# Patient Record
Sex: Female | Born: 1955 | Race: Black or African American | Hispanic: No | Smoking: Former smoker
Health system: Southern US, Community
[De-identification: ages and names within clinical notes are randomized; demographics above are authoritative.]

## PROBLEM LIST (undated history)

## (undated) DIAGNOSIS — I471 Supraventricular tachycardia, unspecified: Secondary | ICD-10-CM

## (undated) DIAGNOSIS — K219 Gastro-esophageal reflux disease without esophagitis: Secondary | ICD-10-CM

## (undated) DIAGNOSIS — D509 Iron deficiency anemia, unspecified: Secondary | ICD-10-CM

## (undated) DIAGNOSIS — F32A Depression, unspecified: Secondary | ICD-10-CM

## (undated) DIAGNOSIS — I872 Venous insufficiency (chronic) (peripheral): Secondary | ICD-10-CM

## (undated) DIAGNOSIS — F329 Major depressive disorder, single episode, unspecified: Secondary | ICD-10-CM

## (undated) DIAGNOSIS — I10 Essential (primary) hypertension: Secondary | ICD-10-CM

## (undated) DIAGNOSIS — Z8679 Personal history of other diseases of the circulatory system: Secondary | ICD-10-CM

## (undated) DIAGNOSIS — I639 Cerebral infarction, unspecified: Secondary | ICD-10-CM

## (undated) DIAGNOSIS — M8580 Other specified disorders of bone density and structure, unspecified site: Secondary | ICD-10-CM

## (undated) DIAGNOSIS — R569 Unspecified convulsions: Secondary | ICD-10-CM

## (undated) DIAGNOSIS — M329 Systemic lupus erythematosus, unspecified: Secondary | ICD-10-CM

## (undated) HISTORY — DX: Major depressive disorder, single episode, unspecified: F32.9

## (undated) HISTORY — DX: Unspecified convulsions: R56.9

## (undated) HISTORY — DX: Essential (primary) hypertension: I10

## (undated) HISTORY — DX: Depression, unspecified: F32.A

## (undated) HISTORY — DX: Cerebral infarction, unspecified: I63.9

## (undated) HISTORY — DX: Systemic lupus erythematosus, unspecified: M32.9

## (undated) HISTORY — PX: CEREBRAL ANEURYSM REPAIR: SHX164

## (undated) HISTORY — DX: Personal history of other diseases of the circulatory system: Z86.79

## (undated) HISTORY — DX: Gastro-esophageal reflux disease without esophagitis: K21.9

## (undated) HISTORY — DX: Iron deficiency anemia, unspecified: D50.9

## (undated) HISTORY — DX: Venous insufficiency (chronic) (peripheral): I87.2

## (undated) HISTORY — DX: Other specified disorders of bone density and structure, unspecified site: M85.80

---

## 1989-05-18 DIAGNOSIS — I639 Cerebral infarction, unspecified: Secondary | ICD-10-CM

## 1989-05-18 HISTORY — DX: Cerebral infarction, unspecified: I63.9

## 1997-12-21 ENCOUNTER — Encounter: Admission: RE | Admit: 1997-12-21 | Discharge: 1997-12-21 | Payer: Self-pay | Admitting: Internal Medicine

## 1997-12-21 ENCOUNTER — Other Ambulatory Visit: Admission: RE | Admit: 1997-12-21 | Discharge: 1997-12-21 | Payer: Self-pay | Admitting: Internal Medicine

## 1999-03-03 ENCOUNTER — Encounter: Admission: RE | Admit: 1999-03-03 | Discharge: 1999-03-03 | Payer: Self-pay | Admitting: Internal Medicine

## 1999-05-19 HISTORY — PX: ESOPHAGOGASTRODUODENOSCOPY: SHX1529

## 2000-03-23 ENCOUNTER — Emergency Department (HOSPITAL_COMMUNITY): Admission: EM | Admit: 2000-03-23 | Discharge: 2000-03-23 | Payer: Self-pay | Admitting: Emergency Medicine

## 2000-03-23 ENCOUNTER — Encounter: Payer: Self-pay | Admitting: Emergency Medicine

## 2000-04-08 ENCOUNTER — Encounter: Payer: Self-pay | Admitting: Emergency Medicine

## 2000-04-08 ENCOUNTER — Encounter (INDEPENDENT_AMBULATORY_CARE_PROVIDER_SITE_OTHER): Payer: Self-pay | Admitting: *Deleted

## 2000-04-09 ENCOUNTER — Inpatient Hospital Stay (HOSPITAL_COMMUNITY): Admission: EM | Admit: 2000-04-09 | Discharge: 2000-04-15 | Payer: Self-pay | Admitting: Emergency Medicine

## 2000-04-10 ENCOUNTER — Encounter: Payer: Self-pay | Admitting: Internal Medicine

## 2000-04-11 ENCOUNTER — Encounter: Payer: Self-pay | Admitting: Internal Medicine

## 2000-04-12 ENCOUNTER — Encounter: Payer: Self-pay | Admitting: Internal Medicine

## 2000-04-13 ENCOUNTER — Encounter: Payer: Self-pay | Admitting: Internal Medicine

## 2000-04-14 ENCOUNTER — Encounter: Payer: Self-pay | Admitting: Internal Medicine

## 2000-04-29 ENCOUNTER — Encounter (INDEPENDENT_AMBULATORY_CARE_PROVIDER_SITE_OTHER): Payer: Self-pay | Admitting: *Deleted

## 2000-04-29 ENCOUNTER — Encounter: Admission: RE | Admit: 2000-04-29 | Discharge: 2000-04-29 | Payer: Self-pay | Admitting: Hematology and Oncology

## 2000-04-29 ENCOUNTER — Inpatient Hospital Stay (HOSPITAL_COMMUNITY): Admission: RE | Admit: 2000-04-29 | Discharge: 2000-05-25 | Payer: Self-pay | Admitting: Hematology and Oncology

## 2000-05-02 ENCOUNTER — Encounter: Payer: Self-pay | Admitting: Pulmonary Disease

## 2000-05-02 ENCOUNTER — Encounter: Payer: Self-pay | Admitting: *Deleted

## 2000-05-04 ENCOUNTER — Encounter: Payer: Self-pay | Admitting: Thoracic Surgery

## 2000-05-09 ENCOUNTER — Encounter: Payer: Self-pay | Admitting: Cardiology

## 2000-05-10 ENCOUNTER — Encounter: Payer: Self-pay | Admitting: Thoracic Surgery

## 2000-05-18 ENCOUNTER — Encounter: Payer: Self-pay | Admitting: Critical Care Medicine

## 2000-05-20 ENCOUNTER — Encounter: Payer: Self-pay | Admitting: Gastroenterology

## 2000-06-02 ENCOUNTER — Encounter: Admission: RE | Admit: 2000-06-02 | Discharge: 2000-06-02 | Payer: Self-pay | Admitting: Internal Medicine

## 2000-06-16 ENCOUNTER — Encounter: Payer: Self-pay | Admitting: Thoracic Surgery

## 2000-06-16 ENCOUNTER — Encounter: Admission: RE | Admit: 2000-06-16 | Discharge: 2000-06-16 | Payer: Self-pay | Admitting: Thoracic Surgery

## 2000-06-18 ENCOUNTER — Encounter: Payer: Self-pay | Admitting: Infectious Diseases

## 2000-06-18 ENCOUNTER — Inpatient Hospital Stay (HOSPITAL_COMMUNITY): Admission: EM | Admit: 2000-06-18 | Discharge: 2000-06-19 | Payer: Self-pay | Admitting: *Deleted

## 2000-07-01 ENCOUNTER — Encounter: Admission: RE | Admit: 2000-07-01 | Discharge: 2000-07-01 | Payer: Self-pay

## 2000-07-28 ENCOUNTER — Encounter: Payer: Self-pay | Admitting: Thoracic Surgery

## 2000-07-28 ENCOUNTER — Encounter: Admission: RE | Admit: 2000-07-28 | Discharge: 2000-07-28 | Payer: Self-pay | Admitting: Thoracic Surgery

## 2001-04-13 ENCOUNTER — Encounter: Admission: RE | Admit: 2001-04-13 | Discharge: 2001-04-13 | Payer: Self-pay | Admitting: Internal Medicine

## 2001-04-27 ENCOUNTER — Encounter: Admission: RE | Admit: 2001-04-27 | Discharge: 2001-04-27 | Payer: Self-pay | Admitting: Internal Medicine

## 2001-04-29 ENCOUNTER — Encounter: Payer: Self-pay | Admitting: *Deleted

## 2001-04-29 ENCOUNTER — Ambulatory Visit (HOSPITAL_COMMUNITY): Admission: RE | Admit: 2001-04-29 | Discharge: 2001-04-29 | Payer: Self-pay | Admitting: *Deleted

## 2001-05-02 ENCOUNTER — Encounter: Admission: RE | Admit: 2001-05-02 | Discharge: 2001-05-02 | Payer: Self-pay | Admitting: Internal Medicine

## 2001-05-26 ENCOUNTER — Encounter: Admission: RE | Admit: 2001-05-26 | Discharge: 2001-05-26 | Payer: Self-pay | Admitting: Internal Medicine

## 2001-06-07 ENCOUNTER — Encounter: Admission: RE | Admit: 2001-06-07 | Discharge: 2001-06-07 | Payer: Self-pay | Admitting: Internal Medicine

## 2001-06-07 ENCOUNTER — Encounter: Payer: Self-pay | Admitting: Internal Medicine

## 2001-06-07 ENCOUNTER — Ambulatory Visit (HOSPITAL_COMMUNITY): Admission: RE | Admit: 2001-06-07 | Discharge: 2001-06-07 | Payer: Self-pay

## 2001-06-08 ENCOUNTER — Emergency Department (HOSPITAL_COMMUNITY): Admission: EM | Admit: 2001-06-08 | Discharge: 2001-06-08 | Payer: Self-pay | Admitting: Emergency Medicine

## 2001-06-13 ENCOUNTER — Encounter: Admission: RE | Admit: 2001-06-13 | Discharge: 2001-06-13 | Payer: Self-pay | Admitting: Internal Medicine

## 2001-07-13 ENCOUNTER — Ambulatory Visit (HOSPITAL_COMMUNITY): Admission: RE | Admit: 2001-07-13 | Discharge: 2001-07-13 | Payer: Self-pay | Admitting: Gastroenterology

## 2001-08-02 ENCOUNTER — Encounter: Admission: RE | Admit: 2001-08-02 | Discharge: 2001-08-02 | Payer: Self-pay | Admitting: Hematology & Oncology

## 2001-08-09 ENCOUNTER — Encounter: Admission: RE | Admit: 2001-08-09 | Discharge: 2001-08-09 | Payer: Self-pay | Admitting: Internal Medicine

## 2001-10-20 ENCOUNTER — Emergency Department (HOSPITAL_COMMUNITY): Admission: EM | Admit: 2001-10-20 | Discharge: 2001-10-20 | Payer: Self-pay | Admitting: Emergency Medicine

## 2001-10-24 ENCOUNTER — Encounter: Admission: RE | Admit: 2001-10-24 | Discharge: 2001-11-10 | Payer: Self-pay | Admitting: Internal Medicine

## 2001-11-08 ENCOUNTER — Encounter: Admission: RE | Admit: 2001-11-08 | Discharge: 2001-11-08 | Payer: Self-pay | Admitting: Internal Medicine

## 2002-08-07 ENCOUNTER — Ambulatory Visit (HOSPITAL_COMMUNITY): Admission: RE | Admit: 2002-08-07 | Discharge: 2002-08-07 | Payer: Self-pay | Admitting: Internal Medicine

## 2002-08-07 ENCOUNTER — Encounter: Admission: RE | Admit: 2002-08-07 | Discharge: 2002-08-07 | Payer: Self-pay | Admitting: Internal Medicine

## 2002-10-03 ENCOUNTER — Ambulatory Visit (HOSPITAL_COMMUNITY): Admission: RE | Admit: 2002-10-03 | Discharge: 2002-10-03 | Payer: Self-pay | Admitting: Internal Medicine

## 2002-10-03 ENCOUNTER — Encounter: Admission: RE | Admit: 2002-10-03 | Discharge: 2002-10-03 | Payer: Self-pay | Admitting: Internal Medicine

## 2003-03-29 ENCOUNTER — Encounter: Admission: RE | Admit: 2003-03-29 | Discharge: 2003-03-29 | Payer: Self-pay | Admitting: Internal Medicine

## 2003-07-18 ENCOUNTER — Emergency Department (HOSPITAL_COMMUNITY): Admission: EM | Admit: 2003-07-18 | Discharge: 2003-07-18 | Payer: Self-pay | Admitting: Emergency Medicine

## 2003-08-28 ENCOUNTER — Encounter: Admission: RE | Admit: 2003-08-28 | Discharge: 2003-08-28 | Payer: Self-pay | Admitting: Internal Medicine

## 2003-12-14 ENCOUNTER — Encounter: Admission: RE | Admit: 2003-12-14 | Discharge: 2003-12-14 | Payer: Self-pay | Admitting: Family Medicine

## 2003-12-14 ENCOUNTER — Encounter (INDEPENDENT_AMBULATORY_CARE_PROVIDER_SITE_OTHER): Payer: Self-pay | Admitting: Specialist

## 2003-12-26 ENCOUNTER — Ambulatory Visit (HOSPITAL_COMMUNITY): Admission: RE | Admit: 2003-12-26 | Discharge: 2003-12-26 | Payer: Self-pay | Admitting: Family Medicine

## 2004-04-01 ENCOUNTER — Ambulatory Visit: Payer: Self-pay | Admitting: Internal Medicine

## 2004-05-27 ENCOUNTER — Ambulatory Visit: Payer: Self-pay | Admitting: Internal Medicine

## 2004-06-12 ENCOUNTER — Encounter: Admission: RE | Admit: 2004-06-12 | Discharge: 2004-07-08 | Payer: Self-pay | Admitting: Internal Medicine

## 2004-08-13 ENCOUNTER — Ambulatory Visit: Payer: Self-pay | Admitting: Internal Medicine

## 2004-08-27 ENCOUNTER — Encounter: Admission: RE | Admit: 2004-08-27 | Discharge: 2004-09-11 | Payer: Self-pay | Admitting: Internal Medicine

## 2004-10-21 ENCOUNTER — Ambulatory Visit: Payer: Self-pay | Admitting: Internal Medicine

## 2004-10-22 ENCOUNTER — Emergency Department (HOSPITAL_COMMUNITY): Admission: EM | Admit: 2004-10-22 | Discharge: 2004-10-22 | Payer: Self-pay | Admitting: Emergency Medicine

## 2004-10-22 ENCOUNTER — Ambulatory Visit: Payer: Self-pay | Admitting: Internal Medicine

## 2004-10-24 ENCOUNTER — Ambulatory Visit: Payer: Self-pay | Admitting: Internal Medicine

## 2004-12-23 ENCOUNTER — Ambulatory Visit: Payer: Self-pay | Admitting: Internal Medicine

## 2005-03-10 ENCOUNTER — Ambulatory Visit: Payer: Self-pay | Admitting: Internal Medicine

## 2005-06-06 ENCOUNTER — Inpatient Hospital Stay (HOSPITAL_COMMUNITY): Admission: EM | Admit: 2005-06-06 | Discharge: 2005-06-09 | Payer: Self-pay | Admitting: *Deleted

## 2005-06-09 ENCOUNTER — Ambulatory Visit: Payer: Self-pay | Admitting: Internal Medicine

## 2005-06-16 ENCOUNTER — Ambulatory Visit: Payer: Self-pay | Admitting: Internal Medicine

## 2005-06-16 ENCOUNTER — Inpatient Hospital Stay (HOSPITAL_COMMUNITY): Admission: AD | Admit: 2005-06-16 | Discharge: 2005-06-22 | Payer: Self-pay | Admitting: Internal Medicine

## 2005-06-26 ENCOUNTER — Ambulatory Visit: Payer: Self-pay | Admitting: Internal Medicine

## 2005-07-22 ENCOUNTER — Ambulatory Visit: Payer: Self-pay | Admitting: Internal Medicine

## 2005-08-04 ENCOUNTER — Ambulatory Visit: Payer: Self-pay | Admitting: Internal Medicine

## 2005-08-26 ENCOUNTER — Ambulatory Visit: Payer: Self-pay | Admitting: Internal Medicine

## 2005-10-20 ENCOUNTER — Ambulatory Visit: Payer: Self-pay | Admitting: Internal Medicine

## 2005-11-25 ENCOUNTER — Ambulatory Visit: Payer: Self-pay | Admitting: Internal Medicine

## 2005-11-25 ENCOUNTER — Ambulatory Visit (HOSPITAL_COMMUNITY): Admission: RE | Admit: 2005-11-25 | Discharge: 2005-11-25 | Payer: Self-pay | Admitting: Internal Medicine

## 2005-12-29 ENCOUNTER — Encounter (INDEPENDENT_AMBULATORY_CARE_PROVIDER_SITE_OTHER): Payer: Self-pay | Admitting: Specialist

## 2005-12-29 ENCOUNTER — Ambulatory Visit: Payer: Self-pay | Admitting: Internal Medicine

## 2005-12-29 LAB — CONVERTED CEMR LAB

## 2006-02-10 ENCOUNTER — Encounter (INDEPENDENT_AMBULATORY_CARE_PROVIDER_SITE_OTHER): Payer: Self-pay | Admitting: Internal Medicine

## 2006-03-02 DIAGNOSIS — I309 Acute pericarditis, unspecified: Secondary | ICD-10-CM | POA: Insufficient documentation

## 2006-03-02 DIAGNOSIS — N920 Excessive and frequent menstruation with regular cycle: Secondary | ICD-10-CM | POA: Insufficient documentation

## 2006-03-02 DIAGNOSIS — M329 Systemic lupus erythematosus, unspecified: Secondary | ICD-10-CM

## 2006-03-02 DIAGNOSIS — I639 Cerebral infarction, unspecified: Secondary | ICD-10-CM | POA: Insufficient documentation

## 2006-03-02 DIAGNOSIS — K219 Gastro-esophageal reflux disease without esophagitis: Secondary | ICD-10-CM

## 2006-03-02 DIAGNOSIS — D509 Iron deficiency anemia, unspecified: Secondary | ICD-10-CM | POA: Insufficient documentation

## 2006-03-02 DIAGNOSIS — I10 Essential (primary) hypertension: Secondary | ICD-10-CM

## 2006-03-02 DIAGNOSIS — Z8679 Personal history of other diseases of the circulatory system: Secondary | ICD-10-CM

## 2006-03-02 DIAGNOSIS — R569 Unspecified convulsions: Secondary | ICD-10-CM

## 2006-03-02 DIAGNOSIS — F329 Major depressive disorder, single episode, unspecified: Secondary | ICD-10-CM

## 2006-03-02 HISTORY — DX: Iron deficiency anemia, unspecified: D50.9

## 2006-03-02 HISTORY — DX: Personal history of other diseases of the circulatory system: Z86.79

## 2006-03-04 ENCOUNTER — Ambulatory Visit: Payer: Self-pay | Admitting: Internal Medicine

## 2006-03-12 ENCOUNTER — Ambulatory Visit: Payer: Self-pay | Admitting: Internal Medicine

## 2006-03-12 ENCOUNTER — Ambulatory Visit (HOSPITAL_COMMUNITY): Admission: RE | Admit: 2006-03-12 | Discharge: 2006-03-12 | Payer: Self-pay | Admitting: Internal Medicine

## 2006-04-14 ENCOUNTER — Emergency Department (HOSPITAL_COMMUNITY): Admission: EM | Admit: 2006-04-14 | Discharge: 2006-04-14 | Payer: Self-pay | Admitting: Emergency Medicine

## 2006-04-22 ENCOUNTER — Ambulatory Visit: Payer: Self-pay | Admitting: Internal Medicine

## 2006-06-10 ENCOUNTER — Emergency Department (HOSPITAL_COMMUNITY): Admission: EM | Admit: 2006-06-10 | Discharge: 2006-06-10 | Payer: Self-pay | Admitting: Emergency Medicine

## 2006-06-22 ENCOUNTER — Telehealth: Payer: Self-pay | Admitting: *Deleted

## 2006-06-29 ENCOUNTER — Telehealth: Payer: Self-pay | Admitting: *Deleted

## 2006-07-13 ENCOUNTER — Ambulatory Visit: Payer: Self-pay | Admitting: Internal Medicine

## 2006-07-13 DIAGNOSIS — M79609 Pain in unspecified limb: Secondary | ICD-10-CM

## 2006-07-13 DIAGNOSIS — J45909 Unspecified asthma, uncomplicated: Secondary | ICD-10-CM | POA: Insufficient documentation

## 2006-07-13 LAB — CONVERTED CEMR LAB
ALT: 16 units/L (ref 0–35)
Albumin: 3.4 g/dL — ABNORMAL LOW (ref 3.5–5.2)
CO2: 28 meq/L (ref 19–32)
Chloride: 106 meq/L (ref 96–112)
Glucose, Bld: 93 mg/dL (ref 70–99)
Platelets: 466 10*3/uL — ABNORMAL HIGH (ref 150–400)
Potassium: 4.1 meq/L (ref 3.5–5.3)
RBC: 5.19 M/uL — ABNORMAL HIGH (ref 3.87–5.11)
Sodium: 140 meq/L (ref 135–145)
Total Bilirubin: 0.5 mg/dL (ref 0.3–1.2)
Total Protein: 7.4 g/dL (ref 6.0–8.3)
WBC: 5.4 10*3/uL (ref 4.0–10.5)

## 2006-07-15 ENCOUNTER — Encounter (INDEPENDENT_AMBULATORY_CARE_PROVIDER_SITE_OTHER): Payer: Self-pay | Admitting: Internal Medicine

## 2006-07-20 ENCOUNTER — Telehealth (INDEPENDENT_AMBULATORY_CARE_PROVIDER_SITE_OTHER): Payer: Self-pay | Admitting: Internal Medicine

## 2006-09-01 ENCOUNTER — Ambulatory Visit: Payer: Self-pay | Admitting: Internal Medicine

## 2006-09-03 LAB — CONVERTED CEMR LAB
CO2: 25 meq/L (ref 19–32)
Calcium: 9.4 mg/dL (ref 8.4–10.5)
Ferritin: 9 ng/mL — ABNORMAL LOW (ref 10–291)
Glucose, Bld: 94 mg/dL (ref 70–99)
MCHC: 28.7 g/dL — ABNORMAL LOW (ref 30.0–36.0)
MCV: 73.6 fL — ABNORMAL LOW (ref 78.0–100.0)
Platelets: 367 10*3/uL (ref 150–400)
Potassium: 4.6 meq/L (ref 3.5–5.3)
RBC: 5.07 M/uL (ref 3.87–5.11)
Sodium: 140 meq/L (ref 135–145)
WBC: 4.8 10*3/uL (ref 4.0–10.5)

## 2006-09-08 ENCOUNTER — Ambulatory Visit: Payer: Self-pay | Admitting: Internal Medicine

## 2006-09-09 ENCOUNTER — Ambulatory Visit (HOSPITAL_COMMUNITY): Admission: RE | Admit: 2006-09-09 | Discharge: 2006-09-09 | Payer: Self-pay | Admitting: Internal Medicine

## 2006-09-09 ENCOUNTER — Encounter (INDEPENDENT_AMBULATORY_CARE_PROVIDER_SITE_OTHER): Payer: Self-pay | Admitting: *Deleted

## 2006-09-22 ENCOUNTER — Encounter (INDEPENDENT_AMBULATORY_CARE_PROVIDER_SITE_OTHER): Payer: Self-pay | Admitting: Internal Medicine

## 2006-09-23 LAB — CONVERTED CEMR LAB
CO2: 26 meq/L (ref 19–32)
Chloride: 103 meq/L (ref 96–112)
Potassium: 4.3 meq/L (ref 3.5–5.3)

## 2006-09-28 ENCOUNTER — Telehealth (INDEPENDENT_AMBULATORY_CARE_PROVIDER_SITE_OTHER): Payer: Self-pay | Admitting: *Deleted

## 2006-10-05 ENCOUNTER — Ambulatory Visit: Payer: Self-pay | Admitting: Gastroenterology

## 2006-10-12 ENCOUNTER — Encounter (INDEPENDENT_AMBULATORY_CARE_PROVIDER_SITE_OTHER): Payer: Self-pay | Admitting: Internal Medicine

## 2006-10-25 ENCOUNTER — Ambulatory Visit (HOSPITAL_COMMUNITY): Admission: RE | Admit: 2006-10-25 | Discharge: 2006-10-25 | Payer: Self-pay | Admitting: Gastroenterology

## 2006-10-25 ENCOUNTER — Encounter (INDEPENDENT_AMBULATORY_CARE_PROVIDER_SITE_OTHER): Payer: Self-pay | Admitting: Internal Medicine

## 2006-10-25 ENCOUNTER — Telehealth: Payer: Self-pay | Admitting: *Deleted

## 2006-11-05 ENCOUNTER — Ambulatory Visit: Payer: Self-pay | Admitting: Gastroenterology

## 2006-11-22 ENCOUNTER — Telehealth (INDEPENDENT_AMBULATORY_CARE_PROVIDER_SITE_OTHER): Payer: Self-pay | Admitting: *Deleted

## 2006-11-30 ENCOUNTER — Encounter (INDEPENDENT_AMBULATORY_CARE_PROVIDER_SITE_OTHER): Payer: Self-pay | Admitting: Internal Medicine

## 2006-12-13 ENCOUNTER — Telehealth (INDEPENDENT_AMBULATORY_CARE_PROVIDER_SITE_OTHER): Payer: Self-pay | Admitting: *Deleted

## 2006-12-13 ENCOUNTER — Encounter (INDEPENDENT_AMBULATORY_CARE_PROVIDER_SITE_OTHER): Payer: Self-pay | Admitting: *Deleted

## 2006-12-13 ENCOUNTER — Ambulatory Visit: Payer: Self-pay | Admitting: *Deleted

## 2006-12-13 DIAGNOSIS — R112 Nausea with vomiting, unspecified: Secondary | ICD-10-CM

## 2006-12-13 LAB — CONVERTED CEMR LAB
AST: 13 units/L (ref 0–37)
Alkaline Phosphatase: 69 units/L (ref 39–117)
BUN: 12 mg/dL (ref 6–23)
Basophils Absolute: 0 10*3/uL (ref 0.0–0.1)
Basophils Relative: 0 % (ref 0–1)
Calcium: 8.8 mg/dL (ref 8.4–10.5)
Creatinine, Ser: 0.67 mg/dL (ref 0.40–1.20)
Eosinophils Absolute: 0.1 10*3/uL (ref 0.0–0.7)
Eosinophils Relative: 3 % (ref 0–5)
Hemoglobin: 10.5 g/dL — ABNORMAL LOW (ref 12.0–15.0)
MCHC: 31.1 g/dL (ref 30.0–36.0)
MCV: 71.3 fL — ABNORMAL LOW (ref 78.0–100.0)
Monocytes Absolute: 0.2 10*3/uL (ref 0.2–0.7)
Monocytes Relative: 4 % (ref 3–11)
RBC: 4.75 M/uL (ref 3.87–5.11)
RDW: 17.1 % — ABNORMAL HIGH (ref 11.5–14.0)
Total Bilirubin: 0.4 mg/dL (ref 0.3–1.2)

## 2006-12-14 ENCOUNTER — Encounter (INDEPENDENT_AMBULATORY_CARE_PROVIDER_SITE_OTHER): Payer: Self-pay | Admitting: *Deleted

## 2006-12-14 ENCOUNTER — Ambulatory Visit: Payer: Self-pay | Admitting: Internal Medicine

## 2006-12-14 ENCOUNTER — Inpatient Hospital Stay (HOSPITAL_COMMUNITY): Admission: AD | Admit: 2006-12-14 | Discharge: 2006-12-16 | Payer: Self-pay | Admitting: Internal Medicine

## 2006-12-14 LAB — CONVERTED CEMR LAB
Specific Gravity, Urine: 1.028 (ref 1.005–1.03)
Urine Glucose: NEGATIVE mg/dL
Urobilinogen, UA: 1 (ref 0.0–1.0)
pH: 6.5 (ref 5.0–8.0)

## 2006-12-29 ENCOUNTER — Encounter (INDEPENDENT_AMBULATORY_CARE_PROVIDER_SITE_OTHER): Payer: Self-pay | Admitting: Internal Medicine

## 2006-12-29 ENCOUNTER — Ambulatory Visit: Payer: Self-pay | Admitting: Infectious Disease

## 2006-12-29 ENCOUNTER — Telehealth: Payer: Self-pay | Admitting: Infectious Disease

## 2006-12-30 ENCOUNTER — Telehealth: Payer: Self-pay | Admitting: *Deleted

## 2006-12-30 ENCOUNTER — Telehealth (INDEPENDENT_AMBULATORY_CARE_PROVIDER_SITE_OTHER): Payer: Self-pay | Admitting: Internal Medicine

## 2007-01-04 ENCOUNTER — Ambulatory Visit: Payer: Self-pay | Admitting: Internal Medicine

## 2007-01-06 ENCOUNTER — Encounter (INDEPENDENT_AMBULATORY_CARE_PROVIDER_SITE_OTHER): Payer: Self-pay | Admitting: Internal Medicine

## 2007-02-08 ENCOUNTER — Encounter (INDEPENDENT_AMBULATORY_CARE_PROVIDER_SITE_OTHER): Payer: Self-pay | Admitting: Internal Medicine

## 2007-02-23 ENCOUNTER — Telehealth: Payer: Self-pay | Admitting: *Deleted

## 2007-02-28 ENCOUNTER — Telehealth: Payer: Self-pay | Admitting: *Deleted

## 2007-03-08 ENCOUNTER — Encounter (INDEPENDENT_AMBULATORY_CARE_PROVIDER_SITE_OTHER): Payer: Self-pay | Admitting: Internal Medicine

## 2007-03-09 ENCOUNTER — Telehealth: Payer: Self-pay | Admitting: Licensed Clinical Social Worker

## 2007-03-10 ENCOUNTER — Encounter (INDEPENDENT_AMBULATORY_CARE_PROVIDER_SITE_OTHER): Payer: Self-pay | Admitting: Internal Medicine

## 2007-03-14 ENCOUNTER — Encounter (INDEPENDENT_AMBULATORY_CARE_PROVIDER_SITE_OTHER): Payer: Self-pay | Admitting: Internal Medicine

## 2007-03-22 ENCOUNTER — Telehealth: Payer: Self-pay | Admitting: *Deleted

## 2007-03-23 ENCOUNTER — Ambulatory Visit: Payer: Self-pay | Admitting: *Deleted

## 2007-03-28 ENCOUNTER — Telehealth: Payer: Self-pay | Admitting: *Deleted

## 2007-04-05 ENCOUNTER — Encounter (INDEPENDENT_AMBULATORY_CARE_PROVIDER_SITE_OTHER): Payer: Self-pay | Admitting: Internal Medicine

## 2007-04-20 ENCOUNTER — Ambulatory Visit: Payer: Self-pay | Admitting: Internal Medicine

## 2007-04-20 ENCOUNTER — Encounter (INDEPENDENT_AMBULATORY_CARE_PROVIDER_SITE_OTHER): Payer: Self-pay | Admitting: Internal Medicine

## 2007-04-21 LAB — CONVERTED CEMR LAB
ALT: 12 units/L (ref 0–35)
AST: 14 units/L (ref 0–37)
Alkaline Phosphatase: 81 units/L (ref 39–117)
BUN: 13 mg/dL (ref 6–23)
Basophils Absolute: 0.1 10*3/uL (ref 0.0–0.1)
Basophils Relative: 1 % (ref 0–1)
Basophils Relative: 1 % (ref 0–1)
Calcium: 9.2 mg/dL (ref 8.4–10.5)
Creatinine, Ser: 0.72 mg/dL (ref 0.40–1.20)
Eosinophils Absolute: 0.1 10*3/uL — ABNORMAL LOW (ref 0.2–0.7)
Eosinophils Relative: 3 % (ref 0–5)
Eosinophils Relative: 5 % (ref 0–5)
HCT: 34 % — ABNORMAL LOW (ref 36.0–46.0)
HCT: 40 % (ref 36.0–46.0)
Hemoglobin: 11.7 g/dL — ABNORMAL LOW (ref 12.0–15.0)
Lymphocytes Relative: 43 % (ref 12–46)
MCHC: 29.3 g/dL — ABNORMAL LOW (ref 30.0–36.0)
MCV: 72.2 fL — ABNORMAL LOW (ref 78.0–100.0)
Monocytes Absolute: 0.3 10*3/uL (ref 0.1–1.0)
Monocytes Relative: 7 % (ref 3–12)
Neutro Abs: 2.3 10*3/uL (ref 1.7–7.7)
Neutrophils Relative %: 44 % (ref 43–77)
Platelets: 418 10*3/uL — ABNORMAL HIGH (ref 150–400)
RBC: 5.54 M/uL — ABNORMAL HIGH (ref 3.87–5.11)
RDW: 16.6 % — ABNORMAL HIGH (ref 11.5–14.0)
RDW: 16.9 % — ABNORMAL HIGH (ref 11.5–15.5)
Total Bilirubin: 0.3 mg/dL (ref 0.3–1.2)

## 2007-04-29 ENCOUNTER — Telehealth: Payer: Self-pay | Admitting: *Deleted

## 2007-05-02 ENCOUNTER — Telehealth: Payer: Self-pay | Admitting: *Deleted

## 2007-05-10 ENCOUNTER — Encounter (INDEPENDENT_AMBULATORY_CARE_PROVIDER_SITE_OTHER): Payer: Self-pay | Admitting: Infectious Diseases

## 2007-05-10 ENCOUNTER — Ambulatory Visit: Payer: Self-pay | Admitting: Internal Medicine

## 2007-05-16 LAB — CONVERTED CEMR LAB
BUN: 19 mg/dL (ref 6–23)
Calcium: 9.2 mg/dL (ref 8.4–10.5)
Chloride: 99 meq/L (ref 96–112)
Creatinine, Ser: 0.8 mg/dL (ref 0.40–1.20)
HCT: 37.5 % (ref 36.0–46.0)
Hemoglobin: 10.9 g/dL — ABNORMAL LOW (ref 12.0–15.0)
MCV: 73.7 fL — ABNORMAL LOW (ref 78.0–100.0)
Platelets: 485 10*3/uL — ABNORMAL HIGH (ref 150–400)
RDW: 17.8 % — ABNORMAL HIGH (ref 11.5–15.5)

## 2007-05-20 ENCOUNTER — Ambulatory Visit (HOSPITAL_COMMUNITY): Admission: RE | Admit: 2007-05-20 | Discharge: 2007-05-20 | Payer: Self-pay | Admitting: Hospitalist

## 2007-05-31 ENCOUNTER — Telehealth: Payer: Self-pay | Admitting: *Deleted

## 2007-06-01 ENCOUNTER — Encounter (INDEPENDENT_AMBULATORY_CARE_PROVIDER_SITE_OTHER): Payer: Self-pay | Admitting: Internal Medicine

## 2007-06-01 ENCOUNTER — Encounter: Admission: RE | Admit: 2007-06-01 | Discharge: 2007-08-30 | Payer: Self-pay | Admitting: Infectious Diseases

## 2007-06-23 ENCOUNTER — Ambulatory Visit: Payer: Self-pay | Admitting: Hospitalist

## 2007-06-23 ENCOUNTER — Encounter (INDEPENDENT_AMBULATORY_CARE_PROVIDER_SITE_OTHER): Payer: Self-pay | Admitting: Internal Medicine

## 2007-06-23 LAB — CONVERTED CEMR LAB
AST: 14 units/L (ref 0–37)
Albumin: 4.4 g/dL (ref 3.5–5.2)
Alkaline Phosphatase: 91 units/L (ref 39–117)
Calcium: 9.6 mg/dL (ref 8.4–10.5)
Chloride: 100 meq/L (ref 96–112)
Glucose, Bld: 98 mg/dL (ref 70–99)
Hemoglobin: 11.1 g/dL — ABNORMAL LOW (ref 12.0–15.0)
MCHC: 28.7 g/dL — ABNORMAL LOW (ref 30.0–36.0)
Potassium: 3.8 meq/L (ref 3.5–5.3)
RBC: 5.11 M/uL (ref 3.87–5.11)
RDW: 18 % — ABNORMAL HIGH (ref 11.5–15.5)
Sodium: 139 meq/L (ref 135–145)
Total Protein: 8.3 g/dL (ref 6.0–8.3)

## 2007-06-28 ENCOUNTER — Telehealth (INDEPENDENT_AMBULATORY_CARE_PROVIDER_SITE_OTHER): Payer: Self-pay | Admitting: *Deleted

## 2007-07-04 ENCOUNTER — Telehealth: Payer: Self-pay | Admitting: *Deleted

## 2007-07-04 ENCOUNTER — Encounter (INDEPENDENT_AMBULATORY_CARE_PROVIDER_SITE_OTHER): Payer: Self-pay | Admitting: Internal Medicine

## 2007-07-06 ENCOUNTER — Encounter (INDEPENDENT_AMBULATORY_CARE_PROVIDER_SITE_OTHER): Payer: Self-pay | Admitting: Internal Medicine

## 2007-07-13 ENCOUNTER — Encounter (INDEPENDENT_AMBULATORY_CARE_PROVIDER_SITE_OTHER): Payer: Self-pay | Admitting: Internal Medicine

## 2007-07-22 ENCOUNTER — Ambulatory Visit (HOSPITAL_COMMUNITY): Admission: RE | Admit: 2007-07-22 | Discharge: 2007-07-22 | Payer: Self-pay | Admitting: Internal Medicine

## 2007-07-22 ENCOUNTER — Encounter (INDEPENDENT_AMBULATORY_CARE_PROVIDER_SITE_OTHER): Payer: Self-pay | Admitting: Internal Medicine

## 2007-07-22 ENCOUNTER — Ambulatory Visit: Payer: Self-pay | Admitting: Vascular Surgery

## 2007-07-26 ENCOUNTER — Ambulatory Visit: Payer: Self-pay | Admitting: Internal Medicine

## 2007-07-26 LAB — CONVERTED CEMR LAB
Ferritin: 10 ng/mL (ref 10–291)
MCHC: 31.4 g/dL (ref 30.0–36.0)
MCV: 73.8 fL — ABNORMAL LOW (ref 78.0–100.0)

## 2007-07-27 ENCOUNTER — Encounter (INDEPENDENT_AMBULATORY_CARE_PROVIDER_SITE_OTHER): Payer: Self-pay | Admitting: Internal Medicine

## 2007-08-04 ENCOUNTER — Encounter (INDEPENDENT_AMBULATORY_CARE_PROVIDER_SITE_OTHER): Payer: Self-pay | Admitting: Internal Medicine

## 2007-08-23 ENCOUNTER — Ambulatory Visit (HOSPITAL_COMMUNITY): Admission: RE | Admit: 2007-08-23 | Discharge: 2007-08-23 | Payer: Self-pay | Admitting: Obstetrics

## 2007-08-26 ENCOUNTER — Encounter (INDEPENDENT_AMBULATORY_CARE_PROVIDER_SITE_OTHER): Payer: Self-pay | Admitting: Internal Medicine

## 2007-08-29 ENCOUNTER — Telehealth: Payer: Self-pay | Admitting: *Deleted

## 2007-09-05 ENCOUNTER — Encounter: Payer: Self-pay | Admitting: Internal Medicine

## 2007-09-05 ENCOUNTER — Encounter: Admission: RE | Admit: 2007-09-05 | Discharge: 2007-09-05 | Payer: Self-pay | Admitting: Obstetrics

## 2007-09-14 ENCOUNTER — Encounter (INDEPENDENT_AMBULATORY_CARE_PROVIDER_SITE_OTHER): Payer: Self-pay | Admitting: Internal Medicine

## 2007-09-16 ENCOUNTER — Encounter (INDEPENDENT_AMBULATORY_CARE_PROVIDER_SITE_OTHER): Payer: Self-pay | Admitting: Internal Medicine

## 2007-09-16 HISTORY — PX: TOTAL ABDOMINAL HYSTERECTOMY: SHX209

## 2007-09-19 ENCOUNTER — Encounter (INDEPENDENT_AMBULATORY_CARE_PROVIDER_SITE_OTHER): Payer: Self-pay | Admitting: Internal Medicine

## 2007-09-24 ENCOUNTER — Encounter (INDEPENDENT_AMBULATORY_CARE_PROVIDER_SITE_OTHER): Payer: Self-pay | Admitting: Internal Medicine

## 2007-10-07 ENCOUNTER — Encounter: Payer: Self-pay | Admitting: Obstetrics

## 2007-10-07 ENCOUNTER — Inpatient Hospital Stay (HOSPITAL_COMMUNITY): Admission: RE | Admit: 2007-10-07 | Discharge: 2007-10-12 | Payer: Self-pay | Admitting: Obstetrics

## 2007-10-24 ENCOUNTER — Telehealth: Payer: Self-pay | Admitting: *Deleted

## 2007-10-28 ENCOUNTER — Encounter (INDEPENDENT_AMBULATORY_CARE_PROVIDER_SITE_OTHER): Payer: Self-pay | Admitting: Internal Medicine

## 2007-11-01 ENCOUNTER — Ambulatory Visit: Payer: Self-pay | Admitting: Internal Medicine

## 2007-11-03 ENCOUNTER — Telehealth: Payer: Self-pay | Admitting: *Deleted

## 2007-11-08 ENCOUNTER — Ambulatory Visit (HOSPITAL_COMMUNITY): Admission: RE | Admit: 2007-11-08 | Discharge: 2007-11-08 | Payer: Self-pay | Admitting: Internal Medicine

## 2007-11-08 ENCOUNTER — Ambulatory Visit: Payer: Self-pay | Admitting: Internal Medicine

## 2007-11-08 DIAGNOSIS — S0000XA Unspecified superficial injury of scalp, initial encounter: Secondary | ICD-10-CM

## 2007-11-08 DIAGNOSIS — S1090XA Unspecified superficial injury of unspecified part of neck, initial encounter: Secondary | ICD-10-CM

## 2007-11-08 DIAGNOSIS — S0080XA Unspecified superficial injury of other part of head, initial encounter: Secondary | ICD-10-CM

## 2007-11-10 ENCOUNTER — Telehealth: Payer: Self-pay | Admitting: *Deleted

## 2007-11-25 ENCOUNTER — Encounter (INDEPENDENT_AMBULATORY_CARE_PROVIDER_SITE_OTHER): Payer: Self-pay | Admitting: Internal Medicine

## 2007-11-29 ENCOUNTER — Telehealth (INDEPENDENT_AMBULATORY_CARE_PROVIDER_SITE_OTHER): Payer: Self-pay | Admitting: Internal Medicine

## 2007-12-09 ENCOUNTER — Encounter (INDEPENDENT_AMBULATORY_CARE_PROVIDER_SITE_OTHER): Payer: Self-pay | Admitting: Internal Medicine

## 2007-12-23 ENCOUNTER — Telehealth (INDEPENDENT_AMBULATORY_CARE_PROVIDER_SITE_OTHER): Payer: Self-pay | Admitting: Internal Medicine

## 2008-01-13 ENCOUNTER — Encounter (INDEPENDENT_AMBULATORY_CARE_PROVIDER_SITE_OTHER): Payer: Self-pay | Admitting: Internal Medicine

## 2008-01-17 ENCOUNTER — Telehealth (INDEPENDENT_AMBULATORY_CARE_PROVIDER_SITE_OTHER): Payer: Self-pay | Admitting: Internal Medicine

## 2008-01-25 ENCOUNTER — Ambulatory Visit: Payer: Self-pay | Admitting: Internal Medicine

## 2008-01-25 LAB — CONVERTED CEMR LAB
BUN: 5 mg/dL — ABNORMAL LOW (ref 6–23)
Ferritin: 21 ng/mL (ref 10–291)
MCHC: 32.7 g/dL (ref 30.0–36.0)
Platelets: 541 10*3/uL — ABNORMAL HIGH (ref 150–400)
Potassium: 3.6 meq/L (ref 3.5–5.3)
RDW: 16.9 % — ABNORMAL HIGH (ref 11.5–15.5)
Sodium: 134 meq/L — ABNORMAL LOW (ref 135–145)

## 2008-01-31 ENCOUNTER — Encounter (INDEPENDENT_AMBULATORY_CARE_PROVIDER_SITE_OTHER): Payer: Self-pay | Admitting: Internal Medicine

## 2008-02-15 ENCOUNTER — Encounter (INDEPENDENT_AMBULATORY_CARE_PROVIDER_SITE_OTHER): Payer: Self-pay | Admitting: Internal Medicine

## 2008-02-22 ENCOUNTER — Telehealth: Payer: Self-pay | Admitting: *Deleted

## 2008-02-27 ENCOUNTER — Telehealth (INDEPENDENT_AMBULATORY_CARE_PROVIDER_SITE_OTHER): Payer: Self-pay | Admitting: Internal Medicine

## 2008-03-13 ENCOUNTER — Encounter (INDEPENDENT_AMBULATORY_CARE_PROVIDER_SITE_OTHER): Payer: Self-pay | Admitting: Internal Medicine

## 2008-03-15 ENCOUNTER — Ambulatory Visit: Payer: Self-pay | Admitting: Internal Medicine

## 2008-03-26 ENCOUNTER — Telehealth: Payer: Self-pay | Admitting: *Deleted

## 2008-04-17 ENCOUNTER — Encounter (INDEPENDENT_AMBULATORY_CARE_PROVIDER_SITE_OTHER): Payer: Self-pay | Admitting: Internal Medicine

## 2008-04-23 ENCOUNTER — Telehealth: Payer: Self-pay | Admitting: *Deleted

## 2008-05-21 ENCOUNTER — Encounter (INDEPENDENT_AMBULATORY_CARE_PROVIDER_SITE_OTHER): Payer: Self-pay | Admitting: Internal Medicine

## 2008-05-22 ENCOUNTER — Telehealth: Payer: Self-pay | Admitting: *Deleted

## 2008-06-07 ENCOUNTER — Encounter (INDEPENDENT_AMBULATORY_CARE_PROVIDER_SITE_OTHER): Payer: Self-pay | Admitting: Internal Medicine

## 2008-06-21 ENCOUNTER — Ambulatory Visit: Payer: Self-pay | Admitting: Internal Medicine

## 2008-06-22 LAB — CONVERTED CEMR LAB
HCT: 38.4 % (ref 36.0–46.0)
Hemoglobin: 12.4 g/dL (ref 12.0–15.0)
Platelets: 468 10*3/uL — ABNORMAL HIGH (ref 150–400)
RBC: 4.93 M/uL (ref 3.87–5.11)
WBC: 4.8 10*3/uL (ref 4.0–10.5)

## 2008-06-27 ENCOUNTER — Telehealth: Payer: Self-pay | Admitting: *Deleted

## 2008-07-05 ENCOUNTER — Encounter (INDEPENDENT_AMBULATORY_CARE_PROVIDER_SITE_OTHER): Payer: Self-pay | Admitting: Internal Medicine

## 2008-07-09 ENCOUNTER — Telehealth: Payer: Self-pay | Admitting: *Deleted

## 2008-08-01 ENCOUNTER — Encounter (INDEPENDENT_AMBULATORY_CARE_PROVIDER_SITE_OTHER): Payer: Self-pay | Admitting: Internal Medicine

## 2008-08-01 ENCOUNTER — Ambulatory Visit: Payer: Self-pay | Admitting: Internal Medicine

## 2008-08-01 DIAGNOSIS — M948X9 Other specified disorders of cartilage, unspecified sites: Secondary | ICD-10-CM

## 2008-08-21 ENCOUNTER — Telehealth: Payer: Self-pay | Admitting: *Deleted

## 2008-09-05 ENCOUNTER — Encounter (INDEPENDENT_AMBULATORY_CARE_PROVIDER_SITE_OTHER): Payer: Self-pay | Admitting: Internal Medicine

## 2008-09-13 ENCOUNTER — Encounter (INDEPENDENT_AMBULATORY_CARE_PROVIDER_SITE_OTHER): Payer: Self-pay | Admitting: Internal Medicine

## 2008-09-14 ENCOUNTER — Telehealth (INDEPENDENT_AMBULATORY_CARE_PROVIDER_SITE_OTHER): Payer: Self-pay | Admitting: Internal Medicine

## 2008-09-18 ENCOUNTER — Encounter (INDEPENDENT_AMBULATORY_CARE_PROVIDER_SITE_OTHER): Payer: Self-pay | Admitting: Internal Medicine

## 2008-09-18 ENCOUNTER — Ambulatory Visit (HOSPITAL_COMMUNITY): Admission: RE | Admit: 2008-09-18 | Discharge: 2008-09-18 | Payer: Self-pay | Admitting: Internal Medicine

## 2008-09-18 ENCOUNTER — Ambulatory Visit: Payer: Self-pay | Admitting: Internal Medicine

## 2008-09-18 LAB — CONVERTED CEMR LAB
BUN: 12 mg/dL (ref 6–23)
CO2: 30 meq/L (ref 19–32)
Chloride: 99 meq/L (ref 96–112)
GFR calc non Af Amer: >60 mL/min (ref >60)
HCT: 38.4 % (ref 36.0–46.0)
Hemoglobin: 12.7 g/dL (ref 12.0–15.0)
Potassium: 4 meq/L (ref 3.5–5.3)
RDW: 16.4 % — ABNORMAL HIGH (ref 11.5–15.5)

## 2008-10-17 ENCOUNTER — Telehealth: Payer: Self-pay | Admitting: *Deleted

## 2008-10-23 ENCOUNTER — Telehealth (INDEPENDENT_AMBULATORY_CARE_PROVIDER_SITE_OTHER): Payer: Self-pay | Admitting: Internal Medicine

## 2008-10-25 ENCOUNTER — Telehealth (INDEPENDENT_AMBULATORY_CARE_PROVIDER_SITE_OTHER): Payer: Self-pay | Admitting: Internal Medicine

## 2008-10-29 ENCOUNTER — Ambulatory Visit: Payer: Self-pay | Admitting: Internal Medicine

## 2008-11-05 ENCOUNTER — Encounter (INDEPENDENT_AMBULATORY_CARE_PROVIDER_SITE_OTHER): Payer: Self-pay | Admitting: Internal Medicine

## 2008-11-20 ENCOUNTER — Telehealth: Payer: Self-pay | Admitting: *Deleted

## 2008-11-26 ENCOUNTER — Telehealth: Payer: Self-pay | Admitting: *Deleted

## 2008-12-25 ENCOUNTER — Encounter (INDEPENDENT_AMBULATORY_CARE_PROVIDER_SITE_OTHER): Payer: Self-pay | Admitting: Internal Medicine

## 2008-12-25 ENCOUNTER — Ambulatory Visit: Payer: Self-pay | Admitting: Surgery

## 2008-12-25 ENCOUNTER — Ambulatory Visit (HOSPITAL_COMMUNITY): Admission: RE | Admit: 2008-12-25 | Discharge: 2008-12-25 | Payer: Self-pay | Admitting: Internal Medicine

## 2008-12-25 ENCOUNTER — Ambulatory Visit: Payer: Self-pay | Admitting: Internal Medicine

## 2008-12-25 DIAGNOSIS — R600 Localized edema: Secondary | ICD-10-CM | POA: Insufficient documentation

## 2008-12-25 LAB — CONVERTED CEMR LAB
Bilirubin Urine: NEGATIVE
Ketones, ur: NEGATIVE mg/dL
Specific Gravity, Urine: 1.016 (ref 1.005–1.0)
Urobilinogen, UA: 0.2 (ref 0.0–1.0)
pH: 6 (ref 5.0–8.0)

## 2008-12-28 ENCOUNTER — Telehealth: Payer: Self-pay | Admitting: *Deleted

## 2008-12-29 ENCOUNTER — Telehealth (INDEPENDENT_AMBULATORY_CARE_PROVIDER_SITE_OTHER): Payer: Self-pay | Admitting: Internal Medicine

## 2009-01-04 ENCOUNTER — Ambulatory Visit: Payer: Self-pay | Admitting: Internal Medicine

## 2009-01-04 DIAGNOSIS — M542 Cervicalgia: Secondary | ICD-10-CM | POA: Insufficient documentation

## 2009-01-07 DIAGNOSIS — D72819 Decreased white blood cell count, unspecified: Secondary | ICD-10-CM | POA: Insufficient documentation

## 2009-01-07 LAB — CONVERTED CEMR LAB
Basophils Relative: 0 % (ref 0–1)
CO2: 32 meq/L (ref 19–32)
Creatinine, Ser: 0.76 mg/dL (ref 0.40–1.20)
Eosinophils Absolute: 0.1 10*3/uL (ref 0.0–0.7)
Eosinophils Relative: 2 % (ref 0–5)
Glucose, Bld: 94 mg/dL (ref 70–99)
Hemoglobin: 13.1 g/dL (ref 12.0–15.0)
MCHC: 33.1 g/dL (ref 30.0–36.0)
MCV: 82.5 fL (ref >=78.0)
Monocytes Absolute: 0.2 10*3/uL (ref 0.1–1.0)
Monocytes Relative: 8 % (ref 3–12)
RBC: 4.8 M/uL (ref 3.87–5.11)
Total Bilirubin: 0.5 mg/dL (ref 0.3–1.2)
Total Protein: 8.3 g/dL (ref 6.0–8.3)

## 2009-01-08 ENCOUNTER — Encounter (INDEPENDENT_AMBULATORY_CARE_PROVIDER_SITE_OTHER): Payer: Self-pay | Admitting: Internal Medicine

## 2009-01-08 ENCOUNTER — Telehealth: Payer: Self-pay | Admitting: *Deleted

## 2009-01-09 ENCOUNTER — Encounter (INDEPENDENT_AMBULATORY_CARE_PROVIDER_SITE_OTHER): Payer: Self-pay | Admitting: Internal Medicine

## 2009-01-11 ENCOUNTER — Ambulatory Visit: Payer: Self-pay | Admitting: Internal Medicine

## 2009-01-11 ENCOUNTER — Ambulatory Visit (HOSPITAL_COMMUNITY): Admission: RE | Admit: 2009-01-11 | Discharge: 2009-01-11 | Payer: Self-pay | Admitting: Internal Medicine

## 2009-01-13 LAB — CONVERTED CEMR LAB
Eosinophils Absolute: 0.2 10*3/uL (ref 0.0–0.7)
HCT: 39 % (ref 36.0–46.0)
Lymphs Abs: 1.9 10*3/uL (ref 0.7–4.0)
MCV: 81.8 fL (ref >=78.0)
Monocytes Relative: 7 % (ref 3–12)
Neutrophils Relative %: 56 % (ref 43–77)
RBC: 4.77 M/uL (ref 3.87–5.11)
WBC: 5.6 10*3/uL (ref 4.0–10.5)

## 2009-01-15 ENCOUNTER — Encounter (INDEPENDENT_AMBULATORY_CARE_PROVIDER_SITE_OTHER): Payer: Self-pay | Admitting: Internal Medicine

## 2009-01-18 ENCOUNTER — Ambulatory Visit (HOSPITAL_COMMUNITY): Admission: RE | Admit: 2009-01-18 | Discharge: 2009-01-18 | Payer: Self-pay | Admitting: Internal Medicine

## 2009-01-29 ENCOUNTER — Telehealth (INDEPENDENT_AMBULATORY_CARE_PROVIDER_SITE_OTHER): Payer: Self-pay | Admitting: *Deleted

## 2009-01-31 ENCOUNTER — Encounter: Payer: Self-pay | Admitting: Internal Medicine

## 2009-02-19 ENCOUNTER — Telehealth: Payer: Self-pay | Admitting: *Deleted

## 2009-04-03 ENCOUNTER — Encounter: Payer: Self-pay | Admitting: Internal Medicine

## 2009-05-20 ENCOUNTER — Telehealth (INDEPENDENT_AMBULATORY_CARE_PROVIDER_SITE_OTHER): Payer: Self-pay | Admitting: *Deleted

## 2009-05-28 ENCOUNTER — Telehealth: Payer: Self-pay | Admitting: Internal Medicine

## 2009-06-12 ENCOUNTER — Ambulatory Visit: Payer: Self-pay | Admitting: Internal Medicine

## 2009-06-12 DIAGNOSIS — R519 Headache, unspecified: Secondary | ICD-10-CM | POA: Insufficient documentation

## 2009-06-12 DIAGNOSIS — R51 Headache: Secondary | ICD-10-CM

## 2009-06-12 LAB — CONVERTED CEMR LAB
Albumin: 4.2 g/dL (ref 3.5–5.2)
BUN: 15 mg/dL (ref 6–23)
Basophils Absolute: 0 10*3/uL (ref 0.0–0.1)
CO2: 25 meq/L (ref 19–32)
Calcium: 9.7 mg/dL (ref 8.4–10.5)
Chloride: 97 meq/L (ref 96–112)
Cholesterol: 223 mg/dL — ABNORMAL HIGH (ref 0–200)
Eosinophils Relative: 2 % (ref 0–5)
Glucose, Bld: 89 mg/dL (ref 70–99)
HCT: 40.9 % (ref 36.0–46.0)
HDL: 57 mg/dL (ref >39)
Hemoglobin, Urine: NEGATIVE
Hemoglobin: 12.9 g/dL (ref 12.0–15.0)
Ketones, ur: NEGATIVE mg/dL
Lymphocytes Relative: 34 % (ref 12–46)
Lymphs Abs: 1.7 10*3/uL (ref 0.7–4.0)
Monocytes Absolute: 0.4 10*3/uL (ref 0.1–1.0)
Monocytes Relative: 8 % (ref 3–12)
Potassium: 4.4 meq/L (ref 3.5–5.3)
RBC: 4.89 M/uL (ref 3.87–5.11)
RDW: 14 % (ref 11.5–15.5)
Specific Gravity, Urine: 1.015 (ref 1.005–1.0)
Total Protein: 8 g/dL (ref 6.0–8.3)
Triglycerides: 102 mg/dL (ref <150)
Urine Glucose: NEGATIVE mg/dL
pH: 5.5 (ref 5.0–8.0)

## 2009-06-17 ENCOUNTER — Encounter: Payer: Self-pay | Admitting: Internal Medicine

## 2009-07-03 ENCOUNTER — Telehealth: Payer: Self-pay | Admitting: Internal Medicine

## 2009-07-04 ENCOUNTER — Ambulatory Visit: Payer: Self-pay | Admitting: Internal Medicine

## 2009-07-04 DIAGNOSIS — J069 Acute upper respiratory infection, unspecified: Secondary | ICD-10-CM | POA: Insufficient documentation

## 2009-09-09 ENCOUNTER — Telehealth: Payer: Self-pay | Admitting: Internal Medicine

## 2009-09-10 ENCOUNTER — Telehealth: Payer: Self-pay | Admitting: *Deleted

## 2009-09-16 ENCOUNTER — Telehealth (INDEPENDENT_AMBULATORY_CARE_PROVIDER_SITE_OTHER): Payer: Self-pay | Admitting: *Deleted

## 2009-09-19 ENCOUNTER — Ambulatory Visit: Payer: Self-pay | Admitting: Internal Medicine

## 2009-09-19 DIAGNOSIS — G4700 Insomnia, unspecified: Secondary | ICD-10-CM | POA: Insufficient documentation

## 2009-09-20 ENCOUNTER — Encounter: Payer: Self-pay | Admitting: Internal Medicine

## 2009-09-20 LAB — CONVERTED CEMR LAB
Albumin: 4 g/dL (ref 3.5–5.2)
Alkaline Phosphatase: 88 units/L (ref 39–117)
BUN: 13 mg/dL (ref 6–23)
Basophils Absolute: 0 10*3/uL (ref 0.0–0.1)
CO2: 29 meq/L (ref 19–32)
Calcium: 9.8 mg/dL (ref 8.4–10.5)
Eosinophils Relative: 3 % (ref 0–5)
Glucose, Bld: 91 mg/dL (ref 70–99)
HCT: 41.6 % (ref 36.0–46.0)
Hemoglobin: 13.8 g/dL (ref 12.0–15.0)
Lymphocytes Relative: 26 % (ref 12–46)
MCHC: 33.1 g/dL (ref 30.0–36.0)
Monocytes Absolute: 0.3 10*3/uL (ref 0.1–1.0)
Monocytes Relative: 8 % (ref 3–12)
Neutro Abs: 2.7 10*3/uL (ref 1.7–7.7)
Potassium: 4.4 meq/L (ref 3.5–5.3)
RBC: 5.04 M/uL (ref 3.87–5.11)
RDW: 14.2 % (ref 11.5–15.5)
Sodium: 136 meq/L (ref 135–145)
Total Protein: 8.4 g/dL — ABNORMAL HIGH (ref 6.0–8.3)

## 2009-09-20 LAB — HM DIABETES FOOT EXAM: HM Diabetic Foot Exam: NORMAL

## 2009-10-07 ENCOUNTER — Telehealth: Payer: Self-pay | Admitting: *Deleted

## 2009-10-10 ENCOUNTER — Ambulatory Visit: Payer: Self-pay | Admitting: Internal Medicine

## 2009-10-21 ENCOUNTER — Ambulatory Visit (HOSPITAL_COMMUNITY): Admission: RE | Admit: 2009-10-21 | Discharge: 2009-10-21 | Payer: Self-pay | Admitting: Internal Medicine

## 2009-10-22 ENCOUNTER — Telehealth: Payer: Self-pay | Admitting: Internal Medicine

## 2009-10-28 ENCOUNTER — Telehealth: Payer: Self-pay | Admitting: Internal Medicine

## 2009-10-28 ENCOUNTER — Telehealth: Payer: Self-pay | Admitting: *Deleted

## 2009-11-19 ENCOUNTER — Telehealth: Payer: Self-pay | Admitting: Internal Medicine

## 2009-11-21 ENCOUNTER — Encounter: Payer: Self-pay | Admitting: Internal Medicine

## 2009-11-25 ENCOUNTER — Telehealth: Payer: Self-pay | Admitting: Internal Medicine

## 2009-12-04 ENCOUNTER — Telehealth: Payer: Self-pay | Admitting: Internal Medicine

## 2009-12-09 ENCOUNTER — Telehealth: Payer: Self-pay | Admitting: Internal Medicine

## 2009-12-16 ENCOUNTER — Telehealth: Payer: Self-pay | Admitting: Internal Medicine

## 2009-12-31 ENCOUNTER — Encounter: Payer: Self-pay | Admitting: Internal Medicine

## 2010-01-13 ENCOUNTER — Telehealth: Payer: Self-pay | Admitting: Internal Medicine

## 2010-01-27 ENCOUNTER — Telehealth: Payer: Self-pay | Admitting: *Deleted

## 2010-02-25 ENCOUNTER — Telehealth: Payer: Self-pay | Admitting: Internal Medicine

## 2010-02-26 ENCOUNTER — Telehealth: Payer: Self-pay | Admitting: Internal Medicine

## 2010-02-28 ENCOUNTER — Telehealth: Payer: Self-pay | Admitting: Internal Medicine

## 2010-03-04 ENCOUNTER — Encounter: Payer: Self-pay | Admitting: Internal Medicine

## 2010-03-24 ENCOUNTER — Ambulatory Visit: Payer: Self-pay | Admitting: Internal Medicine

## 2010-03-24 ENCOUNTER — Telehealth: Payer: Self-pay | Admitting: Internal Medicine

## 2010-03-26 ENCOUNTER — Telehealth: Payer: Self-pay | Admitting: *Deleted

## 2010-05-15 ENCOUNTER — Emergency Department (HOSPITAL_COMMUNITY)
Admission: EM | Admit: 2010-05-15 | Discharge: 2010-05-15 | Payer: Self-pay | Source: Home / Self Care | Admitting: Emergency Medicine

## 2010-05-15 ENCOUNTER — Telehealth (INDEPENDENT_AMBULATORY_CARE_PROVIDER_SITE_OTHER): Payer: Self-pay | Admitting: *Deleted

## 2010-05-19 ENCOUNTER — Telehealth: Payer: Self-pay | Admitting: Internal Medicine

## 2010-05-26 ENCOUNTER — Telehealth: Payer: Self-pay | Admitting: Internal Medicine

## 2010-05-30 ENCOUNTER — Emergency Department (HOSPITAL_COMMUNITY)
Admission: EM | Admit: 2010-05-30 | Discharge: 2010-05-30 | Payer: Self-pay | Source: Home / Self Care | Admitting: Emergency Medicine

## 2010-06-02 LAB — URINALYSIS, ROUTINE W REFLEX MICROSCOPIC
Bilirubin Urine: NEGATIVE
Hgb urine dipstick: NEGATIVE
Ketones, ur: NEGATIVE mg/dL
Nitrite: NEGATIVE
Protein, ur: NEGATIVE mg/dL
Specific Gravity, Urine: 1.007 (ref 1.005–1.030)
Urine Glucose, Fasting: NEGATIVE mg/dL
Urobilinogen, UA: 0.2 mg/dL (ref 0.0–1.0)
pH: 6.5 (ref 5.0–8.0)

## 2010-06-02 LAB — DIFFERENTIAL
Basophils Absolute: 0 10*3/uL (ref 0.0–0.1)
Basophils Relative: 0 % (ref 0–1)
Eosinophils Absolute: 0.2 10*3/uL (ref 0.0–0.7)
Eosinophils Relative: 3 % (ref 0–5)
Lymphocytes Relative: 29 % (ref 12–46)
Lymphs Abs: 1.6 10*3/uL (ref 0.7–4.0)
Monocytes Absolute: 0.4 10*3/uL (ref 0.1–1.0)
Monocytes Relative: 8 % (ref 3–12)
Neutro Abs: 3.4 10*3/uL (ref 1.7–7.7)
Neutrophils Relative %: 60 % (ref 43–77)

## 2010-06-02 LAB — CBC
HCT: 41.6 % (ref 36.0–46.0)
Hemoglobin: 13.7 g/dL (ref 12.0–15.0)
MCH: 26.7 pg (ref 26.0–34.0)
MCHC: 32.9 g/dL (ref 30.0–36.0)
MCV: 81.1 fL (ref 78.0–100.0)
Platelets: 410 10*3/uL — ABNORMAL HIGH (ref 150–400)
RBC: 5.13 MIL/uL — ABNORMAL HIGH (ref 3.87–5.11)
RDW: 14.6 % (ref 11.5–15.5)
WBC: 5.6 10*3/uL (ref 4.0–10.5)

## 2010-06-02 LAB — COMPREHENSIVE METABOLIC PANEL
ALT: 13 U/L (ref 0–35)
AST: 20 U/L (ref 0–37)
Albumin: 3.7 g/dL (ref 3.5–5.2)
Alkaline Phosphatase: 87 U/L (ref 39–117)
BUN: 6 mg/dL (ref 6–23)
CO2: 26 mEq/L (ref 19–32)
Calcium: 9.6 mg/dL (ref 8.4–10.5)
Chloride: 96 mEq/L (ref 96–112)
Creatinine, Ser: 0.84 mg/dL (ref 0.4–1.2)
GFR calc Af Amer: >60 mL/min (ref >60)
GFR calc non Af Amer: >60 mL/min (ref >60)
Glucose, Bld: 96 mg/dL (ref 70–99)
Potassium: 4.5 mEq/L (ref 3.5–5.1)
Sodium: 131 mEq/L — ABNORMAL LOW (ref 135–145)
Total Bilirubin: 0.5 mg/dL (ref 0.3–1.2)
Total Protein: 7.9 g/dL (ref 6.0–8.3)

## 2010-06-08 ENCOUNTER — Encounter: Payer: Self-pay | Admitting: Internal Medicine

## 2010-06-08 ENCOUNTER — Encounter: Payer: Self-pay | Admitting: Obstetrics

## 2010-06-09 ENCOUNTER — Encounter: Payer: Self-pay | Admitting: Internal Medicine

## 2010-06-09 ENCOUNTER — Telehealth: Payer: Self-pay | Admitting: Internal Medicine

## 2010-06-12 ENCOUNTER — Other Ambulatory Visit: Payer: Self-pay | Admitting: Obstetrics

## 2010-06-12 DIAGNOSIS — Z1239 Encounter for other screening for malignant neoplasm of breast: Secondary | ICD-10-CM

## 2010-06-17 NOTE — Miscellaneous (Signed)
Summary: FL2 FORM  FL2 FORM   Imported By: Shon Hough 03/06/2010 15:04:00  _____________________________________________________________________  External Attachment:    Type:   Image     Comment:   External Document

## 2010-06-17 NOTE — Progress Notes (Signed)
Summary: FOOT APPOINTMENT  Phone Note Outgoing Call   Call placed by: Filomena Jungling NT II,  December 04, 2009 4:45 PM Call placed to: Patient Action Taken: Appt scheduled Reason for Call: Confirm/change Appt Summary of Call: PATIENT HAS APPOINTMENT WITH [facility] Gwenlyn Perking [id number] AT 8;30 Initial call taken by: Filomena Jungling NT II,  December 04, 2009 4:55 PM

## 2010-06-17 NOTE — Progress Notes (Signed)
Summary: Refill/gh  Phone Note Refill Request Message from:  Fax from Pharmacy on May 20, 2009 11:49 AM  Refills Requested: Medication #1:  PRILOSEC OTC 20 MG  TBEC Take 1 tablet by mouth once a day   Last Refilled: 04/15/2009  Method Requested: Electronic Initial call taken by: Angelina Ok RN,  May 20, 2009 11:54 AM    Prescriptions: PRILOSEC OTC 20 MG  TBEC (OMEPRAZOLE MAGNESIUM) Take 1 tablet by mouth once a day  #30 x 5   Entered and Authorized by:   Blanch Media MD   Signed by:   Blanch Media MD on 05/20/2009   Method used:   Faxed to ...       [facility] (retail)       [address]       [place], [place]  [address]       Ph: [phone]       Fax: [phone]   RxID:   [id number]

## 2010-06-17 NOTE — Progress Notes (Signed)
Summary: phone/gg  Phone Note Call from Patient   Summary of Call: Call from CNA for pt reporting pt BP elevated past several days:  on  4/30 - 142/109  pulse 77    on May 1 -  144/112 pulse  73    on may 2 145/99 pulse 77  Last visit in clinic was 2/17 BP was   102/69,  Dr Annice Pih reduced HCTZ to 1/2 tab daily. Pt not feeling well &  c/o headaches.  please advise Initial call taken by: Merrie Roof RN,  Sep 16, 2009 1:44 PM  Follow-up for Phone Call        she can resume her regular dose of HCTZ. She needs to get labs and be seen in clinic next available appointment. Follow-up by: Julaine Fusi  DO,  Sep 16, 2009 2:19 PM  Additional Follow-up for Phone Call Additional follow up Details #1::        appointment given 09/18/09.  son voices understanding. [facility] pharmacy called and med changed given to them. Additional Follow-up by: Merrie Roof RN,  Sep 16, 2009 3:31 PM

## 2010-06-17 NOTE — Progress Notes (Signed)
Summary: appt/ hla  Phone Note Other Incoming   Summary of Call: hhaide calls to say pt family wanted her to call with c/o cough, lethargy and chest pain with cough, does not know temp status. able to eat and drink. appt given 2/17 due to transportation Initial call taken by: Marin Roberts RN,  July 03, 2009 3:10 PM

## 2010-06-17 NOTE — Miscellaneous (Signed)
Summary: medication changes  Clinical Lists Changes  Medications: Added new medication of WHEELCHAIR  MISC (MISC. DEVICES) - Signed

## 2010-06-17 NOTE — Miscellaneous (Signed)
  Clinical Lists Changes Anemia: Hb normal. I will stop iron for now. Patient will need CBC next visit.  Medications: Removed medication of FERROUS SULFATE 325 (65 FE) MG TABS (FERROUS SULFATE) Take 1 tablet by mouth three times a day

## 2010-06-17 NOTE — Progress Notes (Signed)
Summary: refill/gg  Phone Note Refill Request  on May 28, 2009 2:56 PM  Refills Requested: Medication #1:  REGLAN 5 MG  TABS Take 1 tablet by mouth four times a day   Last Refilled: 04/29/2009  Medication #2:  EFFEXOR XR 75 MG CP24 Take 1 capsule by mouth once a day   Last Refilled: 04/29/2009  Method Requested: Fax to Local Pharmacy Initial call taken by: Merrie Roof RN,  May 28, 2009 2:56 PM  Follow-up for Phone Call        Refill approved-nurse to complete    Prescriptions: REGLAN 5 MG  TABS (METOCLOPRAMIDE HCL) Take 1 tablet by mouth four times a day  #120 x 5   Entered and Authorized by:   Julaine Fusi  DO   Signed by:   Julaine Fusi  DO on 05/29/2009   Method used:   Faxed to ...       [facility] (retail)       [address]       [place], [place]  [address]       Ph: [phone]       Fax: [phone]   RxID:   [id number] EFFEXOR XR 75 MG CP24 (VENLAFAXINE HCL) Take 1 capsule by mouth once a day  #30 x 5   Entered and Authorized by:   Julaine Fusi  DO   Signed by:   Julaine Fusi  DO on 05/29/2009   Method used:   Faxed to ...       [facility] (retail)       [address]       [place], [place]  [address]       Ph: [phone]       Fax: [phone]   RxID:   [id number]

## 2010-06-17 NOTE — Progress Notes (Signed)
Summary: Refill/gh  Phone Note Refill Request Message from:  Fax from Pharmacy on September 09, 2009 10:05 AM  Refills Requested: Medication #1:  LISINOPRIL 10 MG TABS Take 1 tablet by mouth once a day   Last Refilled: 08/06/2009  Method Requested: Electronic Initial call taken by: Angelina Ok RN,  September 09, 2009 10:06 AM  Follow-up for Phone Call        Refill approved Follow-up by: Bethel Born MD,  September 09, 2009 1:41 PM    Prescriptions: LISINOPRIL 10 MG TABS (LISINOPRIL) Take 1 tablet by mouth once a day  #30 x 3   Entered and Authorized by:   Bethel Born MD   Signed by:   Bethel Born MD on 09/09/2009   Method used:   Faxed to ...       [facility] (retail)       [address]       [place], [place]  [address]       Ph: [phone]       Fax: [phone]   RxID:   [id number]

## 2010-06-17 NOTE — Progress Notes (Signed)
Summary: Medication Change  Phone Note Refill Request Message from:  Fax from Pharmacy on February 28, 2010 10:20 AM  Refills Requested: Medication #1:  PROVENTIL HFA 108 (90 BASE) MCG/ACT AERS 2 puff Q4 hrs as needed Can this be changed to Ventolin.  Pt's plan does not cover.   Method Requested: Fax to Local Pharmacy Initial call taken by: Angelina Ok RN,  February 28, 2010 10:22 AM  Follow-up for Phone Call        changes made.  thanks Follow-up by: Bethel Born MD,  March 01, 2010 9:34 AM    New/Updated Medications: VENTOLIN HFA 108 (90 BASE) MCG/ACT AERS (ALBUTEROL SULFATE) 1-2 puffs as needed for wheezing,shortness of breath or severe cough Prescriptions: VENTOLIN HFA 108 (90 BASE) MCG/ACT AERS (ALBUTEROL SULFATE) 1-2 puffs as needed for wheezing,shortness of breath or severe cough  #1 x 0   Entered and Authorized by:   Bethel Born MD   Signed by:   Bethel Born MD on 03/01/2010   Method used:   Faxed to ...       [facility] (retail)       [address]       [place], [place]  [address]       Ph: [phone]       Fax: [phone]   RxID:   [id number]

## 2010-06-17 NOTE — Progress Notes (Signed)
Summary: med refill/gp  Phone Note Refill Request Message from:  Fax from Pharmacy on October 22, 2009 2:19 PM  Refills Requested: Medication #1:  NORVASC 10 MG TABS Take 1 tablet by mouth once a day   Dosage confirmed as above?Dosage Confirmed   Last Refilled: 09/17/2009  Medication #2:  HYDROCHLOROTHIAZIDE 25 MG  TABS Take 1/2 tablet by mouth once a day   Last Refilled: 09/21/2009 Last CMP 09/19/09. Last OV 10/10/09.   Method Requested: Fax to Local Pharmacy Initial call taken by: Chinita Pester RN,  October 22, 2009 2:19 PM  Follow-up for Phone Call        Refill approved Follow-up by: Bethel Born MD,  October 23, 2009 7:48 AM  Additional Follow-up for Phone Call Additional follow up Details #1::        Dr. Scot Dock can u sign the HCTZ refill?  Also do want to refill Norvasc?  Thanks Additional Follow-up by: Chinita Pester RN,  October 23, 2009 11:06 AM    Additional Follow-up for Phone Call Additional follow up Details #2::    Yes sorry, I wanted to check her previous records.  Follow-up by: Bethel Born MD,  October 23, 2009 12:09 PM  Prescriptions: NORVASC 10 MG TABS (AMLODIPINE BESYLATE) Take 1 tablet by mouth once a day  #30 x 3   Entered and Authorized by:   Bethel Born MD   Signed by:   Bethel Born MD on 10/23/2009   Method used:   Faxed to ...       [facility] ([facility])       [address]       [place], [place]  [address]       Ph: [phone]       Fax: [phone]   RxID:   [id number] HYDROCHLOROTHIAZIDE 25 MG  TABS (HYDROCHLOROTHIAZIDE) Take 1/2 tablet by mouth once a day  #30 x 3   Entered and Authorized by:   Bethel Born MD   Signed by:   Bethel Born MD on 10/23/2009   Method used:   Faxed to ...       [facility] (retail)       [address]       [place], [place]  [address]       Ph: [phone]       Fax: [phone]   RxID:   [id number]

## 2010-06-17 NOTE — Progress Notes (Signed)
Summary: med refill/gp  Phone Note Refill Request Message from:  Fax from Pharmacy on February 26, 2010 9:52 AM  Refills Requested: Medication #1:  HYDROCHLOROTHIAZIDE 25 MG  TABS Take 1/2 tablet by mouth once a day   Dosage confirmed as above?Dosage Confirmed   Last Refilled: 01/21/2010  Medication #2:  NORVASC 10 MG TABS Take 1 tablet by mouth once a day   Dosage confirmed as above?Dosage Confirmed   Last Refilled: 01/21/2010 Last appt.  10/10/09.   Method Requested: Electronic Initial call taken by: Chinita Pester RN,  February 26, 2010 9:52 AM  Follow-up for Phone Call       Follow-up by: Bethel Born MD,  February 26, 2010 11:44 AM    Prescriptions: NORVASC 10 MG TABS (AMLODIPINE BESYLATE) Take 1 tablet by mouth once a day  #30 x 6   Entered and Authorized by:   Bethel Born MD   Signed by:   Bethel Born MD on 02/26/2010   Method used:   Faxed to ...       [facility] ([facility])       [address]       [place], [place]  [address]       Ph: [phone]       Fax: [phone]   RxID:   [id number] HYDROCHLOROTHIAZIDE 25 MG  TABS (HYDROCHLOROTHIAZIDE) Take 1/2 tablet by mouth once a day  #30 x 6   Entered and Authorized by:   Bethel Born MD   Signed by:   Bethel Born MD on 02/26/2010   Method used:   Faxed to ...       [facility] ([facility])       [address]       [place], [place]  [address]       Ph: [phone]       Fax: [phone]   RxID:   [id number]

## 2010-06-17 NOTE — Letter (Signed)
Summary: MAMMOGRAM  MAMMOGRAM   Imported By: Margie Billet 08/15/2009 14:59:11  _____________________________________________________________________  External Attachment:    Type:   Image     Comment:   External Document

## 2010-06-17 NOTE — Progress Notes (Signed)
Summary: REfill/gh  Phone Note Refill Request Message from:  Fax from Pharmacy on January 13, 2010 11:30 AM  Refills Requested: Medication #1:  LISINOPRIL 10 MG TABS Take 1 tablet by mouth once a day   Dosage confirmed as above?Dosage Confirmed   Last Refilled: 12/14/2009  Method Requested: Fax to Local Pharmacy Initial call taken by: Angelina Ok RN,  January 13, 2010 11:32 AM  Follow-up for Phone Call       Follow-up by: Bethel Born MD,  January 13, 2010 1:14 PM    Prescriptions: LISINOPRIL 10 MG TABS (LISINOPRIL) Take 1 tablet by mouth once a day  #30 x 6   Entered and Authorized by:   Bethel Born MD   Signed by:   Bethel Born MD on 01/13/2010   Method used:   Faxed to ...       [facility] (ret[facility])       [address]       [place], [place]  [address]       Ph: [phone]       Fax: [phone]   RxID:   [id number]

## 2010-06-17 NOTE — Miscellaneous (Signed)
Summary: [facility]  [facility]   Imported By: Shon Hough 01/21/2010 10:57:55  _____________________________________________________________________  External Attachment:    Type:   Image     Comment:   External Document

## 2010-06-17 NOTE — Progress Notes (Signed)
Summary: phone/gg  Phone Note From Other Clinic   Summary of Call: Call from [facility] with [facility] asking about paperwork - FL2 that needs to be completed. Please call her at [phone] and let her know if you have the paperwork. Initial call taken by: Merrie Roof RN,  March 26, 2010 11:18 AM  Follow-up for Phone Call        Paperwork was here in Dr. Rowe Robert box.  Forms have know been given to [employer]. Follow-up by: Shon Hough,  April 03, 2010 12:08 PM

## 2010-06-17 NOTE — Progress Notes (Signed)
Summary: appt/ hla  Phone Note Call from Patient   Caller: caretaker Summary of Call: pt's caretaker calls to say pt has been coughing for 2-3 days and slight wheezes w/ activity. inhalers don't seem to be helping as much as usual also family would like her to see neuro again. appt is given for fri 4/29 as requested. Initial call taken by: Marin Roberts RN,  September 10, 2009 3:34 PM

## 2010-06-17 NOTE — Progress Notes (Signed)
Summary: refill/gg  Phone Note Refill Request  on November 19, 2009 4:57 PM  Refills Requested: Medication #1:  PRILOSEC OTC 20 MG  TBEC Take 1 tablet by mouth once a day   Last Refilled: 10/22/2009  Method Requested: Fax to Local Pharmacy Initial call taken by: Merrie Roof RN,  November 19, 2009 4:58 PM    Prescriptions: PRILOSEC OTC 20 MG  TBEC (OMEPRAZOLE MAGNESIUM) Take 1 tablet by mouth once a day  #30 x 5   Entered and Authorized by:   Bethel Born MD   Signed by:   Bethel Born MD on 11/20/2009   Method used:   Telephoned to ...       [facility] ([facility])       [address]       [place], [place]  [address]       Ph: [phone]       Fax: [phone]   RxID:   [id number]   Appended Document: refill/gg rx faxed in

## 2010-06-17 NOTE — Progress Notes (Signed)
Summary: refill/gg  Phone Note Refill Request  on November 25, 2009 5:09 PM  Refills Requested: Medication #1:  EFFEXOR XR 75 MG CP24 Take 1 capsule by mouth once a day   Dosage confirmed as above?Dosage Confirmed   Last Refilled: 10/22/2009  Medication #2:  REGLAN 5 MG  TABS Take 1 tablet by mouth four times a day   Dosage confirmed as above?Dosage Confirmed   Last Refilled: 10/22/2009  Method Requested: Fax to Local Pharmacy Initial call taken by: Merrie Roof RN,  November 25, 2009 5:09 PM  Follow-up for Phone Call       Follow-up by: Bethel Born MD,  November 26, 2009 7:26 AM    Prescriptions: REGLAN 5 MG  TABS (METOCLOPRAMIDE HCL) Take 1 tablet by mouth four times a day  #120 x 5   Entered and Authorized by:   Bethel Born MD   Signed by:   Bethel Born MD on 11/26/2009   Method used:   Faxed to ...       [facility] ([facility])       [address]       [place], [place]  [address]       Ph: [phone]       Fax: [phone]   RxID:   [id number] EFFEXOR XR 75 MG CP24 (VENLAFAXINE HCL) Take 1 capsule by mouth once a day  #30 x 5   Entered and Authorized by:   Bethel Born MD   Signed by:   Bethel Born MD on 11/26/2009   Method used:   Faxed to ...       [facility] ([facility])       [address]       [place], [place]  [address]       Ph: [phone]       Fax: [phone]   RxID:   [id number]

## 2010-06-17 NOTE — Progress Notes (Signed)
Summary: REfill/gh  Phone Note Refill Request Message from:  Fax from Pharmacy on March 24, 2010 10:57 AM  Refills Requested: Medication #1:  KEPPRA 500 MG TABS Take 1 tablet by mouth two times a day   Last Refilled: 03/18/2009  Medication #2:  METOPROLOL TARTRATE 50 MG TABS Take 1 tab by mouth two times a day   Last Refilled: 02/18/2010  Method Requested: Electronic Initial call taken by: Angelina Ok RN,  March 24, 2010 10:58 AM  Follow-up for Phone Call        Rx completed in Dr. Tiajuana Amass Follow-up by: Jackson Latino MD,  March 24, 2010 11:33 AM

## 2010-06-17 NOTE — Letter (Signed)
Summary: MAMMOGRAM  MAMMOGRAM   Imported By: Margie Billet 08/16/2009 10:31:45  _____________________________________________________________________  External Attachment:    Type:   Image     Comment:   External Document

## 2010-06-17 NOTE — Assessment & Plan Note (Signed)
Summary: h/a for several weeks/pcp-Wanda [name]/hla   Vital Signs:  Patient profile:   55 year old female Height:      70 inches (177.80 cm) Weight:      205.1 pounds (93.23 kg) BMI:     29.54 Temp:     97.4 degrees F Pulse rate:   78 / minute BP sitting:   103 / 72  (right arm) Cuff size:   large  Vitals Entered By: Dorie Rank RN (June 12, 2009 2:43 PM) CC: front h/a on and off for months - does not note pattern or trend - no worse now than in past few months - notes urine color and smell different sometimes but denies dysuria - chronic "leaking" - color changes have been occurring for months - states does not "drink many fluids - not 8 oz 8 times a day Is Patient Diabetic? No Pain Assessment Patient in pain? no      Nutritional Status BMI of 25 - 29 = overweight  Have you ever been in a relationship where you felt threatened, hurt or afraid?Unable to ask   Does patient need assistance? Functional Status Cook/clean, Shopping, Social activities Ambulation Impaired:Risk for fall, Wheelchair Comments needs asst ADL - all kinds - son wants to know if home health can help pt exercise at home since she no longer has PT   Primary Care Provider:  Bethel Born MD  CC:  front h/a on and off for months - does not note pattern or trend - no worse now than in past few months - notes urine color and smell different sometimes but denies dysuria - chronic "leaking" - color changes have been occurring for months - states does not "drink many fluids - not 8 oz 8 times a day.  History of Present Illness: 55y/o woman with PMH as outline in the EMRcomesfor a routine followup visit. The son provides the main history as the patient has aphasia and it is impossible to understand her. She has a nurse at [facility] takes care of her from morning to 3pm after which her sons look after her. But the son who is with her today does not know a lot about her medication and complaints.   IT appears that the  patient is having headaches since last 2 days. They occur infrequaently without any pattern during the day. They are 5/10 in severity and on the frontal part of head.  The son denies his mother having fevers, new neurological impairment or any other new symptoms. She has not tried tylenol or other pain medication at home.    The paitent also complaints dysura, increased frequency, and dark yellow color urine and some pain in the suparpubic area. Again, no fevers, chills, blood in urine/stool, diarrhea/constipation.  Depression History:      The patient denies a depressed mood most of the day and a diminished interest in her usual daily activities.        Comments:  "good days and bad - somedays harder to get up and move".   Current Medications (verified): 1)  Metoprolol Tartrate 50 Mg Tabs (Metoprolol Tartrate) .... Take 1 Tab By Mouth Two Times A Day 2)  Keppra 500 Mg Tabs (Levetiracetam) .... Take 1 Tablet By Mouth Two Times A Day 3)  Plaquenil 200 Mg Tabs (Hydroxychloroquine Sulfate) .... Take 1 Tablet By Mouth Two Times A Day 4)  Effexor Xr 75 Mg Cp24 (Venlafaxine Hcl) .... Take 1 Capsule By Mouth Once A Day 5)  Prilosec  Otc 20 Mg  Tbec (Omeprazole Magnesium) .... Take 1 Tablet By Mouth Once A Day 6)  Reglan 5 Mg  Tabs (Metoclopramide Hcl) .... Take 1 Tablet By Mouth Four Times A Day 7)  Aspirin 81 Mg  Tbec (Aspirin) .... Take 1 Tablet By Mouth Once A Day 8)  Norvasc 10 Mg Tabs (Amlodipine Besylate) .... Take 1 Tablet By Mouth Once A Day 9)  Ferrous Sulfate 325 (65 Fe) Mg Tabs (Ferrous Sulfate) .... Take 1 Tablet By Mouth Three Times A Day 10)  Lisinopril 10 Mg Tabs (Lisinopril) .... Take 1 Tablet By Mouth Once A Day 11)  Hydrochlorothiazide 25 Mg  Tabs (Hydrochlorothiazide) .... Take 1 Tablet By Mouth Once A Day 12)  Capsaicin Apr 0.075 % Crea (Capsaicin) .... Apply On Painful Spot On Forehead Three Times A Day As Needed For Pain 13)  Robitussin Dm 100-10 Mg/17ml Syrp  (Dextromethorphan-Guaifenesin) .Marland Kitchen.. 10 Mg Q 4hrs 14)  Proventil Hfa 108 (90 Base) Mcg/act Aers (Albuterol Sulfate) .... 2 Puff Q4 Hrs As Needed 15)  Protective Underwear Medium  Misc (Incontinence Supply Disposable) 16)  Underpads Regular  Misc (Incontinence Supply Disposable)  Allergies (verified): 1)  ! Pcn  Review of Systems       The patient complains of headaches, abdominal pain, incontinence, and difficulty walking.  The patient denies anorexia, fever, weight loss, weight gain, vision loss, decreased hearing, hoarseness, chest pain, syncope, dyspnea on exertion, peripheral edema, prolonged cough, hemoptysis, melena, hematochezia, severe indigestion/heartburn, hematuria, genital sores, muscle weakness, suspicious skin lesions, transient blindness, depression, unusual weight change, abnormal bleeding, enlarged lymph nodes, angioedema, breast masses, and testicular masses.    Physical Exam  General:  awake. NAD Mouth:  no oropharyngeal redness Neck:  supple, no JVD, no neck stiffness Lungs:  CTA B/L, no wheezes Heart:  S1 S2 normal, no murmurs Abdomen:  tender to deep palpation in suprapubic region. No rigidity, no rebound tenderness, no cVA tenderness,  Extremities:  dorsalis pedis pulses normal bilaterally    Impression & Recommendations:  Problem # 1:  HEADACHE (ICD-784.0) - it seems like these are regulalr kind of headaches without any complication. Again, the history is not very accurate but the physical exam is benign. Will ask the patient to take tylenol and call us if the pain does not get releived with that.   Also, have asked the patients nurse to call me when she is at home to discuss more about the patients problem to get a better grasp of the condition.  No further imaging or test at this time.  Her updated medication list for this problem includes:    Metoprolol Tartrate 50 Mg Tabs (Metoprolol tartrate) .Marland Kitchen... Take 1 tab by mouth two times a day    Aspirin 81 Mg Tbec  (Aspirin) .Marland Kitchen... Take 1 tablet by mouth once a day  Problem # 2:  DYSURIA (ICD-788.1) Will get UA/UC and treat if there is any evidence of UTI as the symotms are suggestive of mild infection.  Her UA might not be very accurate given we are not obtaining the sample by catheter.   Orders: T-Urinalysis ([id number]) T-Culture, Urine ([id number])  Problem # 3:  ASTHMA (ICD-493.90) stable. no recent episode. Very infrequent use of albuterol.  Her updated medication list for this problem includes:    Proventil Hfa 108 (90 Base) Mcg/act Aers (Albuterol sulfate) .Marland Kitchen... 2 puff q4 hrs as needed  Problem # 4:  SYSTEMIC LUPUS ERYTHEMATOSUS (ICD-710.0) Will check CBC and BMET and try to get  records from recent eye exam (03/2009) at Dr. Jacelyn Pi office for side effect of chloroquine.  Continue current regimen.  Her updated medication list for this problem includes:    Plaquenil 200 Mg Tabs (Hydroxychloroquine sulfate) .Marland Kitchen... Take 1 tablet by mouth two times a day    Aspirin 81 Mg Tbec (Aspirin) .Marland Kitchen... Take 1 tablet by mouth once a day  Problem # 5:  SEIZURE DISORDER (ICD-780.39)  Stbale on keppra. No change.   Her updated medication list for this problem includes:    Keppra 500 Mg Tabs (Levetiracetam) .Marland Kitchen... Take 1 tablet by mouth two times a day  Labs Reviewed: Hgb: 12.5 (01/11/2009)   Hct: 39.0 (01/11/2009)   WBC: 5.6 (01/11/2009)   RBC: 4.77 (01/11/2009)   Plts: 369 (01/11/2009) SGOT: 17 (01/04/2009)   SGPT: 18 (01/04/2009)     Problem # 6:  HYPERTENSION (ICD-401.9) Controlled on current regimen.   Her updated medication list for this problem includes:    Metoprolol Tartrate 50 Mg Tabs (Metoprolol tartrate) .Marland Kitchen... Take 1 tab by mouth two times a day    Norvasc 10 Mg Tabs (Amlodipine besylate) .Marland Kitchen... Take 1 tablet by mouth once a day    Lisinopril 10 Mg Tabs (Lisinopril) .Marland Kitchen... Take 1 tablet by mouth once a day    Hydrochlorothiazide 25 Mg Tabs (Hydrochlorothiazide) .Marland Kitchen... Take 1 tablet by  mouth once a day  Orders: T-Comprehensive Metabolic Panel [id number]) T-Lipid Profile ([id number])  BP today: 103/72 Prior BP: 109/65 (01/04/2009)  Labs Reviewed: K+: 4.0 (01/04/2009) Creat: : 0.76 (01/04/2009)     Problem # 7:  ANEMIA-IRON DEFICIENCY (ICD-280.9) Taking iron pills at home. Will check CBC  Her updated medication list for this problem includes:    Ferrous Sulfate 325 (65 Fe) Mg Tabs (Ferrous sulfate) .Marland Kitchen... Take 1 tablet by mouth three times a day  Orders: T-CBC w/Diff ([id number]) T-Hemoccult Cards-Multiple ([id number])  Hgb: 12.5 (01/11/2009)   Hct: 39.0 (01/11/2009)   Platelets: 369 (01/11/2009) RBC: 4.77 (01/11/2009)   RDW: 14.8 (01/11/2009)   WBC: 5.6 (01/11/2009) MCV: 81.8 (01/11/2009)   MCHC: 32.1 (01/11/2009) Ferritin: 17 (06/21/2008) TSH: 2.565 (06/23/2007)  Problem # 8:  Preventive Health Care (ICD-V70.0) Son denies flu and tetanus shot at this visit for his mother. Had a colonoscopt withing 10 yrs that was normal. Had a mammogrma butpatient does not remember when and what were the results. Will try to get records. Order hemoocult cards.   Will check lipid profile as never been done. Patient had a breakfast ( 6 hrs ago)   Complete Medication List: 1)  Metoprolol Tartrate 50 Mg Tabs (Metoprolol tartrate) .... Take 1 tab by mouth two times a day 2)  Keppra 500 Mg Tabs (Levetiracetam) .... Take 1 tablet by mouth two times a day 3)  Plaquenil 200 Mg Tabs (Hydroxychloroquine sulfate) .... Take 1 tablet by mouth two times a day 4)  Effexor Xr 75 Mg Cp24 (Venlafaxine hcl) .... Take 1 capsule by mouth once a day 5)  Prilosec Otc 20 Mg Tbec (Omeprazole magnesium) .... Take 1 tablet by mouth once a day 6)  Reglan 5 Mg Tabs (Metoclopramide hcl) .... Take 1 tablet by mouth four times a day 7)  Aspirin 81 Mg Tbec (Aspirin) .... Take 1 tablet by mouth once a day 8)  Norvasc 10 Mg Tabs (Amlodipine besylate) .... Take 1 tablet by mouth once a day 9)  Ferrous Sulfate  325 (65 Fe) Mg Tabs (Ferrous sulfate) .... Take 1 tablet by mouth three times a day  10)  Lisinopril 10 Mg Tabs (Lisinopril) .... Take 1 tablet by mouth once a day 11)  Hydrochlorothiazide 25 Mg Tabs (Hydrochlorothiazide) .... Take 1 tablet by mouth once a day 12)  Capsaicin Apr 0.075 % Crea (Capsaicin) .... Apply on painful spot on forehead three times a day as needed for pain 13)  Robitussin Dm 100-10 Mg/2ml Syrp (Dextromethorphan-guaifenesin) .Marland Kitchen.. 10 mg q 4hrs 14)  Proventil Hfa 108 (90 Base) Mcg/act Aers (Albuterol sulfate) .... 2 puff q4 hrs as needed 15)  Protective Underwear Medium Misc (Incontinence supply disposable) 16)  Underpads Regular Misc (Incontinence supply disposable)  Patient Instructions: 1)  - Please ask Dr. Mitzi Davenport to send the records from eye exams to [place] 2)  - Try tylenol over the counter for headaches 3)  - Please schedule a follow-up appointment in 2 months. Process Orders Check Orders Results:     [facility]: Order checked:      -- T-Hemoccult Cards-Multiple -- No CPT codes found (CPT: )      -- T-Hemoccult Cards-Multiple --  NO TEST CODE (CPT: ) Tests Sent for requisitioning (June 12, 2009 4:08 PM):     06/12/2009: [facility] -- T-Comprehensive Metabolic Panel [[id number]] (signed)     06/12/2009: [facility] -- T-CBC w/Diff [[id number]] (signed)     06/12/2009: [facility] -- T-Urinalysis [[id number]] (signed)     06/12/2009: [facility] -- T-Culture, Urine [[id number]] (signed)     06/12/2009: [facility] -- T-Lipid Profile [id number] (signed)     06/12/2009: [facility] -- T-Hemoccult Cards-Multiple [[id number]] (signed)    Prevention & Chronic Care Immunizations   Influenza vaccine: Fluvax Non-MCR  (03/15/2008)   Influenza vaccine deferral: Refused  (06/12/2009)    Tetanus booster: Not documented   Td booster deferral: Refused   (06/12/2009)    Pneumococcal vaccine: Pneumovax  (03/10/2005)    Immunization comments: The son refused immunizations today as he is not comfortable that his mother get the shots while the nurse( who knows her medical condition better) is not around. Asked him to try to come to the next appointment with the nurse.   Colorectal Screening   Hemoccult: Not documented   Hemoccult action/deferral: Ordered  (06/12/2009)    Colonoscopy: Results: Normal.   (10/25/2006)   Colonoscopy due: 10/2011  Other Screening   Pap smear: Specimen Adequacy: Satisfactory for evaluation.   Interpretation/Result:Negative for intraepithelial Lesion or Malignancy.     (12/29/2005)   Pap smear action/deferral: Not indicated S/P hysterectomy  (06/12/2009)    Mammogram: Not documented   Mammogram action/deferral: Deferred  (06/12/2009)  Reports requested:   Last mammogram report requested.  Smoking status: quit  (01/04/2009)  Lipids   Total Cholesterol: Not documented   LDL: Not documented   LDL Direct: Not documented   HDL: Not documented   Triglycerides: Not documented  Hypertension   Last Blood Pressure: 103 / 72  (06/12/2009)   Serum creatinine: 0.76  (01/04/2009)   Serum potassium 4.0  (01/04/2009) CMP ordered     Hypertension flowsheet reviewed?: Yes   Progress toward BP goal: At goal  Self-Management Support :    Patient will work on the following items until the next clinic visit to reach self-care goals:     Medications and monitoring: take my medicines every day, bring all of my medications to every visit  (06/12/2009)     Eating: eat baked foods instead of fried foods  (06/12/2009)     Other: does  not intend to change salt intake or soda intake - no exercise at present  (06/12/2009)    Hypertension self-management support: Not documented   Nursing Instructions: Provide Hemoccult cards with instructions (see order) Request report of last mammogram

## 2010-06-17 NOTE — Progress Notes (Signed)
Summary: phone/gg  Phone Note Call from Patient Call back at [phone]   Caller: Wanda [name] Call For: Bethel Born MD Reason for Call: Talk to Doctor Details for Reason: RIGHT FOOT SWELLING Summary of Call: Phone mesage from [facility] for Wanda [name] with complaint of Right Foot swelling which started this weekend.The family has had her foot proped up,Wanda states the foot is warm and and the patient is complaining of pain.I will sendthis to the triage nurse. Initial call taken by: Filomena Jungling NT II,  January 27, 2010 10:58 AM  Follow-up for Phone Call        I returned call to Pt nurse who states pt is  c/o swelling to right foot. swelling from ankle to foot.  on-set first of August.  Also having pain in that foot.  No known injury, foot warm to touch No swelling to left foot.  appointment made for evaluation on Wed. Follow-up by: Merrie Roof RN,  January 27, 2010 11:30 AM  Additional Follow-up for Phone Call Additional follow up Details #1::        Can we get her in today for evaluation? Additional Follow-up by: Margarito Liner MD,  January 27, 2010 12:31 PM    Additional Follow-up for Phone Call Additional follow up Details #2::    Pt depends of transportation to get to clinic.  Unable to get here before Wednesday. Nurse informed that if any changes before Wed. that pt should be evaluated in ED. Patient/caller verbalizes understanding of these instructions.  Follow-up by: Merrie Roof RN,  January 27, 2010 2:04 PM

## 2010-06-17 NOTE — Assessment & Plan Note (Signed)
Summary: BP /(DEVANI) SB.   Vital Signs:  Patient profile:   55 year old female Height:      70 inches Weight:      200.7 pounds BMI:     28.90 Temp:     98.0 degrees F oral Pulse rate:   78 / minute BP sitting:   103 / 70  (right arm)  Vitals Entered By: Filomena Jungling NT II (Sep 19, 2009 10:16 AM) CC: BLOOD PRESSURE, WHEEZING, HEADACHES, Depression, Headache Is Patient Diabetic? No Pain Assessment Patient in pain? yes     Location: HEADACHES Intensity: 7 Type: Haching Onset of pain  Constant Nutritional Status BMI of 25 - 29 = overweight  Does patient need assistance? Ambulation Wheelchair Comments has assistants   Primary Care Provider:  Bethel Born MD  CC:  BLOOD PRESSURE, WHEEZING, HEADACHES, Depression, and Headache.  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 Tegretol Hypokalemia - 2nd diuretic Menorrhagia - Tamela Oddi   She present for BP follow up. The home nurse relates that her BP is better now that she is on HCTZ 25 mg.  She has some difficulty falling sleep.  She is complaining of headaches, started a long time. She is not taking anything for tylenol.   Depression History:      The patient denies a depressed mood most of the day and a diminished interest in her usual daily activities.         Headache Treatment History:      She has tried acetaminophen-plain which was effective.     Preventive Screening-Counseling & Management  Alcohol-Tobacco     Alcohol drinks/day: 0     Smoking Status: quit     Year Quit: 2004  Caffeine-Diet-Exercise     Does Patient Exercise: no  Current Medications  (verified): 1)  Metoprolol Tartrate 50 Mg Tabs (Metoprolol Tartrate) .... Take 1 Tab By Mouth Two Times A Day 2)  Keppra 500 Mg Tabs (Levetiracetam) .... Take 1 Tablet By Mouth Two Times A Day 3)  Plaquenil 200 Mg Tabs (Hydroxychloroquine Sulfate) .... Take 1 Tablet By Mouth Two Times A Day 4)  Effexor Xr 75 Mg Cp24 (Venlafaxine Hcl) .... Take 1 Capsule By Mouth Once A Day 5)  Prilosec Otc 20 Mg  Tbec (Omeprazole Magnesium) .... Take 1 Tablet By Mouth Once A Day 6)  Reglan 5 Mg  Tabs (Metoclopramide Hcl) .... Take 1 Tablet By Mouth Four Times A Day 7)  Aspirin 81 Mg  Tbec (Aspirin) .... Take 1 Tablet By Mouth Once A Day 8)  Norvasc 10 Mg Tabs (Amlodipine Besylate) .... Take 1 Tablet By Mouth Once A Day 9)  Ferrous Sulfate 325 (65 Fe) Mg Tabs (Ferrous Sulfate) .... Take 1 Tablet By Mouth Three Times A Day 10)  Lisinopril 10 Mg Tabs (Lisinopril) .... Take 1 Tablet By Mouth Once A Day 11)  Hydrochlorothiazide 25 Mg  Tabs (Hydrochlorothiazide) .... Take 1/2 Tablet By Mouth Once A Day 12)  Capsaicin Apr 0.075 % Crea (Capsaicin) .... Apply On Painful Spot On Forehead Three Times A Day As Needed For Pain 13)  Proventil Hfa 108 (90 Base) Mcg/act Aers (Albuterol Sulfate) .... 2 Puff Q4 Hrs As Needed 14)  Protective Underwear  Medium  Misc (Incontinence Supply Disposable) 15)  Underpads Regular  Misc (Incontinence Supply Disposable) 16)  Loratadine 10 Mg Tabs (Loratadine) .... Take 1 Tablet By Mouth Once A Day 17)  Fluticasone Propionate 50 Mcg/act Susp (Fluticasone Propionate) .Marland Kitchen.. 1 Spray Inside Each Nostril Daily 18)  Ipratropium Bromide 0.02 % Soln (Ipratropium Bromide) .... Nebulizer Treatment Using 0.5 Mg Every 6 Hours.  Allergies: 1)  ! Pcn  Review of Systems  The patient denies chest pain, dyspnea on exertion, peripheral edema, prolonged cough, headaches, hemoptysis, abdominal pain, and melena.    Physical Exam  General:  alert and well-hydrated.   Head:  normocephalic.   Lungs:  normal  respiratory effort, no intercostal retractions, no accessory muscle use, and normal breath sounds.   Heart:  normal rate and regular rhythm.   Abdomen:  soft, non-tender, normal bowel sounds, no distention, no masses, and no guarding.   Neurologic:  Right side weakness, chronic, hyperreflexia of right arm.    Impression & Recommendations:  Problem # 1:  HEADACHE (ICD-784.0) No worseing neuro symptoms. Her headache is frontal area on prior surgery scar. I will prescribe tylenol for pain. Her updated medication list for this problem includes:    Metoprolol Tartrate 50 Mg Tabs (Metoprolol tartrate) .Marland Kitchen... Take 1 tab by mouth two times a day    Aspirin 81 Mg Tbec (Aspirin) .Marland Kitchen... Take 1 tablet by mouth once a day  Problem # 2:  HYPERTENSION (ICD-401.9) Her blood pressure is well controlled. Denies lightheadness. Caretaker relates that blood pressure at home is inthe 120 range. I will check Bmet today.  Her updated medication list for this problem includes:    Metoprolol Tartrate 50 Mg Tabs (Metoprolol tartrate) .Marland Kitchen... Take 1 tab by mouth two times a day    Norvasc 10 Mg Tabs (Amlodipine besylate) .Marland Kitchen... Take 1 tablet by mouth once a day    Lisinopril 10 Mg Tabs (Lisinopril) .Marland Kitchen... Take 1 tablet by mouth once a day    Hydrochlorothiazide 25 Mg Tabs (Hydrochlorothiazide) .Marland Kitchen... Take 1/2 tablet by mouth once a day  Orders: T-Comprehensive Metabolic Panel ([id number])  BP today: 103/70 Prior BP: 102/69 (07/04/2009)  Labs Reviewed: K+: 4.4 (06/12/2009) Creat: : 0.80 (06/12/2009)   Chol: 223 (06/12/2009)   HDL: 57 (06/12/2009)   LDL: 146 (06/12/2009)   TG: 102 (06/12/2009)  Problem # 3:  ANEMIA-IRON DEFICIENCY (ICD-280.9) I will check HB, patient on iron for long time.If HB is normal I will stop iron.  Her updated medication list for this problem includes:    Ferrous Sulfate 325 (65 Fe) Mg Tabs (Ferrous sulfate) .Marland Kitchen... Take 1 tablet by mouth three times a day  Orders: T-CBC w/Diff  ([id number])  Problem # 4:  INSOMNIA (ICD-780.52) I will precribe ambien for insomnia.  Her updated medication list for this problem includes:    Ambien 5 Mg Tabs (Zolpidem tartrate) .Marland Kitchen... Take 1 tablet by mouth daily.  Problem # 5:  HYPONATREMIA (ICD-276.1) I will check bmet. If Na is low will decrease HCTZ.   Complete Medication List: 1)  Metoprolol Tartrate 50 Mg Tabs (Metoprolol tartrate) .... Take 1 tab by mouth two times a day 2)  Keppra 500 Mg Tabs (Levetiracetam) .... Take 1 tablet by mouth two times a day 3)  Plaquenil 200 Mg Tabs (Hydroxychloroquine sulfate) .... Take 1 tablet by mouth two times a day 4)  Effexor Xr 75 Mg Cp24 (Venlafaxine hcl) .... Take 1 capsule by mouth once a day 5)  Prilosec Otc 20 Mg  Tbec (Omeprazole magnesium) .... Take 1 tablet by mouth once a day 6)  Reglan 5 Mg Tabs (Metoclopramide hcl) .... Take 1 tablet by mouth four times a day 7)  Aspirin 81 Mg Tbec (Aspirin) .... Take 1 tablet by mouth once a day 8)  Norvasc 10 Mg Tabs (Amlodipine besylate) .... Take 1 tablet by mouth once a day 9)  Ferrous Sulfate 325 (65 Fe) Mg Tabs (Ferrous sulfate) .... Take 1 tablet by mouth three times a day 10)  Lisinopril 10 Mg Tabs (Lisinopril) .... Take 1 tablet by mouth once a day 11)  Hydrochlorothiazide 25 Mg Tabs (Hydrochlorothiazide) .... Take 1/2 tablet by mouth once a day 12)  Capsaicin Apr 0.075 % Crea (Capsaicin) .... Apply on painful spot on forehead three times a day as needed for pain 13)  Proventil Hfa 108 (90 Base) Mcg/act Aers (Albuterol sulfate) .... 2 puff q4 hrs as needed 14)  Protective Underwear Medium Misc (Incontinence supply disposable) 15)  Underpads Regular Misc (Incontinence supply disposable) 16)  Loratadine 10 Mg Tabs (Loratadine) .... Take 1 tablet by mouth once a day 17)  Fluticasone Propionate 50 Mcg/act Susp (Fluticasone propionate) .Marland Kitchen.. 1 spray inside each nostril daily 18)  Ipratropium Bromide 0.02 % Soln (Ipratropium bromide) ....  Nebulizer treatment using 0.5 mg every 6 hours. 19)  Ambien 5 Mg Tabs (Zolpidem tartrate) .... Take 1 tablet by mouth daily.  Patient Instructions: 1)  Please schedule a follow-up appointment in 2 months with PCP. 2)  Take 650-1000mg  of Tylenol every 4-6 hours as needed for relief of pain or comfort of fever AVOID taking more than 4000mg   in a 24 hour period (can cause liver damage in higher doses). Prescriptions: AMBIEN 5 MG TABS (ZOLPIDEM TARTRATE) Take 1 tablet by mouth daily.  #15 x 0   Entered and Authorized by:   Hartley Barefoot MD   Signed by:   Hartley Barefoot MD on 09/19/2009   Method used:   Print then Give to Patient   RxID:   [id number]   Process Orders Check Orders Results:     [facility]: Check successful Tests Sent for requisitioning (Sep 19, 2009 7:19 PM):     09/19/2009: [facility] -- T-CBC w/Diff [[id number]] (signed)     09/19/2009: [facility] -- T-Comprehensive Metabolic Panel [phone] (signed)

## 2010-06-17 NOTE — Progress Notes (Signed)
Summary: refill/ hla  ---- Converted from flag ---- ---- 10/25/2009 4:54 PM, Mamie Hague NT II wrote: patient needs refill on ambien, the caretaker is calling.thanks mamie ------------------------------

## 2010-06-17 NOTE — Assessment & Plan Note (Signed)
Summary: PATIENT WILL ARRIVE AT 9:00AM /PER MAMIE/SB.   Vital Signs:  Patient profile:   55 year old female Height:      70 inches (177.80 cm) Weight:      200.9 pounds (91.32 kg) BMI:     28.93 O2 Sat:      96 % Temp:     97.0 degrees F (36.11 degrees C) oral Pulse rate:   84 / minute BP sitting:   114 / 75  (left arm) Cuff size:   large  Vitals Entered By: Cynda Familia Duncan Dull) (March 24, 2010 10:25 AM) CC: FOR ABOUT A YEAR OR MORE / AIDE STATES THAT HER FEET WAS SWOLLEN AND HOT, Depression Is Patient Diabetic? No Pain Assessment Patient in pain? yes     Location: BILATERAL HEEL Intensity:         7 Onset of pain  FOR ABOUT A YEAR OR MORE Nutritional Status BMI of 25 - 29 = overweight  Have you ever been in a relationship where you felt threatened, hurt or afraid?No   Does patient need assistance? Functional Status Cook/clean, Shopping Ambulation Impaired:Risk for fall, Wheelchair Comments arrived via wheelchair  / ? TO GET PNEUMO SHOT  / FLU SHOT TODAY   Primary Care Provider:  Bethel Born MD  CC:  FOR ABOUT A YEAR OR MORE / AIDE STATES THAT HER FEET WAS SWOLLEN AND HOT and Depression.  History of Present Illness: Pt is a 55 yo AAf with PMH of HTN, depression, sle, Hx of CVA, and asthma who came here for regular f/u. She has exertional SOB, chronic, no fever, CP, and her right foot sometime swollen, has run out of Startex and has sleep problem after that. She also has difficulty in using her albuterol inhaler. No diarrhea or dysuria. Denies smoking, or drug abuse.   Depression History:      The patient is having a depressed mood most of the day but denies diminished interest in her usual daily activities.        Comments:  GETS IN "HER MOODS" STATES HER CARE TAKER.   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) 11)  Other Disorders of Bone and Cartilage Other  (ICD-733.99) 12)  Vaginitis, Candidal  (ICD-112.1) 13)  Encounter For Long-term Use of Other Medications  (ICD-V58.69) 14)  Lesion, Scalp  (ICD-910.8) 15)  Follow-up Examination Following Other Surgery  (ICD-V67.09) 16)  Alopecia  (ICD-704.00) 17)  Symptom, Nausea With Vomiting  (ICD-787.01) 18)  Foot Pain, Bilateral  (ICD-729.5) 19)  Asthma  (ICD-493.90) 20)  Hypokalemia  (ICD-276.8) 21)  Hyponatremia  (ICD-276.1) 22)  Aphasia  (ICD-784.3) 23)  Hemiparesis, Right  (ICD-342.90) 24)  Systemic Lupus Erythematosus  (ICD-710.0) 25)  Pericarditis, Acute  (ICD-420.90) 26)  Menorrhagia  (ICD-626.2) 27)  Cerebrovascular Accident, Hx of  (ICD-V12.50) 28)  Seizure Disorder  (ICD-780.39) 29)  Hypertension  (  ICD-401.9) 30)  Gerd  (ICD-530.81) 31)  Depression  (ICD-311) 32)  Anemia-iron Deficiency  (ICD-280.9)  Medications Prior to Update: 1)  Metoprolol Tartrate 50 Mg Tabs (Metoprolol Tartrate) .... Take 1 Tab By Mouth Two Times A Day 2)  Keppra 500 Mg Tabs (Levetiracetam) .... Take 1 Tablet By Mouth Two Times A Day 3)  Plaquenil 200 Mg Tabs (Hydroxychloroquine Sulfate) .... Take 1 Tablet By Mouth Two Times A Day 4)  Effexor Xr 75 Mg Cp24 (Venlafaxine Hcl) .... Take 1 Capsule By Mouth Once A Day 5)  Prilosec Otc 20 Mg  Tbec (Omeprazole Magnesium) .... Take 1 Tablet By Mouth Once A Day 6)  Reglan 5 Mg  Tabs (Metoclopramide Hcl) .... Take 1 Tablet By Mouth Four Times A Day 7)  Aspirin 81 Mg  Tbec (Aspirin) .... Take 1  Tablet By Mouth Once A Day 8)  Norvasc 10 Mg Tabs (Amlodipine Besylate) .... Take 1 Tablet By Mouth Once A Day 9)  Lisinopril 10 Mg Tabs (Lisinopril) .... Take 1 Tablet By Mouth Once A Day 10)  Hydrochlorothiazide 25 Mg  Tabs (Hydrochlorothiazide) .... Take 1/2 Tablet By Mouth Once A Day 11)  Capsaicin Apr 0.075 % Crea (Capsaicin) .... Apply On Painful Spot On Forehead Three Times A Day As Needed For Pain 12)  Ventolin Hfa 108 (90 Base) Mcg/act Aers (Albuterol Sulfate) .Marland Kitchen.. 1-2 Puffs As Needed For Wheezing,shortness of Breath or Severe Cough 13)  Protective Underwear Medium  Misc (Incontinence Supply Disposable) 14)  Underpads Regular  Misc (Incontinence Supply Disposable) 15)  Loratadine 10 Mg Tabs (Loratadine) .... Take 1 Tablet By Mouth Once A Day 16)  Fluticasone Propionate 50 Mcg/act Susp (Fluticasone Propionate) .Marland Kitchen.. 1 Spray Inside Each Nostril Daily 17)  Ipratropium Bromide 0.02 % Soln (Ipratropium Bromide) .... Nebulizer Treatment Using 0.5 Mg Every 6 Hours. 18)  Ambien 5 Mg Tabs (Zolpidem Tartrate) .... Take 1 Tablet By Mouth Daily. 19)  Wheelchair  Misc (Misc. Devices)  Current Medications (verified): 1)  Metoprolol Tartrate 50 Mg Tabs (Metoprolol Tartrate) .... Take 1 Tab By Mouth Two Times A Day 2)  Keppra 500 Mg Tabs (Levetiracetam) .... Take 1 Tablet By Mouth Two Times A Day 3)  Plaquenil 200 Mg Tabs (Hydroxychloroquine Sulfate) .... Take 1 Tablet By Mouth Two Times A Day 4)  Effexor Xr 75 Mg Cp24 (Venlafaxine Hcl) .... Take 1 Capsule By Mouth Once A Day 5)  Prilosec Otc 20 Mg  Tbec (Omeprazole Magnesium) .... Take 1 Tablet By Mouth Once A Day 6)  Reglan 5 Mg  Tabs (Metoclopramide Hcl) .... Take 1 Tablet By Mouth Four Times A Day 7)  Aspirin 81 Mg  Tbec (Aspirin) .... Take 1 Tablet By Mouth Once A Day 8)  Norvasc 10 Mg Tabs (Amlodipine Besylate) .... Take 1 Tablet By Mouth Once A Day 9)  Lisinopril 10 Mg Tabs (Lisinopril) .... Take 1 Tablet By Mouth Once A Day 10)   Hydrochlorothiazide 25 Mg  Tabs (Hydrochlorothiazide) .... Take 1/2 Tablet By Mouth Once A Day 11)  Capsaicin Apr 0.075 % Crea (Capsaicin) .... Apply On Painful Spot On Forehead Three Times A Day As Needed For Pain 12)  Ventolin Hfa 108 (90 Base) Mcg/act Aers (Albuterol Sulfate) .Marland Kitchen.. 1-2 Puffs As Needed For Wheezing,shortness of Breath or Severe Cough 13)  Protective Underwear Medium  Misc (Incontinence Supply Disposable) 14)  Underpads Regular  Misc (Incontinence Supply Disposable) 15)  Loratadine 10 Mg Tabs (Loratadine) .... Take 1 Tablet By Mouth Once A Day  16)  Fluticasone Propionate 50 Mcg/act Susp (Fluticasone Propionate) .Marland Kitchen.. 1 Spray Inside Each Nostril Daily 17)  Ipratropium Bromide 0.02 % Soln (Ipratropium Bromide) .... Nebulizer Treatment Using 0.5 Mg Every 6 Hours. 18)  Ambien 5 Mg Tabs (Zolpidem Tartrate) .... Take 1 Tablet By Mouth Daily. 19)  Wheelchair  Misc (Misc. Devices)  Allergies: 1)  ! 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 unknown  Social History: Reviewed history from 01/25/2008 and no changes required. 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  The patient denies fever, decreased hearing, chest pain, syncope, prolonged cough, headaches, hemoptysis, abdominal pain, melena, and hematochezia.    Physical Exam  General:  alert, well-developed, well-nourished, and well-hydrated.  Wheelchair bounded Nose:  no nasal discharge.   Mouth:  pharynx pink and moist.   Neck:  supple.   Lungs:  normal respiratory effort, no accessory muscle use, normal breath sounds, and no crackles.  very mild wheezing bilaterally. Heart:  normal rate, regular rhythm, no murmur, no gallop, and no JVD.   Abdomen:  soft, non-tender, normal bowel sounds, no distention, and no masses.   Msk:  normal ROM, no joint tenderness, no joint swelling, and no joint warmth.  Right foot has chronic venous static change, no erythema or significant edema.  Pulses:  2+ Extremities:  trace right pedal edema.  trace right pedal edema.   Neurologic:  alert & oriented X3, cranial nerves II-XII intact, strength normal in all extremities, sensation intact to light touch, and gait normal.  alert & oriented X3, cranial nerves II-XII intact, strength normal in all extremities, sensation intact to light touch, and gait normal.     Impression & Recommendations:  Problem # 1:  HYPERTENSION (ICD-401.9) Assessment Improved At goal and will continue current meds and recheck at next visit.  Her updated medication list for this problem includes:    Metoprolol Tartrate 50 Mg Tabs (Metoprolol tartrate) .Marland Kitchen... Take 1 tab by mouth two times a day    Norvasc 10 Mg Tabs (Amlodipine besylate) .Marland Kitchen... Take 1 tablet by mouth once a day    Lisinopril 10 Mg Tabs (Lisinopril) .Marland Kitchen... Take 1 tablet by mouth once a day    Hydrochlorothiazide 25 Mg Tabs (Hydrochlorothiazide) .Marland Kitchen... Take 1/2 tablet by mouth once a day  BP today: 114/75 Prior BP: 102/72 (10/10/2009)  Labs Reviewed: K+: 4.4 (09/19/2009) Creat: : 0.73 (09/19/2009)   Chol: 223  (06/12/2009)   HDL: 57 (06/12/2009)   LDL: 146 (06/12/2009)   TG: 102 (06/12/2009)  Problem # 2:  SWELLING OF LIMB (ICD-729.81) Assessment: Unchanged Her right foot has trace edema and seems chronic venous static change. Advised use of HCTZ and elevation of right foot regularly.    Problem #  3:  ASTHMA (ICD-493.90) Assessment: Unchanged  She could not use inhaler, but okey with nebs. Will change to nebs. She has very mild wheezing. Will give flu shot today. The following medications were removed from the medication list:    Ventolin Hfa 108 (90 Base) Mcg/act Aers (Albuterol sulfate) .Marland Kitchen... 1-2 puffs as needed for wheezing,shortness of breath or severe cough Her updated medication list for this problem includes:    Ipratropium Bromide 0.02 % Soln (Ipratropium bromide) ..... Nebulizer treatment using 0.5 mg every 6 hours.    Albuterol Sulfate (5 Mg/ml) 0.5% Nebu (Albuterol sulfate) .Marland Kitchen... 1-2 puffsevery 4-6 hours as needed for shortness of breath.  Pulmonary Functions Reviewed: O2 sat: 96 (03/24/2010)  Problem # 4:  INSOMNIA (ICD-780.52) Assessment: Unchanged She has sleep problem after running out of ambien. Will refill this for her.  Her updated medication list for this problem includes:    Ambien 5 Mg Tabs (Zolpidem tartrate) .Marland Kitchen... Take 1 tablet by mouth daily.  Discussed sleep hygiene.   Complete Medication List: 1)  Metoprolol Tartrate 50 Mg Tabs (Metoprolol tartrate) .... Take 1 tab by mouth two times a day 2)  Keppra 500 Mg Tabs (Levetiracetam) .... Take 1 tablet by mouth two times a day 3)  Plaquenil 200 Mg Tabs (Hydroxychloroquine sulfate) .... Take 1 tablet by mouth two times a day 4)  Effexor Xr 75 Mg Cp24 (Venlafaxine hcl) .... Take 1 capsule by mouth once a day 5)  Prilosec Otc 20 Mg Tbec (Omeprazole magnesium) .... Take 1 tablet by mouth once a day 6)  Reglan 5 Mg Tabs (Metoclopramide hcl) .... Take 1 tablet by mouth four times a day 7)  Aspirin 81 Mg Tbec (Aspirin) ....  Take 1 tablet by mouth once a day 8)  Norvasc 10 Mg Tabs (Amlodipine besylate) .... Take 1 tablet by mouth once a day 9)  Lisinopril 10 Mg Tabs (Lisinopril) .... Take 1 tablet by mouth once a day 10)  Hydrochlorothiazide 25 Mg Tabs (Hydrochlorothiazide) .... Take 1/2 tablet by mouth once a day 11)  Capsaicin Apr 0.075 % Crea (Capsaicin) .... Apply on painful spot on forehead three times a day as needed for pain 12)  Protective Underwear Medium Misc (Incontinence supply disposable) 13)  Underpads Regular Misc (Incontinence supply disposable) 14)  Loratadine 10 Mg Tabs (Loratadine) .... Take 1 tablet by mouth once a day 15)  Fluticasone Propionate 50 Mcg/act Susp (Fluticasone propionate) .Marland Kitchen.. 1 spray inside each nostril daily 16)  Ipratropium Bromide 0.02 % Soln (Ipratropium bromide) .... Nebulizer treatment using 0.5 mg every 6 hours. 17)  Ambien 5 Mg Tabs (Zolpidem tartrate) .... Take 1 tablet by mouth daily. 18)  Wheelchair Misc (Misc. devices) 19)  Albuterol Sulfate (5 Mg/ml) 0.5% Nebu (Albuterol sulfate) .Marland Kitchen.. 1-2 puffsevery 4-6 hours as needed for shortness of breath.  Other Orders: Flu Vaccine 20yrs + MEDICARE PATIENTS (Z6109) Administration Flu vaccine - MCR (U0454)  Patient Instructions: 1)  Please schedule a follow-up appointment in 3-4 months. 2)  Check your feet each night for sore areas, calluses or signs of infection. Prescriptions: KEPPRA 500 MG TABS (LEVETIRACETAM) Take 1 tablet by mouth two times a day  #60 x 5   Entered and Authorized by:   Jackson Latino MD   Signed by:   Jackson Latino MD on 03/24/2010   Method used:   Faxed to ...       [facility] (retail)       [address]  [place], [place]  [address]       Ph: [phone]       Fax: [phone]   RxID:   [id number] METOPROLOL TARTRATE 50 MG TABS (METOPROLOL TARTRATE) Take 1 tab by mouth two times a day  #60 x 11   Entered and Authorized by:   Jackson Latino MD    Signed by:   Jackson Latino MD on 03/24/2010   Method used:   Faxed to ...       [facility] (retail)       [address]       [place], [place]  [address]       Ph: [phone]       Fax: [phone]   RxID:   [id number] IPRATROPIUM BROMIDE 0.02 % SOLN (IPRATROPIUM BROMIDE) nebulizer treatment using 0.5 mg every 6 hours.  #1box x 3   Entered and Authorized by:   Jackson Latino MD   Signed by:   Jackson Latino MD on 03/24/2010   Method used:   Print then Give to Patient   RxID:   [id number] ALBUTEROL SULFATE (5 MG/ML) 0.5% NEBU (ALBUTEROL SULFATE) 1-2 puffsevery 4-6 hours as needed for shortness of breath.  #1 x 3   Entered and Authorized by:   Jackson Latino MD   Signed by:   Jackson Latino MD on 03/24/2010   Method used:   Print then Give to Patient   RxID:   [id number] AMBIEN 5 MG TABS (ZOLPIDEM TARTRATE) Take 1 tablet by mouth daily.  #30 x 2   Entered and Authorized by:   Jackson Latino MD   Signed by:   Jackson Latino MD on 03/24/2010   Method used:   Print then Give to Patient   RxID:   [id number]    Orders Added: 1)  Flu Vaccine 32yrs + MEDICARE PATIENTS [Q2039] 2)  Administration Flu vaccine - MCR [G0008] 3)  Est. Patient Level IV [[address]]      Prevention & Chronic Care Immunizations   Influenza vaccine: Fluvax 3+  (03/24/2010)   Influenza vaccine deferral: Refused  (06/12/2009)    Tetanus booster: Not documented   Td booster deferral: Refused  (06/12/2009)    Pneumococcal vaccine: Pneumovax  (03/10/2005)  Colorectal Screening   Hemoccult: Not documented   Hemoccult action/deferral: Ordered  (10/10/2009)   Hemoccult due: 10/11/2010    Colonoscopy: Results: Normal.   (10/25/2006)   Colonoscopy due: 10/24/2016  Other Screening   Pap smear: Specimen Adequacy: Satisfactory for evaluation.   Interpretation/Result:Negative for intraepithelial Lesion or Malignancy.     (12/29/2005)   Pap smear  action/deferral: Not indicated S/P hysterectomy  (06/12/2009)    Mammogram: Not documented   Mammogram action/deferral: Ordered  (10/10/2009)   Smoking status: quit  (03/24/2010)  Lipids   Total Cholesterol: 223  (06/12/2009)   LDL: 146  (06/12/2009)   LDL Direct: Not documented   HDL: 57  (06/12/2009)   Triglycerides: 102  (06/12/2009)  Hypertension   Last Blood Pressure: 114 / 75  (03/24/2010)   Serum creatinine: 0.73  (09/19/2009)   Serum potassium 4.4  (09/19/2009)    Hypertension flowsheet reviewed?: Yes   Progress toward BP goal: At goal  Self-Management Support :    Hypertension self-management support: Not documented   Nursing Instructions: Give Flu vaccine today  Flu Vaccine Consent Questions     Do you have a history of severe  allergic reactions to this vaccine? no    Any prior history of allergic reactions to egg and/or gelatin? no    Do you have a sensitivity to the preservative Thimersol? no    Do you have a past history of Guillan-Barre Syndrome? no    Do you currently have an acute febrile illness? no    Have you ever had a severe reaction to latex? no    Vaccine information given and explained to patient? yes    Are you currently pregnant? no    Lot Number:[id number]   Exp Date:11/15/2010   Manufacturer: [employer]    Site Given  Left Deltoid IM.Cynda Familia [facility])  March 24, 2010 11:26 AM Blood Pressure: 114 / 75  (03/24/2010)   Serum creatinine: 0.73  (09/19/2009)   Serum potassium 4.4  (09/19/2009)    Hypertension flowsheet reviewed?: Yes   Progress toward BP goal: At goal  Self-Management Support :    Hypertension self-management support: Not documented   Nursing Instructions: Give Flu vaccine today   .medflu

## 2010-06-17 NOTE — Progress Notes (Signed)
Summary: med refill/gp  Phone Note Refill Request Message from:  Fax from Pharmacy on December 16, 2009 9:34 AM  Refills Requested: Medication #1:  PLAQUENIL 200 MG TABS Take 1 tablet by mouth two times a day   Dosage confirmed as above?Dosage Confirmed   Last Refilled: 11/16/2009 Last appt. May 26; next appt. Aug. .   Method Requested: Telephone to Pharmacy Initial call taken by: Chinita Pester RN,  December 16, 2009 9:34 AM  Follow-up for Phone Call        Refill approved-nurse to complete Follow-up by: Bethel Born MD,  December 16, 2009 10:38 AM    Prescriptions: PLAQUENIL 200 MG TABS (HYDROXYCHLOROQUINE SULFATE) Take 1 tablet by mouth two times a day  #60 x 11   Entered and Authorized by:   Bethel Born MD   Signed by:   Bethel Born MD on 12/16/2009   Method used:   Telephoned to ...       [facility] (retail)       [address]       [place], [place]  [address]       Ph: [phone]       Fax: [phone]   RxID:   [id number]   Appended Document: med refill/gp Plaquenil  refill called to [facility].

## 2010-06-17 NOTE — Progress Notes (Signed)
Summary: bilateral ankle swelling over the weekend    Phone Note Call from Patient   Caller: caretaker Call For: Bethel Born MD Reason for Call: Talk to Doctor Details for Reason: swelling in both ankles Summary of Call: JANIE DARK- caretakerfor patient is calling to let us know that patient has bilateral ankle swelling over the weekend.The caretaker would like to know what can she do?  Initial call taken by: Filomena Jungling NT II,  December 09, 2009 10:05 AM  Follow-up for Phone Call        i have tried to call pt, a gentleman that answered stated pt was at the dr's office. will try later. Follow-up by: Marin Roberts RN,  December 10, 2009 8:56 AM  Additional Follow-up for Phone Call Additional follow up Details #1::        i have now scheduled an appt, pt's caregiver, Wille Celeste is instructed that if swelling becomes worse or any other complaints cannot wait until tues 8/2 then py may use urg care or Ed this weeekend. she is agreeable Additional Follow-up by: Marin Roberts RN,  December 13, 2009 4:23 PM

## 2010-06-17 NOTE — Progress Notes (Signed)
Summary: refill/ hla  Phone Note Refill Request Message from:  Patient on October 28, 2009 10:25 AM  Refills Requested: Medication #1:  AMBIEN 5 MG TABS Take 1 tablet by mouth daily..   Last Refilled: 5/5 Initial call taken by: Marin Roberts RN,  October 28, 2009 10:25 AM  Follow-up for Phone Call        Refill approved-nurse to complete Follow-up by: Bethel Born MD,  October 28, 2009 11:10 AM    Prescriptions: AMBIEN 5 MG TABS (ZOLPIDEM TARTRATE) Take 1 tablet by mouth daily.  #30 x 0   Entered and Authorized by:   Bethel Born MD   Signed by:   Bethel Born MD on 10/28/2009   Method used:   Telephoned to ...       [facility] (retail)       [address]       [place], [place]  [address]       Ph: [phone]       Fax: [phone]   RxID:   [id number]

## 2010-06-17 NOTE — Miscellaneous (Signed)
Summary: Abx treament for UTI  Clinical Lists Changes  Medications: Added new medication of CIPRO 500 MG TABS (CIPROFLOXACIN HCL) 1 tab two times a day for 7 days - Signed Rx of CIPRO 500 MG TABS (CIPROFLOXACIN HCL) 1 tab two times a day for 7 days;  #14 x 0;  Signed;  Entered by: Bethel Born MD;  Authorized by: Bethel Born MD;  Method used: Telephoned to [facility], [address] [place], [place], [place]  [address], Ph: [phone], Fax: [phone]    Prescriptions: CIPRO 500 MG TABS (CIPROFLOXACIN HCL) 1 tab two times a day for 7 days  #14 x 0   Entered and Authorized by:   Bethel Born MD   Signed by:   Bethel Born MD on 06/17/2009   Method used:   Telephoned to ...       [facility] (retail)       [address]       [place], [place]  [address]       Ph: [phone]       Fax: [phone]   RxID:   [id number]

## 2010-06-17 NOTE — Progress Notes (Signed)
Summary: phone/gg  Phone Note Call from Patient   Summary of Call: Pt called with c/o rt shoulder area swollen and wants to be seen. I called back to get more info and talked with son.  I gave pt appointment for this thursday and he was going to talk with pt's nurse and call back is she needs to be seen sooner.  I was not able to talk with pt Initial call taken by: Merrie Roof RN,  Oct 07, 2009 4:40 PM

## 2010-06-17 NOTE — Assessment & Plan Note (Signed)
Summary: cough, cold x 3 days, chest hurts when cough, lethargic, Rfoo...   Vital Signs:  Patient profile:   55 year old female Height:      70 inches (177.80 cm) Weight:      194.5 pounds (88.41 kg) BMI:     28.01 O2 Sat:      96 % on Room air Temp:     98.2 degrees F (36.78 degrees C) oral Pulse rate:   83 / minute BP sitting:   102 / 69  (left arm)  Vitals Entered By: Stanton Kidney Ditzler RN (July 04, 2009 10:02 AM)  O2 Flow:  Room air Is Patient Diabetic? No Pain Assessment Patient in pain? yes     Location: left foot Intensity: ? Type: ? Onset of pain  swollen recently Nutritional Status BMI of 25 - 29 = overweight Nutritional Status Detail appetite down  Have you ever been in a relationship where you felt threatened, hurt or afraid?denies   Does patient need assistance? Functional Status Self care, Cook/clean, Shopping, Social activities Ambulation Wheelchair Comments Care giver with pt. Past 4 days clear, white to yellow productive cough. Head congestion.   Primary Care Provider:  Bethel Born MD   History of Present Illness: 55 y/o female with pmh with pmh of athma, CVA, GERD, depression, HTN and SLE; who comes to the clinic complaining of nasal congestion, productive cough, fever, HA's, PND and worsening SOB and wheezing for the last 4 days. Patient reports that her son was having symptoms of URI just 1 week ago and now she feels that she caught the cold.  She denies chest pain, palpitations, blurred vision and new muscle deficit or weakness.  BP today was 102/69.  Patient reports complete resolution of her dysuria and UTI.  Depression History:      The patient denies a depressed mood most of the day and a diminished interest in her usual daily activities.        The patient denies that she feels like life is not worth living, denies that she wishes that she were dead, and denies that she has thought about ending her life.         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) 9)  Other Disorders of Bone and Cartilage Other  (ICD-733.99) 10)  Vaginitis, Candidal  (ICD-112.1) 11)  Encounter For Long-term Use of Other Medications  (ICD-V58.69) 12)  Lesion, Scalp  (ICD-910.8) 13)  Follow-up Examination Following Other Surgery  (ICD-V67.09) 14)  Alopecia  (ICD-704.00) 15)  Symptom, Nausea With Vomiting  (ICD-787.01) 16)  Foot Pain, Bilateral  (ICD-729.5) 17)  Asthma  (ICD-493.90) 18)  Hypokalemia  (ICD-276.8) 19)  Hyponatremia  (ICD-276.1) 20)  Aphasia  (ICD-784.3) 21)  Hemiparesis, Right  (ICD-342.90) 22)  Systemic Lupus Erythematosus  (ICD-710.0) 23)  Pericarditis, Acute  (ICD-420.90) 24)  Menorrhagia  (ICD-626.2) 25)  Cerebrovascular Accident, Hx of  (ICD-V12.50) 26)  Seizure Disorder  (ICD-780.39) 27)  Hypertension  (ICD-401.9) 28)  Gerd  (  ICD-530.81) 29)  Depression  (ICD-311) 30)  Anemia-iron Deficiency  (ICD-280.9)  Current Problems (verified): 1)  Headache  (ICD-784.0) 2)  Screening, Colon Cancer  (ICD-V76.51) 3)  Leukocytopenia Unspecified  (ICD-288.50) 4)  Neck Pain  (ICD-723.1) 5)  Dysuria  (ICD-788.1) 6)  Swelling of Limb  (ICD-729.81) 7)  Cough  (ICD-786.2) 8)  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) 9)  Other Disorders of Bone and Cartilage Other  (ICD-733.99) 10)  Vaginitis, Candidal  (ICD-112.1) 11)  Encounter For Long-term Use of Other Medications  (ICD-V58.69) 12)  Lesion, Scalp  (ICD-910.8) 13)  Follow-up Examination  Following Other Surgery  (ICD-V67.09) 14)  Alopecia  (ICD-704.00) 15)  Symptom, Nausea With Vomiting  (ICD-787.01) 16)  Foot Pain, Bilateral  (ICD-729.5) 17)  Asthma  (ICD-493.90) 18)  Hypokalemia  (ICD-276.8) 19)  Hyponatremia  (ICD-276.1) 20)  Aphasia  (ICD-784.3) 21)  Hemiparesis, Right  (ICD-342.90) 22)  Systemic Lupus Erythematosus  (ICD-710.0) 23)  Pericarditis, Acute  (ICD-420.90) 24)  Menorrhagia  (ICD-626.2) 25)  Cerebrovascular Accident, Hx of  (ICD-V12.50) 26)  Seizure Disorder  (ICD-780.39) 27)  Hypertension  (ICD-401.9) 28)  Gerd  (ICD-530.81) 29)  Depression  (ICD-311) 30)  Anemia-iron Deficiency  (ICD-280.9)  Medications Prior to Update: 1)  Metoprolol Tartrate 50 Mg Tabs (Metoprolol Tartrate) .... Take 1 Tab By Mouth Two Times A Day 2)  Keppra 500 Mg Tabs (Levetiracetam) .... Take 1 Tablet By Mouth Two Times A Day 3)  Plaquenil 200 Mg Tabs (Hydroxychloroquine Sulfate) .... Take 1 Tablet By Mouth Two Times A Day 4)  Effexor Xr 75 Mg Cp24 (Venlafaxine Hcl) .... Take 1 Capsule By Mouth Once A Day 5)  Prilosec Otc 20 Mg  Tbec (Omeprazole Magnesium) .... Take 1 Tablet By Mouth Once A Day 6)  Reglan 5 Mg  Tabs (Metoclopramide Hcl) .... Take 1 Tablet By Mouth Four Times A Day 7)  Aspirin 81 Mg  Tbec (Aspirin) .... Take 1 Tablet By Mouth Once A Day 8)  Norvasc 10 Mg Tabs (Amlodipine Besylate) .... Take 1 Tablet By Mouth Once A Day 9)  Ferrous Sulfate 325 (65 Fe) Mg Tabs (Ferrous Sulfate) .... Take 1 Tablet By Mouth Three Times A Day 10)  Lisinopril 10 Mg Tabs (Lisinopril) .... Take 1 Tablet By Mouth Once A Day 11)  Hydrochlorothiazide 25 Mg  Tabs (Hydrochlorothiazide) .... Take 1 Tablet By Mouth Once A Day 12)  Capsaicin Apr 0.075 % Crea (Capsaicin) .... Apply On Painful Spot On Forehead Three Times A Day As Needed For Pain 13)  Robitussin Dm 100-10 Mg/50ml Syrp (Dextromethorphan-Guaifenesin) .Marland Kitchen.. 10 Mg Q 4hrs 14)  Proventil Hfa 108 (90 Base) Mcg/act Aers (Albuterol Sulfate)  .... 2 Puff Q4 Hrs As Needed 15)  Protective Underwear Medium  Misc (Incontinence Supply Disposable) 16)  Underpads Regular  Misc (Incontinence Supply Disposable) 17)  Cipro 500 Mg Tabs (Ciprofloxacin Hcl) .Marland Kitchen.. 1 Tab Two Times A Day For 7 Days  Current Medications (verified): 1)  Metoprolol Tartrate 50 Mg Tabs (Metoprolol Tartrate) .... Take 1 Tab By Mouth Two Times A Day 2)  Keppra 500 Mg Tabs (Levetiracetam) .... Take 1 Tablet By Mouth Two Times A Day 3)  Plaquenil 200 Mg Tabs (Hydroxychloroquine Sulfate) .... Take 1 Tablet By Mouth Two Times A Day 4)  Effexor Xr 75 Mg Cp24 (Venlafaxine Hcl) .... Take 1 Capsule By Mouth Once A Day 5)  Prilosec Otc 20 Mg  Tbec (Omeprazole Magnesium) .... Take 1 Tablet By Mouth Once A  Day 6)  Reglan 5 Mg  Tabs (Metoclopramide Hcl) .... Take 1 Tablet By Mouth Four Times A Day 7)  Aspirin 81 Mg  Tbec (Aspirin) .... Take 1 Tablet By Mouth Once A Day 8)  Norvasc 10 Mg Tabs (Amlodipine Besylate) .... Take 1 Tablet By Mouth Once A Day 9)  Ferrous Sulfate 325 (65 Fe) Mg Tabs (Ferrous Sulfate) .... Take 1 Tablet By Mouth Three Times A Day 10)  Lisinopril 10 Mg Tabs (Lisinopril) .... Take 1 Tablet By Mouth Once A Day 11)  Hydrochlorothiazide 25 Mg  Tabs (Hydrochlorothiazide) .... Take 1 Tablet By Mouth Once A Day 12)  Capsaicin Apr 0.075 % Crea (Capsaicin) .... Apply On Painful Spot On Forehead Three Times A Day As Needed For Pain 13)  Robitussin Dm 100-10 Mg/14ml Syrp (Dextromethorphan-Guaifenesin) .Marland Kitchen.. 10 Mg Q 4hrs 14)  Proventil Hfa 108 (90 Base) Mcg/act Aers (Albuterol Sulfate) .... 2 Puff Q4 Hrs As Needed 15)  Protective Underwear Medium  Misc (Incontinence Supply Disposable) 16)  Underpads Regular  Misc (Incontinence Supply Disposable)  Allergies: 1)  ! 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  Physical Exam  General:  alert, well-developed, and well-hydrated.  in milddistressand sitting on her wheelchair. Nose:  nasal congestion, clear nasal discharge, maxillar and frontal sinuses tenderness. Lungs:  Diffuse exp wheezing, positive ronchi, fair air movement. Heart:  normal rate, regular rhythm, no murmur, no rub, and no JVD.   Abdomen:  tender to deep palpation in suprapubic region. No rigidity, no rebound tenderness, no cva tenderness,  Extremities:  dorsalis pedis pulses normal bilaterally, no edema. Neurologic:  Residual right hemiparesis, no new focal deficit. alert & oriented X3.     Impression & Recommendations:  Problem # 1:  UPPER RESPIRATORY INFECTION, ACUTE, WITH BRONCHITIS (ICD-465.9) Patient with symptoms of URI and fever. Will treat her symptomswith avelox for 10 dyas and also will give robitussin for cough and loratadine and fluticasone nasal spray for her increased congestion and nasal secretions.  Her updated medication list for this problem includes:    Aspirin 81 Mg Tbec (Aspirin) .Marland Kitchen... Take 1 tablet by mouth once a day    Robitussin Dm 100-10 Mg/53ml Syrp (Dextromethorphan-guaifenesin) .Marland KitchenMarland KitchenMarland KitchenMarland Kitchen 10 mg q 4hrs    Loratadine 10 Mg Tabs (Loratadine) .Marland Kitchen... Take 1 tablet by mouth once a day  Problem # 2:  ASTHMA (ICD-493.90) Patient instructed to continue using her albuterol as prescribed but will also start maintenance  treatment with atrovent nebs four times a day. Patient will also need after this acute mild exacerbation (most likely due to URI) to continue using either her atrovent or to use advair.  The following medications were removed from the medication list:    Advair Diskus 250-50 Mcg/dose Aepb (Fluticasone-salmeterol) .Marland Kitchen... 1 puff inhale twice a day Her updated medication list for this problem includes:    Proventil Hfa 108 (  90 Base) Mcg/act Aers (Albuterol sulfate) .Marland Kitchen... 2 puff q4 hrs as needed  Problem # 3:  SEIZURE DISORDER (ICD-780.39) Wellcontrolled. Noseizure activity appreciated lately; willcontinue prevention therapy with keppra.  Her updated medication list for this problem includes:    Keppra 500 Mg Tabs (Levetiracetam) .Marland Kitchen... Take 1 tablet by mouth two times a day  Complete Medication List: 1)  Metoprolol Tartrate 50 Mg Tabs (Metoprolol tartrate) .... Take 1 tab by mouth two times a day 2)  Keppra 500 Mg Tabs (Levetiracetam) .... Take 1 tablet by mouth two times a day 3)  Plaquenil 200 Mg Tabs (Hydroxychloroquine sulfate) .... Take 1 tablet by mouth two times a day 4)  Effexor Xr 75 Mg Cp24 (Venlafaxine hcl) .... Take 1 capsule by mouth once a day 5)  Prilosec Otc 20 Mg Tbec (Omeprazole magnesium) .... Take 1 tablet by mouth once a day 6)  Reglan 5 Mg Tabs (Metoclopramide hcl) .... Take 1 tablet by mouth four times a day 7)  Aspirin 81 Mg Tbec (Aspirin) .... Take 1 tablet by mouth once a day 8)  Norvasc 10 Mg Tabs (Amlodipine besylate) .... Take 1 tablet by mouth once a day 9)  Ferrous Sulfate 325 (65 Fe) Mg Tabs (Ferrous sulfate) .... Take 1 tablet by mouth three times a day 10)  Lisinopril 10 Mg Tabs (Lisinopril) .... Take 1 tablet by mouth once a day 11)  Hydrochlorothiazide 25 Mg Tabs (Hydrochlorothiazide) .... Take 1/2 tablet by mouth once a day 12)  Capsaicin Apr 0.075 % Crea (Capsaicin) .... Apply on painful spot on forehead three times a day as needed for pain 13)  Robitussin  Dm 100-10 Mg/48ml Syrp (Dextromethorphan-guaifenesin) .Marland Kitchen.. 10 mg q 4hrs 14)  Proventil Hfa 108 (90 Base) Mcg/act Aers (Albuterol sulfate) .... 2 puff q4 hrs as needed 15)  Protective Underwear Medium Misc (Incontinence supply disposable) 16)  Underpads Regular Misc (Incontinence supply disposable) 17)  Avelox 400 Mg Tabs (Moxifloxacin hcl) .... Take 1 tablet by mouth once a day 18)  Loratadine 10 Mg Tabs (Loratadine) .... Take 1 tablet by mouth once a day 19)  Fluticasone Propionate 50 Mcg/act Susp (Fluticasone propionate) .Marland Kitchen.. 1 spray inside each nostril daily 20)  Atrovent 0.06 % Soln (Ipratropium bromide) .... Nebulizer treatment using 0.5mg  every every 6 hours.  Patient Instructions: 1)  Get plenty of rest, drink lots of clear liquids, and use Tylenol or Ibuprofen for fever and comfort. Return in 7-10 days if you're not better:sooner if you're feeling worse. 2)  Take your medications as prescribed. 3)  Follow a low sodium diet (less than 2 grams daily). 4)  Remember that your HCTZ has been reduced to 1/2 tablet daily. 5)  Please schedule a follow-up appointment in 3 months. Prescriptions: ATROVENT 0.06 % SOLN (IPRATROPIUM BROMIDE) Nebulizer treatment using 0.5mg  every every 6 hours.  #3 boxes x 1   Entered and Authorized by:   Vassie Loll MD   Signed by:   Vassie Loll MD on 07/04/2009   Method used:   Print then Give to Patient   RxID:   [id number] ADVAIR DISKUS 250-50 MCG/DOSE AEPB (FLUTICASONE-SALMETEROL) 1 puff inhale twice a day  #1 x 3   Entered and Authorized by:   Vassie Loll MD   Signed by:   Vassie Loll MD on 07/04/2009   Method used:   Print then Give to Patient   RxID:   [id number] FLUTICASONE PROPIONATE 50 MCG/ACT SUSP (FLUTICASONE PROPIONATE) 1 spray inside each nostril daily  #1  x 0   Entered and Authorized by:   Vassie Loll MD   Signed by:   Vassie Loll MD on 07/04/2009   Method used:   Print then Give to Patient   RxID:    [id number] LORATADINE 10 MG TABS (LORATADINE) Take 1 tablet by mouth once a day  #31 x 0   Entered and Authorized by:   Vassie Loll MD   Signed by:   Vassie Loll MD on 07/04/2009   Method used:   Print then Give to Patient   RxID:   [id number] AVELOX 400 MG TABS (MOXIFLOXACIN HCL) Take 1 tablet by mouth once a day  #10 x 0   Entered and Authorized by:   Vassie Loll MD   Signed by:   Vassie Loll MD on 07/04/2009   Method used:   Print then Give to Patient   RxID:   [id number]    Prevention & Chronic Care Immunizations   Influenza vaccine: Fluvax Non-MCR  (03/15/2008)   Influenza vaccine deferral: Refused  (06/12/2009)    Tetanus booster: Not documented   Td booster deferral: Refused  (06/12/2009)    Pneumococcal vaccine: Pneumovax  (03/10/2005)  Colorectal Screening   Hemoccult: Not documented   Hemoccult action/deferral: Ordered  (06/12/2009)    Colonoscopy: Results: Normal.   (10/25/2006)   Colonoscopy due: 10/2011  Other Screening   Pap smear: Specimen Adequacy: Satisfactory for evaluation.   Interpretation/Result:Negative for intraepithelial Lesion or Malignancy.     (12/29/2005)   Pap smear action/deferral: Not indicated S/P hysterectomy  (06/12/2009)    Mammogram: Not documented   Mammogram action/deferral: Deferred  (06/12/2009)   Smoking status: quit  (07/04/2009)  Lipids   Total Cholesterol: 223  (06/12/2009)   LDL: 146  (06/12/2009)   LDL Direct: Not documented   HDL: 57  (06/12/2009)   Triglycerides: 102  (06/12/2009)  Hypertension   Last Blood Pressure: 102 / 69  (07/04/2009)   Serum creatinine: 0.80  (06/12/2009)   Serum potassium 4.4  (06/12/2009)  Self-Management Support :    Patient will work on the following items until the next clinic visit to reach self-care goals:     Medications and monitoring: take my medicines every day  (07/04/2009)     Eating: drink diet soda or water instead of juice or soda, eat more  vegetables, use fresh or frozen vegetables, eat foods that are low in salt, eat fruit for snacks and desserts, limit or avoid alcohol  (07/04/2009)     Other: does not intend to change salt intake or soda intake - no exercise at present  (06/12/2009)    Hypertension self-management support: Not documented  Appended Document: cough, cold x 3 days, chest hurts when cough, lethargic, Rfoo...     Impression & Recommendations:  Problem # 1:  ASTHMA (ICD-493.90) Medication was change and adjusted to be used with nebulizer. Previous dose was coming just as a nasal spray.  Her updated medication list for this problem includes:    Proventil Hfa 108 (90 Base) Mcg/act Aers (Albuterol sulfate) .Marland Kitchen... 2 puff q4 hrs as needed    Ipratropium Bromide 0.02 % Soln (Ipratropium bromide) ..... Nebulizer treatment using 0.5 mg every 6 hours.   Complete Medication List: 1)  Metoprolol Tartrate 50 Mg Tabs (Metoprolol tartrate) .... Take 1 tab by mouth two times a day 2)  Keppra 500 Mg Tabs (Levetiracetam) .... Take 1 tablet by mouth two times a day 3)  Plaquenil 200 Mg Tabs (Hydroxychloroquine sulfate) .Marland KitchenMarland KitchenMarland Kitchen  Take 1 tablet by mouth two times a day 4)  Effexor Xr 75 Mg Cp24 (Venlafaxine hcl) .... Take 1 capsule by mouth once a day 5)  Prilosec Otc 20 Mg Tbec (Omeprazole magnesium) .... Take 1 tablet by mouth once a day 6)  Reglan 5 Mg Tabs (Metoclopramide hcl) .... Take 1 tablet by mouth four times a day 7)  Aspirin 81 Mg Tbec (Aspirin) .... Take 1 tablet by mouth once a day 8)  Norvasc 10 Mg Tabs (Amlodipine besylate) .... Take 1 tablet by mouth once a day 9)  Ferrous Sulfate 325 (65 Fe) Mg Tabs (Ferrous sulfate) .... Take 1 tablet by mouth three times a day 10)  Lisinopril 10 Mg Tabs (Lisinopril) .... Take 1 tablet by mouth once a day 11)  Hydrochlorothiazide 25 Mg Tabs (Hydrochlorothiazide) .... Take 1/2 tablet by mouth once a day 12)  Capsaicin Apr 0.075 % Crea (Capsaicin) .... Apply on painful spot on  forehead three times a day as needed for pain 13)  Robitussin Dm 100-10 Mg/60ml Syrp (Dextromethorphan-guaifenesin) .Marland Kitchen.. 10 mg q 4hrs 14)  Proventil Hfa 108 (90 Base) Mcg/act Aers (Albuterol sulfate) .... 2 puff q4 hrs as needed 15)  Protective Underwear Medium Misc (Incontinence supply disposable) 16)  Underpads Regular Misc (Incontinence supply disposable) 17)  Avelox 400 Mg Tabs (Moxifloxacin hcl) .... Take 1 tablet by mouth once a day 18)  Loratadine 10 Mg Tabs (Loratadine) .... Take 1 tablet by mouth once a day 19)  Fluticasone Propionate 50 Mcg/act Susp (Fluticasone propionate) .Marland Kitchen.. 1 spray inside each nostril daily 20)  Ipratropium Bromide 0.02 % Soln (Ipratropium bromide) .... Nebulizer treatment using 0.5 mg every 6 hours.  Clinical Lists Changes  Problems: Assessed ASTHMA as comment only - Medication was change and adjusted to be used with nebulizer. Previous dose was coming just as a nasal spray.  Her updated medication list for this problem includes:    Proventil Hfa 108 (90 Base) Mcg/act Aers (Albuterol sulfate) .Marland Kitchen... 2 puff q4 hrs as needed    Ipratropium Bromide 0.02 % Soln (Ipratropium bromide) ..... Nebulizer treatment using 0.5 mg every 6 hours.  Medications: Changed medication from ATROVENT 0.06 % SOLN (IPRATROPIUM BROMIDE) Nebulizer treatment using 0.5mg  every every 6 hours. to IPRATROPIUM BROMIDE 0.02 % SOLN (IPRATROPIUM BROMIDE) nebulizer treatment using 0.5 mg every 6 hours. - Signed Rx of IPRATROPIUM BROMIDE 0.02 % SOLN (IPRATROPIUM BROMIDE) nebulizer treatment using 0.5 mg every 6 hours.;  #1box x 0;  Signed;  Entered by: Vassie Loll MD;  Authorized by: Vassie Loll MD;  Method used: Electronically to [facility] [address] [place], [place]  [id number], Ph: [phone], Fax: [phone]    Prescriptions: IPRATROPIUM BROMIDE 0.02 % SOLN (IPRATROPIUM BROMIDE) nebulizer treatment using 0.5 mg every 6 hours.  #1box x 0   Entered and  Authorized by:   Vassie Loll MD   Signed by:   Vassie Loll MD on 07/04/2009   Method used:   Electronically to        [facility] (retail)       [address]       [place], [place]  [id number]       Ph: [phone]       Fax: [phone]   RxID:   [id number]

## 2010-06-17 NOTE — Progress Notes (Signed)
Summary: refill/gg  Phone Note Refill Request  on February 25, 2010 4:30 PM  Refills Requested: Medication #1:  PROVENTIL HFA 108 (90 BASE) MCG/ACT AERS 2 puff Q4 hrs as needed   Dosage confirmed as above?Dosage Confirmed   Last Refilled: 09/21/2009  Method Requested: Fax to Local Pharmacy Initial call taken by: Merrie Roof RN,  February 25, 2010 4:31 PM  Follow-up for Phone Call       Follow-up by: Bethel Born MD,  February 25, 2010 5:07 PM    Prescriptions: PROVENTIL HFA 108 (90 BASE) MCG/ACT AERS (ALBUTEROL SULFATE) 2 puff Q4 hrs as needed  #1 x 3   Entered and Authorized by:   Bethel Born MD   Signed by:   Bethel Born MD on 02/25/2010   Method used:   Telephoned to ...       [facility] (retail)       [address]       [place], [place]  [address]       Ph: [phone]       Fax: [phone]   RxID:   [id number]   Appended Document: refill/gg Rx faxed in

## 2010-06-17 NOTE — Assessment & Plan Note (Signed)
Summary: shoulder pain/gg   Vital Signs:  Patient profile:   55 year old female Height:      70 inches Weight:      201.0 pounds BMI:     28.94 Temp:     97.4 degrees F oral Pulse rate:   73 / minute BP sitting:   102 / 72  (right arm)  Vitals Entered By: Filomena Jungling NT II (Oct 10, 2009 3:16 PM) CC: SHOULDER , Depression Is Patient Diabetic? No Pain Assessment Patient in pain? yes     Location: bilateral shoulder pain Intensity: 9 Type: aching Onset of pain  1 month Nutritional Status BMI of 25 - 29 = overweight  Have you ever been in a relationship where you felt threatened, hurt or afraid?No   Does patient need assistance? Ambulation Wheelchair Comments has assistants   Primary Care Provider:  Bethel Born MD  CC:  SHOULDER  and Depression.  History of Present Illness: 1. Left shoulder lump for 1 month. reports increase in size and pain. Denies any cough, CP and weakness in Right UE. 2. Hx of hyponatremia --follow up on the labs. 3. HTN --had to go back to 25 mg by mouth dailu of HCTZ due to worsened BP control. Denies any dissiness.  Depression History:      The patient denies a depressed mood most of the day and a diminished interest in her usual daily activities.         Preventive Screening-Counseling & Management  Alcohol-Tobacco     Alcohol drinks/day: 0     Smoking Status: quit     Year Quit: 2004  Caffeine-Diet-Exercise     Does Patient Exercise: no  Current Problems (verified): 1)  Neoplasms Unspec Nature Bone Soft Tissue&skin  (ICD-239.2) 2)  Insomnia  (ICD-780.52) 3)  Upper Respiratory Infection, Acute, With Bronchitis  (ICD-465.9) 4)  Headache  (ICD-784.0) 5)  Screening, Colon Cancer  (ICD-V76.51) 6)  Leukocytopenia Unspecified  (ICD-288.50) 7)  Neck Pain  (ICD-723.1) 8)  Dysuria  (ICD-788.1) 9)  Swelling of Limb  (ICD-729.81) 10)  Cough  (ICD-786.2) 11)  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) 12)  Other Disorders of  Bone and Cartilage Other  (ICD-733.99) 13)  Vaginitis, Candidal  (ICD-112.1) 14)  Encounter For Long-term Use of Other Medications  (ICD-V58.69) 15)  Lesion, Scalp  (ICD-910.8) 16)  Follow-up Examination Following Other Surgery  (ICD-V67.09) 17)  Alopecia  (ICD-704.00) 18)  Symptom, Nausea With Vomiting  (ICD-787.01) 19)  Foot Pain, Bilateral  (ICD-729.5) 20)  Asthma  (ICD-493.90) 21)  Hypokalemia  (ICD-276.8) 22)  Hyponatremia  (ICD-276.1) 23)  Aphasia  (ICD-784.3) 24)  Hemiparesis, Right  (ICD-342.90) 25)  Systemic Lupus Erythematosus  (ICD-710.0) 26)  Pericarditis, Acute  (ICD-420.90) 27)  Menorrhagia  (ICD-626.2) 28)  Cerebrovascular Accident, Hx of  (ICD-V12.50) 29)  Seizure Disorder  (ICD-780.39) 30)  Hypertension  (ICD-401.9) 31)  Gerd  (ICD-530.81) 32)  Depression  (ICD-311) 33)  Anemia-iron Deficiency  (ICD-280.9)  Current Medications (verified): 1)  Metoprolol Tartrate 50 Mg Tabs (Metoprolol Tartrate) .... Take 1 Tab By Mouth Two Times A Day 2)  Keppra 500 Mg Tabs (Levetiracetam) .... Take 1 Tablet By Mouth Two Times A Day 3)  Plaquenil 200 Mg Tabs (Hydroxychloroquine Sulfate) .... Take 1 Tablet By Mouth Two Times A Day 4)  Effexor Xr 75 Mg Cp24 (Venlafaxine Hcl) .... Take 1 Capsule By Mouth Once A Day 5)  Prilosec Otc 20 Mg  Tbec (Omeprazole Magnesium) .Marland KitchenMarland KitchenMarland Kitchen  Take 1 Tablet By Mouth Once A Day 6)  Reglan 5 Mg  Tabs (Metoclopramide Hcl) .... Take 1 Tablet By Mouth Four Times A Day 7)  Aspirin 81 Mg  Tbec (Aspirin) .... Take 1 Tablet By Mouth Once A Day 8)  Norvasc 10 Mg Tabs (Amlodipine Besylate) .... Take 1 Tablet By Mouth Once A Day 9)  Lisinopril 10 Mg Tabs (Lisinopril) .... Take 1 Tablet By Mouth Once A Day 10)  Hydrochlorothiazide 25 Mg  Tabs (Hydrochlorothiazide) .... Take 1/2 Tablet By Mouth Once A Day 11)  Capsaicin Apr 0.075 % Crea (Capsaicin) .... Apply On Painful Spot On Forehead Three Times A Day As Needed For Pain 12)  Proventil Hfa 108 (90 Base) Mcg/act Aers  (Albuterol Sulfate) .... 2 Puff Q4 Hrs As Needed 13)  Protective Underwear Medium  Misc (Incontinence Supply Disposable) 14)  Underpads Regular  Misc (Incontinence Supply Disposable) 15)  Loratadine 10 Mg Tabs (Loratadine) .... Take 1 Tablet By Mouth Once A Day 16)  Fluticasone Propionate 50 Mcg/act Susp (Fluticasone Propionate) .Marland Kitchen.. 1 Spray Inside Each Nostril Daily 17)  Ipratropium Bromide 0.02 % Soln (Ipratropium Bromide) .... Nebulizer Treatment Using 0.5 Mg Every 6 Hours. 18)  Ambien 5 Mg Tabs (Zolpidem Tartrate) .... Take 1 Tablet By Mouth Daily.  Allergies (verified): 1)  ! Pcn  Past History:  Risk Factors: Smoking Status: quit (10/10/2009)  Past medical, surgical, family and social histories (including risk factors) reviewed for relevance to current acute and chronic problems.  Past Medical History: Reviewed history from 03/02/2006 and no changes required. 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 Tegretol Hypokalemia - 2nd diuretic Menorrhagia - Jackson-Moore  Family History: Reviewed history and no changes required. unknown  Social History: Reviewed history from 01/25/2008 and no changes required. 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   Head:  normocephalic.   Eyes:  vision grossly intact, pupils equal, pupils round, and pupils reactive to light.   Ears:  no external deformities.  R ear normal and L ear normal.   Nose:  nasal congestion, clear nasal discharge, maxillar and frontal sinuses tenderness. Mouth:  no oropharyngeal rednesspharynx pink  and moist, poor dentition, and teeth missing.   Neck:  supple, no JVD, no neck stiffness Chest Wall:  Right supraclavivular 2 cm lump with poor margination, immobile and with mild TTP noted. Lungs:  normal respiratory effort, no intercostal retractions, no accessory muscle use, and normal breath sounds.   Heart:  normal rate and regular rhythm.   Abdomen:  soft, non-tender, normal bowel sounds, no distention, no masses, and no guarding.   Msk:  No deformity or scoliosis noted of thoracic or lumbar spine.  Right foot with decreased range of motion on dorsoflexion. Pulses:  R posterior tibial normal, R dorsalis pedis normal, L posterior tibial normal, and L dorsalis pedis normal.   Extremities:  dorsalis pedis pulses normal bilaterally, no edema. Neurologic:  Right side weakness, chronic, hyperreflexia of right arm.  Cervical Nodes:  no anterior cervical adenopathy and no posterior cervical adenopathy.   Axillary Nodes:  no R axillary adenopathy and no L axillary  adenopathy.   Inguinal Nodes:  no R inguinal adenopathy and no L inguinal adenopathy.   Psych:  Oriented X3, memory intact for recent and remote, normally interactive, good eye contact, and not anxious appearing.     Impression & Recommendations:  Problem # 1:  NEOPLASMS UNSPEC NATURE BONE SOFT TISSUE&SKIN (ICD-239.2) Unclear etiology ->lipoma vs cyst. Due to pain, will proceed with an [place] to assess further (solid vs fluid-filled; and f/u accordingly. Orders: Ultrasound (Ultrasound)  Problem # 2:  FOOT PAIN, BILATERAL (ICD-729.5) Chronic feet pain. Likely due to immobility. Refuses PT, although reports that Pt in the past helped to alleviate the Sx. Instructed to call if decided to proceed with PT.  Problem # 3:  HYPONATREMIA (ICD-276.1) Last B-met with Sodium WNL. No change in HCTz dose.  Problem # 4:  HYPERTENSION (ICD-401.9)  Ona hypotensive side. Denies any dizziness. Apparently, tried to decrease HCTZ dose in the past with a  subsequent worsening of HTN. Continue with curent regimen. Her updated medication list for this problem includes:    Metoprolol Tartrate 50 Mg Tabs (Metoprolol tartrate) .Marland Kitchen... Take 1 tab by mouth two times a day    Norvasc 10 Mg Tabs (Amlodipine besylate) .Marland Kitchen... Take 1 tablet by mouth once a day    Lisinopril 10 Mg Tabs (Lisinopril) .Marland Kitchen... Take 1 tablet by mouth once a day    Hydrochlorothiazide 25 Mg Tabs (Hydrochlorothiazide) .Marland Kitchen... Take 1/2 tablet by mouth once a day  BP today: 102/72 Prior BP: 103/70 (09/19/2009)  Labs Reviewed: K+: 4.4 (09/19/2009) Creat: : 0.73 (09/19/2009)   Chol: 223 (06/12/2009)   HDL: 57 (06/12/2009)   LDL: 146 (06/12/2009)   TG: 102 (06/12/2009)  Complete Medication List: 1)  Metoprolol Tartrate 50 Mg Tabs (Metoprolol tartrate) .... Take 1 tab by mouth two times a day 2)  Keppra 500 Mg Tabs (Levetiracetam) .... Take 1 tablet by mouth two times a day 3)  Plaquenil 200 Mg Tabs (Hydroxychloroquine sulfate) .... Take 1 tablet by mouth two times a day 4)  Effexor Xr 75 Mg Cp24 (Venlafaxine hcl) .... Take 1 capsule by mouth once a day 5)  Prilosec Otc 20 Mg Tbec (Omeprazole magnesium) .... Take 1 tablet by mouth once a day 6)  Reglan 5 Mg Tabs (Metoclopramide hcl) .... Take 1 tablet by mouth four times a day 7)  Aspirin 81 Mg Tbec (Aspirin) .... Take 1 tablet by mouth once a day 8)  Norvasc 10 Mg Tabs (Amlodipine besylate) .... Take 1 tablet by mouth once a day 9)  Lisinopril 10 Mg Tabs (Lisinopril) .... Take 1 tablet by mouth once a day 10)  Hydrochlorothiazide 25 Mg Tabs (Hydrochlorothiazide) .... Take 1/2 tablet by mouth once a day 11)  Capsaicin Apr 0.075 % Crea (Capsaicin) .... Apply on painful spot on forehead three times a day as needed for pain 12)  Proventil Hfa 108 (90 Base) Mcg/act Aers (Albuterol sulfate) .... 2 puff q4 hrs as needed 13)  Protective Underwear Medium Misc (Incontinence supply disposable) 14)  Underpads Regular Misc (Incontinence supply  disposable) 15)  Loratadine 10 Mg Tabs (Loratadine) .... Take 1 tablet by mouth once a day 16)  Fluticasone Propionate 50 Mcg/act Susp (Fluticasone propionate) .Marland Kitchen.. 1 spray inside each nostril daily 17)  Ipratropium Bromide 0.02 % Soln (Ipratropium bromide) .... Nebulizer treatment using 0.5 mg every 6 hours. 18)  Ambien 5 Mg Tabs (Zolpidem tartrate) .... Take 1 tablet by mouth daily.  Other Orders: T-Hemoccult Card-Multiple (take home) ([address]) CXR- 2view (CXR)  Mammogram (Screening) (Mammo)  Patient Instructions: 1)  Please schedule a follow-up appointment in 1 month.    Prevention & Chronic Care Immunizations   Influenza vaccine: Fluvax Non-MCR  (03/15/2008)   Influenza vaccine deferral: Refused  (06/12/2009)    Tetanus booster: Not documented   Td booster deferral: Refused  (06/12/2009)    Pneumococcal vaccine: Pneumovax  (03/10/2005)  Colorectal Screening   Hemoccult: Not documented   Hemoccult action/deferral: Ordered  (10/10/2009)   Hemoccult due: 10/11/2010    Colonoscopy: Results: Normal.   (10/25/2006)   Colonoscopy due: 10/24/2016  Other Screening   Pap smear: Specimen Adequacy: Satisfactory for evaluation.   Interpretation/Result:Negative for intraepithelial Lesion or Malignancy.     (12/29/2005)   Pap smear action/deferral: Not indicated S/P hysterectomy  (06/12/2009)    Mammogram: Not documented   Mammogram action/deferral: Ordered  (10/10/2009)   Smoking status: quit  (10/10/2009)  Lipids   Total Cholesterol: 223  (06/12/2009)   LDL: 146  (06/12/2009)   LDL Direct: Not documented   HDL: 57  (06/12/2009)   Triglycerides: 102  (06/12/2009)  Hypertension   Last Blood Pressure: 102 / 72  (10/10/2009)   Serum creatinine: 0.73  (09/19/2009)   Serum potassium 4.4  (09/19/2009)    Hypertension flowsheet reviewed?: Yes   Progress toward BP goal: At goal    Stage of readiness to change (hypertension management): Maintenance  Self-Management Support  :    Hypertension self-management support: Not documented   Nursing Instructions: Provide Hemoccult cards with instructions (see order) Schedule screening mammogram (see order)

## 2010-06-19 NOTE — Progress Notes (Signed)
Summary: med refill/gp  Phone Note Refill Request Message from:  Fax from Pharmacy on May 26, 2010 2:01 PM  Refills Requested: Medication #1:  EFFEXOR XR 75 MG CP24 Take 1 capsule by mouth once a day   Dosage confirmed as above?Dosage Confirmed   Last Refilled: 04/19/2010 Last appt. Mar 24, 2010.   Method Requested: Fax to Local Pharmacy Initial call taken by: Chinita Pester RN,  May 26, 2010 2:02 PM  Follow-up for Phone Call       Follow-up by: Bethel Born MD,  May 26, 2010 2:10 PM    Prescriptions: EFFEXOR XR 75 MG CP24 (VENLAFAXINE HCL) Take 1 capsule by mouth once a day  #30 x 5   Entered and Authorized by:   Bethel Born MD   Signed by:   Bethel Born MD on 05/26/2010   Method used:   Faxed to ...       [facility] (retail)       [address]       [place], [place]  [address]       Ph: [phone]       Fax: [phone]   RxID:   [id number]

## 2010-06-19 NOTE — Progress Notes (Signed)
Summary: swelling in left foot  Phone Note Call from Patient Call back at [phone]/  [phone]   Caller: CARETAKER Call For: Wanda Born MD Details for Reason: SWELLING LEFT FOOT Summary of Call: Phone call from patient caretaker Wanda [name] with complaint of swelling in left foot, patient has elvated foot over the weekend.The patient now has 2 sores on the foot with fluid in them. The caretaker would like a call back as for further instructions. Initial call taken by: Filomena Jungling NT II,  June 09, 2010 10:15 AM  Follow-up for Phone Call        Another call from caretaker/Dark. She states the same concerns but also states pus is coming out of wounds.  Denies fever. We have no appointments today or tomorrow so I advised visit to [facility] for evaluation of feet. Patient/caller verbalizes understanding of these instructions.  Follow-up by: Merrie Roof RN,  June 09, 2010 12:51 PM

## 2010-06-19 NOTE — Progress Notes (Signed)
Summary: Refill/gh  Phone Note Refill Request Message from:  Fax from Pharmacy on May 19, 2010 9:08 AM  Refills Requested: Medication #1:  REGLAN 5 MG  TABS Take 1 tablet by mouth four times a day   Last Refilled: 04/19/2010  Medication #2:  PRILOSEC OTC 20 MG  TBEC Take 1 tablet by mouth once a day   Last Refilled: 04/19/2010 Last office visit was 03/24/2010.  Last labs were 09/19/2009   Method Requested: Electronic Initial call taken by: Angelina Ok RN,  May 19, 2010 9:08 AM  Follow-up for Phone Call        Refill approved-nurse to complete Follow-up by: Ulyess Mort MD,  May 19, 2010 11:29 AM    Prescriptions: REGLAN 5 MG  TABS (METOCLOPRAMIDE HCL) Take 1 tablet by mouth four times a day  #120 x 5   Entered and Authorized by:   Ulyess Mort MD   Signed by:   Ulyess Mort MD on 05/19/2010   Method used:   Faxed to ...       [facility] (retail)       [address]       [place], [place]  [address]       Ph: [phone]       Fax: [phone]   RxID:   [id number] PRILOSEC OTC 20 MG  TBEC (OMEPRAZOLE MAGNESIUM) Take 1 tablet by mouth once a day  #30 x 5   Entered and Authorized by:   Ulyess Mort MD   Signed by:   Ulyess Mort MD on 05/19/2010   Method used:   Faxed to ...       [facility] (retail)       [address]       [place], [place]  [address]       Ph: [phone]       Fax: [phone]   RxID:   [id number]

## 2010-06-19 NOTE — Progress Notes (Signed)
Summary: phone/gg  Phone Note Call from Patient   Summary of Call: Call from pt's nurse stating for 1 1/2 week pt has cold.  Productive cough and wheezing.  SOB  Temp 99.9 decrease appetite.  Has taked OTC meds with no relief Initial call taken by: Merrie Roof RN,  May 15, 2010 9:20 AM  Follow-up for Phone Call        I have reviewed the patients complex medical history and  record-if she is wheezing, febrile and requiring inhalers with more frequency she should go to the ED for evaluation. Follow-up by: Julaine Fusi  DO,  May 15, 2010 9:30 AM  Additional Follow-up for Phone Call Additional follow up Details #1::        Pt's nurse informed of above and will bring to ED for evaluation. Patient/caller verbalizes understanding of these instructions.  Additional Follow-up by: Merrie Roof RN,  May 15, 2010 9:31 AM

## 2010-06-26 ENCOUNTER — Encounter: Payer: Self-pay | Admitting: Internal Medicine

## 2010-07-01 ENCOUNTER — Inpatient Hospital Stay (HOSPITAL_COMMUNITY): Admission: RE | Admit: 2010-07-01 | Payer: Self-pay | Source: Ambulatory Visit

## 2010-07-03 ENCOUNTER — Ambulatory Visit (HOSPITAL_COMMUNITY)
Admission: RE | Admit: 2010-07-03 | Discharge: 2010-07-03 | Disposition: A | Discharge status: Home / Self Care | Payer: Medicaid Other | Source: Ambulatory Visit | Attending: Obstetrics | Admitting: Obstetrics

## 2010-07-03 DIAGNOSIS — Z1231 Encounter for screening mammogram for malignant neoplasm of breast: Secondary | ICD-10-CM | POA: Insufficient documentation

## 2010-07-03 DIAGNOSIS — Z1239 Encounter for other screening for malignant neoplasm of breast: Secondary | ICD-10-CM

## 2010-07-28 LAB — POCT I-STAT, CHEM 8
Calcium, Ion: 1.14 mmol/L (ref 1.12–1.32)
Calcium, Ion: 1.16 mmol/L (ref 1.12–1.32)
Chloride: 99 mEq/L (ref 96–112)
Creatinine, Ser: 0.8 mg/dL (ref 0.4–1.2)
Glucose, Bld: 91 mg/dL (ref 70–99)
Glucose, Bld: 92 mg/dL (ref 70–99)
HCT: 45 % (ref 36.0–46.0)
HCT: 45 % (ref 36.0–46.0)
Hemoglobin: 15.3 g/dL — ABNORMAL HIGH (ref 12.0–15.0)
Hemoglobin: 15.3 g/dL — ABNORMAL HIGH (ref 12.0–15.0)
Potassium: 3.3 mEq/L — ABNORMAL LOW (ref 3.5–5.1)
TCO2: 33 mmol/L (ref 0–100)
TCO2: 34 mmol/L (ref 0–100)

## 2010-07-28 LAB — CBC
Hemoglobin: 13.6 g/dL (ref 12.0–15.0)
MCH: 26.8 pg (ref 26.0–34.0)
MCHC: 32.3 g/dL (ref 30.0–36.0)
MCV: 83 fL (ref 78.0–100.0)
RBC: 5.07 MIL/uL (ref 3.87–5.11)

## 2010-07-28 LAB — DIFFERENTIAL
Basophils Relative: 1 % (ref 0–1)
Eosinophils Absolute: 0.2 10*3/uL (ref 0.0–0.7)
Eosinophils Relative: 4 % (ref 0–5)
Lymphs Abs: 1.1 10*3/uL (ref 0.7–4.0)
Monocytes Absolute: 0.3 10*3/uL (ref 0.1–1.0)
Monocytes Relative: 7 % (ref 3–12)
Neutrophils Relative %: 64 % (ref 43–77)

## 2010-07-29 ENCOUNTER — Telehealth: Payer: Self-pay | Admitting: *Deleted

## 2010-07-29 NOTE — Telephone Encounter (Signed)
Call from pt's aide asking for a Rx hospital bed. She has one from [facility] but it's broken and insurance will pay for a new one. Please write for "Semi- electric hospital bed".  Fax # [phone] for [facility]

## 2010-07-30 ENCOUNTER — Other Ambulatory Visit: Payer: Self-pay | Admitting: Internal Medicine

## 2010-07-30 DIAGNOSIS — M329 Systemic lupus erythematosus, unspecified: Secondary | ICD-10-CM

## 2010-07-30 DIAGNOSIS — Z8679 Personal history of other diseases of the circulatory system: Secondary | ICD-10-CM

## 2010-07-30 NOTE — Telephone Encounter (Signed)
Order puller and completed

## 2010-08-18 ENCOUNTER — Other Ambulatory Visit: Payer: Self-pay | Admitting: *Deleted

## 2010-08-18 MED ORDER — LISINOPRIL 10 MG PO TABS: 10.0000 mg | ORAL_TABLET | Freq: Every day | ORAL | Status: DC

## 2010-09-22 ENCOUNTER — Other Ambulatory Visit: Payer: Self-pay | Admitting: *Deleted

## 2010-09-22 MED ORDER — LEVETIRACETAM 500 MG PO TABS: 500.0000 mg | ORAL_TABLET | Freq: Two times a day (BID) | ORAL | Status: DC

## 2010-09-22 MED ORDER — AMLODIPINE BESYLATE 10 MG PO TABS: 10.0000 mg | ORAL_TABLET | Freq: Every day | ORAL | Status: DC

## 2010-09-30 NOTE — Assessment & Plan Note (Signed)
[facility]                         GASTROENTEROLOGY OFFICE NOTE   NAME:Wanda [name], Wanda [name]                     MRN:          [id number]  DATE:10/05/2006                            DOB:          06-Jun-1955    REASON FOR CONSULTATION:  Heme-positive stool.   Wanda [name] is a 55 year old African American female referred through  the courtesy of Dr. Harvie Junior for evaluation.  Heme positive stool was  noted on routine exam.  Wanda [name] has noticed small amounts of blood  on the toilet tissue. She denies change in bowel habits.  She does  complain of some upper abdominal pain, associated with occasional nausea  or vomiting.  History is limited due to her inability to talk.   Medical history is pertinent for ruptured Berry aneurysm resulting with  a right hemiparesis and expressive aphasia and seizure disorder.  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  She is status  post VATS procedure with decortication and pericardiectomy. History of  coagulopathy and bone infarct.  She has hypertension and diabetes.   FAMILY HISTORY:  Is noncontributory.   MEDICATIONS:  Include:  1. Omeprazole.  2. Hydroxychloroquine.  3. Amlodipine.  4. Effexor.  5. Metoprolol.  6. Metoclopramide.  7. Aspirin.  8. Iron.  9. Lisinopril.   She has no allergies.   She neither smokes nor drinks.  She is widowed and on disability.   REVIEW OF SYSTEMS:  Is positive for frequent cough, feet swelling and  sleeping problems.   PHYSICAL EXAMINATION:  Pulse 78, blood pressure 120/80.  Weight 186.  The patient was examined sitting in a wheelchair.  HEENT: EOMI. PERRLA. Sclerae are anicteric.  Conjunctivae are pink.  NECK:  Supple without thyromegaly, adenopathy or carotid bruits.  CHEST:  Clear to auscultation and  percussion without adventitious  sounds.  CARDIAC:  Regular rhythm; normal S1 S2.  There are no murmurs, gallops  or rubs.  ABDOMEN:  Bowel sounds are normoactive.  Abdomen is soft, non-tender and  non-distended.  There are no abdominal masses, tenderness, splenic  enlargement or hepatomegaly.  EXTREMITIES:  Full range of motion.  No cyanosis, clubbing or edema.  RECTAL:  Deferred.   IMPRESSION:  1. A history of limited rectal bleeding and heme positive stool.      Etiologies including hemorrhoids, polyps, AVM and neoplasm need to      be ruled out.  2. Non-specific upper abdominal pain - rule out ulcer or non-ulcer      dyspepsia.   RECOMMENDATIONS:  Colonoscopy and upper endoscopy (to be done at the  same time).     Barbette Hair. Arlyce Dice, MD,FACG  Electronically Signed    RDK/MedQ  DD: 10/05/2006  DT: 10/05/2006  Job #: [id number]   cc:   Alvester Morin, M.D.

## 2010-09-30 NOTE — Discharge Summary (Signed)
NAME:  Wanda [name], Wanda [name]              ACCOUNT NO.:  [id number]   MEDICAL RECORD NO.:  [id number]          PATIENT TYPE:  INP   LOCATION:  5739                         FACILITY:  [facility]   PHYSICIAN:  Chauncey Reading, D.O.  DATE OF BIRTH:  1955/05/29   DATE OF ADMISSION:  12/14/2006  DATE OF DISCHARGE:  12/16/2006                               DISCHARGE SUMMARY   CHIEF COMPLAINT:  Nausea and vomiting.   PRIMARY CARE PHYSICIAN:  Dr. Harvie Junior.   DISCHARGE DIAGNOSES:  1. Gastroenteritis versus medication-induced nausea.  2. Iron-deficiency anemia.  3. Depression.  4. Gastroesophageal reflux disease.  5. Hypertension.  6. Seizure disorder.  7. 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  8. Systemic lupus erythematosus with history of pericarditis.  9. Menorrhagia.  10.History of hypokalemia and hyponatremia.   DISCHARGE MEDICATIONS:  1. Metoprolol 50 mg p.o. b.i.d.  2. Keppra 500 mg p.o. b.i.d.  3. Plaquenil 200 mg p.o. b.i.d.  4. Effexor XR 75 mg p.o. daily.  5. Prilosec OTC 20 mg p.o. daily.  6. Reglan 5 mg p.o. t.i.d. daily with meals and once before bed.  7. Aspirin 325 p.o. daily.  8. Norvasc 10 mg p.o. daily.  9. Lisinopril 10 p.o. daily.  10.Iron 325 mg p.o. b.i.d. with meals.  11.Albuterol inhaler 90 mcg 1-2 puffs q.4 hours p.r.n. shortness of      breath, wheezing.  12.Ciprofloxacin 400 mg p.o. b.i.d. x1 day.   DISPOSITION:  The patient is to follow up with Dr. Harvie Junior at the  outpatient clinic, phone number [phone].  The date of the appointment  is August 19 at 11:15 a.m.  Patient is to follow up on persistent nausea  and vomiting after discharge, particularly after restarting her  medications.  The patient should also follow up regarding her urinary  tract infection to insure that she has no more dysuria.  Blood pressure  should be checked.  The patient's blood pressure on the date of  discharge was elevated because her  blood pressure medications had been  held.  They were restarted on the date of discharge.  Her blood pressure  was 157/88 at discharge.  Regarding the patient's anemia, she did have a  repeat CBC.  Her hemoglobin was 9.3 at discharge.  Patient should also  have a BMET.  She has a history of hypokalemia and hyponatremia.  The  patient had relatively low potassium level of 3.5 during admission.   PROCEDURE:  None.   CONSULTATIONS:  None.   HISTORY AND PHYSICAL:  Patient is a 55 year old female with past medical  history significant for ruptured berry aneurysm with right hemiparesis  and expressive aphasia.  She also has a history of SLE and pericarditis,  menorrhagia, iron-deficiency anemia.  The patient was seen in clinic one  day prior to admission with nausea and vomiting for the last 2 weeks.  She states that she eats and a few minutes later vomits.  This is  confirmed with home nurse that stays with her.  In the clinic, the  patient denied fevers,  chills, dysphagia, abdominal pain or bloody  stools.  She states that she had one episode of diarrhea and has had  recent EGD and colonoscopy the month prior by Dr. Arlyce Dice.  The patient  was sent home with Phenergan and Cipro for a UTI.  She did not improve  and came to [facility] with persistent nausea and vomiting, as  well as fever and chills.  The patient denied abdominal pain.  She has  chronic constipation, but she has had loose stools for the last 2 days.  She states that she does have an appetite and would like to try to eat.  She endorses cough productive of whitish phlegm.  She denies dysuria.   Vital signs on the date of admission, temperature 97.6, blood pressure  139/82, pulse 79, respirations 18, oxygen saturation 96% on room air.   LABS ON ADMISSION:  Sodium 139, potassium 4.2, chloride 106, bicarb 29,  BUN 12, creatinine 0.67, glucose 84.  Anion gap 4, bilirubin 0.4, alk-  phos 69, SGOT 13, SGPT 13, protein 7.5,  albumin 3.2, calcium 8.8.  White  blood cell count 4.6, hemoglobin 10.5, platelets 5, A1c 2.8, MCV 71, RDW  17.  Last ferritin was in April 2008 and was 9.  Urinalysis was  significant for specific gravity of 1.028.  She was negative for  ketones, but she had a large amount of blood and protein.  Chest x-ray  was significant for no acute changes, although she has a chronic  elevation of the left hemidiaphragm.  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  She had a x-ray of her right  foot that showed no acute findings, although she did have some  osteopenia.  Her cardiac enzymes were negative x1.   HOSPITAL COURSE:  1. Nausea and vomiting.  The differential diagnosis included motility      disorder versus gastroenteritis versus small bowel obstruction      versus gastroparesis.  Also considered were hepatitis,      pancreatitis, urinary tract infection, pyelonephritis, peptic ulcer      disease or possible colitis.  The patient was admitted to [facility] inpatient.  She was made n.p.o. and given IV Zofran      as necessary for her nausea.  A KUB was obtained that showed      constipation without obstruction or ileus.  The patient was given      lactulose.  Her Reglan was continued.  She was given as originally      she was given and as original, the patient had a bowel movement      which seemed to alleviate some of her nausea and vomiting.      Additionally, the patient had an abnormal UA that was significant      for many bacteria; the urinary culture, which is still pending.      She was continued on her ciprofloxacin 400 mg IV that she was on an      outpatient during this hospitalization, to treat a possible urinary      tract infection, which may have been contributing to her nausea.      Additionally, the patient's lisinopril,  Norvasc and iron were      held.  These may have been contributing to her nausea as well,      particularly  the iron which was started recently.  On day 2 of  admission, the patient stated that she was hungry and felt less      nausea and was allowed to eat.  The patient tolerated her regular      diet of dinner and also at breakfast the next morning.  She stated      she felt much better.  At discharge, the patient's medications were      restarted.  She was continued on her ciprofloxacin for one more day      for her possible urinary tract infection.  2. Possible abdominal pain.  The patient had cardiac enzymes cycled,      which were negative, and serial EKGs, which were negative for acute      changes.  It is likely that the patient's chest pain was secondary      to her nausea and vomiting.  3. Hypertension.  Patient's blood pressure medications of Norvasc and      lisinopril were held, given that the patient may have been      dehydrated at admission due to her nausea and vomiting.      Additionally, these medications can contribute to nausea.  The      patient's blood pressure was 139/82 at admission.  On the day of      discharge, the patient's blood pressure had been ranging from 140-      150/80-90.  Her blood pressure medications were restarted at      discharge.  4. Anemia, iron deficient anemia.  The patient had iron studies done      several months prior that revealed that her anemia was secondary to      iron deficiency.  A B12 level was obtained, which was within normal      limits during this hospitalization.  It was felt that maybe this      had also contributed to the patient's nausea nd vomiting, but was      negative.  On the date of discharge, the patient's vital signs were      temperature 98.4, systolic blood pressure 157, diastolic blood      pressure 88, pulse 102, respirations 20, oxygen saturation 97% on      room air.   LABS ON DISCHARGE:  White blood cell count 4.5, hemoglobin 9.3,  hematocrit 29, platelets 404.  Sodium 141, potassium 3.5, chloride 107,   bicarb 27, BUN 4, creatinine 0.57, glucose 87, calcium 8.4.  Urinalysis  repeat was significant for specific gravity 1.005, pH 7, moderate amount  of blood and many bacteria.  Urine culture is still pending.  The GI  studies obtained from Dr. Arlyce Dice of both the EGD and colonoscopy from  June 2008 were negative for any abnormal findings.   Dictated by Renae Fickle for Dr. Landis Martins.      Chauncey Reading, D.O.  Electronically Signed     EA/MEDQ  D:  12/16/2006  T:  12/16/2006  Job:  [id number]

## 2010-09-30 NOTE — Discharge Summary (Signed)
NAME:  Wanda [name], Wanda [name]              ACCOUNT NO.:  [id number]   MEDICAL RECORD NO.:  [id number]          PATIENT TYPE:  INP   LOCATION:  9315                          FACILITY:  [facility]   PHYSICIAN:  Charles A. Clearance Coots, M.D.DATE OF BIRTH:  09/28/1955   DATE OF ADMISSION:  10/07/2007  DATE OF DISCHARGE:  10/12/2007                               DISCHARGE SUMMARY   ADMITTING DIAGNOSIS:  Menorrhagia.   DISCHARGE DIAGNOSES:  Menorrhagia, status post total abdominal  hysterectomy.   Discharged home in good condition.   REASON FOR ADMISSION:  A 55 year old G5, P3, black female referred by  Dr. Harriett Sine Phifer at [facility] Internal Medicine Department for heavy  and prolonged periods that have been progressively getting worse.  Periods have been difficult to manage in this patient who has had a  stroke and is wheelchair bound with contractures of her upper and lower  extremities.  After discussion of management with Dr. Harriett Sine Phifer, a  decision was made that the best management of this patient is a  hysterectomy to prevent the prolonged heavy bleeding and difficult  management of monthly periods.   PAST MEDICAL HISTORY/SURGERY:  Cranial surgery after a stroke in 1992.   ILLNESSES:  Lupus, asthma, and hypertension.   MEDICATIONS:  1. Metoprolol.  2. Keppra.  3. Plaquenil.  4. Effexor.  5. Prilosec.  6. Reglan.  7. Baby aspirin.  8. Norvasc.  9. Albuterol.  10.Lisinopril.  11.Hydrochlorothiazide.   ALLERGIES:  PENICILLIN.   SOCIAL HISTORY:  Widowed, homemaker.  Previously smoked one pack a day.  Negative alcohol or recreational drug use.   FAMILY HISTORY:  Positive for adult onset diabetes.   GYN HISTORY:  Normal Pap smears.  Last Pap smear is in 2007.   PHYSICAL EXAMINATION:  Well-nourished, well-developed black female with  no acute distress, wheelchair bound with some difficulty in speech.  Temperature 97.9, blood pressure was 107/74, and weight 201 pounds.  LUNGS:  Clear to  auscultation bilaterally.  HEART:  Regular rate and rhythm.  ABDOMEN:  Nontender.  PELVIC:  Uterus was normal size, shape, and contour, and nontender.  No  adnexal masses or tenderness appreciated.   IMPRESSION:  Menorrhagia, planned total abdominal hysterectomy.   ADMITTING LABS:  Hemoglobin 11, hematocrit 37, white blood cell count  4600, and platelets 501,000.  Comprehensive metabolic panel was within  normal limits.   HOSPITAL COURSE:  The patient underwent a total abdominal hysterectomy  on Oct 07, 2007.  There were no intraoperative complications.  The  postoperative course was uncomplicated.  The patient did have a slow  return to active bowel function and some difficulty with incentive  spirometry because of her previous cerebrovascular attack, but overall  did well postoperatively.  She was discharged home on postop day #5 in  good condition.   DISCHARGE LABS:  Hemoglobin 8.5, hematocrit 25, white blood cell count  10,500, and platelets 345,000.   DISCHARGE DISPOSITION/MEDICATIONS:  1. Tylox and ibuprofen was prescribed for pain.  2. Tereso Newcomer was prescribed for anemia.  3. The patient is to continue meds that were taken before coming  to      the hospital.   Routine written instructions were given for discharge after  hysterectomy.  The patient is to call office for a followup appointment  in 2 weeks.      Charles A. Clearance Coots, M.D.  Electronically Signed     CAH/MEDQ  D:  10/12/2007  T:  10/12/2007  Job:  [id number]   cc:   Alvester Morin, M.D.  Fax: [phone]

## 2010-09-30 NOTE — Op Note (Signed)
NAME:  Wanda [name], Wanda [name]              ACCOUNT NO.:  [id number]   MEDICAL RECORD NO.:  [id number]          PATIENT TYPE:  INP   LOCATION:  9374                          FACILITY:  [facility]   PHYSICIAN:  Charles A. Clearance Coots, M.D.DATE OF BIRTH:  [birth year]   DATE OF PROCEDURE:  10/07/2007  DATE OF DISCHARGE:                               OPERATIVE REPORT   PREOPERATIVE DIAGNOSIS:  Menorrhagia.   POSTOPERATIVE DIAGNOSIS:  Menorrhagia.   PROCEDURE:  Total abdominal hysterectomy.   SURGEON:  Charles A. Clearance Coots, MD   ASSISTANT:  Roseanna Rainbow, MD   ANESTHESIA:  General.   ESTIMATED BLOOD LOSS:  150 mL.   COMPLICATIONS:  None.   DRAINS:  Foley to gravity.   FINDINGS:  Normal uterus, ovaries, and fallopian tubes.   SPECIMEN:  Uterus with cervix.   DISPOSITION OF SPECIMEN:  Pathology.   OPERATION:  The patient was brought to the operating room and after  satisfactory general endotracheal anesthesia, the abdomen was prepped  and draped in the usual sterile fashion and an indwelling Foley was  inserted in the urinary bladder.  A Pfannenstiel skin incision was made  with a scalpel that was deepened down to the fascia with a scalpel.  Fascia was nicked in the midline and the fascial incision was extended  to left and to right with curved Mayo scissors.  The superior and  inferior fascial edges were taken off the rectus muscles with both blunt  and sharp dissection.  The rectus muscle was sharply divided in the  midline.  Peritoneum was entered digitally and was sharply incised  superiorly and inferiorly being careful to avoid the urinary bladder  inferiorly.  A O'Connor-O'Sullivan self-retaining retractor was then  placed in the incision.  The bowel was packed off and as the bowel was  being packed off, a small gauge tool was noted to be passing within the  abdomen and this was packed off anteriorly with the bowel.  The anterior  and posterior blades were then placed and the cornu of  the uterus were  grasped with Kelly forceps bilaterally.  The round ligaments were then  grasped with Kelly forceps and transected and suture ligated with  transfixion sutures of 0 Vicryl.  The vesicouterine fold of peritoneum  above the reflection of the urinary bladder was then grasped with  forceps and was incised with Metzenbaum scissors and the urinary bladder  was sharply dissected away from the lower uterine segment of the uterus  and then pushed down away from the lower uterine segment of the uterus  and cervix.  The broad ligament was then tented medially digitally and  parametrial clamp was placed across the utero-ovarian ligament,  fallopian tube and broad ligament and the pedicle was then transected  with curved Mayo scissors and a free tie of 0 Vicryl was placed beneath  the clamp and this was followed by a transfixion suture of 0 Vicryl.  The same procedure was performed on the opposite side without  complications.  The uterine vessels were then skeletonized and were  doubly clamped, transected, and suture ligated with transfixion  sutures  of 0 Vicryl bilaterally.  The cardinal ligaments were then clamped and  transected and suture ligated with transfixion sutures of 0 Vicryl down  to the uterosacral ligaments.  The uterosacral ligaments were clamped  bilaterally, transected, and suture ligated with transfixion sutures of  0 Vicryl and these were held with hemostats for identification and  closure of the vaginal cuff.  The vagina was then crossclamped meeting  in the center with parametrial clamps and the uterus along with the  cervix were then transected and submitted to pathology for evaluation.  The corners of the vaginal cuff were closed with transfixion sutures of  0 Vicryl and the center of the vaginal cuff was closed with interrupted  sutures of 0 Vicryl.  Hemostasis was excellent.  Pelvic cavity was then  thoroughly irrigated with warm saline solution and all pedicles  were  examined and were found to be hemostatic.  The self-retaining retractor  was then removed and all packing was removed.  Surgical technician  indicated that all sponge counts were correct.  The abdomen was then  closed as follows.  The parietal peritoneum was closed with a continuous  suture of 2-0 Vicryl.  The fascia was closed with a continuous suture of  0 Vicryl from each corner to the center.  The subcutaneous tissue was  thoroughly irrigated with warm saline solution and all areas of  subcutaneous bleeding were coagulated with the Bovie.  The skin was then  approximated with surgical stainless steel staples.  Sterile bandage was  applied to the incision closure.  The surgical technician indicated that  the sponge, needle, and instrument counts were correct x2.  The patient  tolerated the procedure well and was transported to the recovery room in  satisfactory condition.      Charles A. Clearance Coots, M.D.  Electronically Signed     CAH/MEDQ  D:  10/07/2007  T:  10/08/2007  Job:  [id number]

## 2010-10-03 NOTE — Discharge Summary (Signed)
NAME:  Wanda [name], Wanda [name]              ACCOUNT NO.:  [id number]   MEDICAL RECORD NO.:  [id number]          PATIENT TYPE:  INP   LOCATION:  6707                         FACILITY:  [facility]   PHYSICIAN:  Alvester Morin, M.D.  DATE OF BIRTH:  1955/09/03   DATE OF ADMISSION:  06/16/2005  DATE OF DISCHARGE:  06/22/2005                                 DISCHARGE SUMMARY   DISCHARGE DIAGNOSIS:  1.  Hyponatremia.  2.  Systemic lupus erythematosus.  3.  Hypertension.  4.  Anemia of chronic disease.  5.  History of ruptured berry aneurysm, cerebrovascular accident in 1991.  6.  Right hemiparesis with contracted right upper extremity and weakened      right lower extremity secondary to number 5.  7.  Expressive aphasia secondary to number 5.  8.  Gastroesophageal reflux disease.  9.  Seizure disorder secondary to number 5.  10. History of seizures secondary to hypernatremia in January 2007.  11. History of ventilator dependent respiratory failure in 2001 with      pulmonary embolus, pleural pericarditis, and low grade tamponade.  12. 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  13. History of tobacco use.  14. Right lower extremity pain.   DISCHARGE MEDICATIONS:  1.  Plaquenil 200 mg p.o. b.i.d.  2.  Keppra 500 mg p.o. b.i.d.  3.  Reglan 5 mg p.o. q.a.c. and h.s.  4.  Lopressor 50 mg p.o. b.i.d.  5.  Effexor 75 mg p.o. daily.  6.  Protonix 40 mg p.o. daily.  7.  Norvasc 5 mg p.o. daily.   CONDITION ON DISCHARGE:  Improved, stable, with sodium level within normal  limits, off IV fluids and fluid restriction for several days.  Follow up  appointment with Dr. Harvie Junior March 15 at 9 a.m.  Follow up BMP February 9 at  9 a.m. in the outpatient clinic.   PROCEDURES:  Right lower extremity films ankle, foot, tibia, and fibula,  showing osteopenia, no acute fracture, calcifications in the distal femur  and proximal tibia consistent with bony infarction.  Lower  extremity no  evidence of DVT or superficial thrombosis.   CONSULTATIONS:  Antonietta Breach, M.D., psychiatry, consulted for  recommendations for anti-depressant medications that may be less likely to  exacerbate hyponatremia.   BRIEF HISTORY AND PHYSICAL:  This is a 55 year old African American woman  with a history of  hyponatremia with admission on June 04, 2005, for  seizure secondary to hyponatremia, was found to have a sodium level of 120  at her hospital follow up visit.  The patient had emesis x 2 at clinic and  [facility] reported the patient had nausea and vomiting at home prior  to clinic visit.  She had been taking her medications as directed.  She  denied any changes in diet or excessive hydration.  She reported a headache  but denied any seizure activity.   MEDICATIONS AT HOME:  Depakote 500 mg p.o. b.i.d., Maxzide p.o. daily,  Plaquenil 200 p.o. b.i.d., Reglan 5 mg p.o. q.a.c. and h.s.,  Lopressor 50 mg  p.o. b.i.d., Protonix 40 mg p.o. daily.   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  He does have a  history of tobacco use.   FAMILY HISTORY:  Father died at 72 with CHF.   REVIEW OF SYSTEMS:  Positive for headache and right lower extremity pain.   PHYSICAL EXAMINATION:  On admission, temperature 98, blood pressure 125/80,  pulse 87, respirations 20, oxygen saturation 97% on room air.  In general,  she was in no acute distress.  Eyes with extraocular movements intact,  pupils equal, round, reactive to light.  ENT with no nasal discharge, no  oropharyngeal erythema.  No lymphadenopathy.  No thyromegaly.  Mucous  membranes slightly dry.  Lungs clear to auscultation bilaterally.  Heart:  Regular rate and rhythm, no murmurs.  Abdomen:  Soft, obese, nontender,  nondistended, positive bowel sounds.  Extremities:  Bilateral lower  extremity trace edema, right greater than left to  the mid calf.  Musculoskeletal:  Right upper extremity contracted, left upper extremity  full range of motion, strength 4/5, right lower extremity strength 3+/5,  left lower extremity full range of motion and 4/5 strength.  Neurological:  Cranial nerves 2-12 intact.  Sensory intact.  She does have an expressive  aphasia.   ADMISSION LABORATORY DATA:  Sodium 120, potassium 4.1, chloride 83, bicarb  27, BUN 3, creatinine 0.9, glucose 98, calcium 9.4.  White count 7.6,  hemoglobin 11.5.  Serum osmolality 254.   HOSPITAL COURSE:  Problem 1:  Hyponatremia.  The patient was started on normal saline and her  Maxzide was held.  Sodium levels were checked q.3h.  Glucose levels were  normal.  It was felt that the hyponatremia might be secondary to medication  side-effects.  The Depakote was changed to Keppra and Effexor was decreased  to 37.5.  Both of these medications can potentially cause SIADH.  The  patient was also placed on 1 liter fluid restriction per day, a salt  liberalized diet was encouraged.  Fluid restriction was eventually lifted  and IV fluids were discontinued as her sodium level normalized.  Her head,  nausea and vomiting, resolved with normalization of sodium levels.  Sodium  remained stable 2-3 days prior to admission off IV fluids and off fluid  restrictions.   Problem 2:  Systemic lupus erythematosus.  The patient was not considered to  be in active lupus flare.  She was kept on her Plaquenil 200 mg b.i.d.  throughout her stay.  Renal function was stable.   Problem 3:  Hypertension.  Blood pressures were elevated early on admission,  Lisinopril was increased to 60 mg p.o. b.i.d. and the Norvasc 5 mg daily was  added as the Maxzide was held.  The pressure was normalized by the time of  discharge.   Problem 4:  History of seizure disorder.  As noted earlier, Depakote was changed to Keppra to decrease the risk of hyponatremia.  No seizure activity  was noted during  admission.   Problem 5:  Urinary tract infection.  The patient was treated with a three  day course of Cipro 500 mg for urinary tract infection.   DISCHARGE LABORATORY DATA AND VITAL SIGNS:  Sodium 135, potassium 3.9,  chloride 102, bicarb 29, BUN 11, creatinine 0.7, glucose 80, calcium 9.1.  White cell count 5, hemoglobin 9.8, platelets 211.  Repeat urinalysis shows  clearing of the urinary tract infection.  Lipid panel with cholesterol 128,  triglycerides 44, HDL 46, LDL 73.  Temperature 97.8, blood pressure 131/82,  pulse 74, respirations 18, oxygen saturation 98% on room air.  Pending labs  are urine ADH.   The patient will follow up on Friday, February 8 at 9 a.m. for [facility] to check  her sodium level.  She has an appointment with Dr. Harvie Junior July 28, 2005.      Clent Demark, M.D.      Alvester Morin, M.D.  Electronically Signed    LG/MEDQ  D:  06/23/2005  T:  06/23/2005  Job:  [id number]

## 2010-10-03 NOTE — Discharge Summary (Signed)
[place]. [facility]  Patient:    Wanda [name], Wanda [name]                     MRN: [id number] Adm. Date:  [id number] Disc. Date: [id number] Attending:  Anise Salvo Dictator:   Jobe Marker                           Discharge Summary  DISCHARGE DIAGNOSES:  1. 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     b. Fevers.     c. Respiratory failure, December 15 to May 09, 2000.     d. Leukopenia.     e. Coagulopathy.     f. Right leg bone infarcts.     g. Prednisone therapy initiated May 04, 2000.  2. Multifactorial anemia likely secondary to connective tissue disease but     also complicated by upper GI bleed during last hospitalization on November     23 which was felt secondary to Mallory-Weiss tear and also bright red     blood per rectum on December 16 possibly secondary to internal hemorrhoids     seen on colonoscopy, May 21, 2000.  3. 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  4. Hypertension.  5. Tobacco abuse.  DISCHARGE MEDICATIONS:  1. Tegretol 200 mg p.o. t.i.d.  2. Hydrochlorothiazide 25 mg p.o. q d.  3. Protonix 40 mg p.o. q d.  4. Lopressor 75 mg p.o. b.i.d.  5. Fentanyl patch 50 mcg per hour q.72h.  6. Colace 100 mg p.o. b.i.d.  7. Albuterol MDI 2 puffs q.i.d.  8. Atrovent MDI 2 puffs q.i.d.  9. K-Dur 40 mEq p.o. q d. 10. Tylenol 650 mg p.o. q.4h. p.r.n. pain. 11. Prednisone taper outlined as follows:  60 mg p.o. q.d. x 2 more days, then     50 mg p.o. q.d. x 2 weeks, then 40 mg p.o. q.d. x 2 weeks, then 30 mg p.o.     q.d. x 2 weeks, then 20 mg p.o. q.d. x 2 weeks, then decrease dose by     2.5 mg every 2 weeks to  hopefully stable dose of 10 mg p.o. q.d. 12. Plaquenil 200 mg p.o. b.i.d.  CONSULTATIONS: 1. Cardiology:  [facility], Rollene Rotunda, M.D. and Dr. Gerri Spore were    consulted for pericardial effusion and low grade tamponade. 2. Orthopedic:  Dr. Luiz Blare was consulted for right leg bone infarcts. 3. Critical Care Medicine:  Dr. Shelle Iron and Dr. Delford Field were consulted. 4. CVTS:  Dr. Aliene Altes was consulted for VATS procedure. 5. Gastroenterology:  Dr. Loreta Ave was consulted for colonoscopy. 6. Rheumatology:  Dr. Phylliss Bob.  PROCEDURES: 1. Elective intubation December 15 secondary to large pericardial effusion and    increased work of breathing.  Extubated December 23. 2. Right heart cath December 17 which revealed a low pressure tamponade. 3. Left VATs procedure with pleural and pericardial biopsies and windows    performed.  Biopsies revealed severe fibrinous pleural pericarditis as    well as well as pulmonary thromboembolism. 4. Total body bone scan performed December 27 which was negative. 5. Bronchoscopy and mucus  plug removal performed May 17, 2000. 6. Colonoscopy performed by Dr. Loreta Ave, May 21, 2000, which revealed a small    nonbleeding internal hemorrhoids.  ADMISSION HISTORY AND PHYSICAL:   Wanda [name] is a 55 year old African-American female with a history of berry aneurysm and right hemiparesis in 1991 previously admitted in November for workup of abdominal pain, presented to the ACCU with poor p.o. intake, abdominal pain, and diarrhea.  Wanda [name] is aphasic but accompanied by home health aide who visits seven days per week about five hours per day and also provides bedtime assistance.  the patient has not eaten solid foods since her discharge in November, says is taking only milk and juice and complains of abdominal pain with no nausea or vomiting. She does have a few loose bowel movements per day but has not noticed any red or dark blood in this.  Patient also has been breathing  very shallow for the last several weeks.  There is no cough or chest pain, possibly with fevers and chills.  Her final complaint is of bilateral foot swelling for the last two weeks and foot pain.  Her previous workup during her November admission revealed an old nonbleeding Mallory-Weiss tear, negative chest CT, negative venous Dopplers, and small bilateral pleural effusions of unclear etiology. Her leg pain was thought secondary to gout and she was started on colchicine back in November.  PAST MEDICAL HISTORY:  Significant for her ruptured berry aneurysm with resultant right hemiparesis and aphagia, seizure disorder secondary to that. Hypertension, iron deficiency anemia, and chronic UTIs.  ADMISSION MEDICATIONS:  1. MiraLax 1 teaspoon b.i.d.  2. Colchicine 0.6 q.d.  3. FeSO4 325 mg p.o. q.d.  4. Procardia XL 90 mg p.o. q.d.  5. Vasotec 5 mg p.o. q.d.  6. Tegretol 200 mg q.a.m., 400 mg at noon, and 400 mg p.o. q.h.s.  7. Hydrochlorothiazide 25 mg p.o. q.d.  8. Hydroxyzine 25 mg p.o. q.6h. p.r.n. itch.  9. Tylenol No. 3 p.r.n. 10. Macrobid 50 mg p.o. q.d. 11. Elavil 25 mg p.o. q.h.s.  FAMILY HISTORY:  Positive for congestive heart failure in a father who died at age 24 and also had diabetes.  Mother is positive for hypertension.  SOCIAL HISTORY:  Patient lives with her husband and son.  Has a nursing aid seven days a week.  She is a tobacco user but always less than one pack per day. Denies alcohol or drug use.  REVIEW OF SYSTEMS:  Positive for slight subjective fevers and chills. Negative for headache, sore throat, congestion or runny nose.  Negative for chest pain, palpitations.  Positive for fast shallow breathing for the last two weeks as well as diarrhea two times per day per HPI.  Otherwise, noncontributory.  PHYSICAL EXAMINATION:  VITAL SIGNS:  Temperature 99.6, heart rate 150, blood pressure 104/74, respirations 40, 94% saturation on room air.  GENERAL:  She  appears much older than her stated age.  Shallow breathing and in moderate distress.   HEENT:  Mucous membranes are very dry, cracked.  Oropharynx with thick secretions but no lymphadenopathy or thyromegaly.  CARDIOVASCULAR:  Tachycardic but regular without murmurs or rub.  There is no JVD.  She does have thready pulses x 4.  Respirations are clear to auscultation bilaterally but she is not moving much air with very shallow breaths.  GI:  Positive bowel sounds, firm abdomen but voluntarily guarding.  She is diffusely tender throughout without rebound.  No HSM, no mass, no surgical scars.  EXTREMITIES:  Reveal right calf and lower leg tenderness and symmetric 2+ pedal edema bilaterally.  NEUROLOGIC:  She is awake and grossly oriented but expresses aphagia and she has a stable right upper extremity contracture.  RECTAL:  Reveals green stool in the vault that is heme negative.  ACCESSORY DATA:  Admission EKG reveals sinus tachycardia, 150 beats per minute, with diffuse ST elevation.  LABORATORY DATA:  All labs are pending at the time of admission.  HOSPITAL COURSE: #1 - CONNECTIVE TISSUE DISEASE, LIKELY SYSTEMIC LUPUS ERYTHEMATOUS:  Patient was admitted on December 13 with multiple nonspecific complaints and incidentally noted to have large pericardial effusion by chest x-ray and was confirmed by her diffuse ST elevation on EKG.  A 2-D echo was performed and also showed a large pericardial effusion which prompted a right heart cath performed on December 17.  This cath revealed low grade tamponade physiology and a VATS procedure was performed on December 18. During this VAT procedure, they performed pleural and pericardial biopsies as well as windows.  Biopsies revealed fibrinous pleural pericarditis which was severe and also biopsy of the left lower lobe revealed thromboembolism.  Patient developed increase work of breathing prior to this procedure and was electively intubated.   She remained on the ventilator for eight days before she was safely extubated. Cultures of all these pericardial and pleural tissues are negative for AFD, gram stains were negative and all that was revealed was severe fibrinous pleural pericarditis.  During her stay, Ms. Quinby developed severe coagulopathy and bright red blood per rectum.  This was while she was on the ventilator.  Dr. Loreta Ave attempted a flexible sigmoidoscopy at the bedside but patient was too unstable.  This was put off until much later in the hospitalization and her coagulopathy actually resolved with supportive care and the bright red blood per rectum also resolved along with the coagulopathy. On December 30, Ms. Stoneman developed bronchial mucus plugging that required intensive chest PT, mucinous nebs and eventually bronchoscopy on December 31. This greatly resolved her respiratory symptoms and she was able to be tapered down to albuterol and Atrovent MDIs prior to discharge.  Dr. Phylliss Bob was consulted regarding this patients connective tissue disease and feels this is most likely systemic lupus erythematous; however, only positive lab is an ANA which was positive at 1 to 640 in a homogeneous pattern.  However, many of her complications certainly go along with the diagnosis of lupus and prednisone taper was started as described above.  #2 - HYPERCOAGULABILITY:  As mentioned above, her lung biopsy revealed thromboembolism and she also was found to have right bony infarcts on plain film.  These were felt to likely be thrombotic in nature and Dr. Phylliss Bob was consulted regarding this as well.  We checked her antiphospholipid antibody which was negative as well as her lupus anticoagulant which was negative.  We debated anticoagulation and we are still considering this; however, we feel with the patients history of nonadherence to followup as well as absence for a clear indication for anticoagulation, we are going to hold off on  this and present this patient at a Friday case conference.  #3 SEIZURE DISORDER:  Ms. Chavis did have some seizures which were characterized by facial twitching and confusion while in house.  Her Tegretol was stopped briefly wondering if this may be contributing to a drug induced leukocyte syndrome; however, her leukocyte symptoms never did improve and she did have those seizures so she was placed back on Tegretol and brought to  a therapeutic level and she had no further seizures in house.  #4  - HYPERTENSION:  Her home meds were adjusted and blood pressure control was greatly improved while in house and she was normotensive upon discharge.  #5 - BONE PAIN:  This pain is felt likely secondary to her infarcts.  She had a bone scan performed on December 27 which was negative.  Orthopedics considered getting an MRI to rule out a radiculopathy, however, she has a metal plate in her head and this was not an option.  CT myelogram would have been her only other option but this seemed like a drastic procedure when we had a good reason for her bone pain. Her pain was much improved on pain medicine as well as her prednisone taper and we will just continue those for that.  #6  - ABDOMINAL PAIN AND BRIGHT RED BLOOD PER RECTUM:  Once her acute problems as described above had resolved, Dr. Loreta Ave performed a colonoscopy on January 4.  This revealed only some small nonbleeding internal hemorrhoids. Otherwise, it was negative.  Interesting to note though, the patients symptoms of abdominal pain greatly resolved after her bowel clean out, so we consequently put Ms. Howatt on a bowel regimen which includes Colace thinking that her constipation may be sole cause of her abdominal pain.  DISPOSITION:  Ms. Blasco was discharged back home with her home health and physical therapy through the [facility] program.  FOLLOW-UP:  She will follow up with me at [facility] clinic in one week.  She will follow up with CVTS on  January 22.  We plan to discuss her possible future anticoagulation at our Friday case conference with rheumatology professor at [facility].  DISCHARGE LABORATORIES:  Normal chemistry 7 with a creatinine of 0.4.  Her last CBC was a white count of 6.2, hemoglobin 11.2, and platelets 381,000.  As  far as her lupus labs go, she had a positive ANA at 1 to 640, she had a positive extractable nucleic acid screen, positive C reactive protein at 4.7, but all other labs were negative including a negative Smith antibody, a negative anti-Ro, negative anti-La, negative ribonucleic protein, negative scleroderma antibody, and negative sed rate at 8.  Her total complement: CH50 was 99, her C4 was 23, her C3 was 99, which were both normal.  She had a negative direct antiglobulin IgG complement.  She had a negative ANCA.  She had a negative lupus anticoagulant.  She had a negative antiphosopholipid antibody.  She had a negative anti-double-stranded DNA, and a negative ASO titer.  Her histone antibody was inconclusive. DD:  05/26/00 TD:  05/27/00 Job: [id number]

## 2010-10-03 NOTE — Discharge Summary (Signed)
[place]. [facility]  Patient:    Wanda [name], Wanda [name]                     MRN: [id number] Adm. Date:  [id number] Disc. Date: 04/15/00 Attending:  Farley Ly Dictator:   Tawni Millers CC:         Alvester Morin, M.D., outpatient clinic  Florencia Reasons, M.D., [facility]   Discharge Summary  DATE OF BIRTH:  [birth year].  DISCHARGE DIAGNOSES:  1. Abdominal pain secondary to obstipation, resolved upon discharge.     Presumably this is secondary to her medications.  2. Right lower extremity pain, work-up negative.  Presumed diagnosis of gout.  3. Leukopenia with white blood cell count between 1 and 3.  4. Thrombocytosis.  Platelets consistently over 500.  5. Pleural effusions, unclear etiology.  6. Hypoxemia this admission, ruled out pulmonary embolus and deep venous     thrombosis.  7. Guaiac positive stools with presumed upper gastrointestinal bleed,     negative esophagogastroduodenoscopy this admission.  8. Mild iron-deficiency anemia.  9. Hypertension. 10. 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 11. Seizure disorder. 12. Tobacco use.  DISCHARGE MEDICATIONS: 1. MiraLax one teaspoon in eight ounces of water p.o. b.i.d. 2. Colchicine 0.6 mg p.o. b.i.d. 3. Iron sulfate 325 mg p.o. t.i.d. (note increase from p.o. q.d. which is    what she came in on).  Resume previous medications which include: 1. Procardia XL 90 mg p.o. q.d. 2. Vasotec 5 mg p.o. q.d. 3. Carbamazepine 200 mg two p.o. b.i.d. and one tablet p.o. q.a.m. 4. Hydrochlorothiazide 25 mg p.o. q.d. 5. Hydroxyzine 25 mg p.o. q.6h. p.r.n. 6. Nitrofurantoin 50 mg p.o. q.d. 7. Elavil 25 mg p.o. q.h.s. 8. Vicodin p.r.n.  CONSULTATIONS:  Florencia Reasons, M.D.  PROCEDURES THIS ADMISSION:  This includes an EGD per Dr. Matthias Hughs which yielded no active bleeding or blood in the stomach, the possible resolving Mallory-Weiss tear which could have accounted for  guaiac positive stool. Otherwise very mild esophagitis, and biopsies revealed normal mucosa.  She did have a 3 cm hiatal hernia and a widely patent esophageal ring.  No obvious source of the guaiac positivity.  Venous Dopplers for leg pain. These were negative for DVT.  BRIEF HISTORY AND PHYSICAL:  The patient is a 55 year old African-American female who presented complaining of right upper quadrant and right lower quadrant abdominal pain and back pain, along with some shortness of breath and chest pain.  She did admit to constipation as well as dark tarry stools but no bright red blood per rectum.  PHYSICAL EXAMINATION:  GENERAL APPEARANCE:  Her physical examination was significant for right-sided hemiparesis which was old.  She had an inability to speak clearly except to answer questions with monosyllabic words.  HEENT:  Oropharynx clear.  NECK:  Supple with no lymphadenopathy.  No JVD.  LUNGS:  Clear to auscultation with mildly decreased breath sounds at the bases secondary to her immobility.  CARDIOVASCULAR:  She had tachycardia initially, resolved with hydration.  ABDOMEN:  Slightly distended, mildly firm and mild tenderness in the right upper quadrant and right lower quadrant.  There was no rebound or guarding.  EXTREMITIES:  There was very mild edema.  On the right she was exquisitely tender on the dorsum of the foot, and along the ankle.  RECTAL:  There were guaiac positive stools.  NEUROLOGIC:  There was the old hemiparesis as mentioned.  Otherwise the patient seemed  alert and oriented.  INITIAL LABS:  White count of 3.4, hemoglobin 10.3, platelets 598 with MCV of 74, lymphocyte count of 0.3.  Her BUN and creatinine is 12 and 0.5 and sugar of 113.  Liver function tests were normal except for albumin of 2.8.  PT and INR were likewise normal.  Urinalysis was negative.  EKG showed sinus tachycardia but no ischemic changes.  Her acute abdominal series showed  possible left lower lobe infiltrate versus atelectasis with bilateral pleural effusions, mild cardiomegaly and significant amount of stool throughout the colon.  HOSPITAL COURSE:  #1 - PRESUMED UPPER GI BLEED DUE TO GUAIAC POSITIVE STOOLS:  The patients CBCs were monitored and they remained relatively stable. Dr. Matthias Hughs was consulted, and EGD showed the findings as above.  The patient had been started presumably on Protonix 40 p.o. q.d. which was continued throughout the hospital stay.  The patient showed no further signs of bleeding this admission.  The plan is to send her out, and have her follow up in the clinic at which time she should receive guaiac cards. She is to follow up with Dr. Matthias Hughs if these remain guaiac positive for possible barium enema or colonoscopy.  #2 - CHEST PAIN WITH HYPOXEMIA:  She had CKs x 3 and she ruled out.  Her ABG showed pH of 7.44 and pCO2 of 40 and pO2 of 50 which gave her a worsened gradient. The patient was therefore worked up for PE with venous Dopplers and chest CT which were negative.  By the second day of admission it became quite clear that the patients pain was GI in nature, and follow-up work-up was not pursued.  #3 - PLEURAL EFFUSIONS:  Effusions of unknown etiology were worked up with a cardiac echo which showed a hyperdynamic left ventricle and 70% ejection fraction, ruling out congestive heart failure as cause of her effusions.  Her x-rays were repeated the day prior to discharge, which showed only very small effusions which were free-flowing, as well as continuing bilateral atelectasis which is likely result of the patients inactivity secondary to her old stroke.  As the patient showed no signs of infection or shortness of breath at this point, no further work-up was indicated.  If pleural effusions return or continue, the patient should receive diagnostic thoracentesis in the future.   #4 - MILD LEUKOPENIA:  The patient had a white  count of 3.4 on admission, this fell during admission to 1.9 at one point.  Most of the time it was in the high 2s to low 3s.  This may be due to the patients carbamazepine, or viral infections, or it may just be normal for her.  We did get an HIV test which was negative.  No further work-up indicated unless this drops precipitously.  #4 - THROMBOCYTOSIS:  This is likely secondary to her mild iron-deficiency. No further work-up pursued.  #5 - MILD IRON-DEFICIENCY ANEMIA:  Will continue the patient on iron, and have the patient follow up in the clinic to check her hemoglobin.  Her iron studies revealed iron 69, TIBC 4 or 5 and percent saturation is 17.  She should be tried off of iron as an outpatient, and receive fecal occult blood tests to avoid false positives.  #6 - CONSTIPATION:  Please see #1.  Treated aggressively in house with multiple enemas, and now on a program of MiraLax.  Follow up with Dr. Matthias Hughs if this continues to be a problem.  The patient should receive evaluation from  home health nursing to insure that she is not becoming obstipated or impacted.  #7 - RIGHT LOWER EXTREMITY PAIN:  As there is no redness or swelling, or abnormality on the x-ray, it was presumed to be a mild gout exacerbation. The patient was started on colchicine two days prior to discharge and she seemed to have some relief. This should be followed up in the outpatient clinic, and if it continues to be a problem, a short course of steroids is warranted. Also should insure that the patient has indeed been diagnosed with gout with joint aspiration.  #8 - MILD HYPOKALEMIA:  On April 12, 2000, the patient was noted to have a potassium of 2.4. This was aggressively replaced, and is not an issue anymore. This may have contributed to her constipation.  OTHER STUDIES:  Other studies this admission included an ultrasound of the abdomen to rule out gallstone disease.  This showed only some  gallbladder sludging.  No stones.  She also had a transvaginal ultrasound to rule out ovarian causes of her pain. This showed only very small amount of free pelvic fluid which was physiologic, as well as no marked abnormalities in the endometrium.  The right ovary did contain a few tiny, less than 1 cm, follicles which was not thought to be contributory.  Other studies included right foot x-ray which showed no abnormalities, except marked osteopenia.  CT of the chest and abdomen and pelvis revealed only the effusions as described previously. There was no PE or parenchymal lesions.  CT of the abdomen just showed marked feces.  There were no other abnormalities.  DISCHARGE PLANNING:  The patient may benefit from outpatient nursing care. Will attempt to set this up as an outpatient if deemed necessary.  As of now, the patient will be sent home to her husband.  FOLLOW-UP:  The patient will follow up in the outpatient clinic on Wednesday, April 21, 2000, at 2 p.m. DD:  04/15/00 TD:  04/15/00 Job: [id number]

## 2010-10-03 NOTE — Procedures (Signed)
[place]. [facility]  Patient:    Wanda [name], Wanda [name]                     MRN: [id number] Adm. Date:  [id number] Disc. Date: [id number] Attending:  Arsenio Loader CC:         Dewayne Shorter, M.D.   Procedure Report  DATE OF BIRTH:  01-05-1956.  PROCEDURE:  Colonoscopy.  ENDOSCOPIST:  Anselmo Rod, M.D.  INSTRUMENT USED:  Olympus video colonoscope.  INDICATION FOR PROCEDURE:  A history of anemia with guaiac-positive stools in the recent past and ongoing abdominal pain, especially in the left lower quadrant, in a 55 year old African-American female with a history of stroke and pleuropericarditis, doing well on prednisone, off the ventilator now. Rule out colonic polyps, masses, hemorrhoids, etc.  PREPROCEDURE PREPARATION:  Informed consent was procured from the patient. The patient was fasted for eight hours prior to the procedure and prepped with a bottle of magnesium citrate and a gallon of NuLytely the night prior to the procedure.  PREPROCEDURE PHYSICAL:  VITAL SIGNS:  The patient had stable vital signs.  NECK:  Supple.  CHEST:  Decreased breath sounds at the bases.  S1, S2 regular.  ABDOMEN:  Soft with normal abdominal bowel sounds.  Left lower quadrant tenderness on palpation.  DESCRIPTION OF PROCEDURE:  The patient was placed in the left lateral decubitus position and sedated with 50 mg of Demerol and 6 mg of Versed intravenously.  Once the patient was adequately sedate and maintained on low-flow oxygen and continuous cardiac monitoring, the Olympus video colonoscope was advanced from the rectum to the cecum.  No masses, polyps, erosions, ulceration, or diverticula were seen.  The patient had small, nonbleeding internal hemorrhoids seen on retroflexion.  There was some residual stool in the transverse colon, but visualization was adequate.  IMPRESSION: 1. Normal colonoscopy up to the cecum except for some residual stool in the  colon. 2. Small, nonbleeding internal hemorrhoids.  RECOMMENDATIONS:  The patients abdominal pain seems to have resolved today after the NuLytely prep.  I suspect that constipation may be causing some of her abdominal discomfort, and therefore a high-fiber diet with liberal fluid intake has been advocated along with stool softeners as needed.  If her symptoms are not improved, a further workup will be undertaken. DD:  05/21/00 TD:  05/21/00 Job: [id number]

## 2010-10-03 NOTE — Op Note (Signed)
[place]. [facility]  Patient:    Wanda [name], Wanda [name]                     MRN: [id number] Adm. Date:  [id number] Attending:  Arsenio Loader CC:         Barbaraann Share, M.D. [facility]   Operative Report  PREOPERATIVE DIAGNOSES:  Pleural pericarditis with respiratory failure.  POSTOPERATIVE DIAGNOSES:  Severe pleural pericarditis, questionable early empyema.  OPERATION PERFORMED:  Left video-assisted thoracoscopy, left thoracotomy, pleural biopsy, decortication left lung, pericardiectomy.  SURGEON:  D. Karle Plumber, M.D.  FIRST ASSISTANT:  Myrlene Broker, P.A., [address] [place].  ANESTHESIA:  General.  After this patient had went on a respirator with respiratory failure, had a loculated pleural effusions bilaterally and increasing pericardial effusion. Was brought to the operating room and a single lumen tube was changed to a dual lumen tube.  Patient had had a previous right stroke and had contracture of her right arm.  She was turned to the left lateral thoracotomy position. Was prepped and draped in the usual sterile manner.  Two Trocar sites were made in the seventh intercostal space at the anterior and axillary line.  Two Trocars were inserted.  There was a tremendous ______ reaction and the lung was stuck to the chest wall with a lot of exudate and pleuritis and the lung was gradually freed up using Kaiser ring forceps to free the lung off the wall.  Cultures were taken of the pleural fluid.  Pleural biopsies were done. Cultures were taken of the pleural exudate.  ______ the cardiac sac was markedly inflamed and they did not appear to be distended with a lot of pericardial fluid as had been shown on CT scan.  It was decided to do a pericardial window open so the medial Trocar site was extended and dissection was carried down to the sixth intercostal space which was open and a ______ was placed in the intercostal space.  The pericardium was markedly  inflamed as thought and on the anterior rim of the pericardium the pericardium was open. It was thickened at about 3-4 mm thickened.  There was inflammatory exudate between the pericardium and the heart.  This was taken for culture as well as pericardial fluid for culture.  After the pericardium was open a large pericardiectomy was done from the left phrenic nerve medially over to the right atrium.  This was done by using the SCB 45 Ethicon stapler with multiple applications to free up the left ventricle and most of the right ventricle up to the outflow tract.  After this had been done it was sent for frozen section.  Revealed to be an inflammatory process.  No malignancy was seen. One chest tube was placed through the Trocar site and tied in place with 0 silk as a straight 28.  Another straight 28 and a right angled 28 were placed in the pericardium.  The chest was closed.  There was three pericostals, #1 Vicryl in the muscle layer, Ethicon skin clips.  The patient was returned to the intensive care unit in critical condition. DD:  05/04/00 TD:  05/04/00 Job: [id number]

## 2010-10-03 NOTE — Consult Note (Signed)
[place]. [facility]  Patient:    Wanda [name], Wanda [name]                     MRN: [id number] Proc. Date: 04/09/00 Adm. Date:  [id number] Attending:  Farley Ly CC:         Alvester Morin, M.D.   Consultation Report  Medicine teaching service asked me to see this 55 year old female because of apparent GI bleeding and abdominal pain.  HISTORY:  Wanda [name] was admitted to the hospital earlier today with epigastric pain and dark tarry stools, apparently of about five days duration.  The history is nonspecific and somewhat difficult to obtain since the patient has a history of aphagia as a result of an aneurysm when she was a young girl.  The pain was apparently in the right upper quadrant with radiation into the back.  There has been use of Motrin on a daily basis for joint pain but there is no prior history of GI bleeding nor any dyspeptic symptomatology.  The patients hemoglobin is 8.6 with an MCV of 74 and RDW of 15.  ALLERGIES:  PENICILLIN.  PAST SURGICAL HISTORY:  None.  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  MEDICATIONS:  Procardia, ferrous sulfate, Vasotec, nitrofurantoin, Elavil, hydrochlorothiazide, Tegretol, p.r.n. Darvocet, Hydroxyzine.  HABITS:  Smokes minimally.  Nondrinker.  FAMILY HISTORY:  Negative for gallbladder disease, colon cancer, or ulcers.  SOCIAL HISTORY:  Patient is disabled and lives at home with her husband and sons.  REVIEW OF SYSTEMS:  Negative for dyspeptic symptoms, nausea, vomiting, fever, chills, constipation, diarrhea, or anorexia.  PHYSICAL EXAMINATION:  VITAL SIGNS:  Normal.  Anicteric.  No gross pallor.  CHEST:  Clear.  HEART:  Without gallops, rubs, murmurs, clicks, or arrhythmias.  ABDOMEN:  Without guarding, mass, or tenderness.  RECTAL:  Heme negative stool ______ at this  time.  LABORATORIES:  White count low at 1900, hemoglobin 8.6, MCV 74, RDW 15.  BUN 10, creatinine 0.4.  Liver chemistries:  Normal.  Note that there has been a drop in hemoglobin from 10.6 yesterday, presumably when checked in the emergency room.  Platelets 491,000, differential count 83 polies, 9 lymphs.  IMPRESSION: 1. Recent dark stools (on iron), currently Hemoccult negative.  Questionable    gastrointestinal bleeding (baseline hemoglobin not known). 2. Drop in hemoglobin since arriving at hospital, presumably secondary to    hydration. 3. Microcytic anemia suggesting possible iron deficiency. 4. Nonspecific right upper quadrant pain. 5. Aphagia and hemiparesis secondary to previous cerebrovascular accident    related to brain aneurysm.  PLAN: 1. Proceed with endoscopic evaluation because of the history of abdominal    pain, dark stools, and questionable recent gastrointestinal bleeding. 2. Consider abdominal ultrasound to rule out gallstones, possibly with a CT    scan of the abdomen for further delineation of the source of her abdominal    pain.  I have discussed the nature, purpose, and risks of endoscopy with the patient and she is agreeable to proceed. DD:  04/09/00 TD:  04/09/00 Job: [id number]

## 2010-10-03 NOTE — Discharge Summary (Signed)
NAME:  Wanda [name], Wanda [name]              ACCOUNT NO.:  [id number]   MEDICAL RECORD NO.:  [id number]          PATIENT TYPE:  INP   LOCATION:  6707                         FACILITY:  [facility]   PHYSICIAN:  Clearance Coots, M.D.    DATE OF BIRTH:  [birth year]   DATE OF ADMISSION:  06/06/2005  DATE OF DISCHARGE:  06/09/2005                                 DISCHARGE SUMMARY   DISCHARGE DIAGNOSES:  1.  Seizure secondary to severe hyponatremia (January 18, resolved).  2.  Hyponatremia, hypochloremia, hypokalemia secondary to nausea and      vomiting, resolved.  3.  Anemia of chronic disease.  4.  Systemic lupus erythematosus.  5.  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  6.  Borderline tachycardia.  7.  Seizure disorder.  8.  Gastroesophageal reflux disease.  9.  Hypertension.  10. History of tobacco abuse.  11. History of ventilator dependent respiratory failure in 2001 with      disassociated pleural pericarditis with low grade tamponade and      pulmonary embolism.  12. History of right lower extremity bone infarct.  13. 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   DISCHARGE MEDICATIONS:  1.  Depakote 500 mg b.i.d.  2.  Plaquenil 200 mg b.i.d.  3.  Reglan 5 mg b.i.d.  4.  Lopressor 20 mg daily (normal home dose is 100 mg daily, however the      patient was not on Lopressor for most of the hospitalization and then it      was started back gradually and was discharged on 20 mg. It should be      titrated up per Dr. Harvie Junior).  5.  Maxzide 37.5/25 mg daily.  6.  Effexor 75 mg daily.  7.  Protonix 40 mg daily.   PROCEDURE:  CT of the head without contrast medium on June 06, 2005 shows  stable exam. There is postsurgical change on the left with encephalomalacia  and ventriculostomy shunt catheter in place. Generally features are stable  and there is no acute abnormality on the current study although streak  artifact omission due to grade image quality. Chest x-ray the same day  showed low volume exam with an elevated left hemidiaphragm and left lower  lobe atelectasis. A post PICC chest x-ray showed left-sided PICC line  terminates at the cavoatrial junction and elevated left hemidiaphragm  without definite change. On June 06, 2005 a PICC line was placed on the  left secondary to poor IV access.   CONSULTATIONS:  None.   HISTORY AND PHYSICAL:  For a full H and P please consult the chart, but in  brief Wanda [name] is a 55 year old African American woman with a past medical  history significant for seizure disorder secondary to ruptured berry  aneurysm in 1991 that also left her hemiparetic on the right with expressive  aphagia. She presented with a seizure on the morning on admission. Her older  son found her around 8 a.m. breathing heavily but not demonstrating any gaze  preference nor jerking or  incontinence. Her last seizure was 4 months ago  and was similar to the one previously described. She takes her medicines  daily as prescribed. She has had a cold for a week and a half and had been  vomiting 2 to 3 times a day for 3 days and had been on a clear liquid diet  per Dr. Harvie Junior which was telephone directions. She also complained of leg  pain on and off. She has had no fevers and has an in home nurse.   She is allergic to penicillin.   Her vital signs are pulse 86, blood pressure 150/83, later checked at  131/77. Temperature 98.1, respirations 22, she was sating 98% in room air.  In general she was alert, aphasic, followed commands somewhat although not  always reliably. Pupils are equal, round, and reactive to light. Extraocular  muscles are intact. No icterus. Oropharynx is clear. Poor dentition. Mucous  membranes were moist. Neck was supple. No lymphadenopathy. Lungs are clear  to auscultation bilaterally except at the right base where there was  decreased air movement.  Cardiovascularly she had a regular rate and rhythm.  No murmurs, rubs, or gallops. Abdomen positive bowel sounds, nontender,  nondistended. Extremities showed scant trace edema. No calf tenderness. Skin  was dry and warm. She had no cervical lymphadenopathy. Musculoskeletally she  had right spastic hemiparesis with right upper extremity contracture and  inability to move her right leg. Her sodium was 118, that was on an i-STAT  measurement. It was later measured by normal labs and it was 120. Potassium  on the normal labs is 2.7. Chloride 85. Bicarb 27. Glucose 162. BUN 3.  Creatinine 0.7. Total bilirubin 0.7. Alk phos 42. AST 21. ALT 10. Total  protein 6.6. Albumin 3. Calcium 9.0. Cardiac enzymes showed a CK of 145, CK-  MB 3.2, relative index 2.2, troponin of 0.02 therefore is negative. PT at  the time was 14.8, INR 1.1, PTT was 23. Alcohol level was less than 5.  Urinalysis was negative except for ketones at 15. Urine drug screen was  negative. Spot urine sodium was 71. Spot urine creatinine was 30.5 for a  __________ of slightly greater than 1.5%. Urine osmolality was noted on the  next day and that was 221, which is slightly low. At the time of admission  blood cultures x2 were ordered; however, only 1 was performed and this was  drawn through the PICC line and it came up positive for Staph species that  were coagulase negative. Her Depakote level was therapeutic at 87.2. The ED  also measured her Tegretol and Dilantin levels, neither of which she takes  and neither of which were therapeutic.   HOSPITAL COURSE:  1.  For her seizure on the morning of admission in a patient with seizure      disorder and therapeutic Depakote and hyponatremia it was felt that the      seizure was secondary to the severe hyponatremia although infectious      causes were investigated. She did appear nontoxic. Did not have an     elevated white blood cell count. Did not have a fever. Her head CT was       negative. Her chest x-ray was also negative. Therefore the chances of an      infectious etiology were lower. Her Depakote was continued. Her      hyponatremia was corrected with p.o. clear liquids and normal saline at      200 mL  an hour for 1 liter and then switched to 125 mL an hour. The      normal saline was adjusted slightly between 125 mL to 200 mL an hour      throughout her hospitalization and was successful in slowly increasing      her sodium. On the day of discharge her sodium at midday was 132 and she      was encouraged to liberalize salt in her diet and had a very salty lunch      and normal saline was continued at 200 mL an hour. She was able to      maintain her sodium level even without normal saline and she did so      overnight on the night prior to discharge but her sodium level had not      come up to absolute normal so therapy was continued. She was discharged      once her sodium was 134 or better. She will be following up with Dr.      Harvie Junior on the 30th of January and will have a stat BMET at that time to      evaluate for electrolyte disturbances.  2.  For her hyponatremia/hypochloremia/hypokalemia these were all thought to      be secondary to her nausea and vomiting. She did not have any vomiting      or nausea during her hospitalization so it was felt that this was      probably secondary to some acute viral illness. We corrected the      hyponatremia as previously described. The hypochloremia corrected on its      own with treatment of hyponatremia since she was given sodium chloride      intravenously. Her potassium was corrected with oral potassium as      needed. On discharge her potassium had come back to normal.  3.  For her lupus we continued her Plaquenil. It was not felt that this was      a lupus flare; however, on prompting from 1 of the attendings we checked      ANA level which actually was negative. I have read that there are some      types of  lupus that appear to be ANA negative because of the type of      test using mouse tissue rather than human. This may be the case but      because this was not felt to be a lupus issue I did not further      investigate this. Further studies can be pursued as an outpatient if      warranted.  4.  Hypertension. On admission she was only slightly hypertensive or really      normal and we held her antihypertensives while we volume repleted her.      Her blood pressure remained at that level throughout hospitalization.      Actually on discharge was a little lower than on admission. She was      restarted on Lopressor at a low dose on the day prior to discharge and      that was at 12.5 in order to bring her borderline tachycardic heart rate      under control. She tolerated that well and that was increased to 25 on      the day of discharge. She was discharged with Lopressor 25 mg because of     the fact that her blood pressure  had been relatively low during      hospitalization. It will be up to Dr. Harvie Junior to titrate the medication      back up to her home dose of 100 mg p.o. daily. Similarly, her Maxzide      was held for the course of her admission. It will be restarted as an      outpatient.  5.  For her upper respiratory infection associated with nausea and vomiting      but no fever, no white blood cell count, and a negative chest x-ray it      was felt to be likely viral and she was placed on the clear liquids diet      and tolerated that well and was advanced without problems. She was given      Phenergan p.r.n. order but did not require it.  6.  For her hemiparesis secondary to ruptured aneurysm and aphasia we      continued Reglan and I pursued the option of getting longer home health      time with her because she reported only having a home health CNA for 3      hours a day. However, when we spoke to her son it became evident that      she actually has a CNA for 5 hours a day  during the week and 3 hours a      day on Saturdays and that the rest of the time 1 or both of the sons who      live with her care for her.  7.  For her anemia this was worked up to rule out any other kind of cause.      She was fecal occult blood negative, had a normal ferritin and a normal      reticulocyte count and her hemoglobin remained stable throughout      hospitalization so it was felt that this reflected anemia of chronic      disease. One option as an outpatient is to check her erythropoietin      level but the efficacy of erythropoietin injections in these patients is      arguable. Therefore with her stability of her hemoglobin of around 10 to      11 I feel that it is not necessary to pursue any further treatment of      this.  8.  For her gastroesophageal reflux disease we continued her Protonix.   On the day of discharge Wanda [name] was doing well, tolerating a full diet  and her hyponatremia was resolving. Temperature 97.6, blood pressure 114 to  128/73 to 76, pulse 92 to 100, respirations 18 to 24, oxygen saturation 96  to 99% in room air. In general she is alert and oriented, in no acute  distress. Heart rate regular with a regular rhythm. No murmurs, rubs, or  gallops. Lungs clear to auscultation bilaterally. Abdomen nontender,  nondistended. Positive bowel sounds. Her right upper extremity was  contracted and neurologically there were no gross changes. Hemoglobin 9.9,  hematocrit 29, white count 6.4, platelets 178. Her sodium was 132, potassium  3.7, chloride 101, bicarb 26, BUN 2, creatinine 0.6, glucose 96. Her calcium  was 8.9. Reticulocyte count was 1.7%. Ferritin 24, range is 10 to 291. ANA  was negative.   As I mentioned she will be following up with Dr. Harvie Junior, her primary care  physician, on the 30th in the morning and will have a stat BMET  to assess  for her electrolytes and also a stat CBC to make sure that her hemoglobin is indeed stable, which I expect  it will be. I have spoke with her family and  instructed them to call Dr. Ernestene Kiel office if she  vomits for more than 1 day and to encourage p.o. intake. If she is unable to  tolerate p.o.'s she needs to come to see Dr. Harvie Junior or someone in the  outpatient clinic. If she becomes as ill as last time, especially if she  seizes, she needs to go to the emergency room.      Clearance Coots, M.D.     IN/MEDQ  D:  06/09/2005  T:  06/10/2005  Job:  [id number]   cc:   Alvester Morin, M.D.  Fax: [phone]

## 2010-10-03 NOTE — Consult Note (Signed)
[facility]  Patient:    Wanda [name], Wanda [name]                     MRN: [id number] Adm. Date:  [id number] Attending:  Arsenio Loader Dictator:   Jacobo Forest, M.D. CC:         Anselmo Rod, M.D.  Arsenio Loader, M.D.  Rollene Rotunda, M.D. [facility]   Consultation Report  REASON FOR CONSULTATION:      Bright red blood per rectum.  ASSESSMENT:                    1. Gastrointestinal bleed-bright red blood per                                   rectum.                                2. Anemia.                                3. Hyperbilirubinemia.                                4. Coagulopathy.                                5. Hypoalbuminemia.                                6. Vent dependent secondary to respiratory                                   distress.                                7. Pericardial effusion with pericarditis.                                8. Leukopenia.                                9. 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                               10. Urinary tract infection, Proteus.                               11. Right hemiparesis secondary to  cerebrovascular accident (ruptured ______                                   aneurysm 1987).                               12. History of seizure disorder.                               13. Bilateral pulmonary infiltrate/atelectasis.  RECOMMENDATIONS:              1. Flexible sigmoidoscopy urgently today before                                  cardiac catheterization.                               2. Continue Protonix.  DISCUSSION:                   Wanda [name] is a 55 year old black female who is critically ill with unknown etiology for her acute illness.  Pericardial effusions, anemia, bony infarcts, and coagulopathy may suggest vasculitis, ?autoimmune.  Patient now found to have new onset  lower GI bleed with bright red blood per rectum and a declining hemoglobin.  With concern for ischemic colitis, will perform flexible sigmoidoscopy urgently.  Patient was seen and evaluated by Dr. Charna Elizabeth.  REVIEW OF SYSTEMS:            Unable to obtain.  Patient is intubated.  SOCIAL HISTORY:               Patient lives with her husband and three sons. Has a home health nurse to take care of her and her husband.  No alcohol.  No drugs.  Positive smoking few cigarettes per day.  As per old medical records.  FAMILY HISTORY:               Unobtainable.  Patient is intubated.  CURRENT MEDICATIONS:          1. Xopenox 3 cc q.6h.                               2. Protonix 40 mg IV.                               3. Vancomycin 1 g IV q.12h.                               4. Primaxin 500 mg IV q.8h.                               5. Morphine 2 mg IV p.r.n.                               6. Ativan 1 mg IV p.r.n.  7. Tylenol p.r.n.  ALLERGIES:                    PENICILLIN.  PHYSICAL EXAMINATION:  VITAL SIGNS:                  Tmax 102.7, T current 98.6, pulse 105, blood pressure 120/75.  GENERAL:                      Intubated.  Patient able to answer some "yes/no" questions, point using left hand.  Does make eye contact, but drowsy.  HEENT:                        Normocephalic, atraumatic.  NECK:                         No thyromegaly.  CARDIOVASCULAR:               Regular rate, tachycardic.  No murmurs.  RESPIRATORY:                  Diffuse rhonchi on ventilator.  ABDOMEN:                      Soft.  Bowel sounds present.  Mild diffuse tenderness.  RECTAL:                       Heme positive stool.  No masses.  LABORATORIES:                 Hemoglobin 9.3 after transfusion of 2 units packed red blood cells; hemoglobin 8.3 prior to transfusion.  Patient also had previously hemoglobin of 7.9 on April 30, 2000 and had 2 units packed red blood  cells transfused at that time as well.  INR 1.5 after vitamin K and FFP. Bleeding time 3.5, within normal limits.  Complete metabolic panel within normal limits except total bilirubin 2.6, albumin 2.0.  Total bilirubin on December 13 was normal 0.9. DD:  05/03/00 TD:  05/03/00 Job: [id number]

## 2010-10-03 NOTE — Procedures (Signed)
[place]  Patient:    Wanda [name], Wanda [name]                     MRN: [id number] Proc. Date: 05/04/00 Adm. Date:  [id number] Attending:  Arsenio Loader CC:         Barbaraann Share, M.D. [facility]   Procedure Report  DATE OF BIRTH:  [birth year].  REFERRING PHYSICIAN:  Barbaraann Share, M.D. [facility]  PROCEDURE PERFORMED:  Flexible sigmoidoscopy.  ENDOSCOPIST:  Anselmo Rod, M.D.  INSTRUMENT USED:  Olympus video colonoscope.  INDICATIONS FOR PROCEDURE:  Guaiac positive stool with drop in hemoglobin in a 55 year old female with a history of a stroke and evidence of vasculitis like changes in the ____________ and pericardium, rule out ischemic bowel.  PREPROCEDURE PREPARATION:  Informed consent was procured from the patient. The patient was fasted for eight hours prior to the procedure and prepped with a bottle of magnesium citrate and a gallon of NuLytely the night prior to the procedure.  PREPROCEDURE PHYSICAL:  The patient had stable vital signs.  Neck supple. Chest clear to auscultation.  Abdomen soft with normal abdominal bowel sounds.  DESCRIPTION OF PROCEDURE:  The patient was placed in the left lateral decubitus position and sedated with morphine and Ativan.  Once the patient was adequately sedated, the Olympus video colonoscope was advanced from the rectum to about 5 cm.  There was a large amount of solid stool in the colon and the procedure had to be aborted at this point.  The patient was to be taken to the cardiac catheterization lab right after flexible sigmoidoscopy and therefore repeat prep could not be given and the procedure could not be completed.  Therefore plans are to repeat the flexible sigmoidoscopy at a later date once the patient has had an adequate prep for the procedure. IMPRESSION:  RECOMMENDATIONS:DD:  05/03/00 TD:  05/04/00 Job: [id number]

## 2010-10-03 NOTE — Procedures (Signed)
[place]. [facility]  Patient:    Wanda [name], Wanda [name]                     MRN: [id number] Proc. Date: 04/09/00 Adm. Date:  [id number] Attending:  Farley Ly CC:         Alvester Morin, M.D.   Procedure Report  PROCEDURE:  Upper endoscopy, with biopsies.  ENDOSCOPIST:  Florencia Reasons, M.D.  INDICATIONS:  This 55 year old female with recent dark stools and anemia, thought to have recent GI bleeding.  FINDINGS:  Possible resolving Mallory-Weiss tear.  No active bleeding.  DESCRIPTION OF PROCEDURE:  The nature, purpose, and risks of the procedure had been discussed with the patient, who provided a written consent.  She w    as brought from her hospital room in a fasted state to the endoscopy unit, and sedated with fentanyl 50 mcg and Versed 5 mg IV, without arrhythmias or desaturation.  The Olympus video endoscope was passed under direct vision. The vocal cords were not well seen.  The esophagus was easily entered.  The proximal esophagus was normal.  At the end of the procedure it was noted that there was a little bit of mucoid blood in the distal esophagus, implying an element of friability, although no obvious esophagitis, erosions, or ulcerations were observed.  There appeared to be some slight mucosal irregularity in the distal esophagus, raising the question of a resolving Mallory-Weiss tear.  There was a well-formed widely patent esophageal ring at the squamocolumnar function, and below that was a 3.0 cm hiatal hernia.  The stomach contained no blood or coffee-ground material, just a medium-sized fairly clear residual, which was suctioned up.  The stomach was free of any Motrin-induced gastropathy, despite the history of antecedent Motrin use, and indeed no gastritis, erosions, ulcers, polyps, or masses were observed anywhere in the stomach, including a retroflexed view of the proximal stomach. The pylorus, duodenal bulb, and second duodenum  similarly looked normal. After obtaining distal esophageal biopsies, the scope was removed and the patient tolerated the procedure well, without apparent complications.  IMPRESSION: 1. No active bleeding or blood in the stomach at the time of this examination. 2. Questionable resolving Mallory-Weiss tear in the region of the    gastroesophageal junction, possibly accounting for recent gastrointestinal    bleeding, and associated with some degree of distal esophageal    friability without overt esophagitis. 3. Medium-sized hiatal hernia with widely patent esophageal ring. 4. No evident source of abdominal pain.  DISCUSSION:  There is some question in my mind as to whether or not this patient truly has had GI bleeding, since her stool was Hemoccult negative at the time of admission, and since her anemia is microcytic.  As of this time, i do not have a baseline hemoglobin available for comparison, which would be helpful in our analysis of the situation.  In any event, however, she certainly does not have anything worrisome in the upper GI tract at this time.  RECOMMENDATIONS: 1. Await pathology on esophageal biopsies to help rule out smoldering    esophagitis (doubt). 2. The patient may be converted to an oral PPI at this time.  I would    treat her with a PPI for one week, to help facilitate healing of    her possible Mallory-Weiss tear.  Therefore, she would only need to    stay on it if she had symptoms of heartburn or reflux. 3. Would consider  an abdominal ultrasound to look for gallstones, iron    studies to check for evidence of chronic iron deficiency, consider    a CT scan of the abdomen and pelvis for evaluation of abdominal pain,    and do multiple home Hemoccults to confirm the absence of ongoing GI    tract blood loss, in view of her anemia. DD:  04/09/00 TD:  04/09/00 Job: [id number]

## 2010-10-03 NOTE — Procedures (Signed)
[place]  Patient:    Wanda [name], Wanda [name]                     MRN: [id number] Proc. Date: 05/17/00 Adm. Date:  [id number] Disc. Date: [id number] Attending:  Arsenio Loader CC:         Arsenio Loader, M.D.  D. Karle Plumber, M.D.   Procedure Report  PROCEDURE PERFORMED:  Bronchoscopy.  OPERATOR:  Charlcie Cradle. Delford Field, M.D. [facility]  INDICATIONS FOR PROCEDURE:  Left lower lobe atelectasis, evaluate for mucous plug.  ANESTHESIA:  1% Xylocaine local.  PREOP MEDICATIONS:  Versed 3 mg, Demerol 30 mg IV push.  DESCRIPTION OF PROCEDURE:  The Olympus video bronchoscope was introduced into the left nares.  The upper airways were visualized and were unremarkable.  The entire tracheobronchial tree was visualized and revealed minimal amounts of mucous plugging in the left lower lobe.  No endobronchial lesions were seen. The mucous was suctioned free utilizing Mucomyst topically.  The patient tolerated the procedure well.  COMPLICATIONS:  None.  IMPRESSION:  Left lower lobe mucous plugging.  RECOMMENDATIONS:  Continue aggressive pulmonary toilet. DD:  05/17/00 TD:  05/17/00 Job: [id number]

## 2010-10-03 NOTE — Cardiovascular Report (Signed)
[place]. [facility]  Patient:    Wanda [name], Wanda [name]                     MRN: [id number] Proc. Date: 05/03/00 Adm. Date:  [id number] Attending:  Arsenio Loader CC:         Rollene Rotunda, M.D. [facility]   Cardiac Catheterization  PROCEDURE:  Catheterization.  CARDIOLOGIST:  Noralyn Pick. Eden Emms, M.D. 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  This study is done to assess hemodynamics.  The patient arrived in the lab intubated and sedated.  RESULTS:  Right heart pressures were as follows.  Mean right atrial pressure was 15 mmHg.  Right ventricular pressure was 38/15.  PA pressure was 35/16.  Mean pulmonary capillary wedge pressure was 16 mmHg.  Cardiac output was equal to 4.8 liters per minute.  Simultaneous RV and LV pressures were obtained.  There was concordance of pressures; however, the right ventricular and left ventricular end diastolic pressures had a dip in plateau quality and were superimposable.  We tried to take the patient off the ventilator to measure pressures without positive pressure ventilation; however, the patient did not tolerate this.  A pigtail catheter was used to measure LV pressure which was equal to 115/15.  IMPRESSION:  Wanda [name] does not have findings consistent with constriction. There was concordance with positive pressure ventilation.  However, her numbers are consistent with low pressure tamponade physiology. Her diastolic pressures are equal across the right and left side of the heart with superimposition of RV end diastolic and LV end diastolic pressures.  At this time, her cardiac output is not compromised, and she is not hypotensive.  The main indication for pericardial window, therefore, would still be diagnostics.  The patient tolerated the procedure well.  She had a short burst of PSVT at 160, but this was broken with the pigtail catheter by stimulation of  the ventricle. DD:  05/03/00 TD:  05/04/00 Job: [id number]

## 2010-10-03 NOTE — Group Therapy Note (Signed)
NAME:  Wanda Wanda [name], Wanda Wanda [name]                        ACCOUNT NO.:  [id number]   MEDICAL RECORD NO.:  [id number]                   PATIENT TYPE:  OUT   LOCATION:  [facility]                           FACILITY:  [facility]   PHYSICIAN:  Tinnie Gens, MD                     DATE OF BIRTH:  11-15-1955   DATE OF SERVICE:  12/14/2003                                    CLINIC NOTE   CHIEF COMPLAINT:  Abnormal uterine bleeding.   HISTORY OF PRESENT ILLNESS:  The patient is Wanda [name] 55 year old gravida 4 para 3-0-  1-3 who is referred from outpatient clinic for heavy vaginal bleeding and  anemia.  The patient is Wanda [name] 55 year old lady who has had Wanda [name] previous stroke and  is disabled.  The patient has had Wanda [name] negative EGD and colonoscopy in recent  years.  Her caregiver says that she has heavy vaginal bleeding for the first  2-3 days of her cycle.   PAST MEDICAL HISTORY:  Significant for the stroke, hypertension, asthma, and  arthritis.   SURGICAL HISTORY:  None that she has listed.   MEDICATIONS:  Effexor and Wanda [name] cholesterol pill.   ALLERGIES:  None known.   OBSTETRICAL HISTORY:  She is Wanda [name] G4 P3 with three previous vaginal deliveries  and one termination.   GYNECOLOGICAL HISTORY:  She has heavy periods.   FAMILY HISTORY:  Diabetes, coronary artery disease, hypertension.   SOCIAL HISTORY:  She is no longer Wanda [name] smoker.  She does not work.   REVIEW OF SYSTEMS:  Wanda [name] 14-point review of systems is reviewed and is  diffusely positive, most of which will be worked up by her primary care  physician.  Please see the GYN history in the patient's chart.  However, she  does have some hot flashes, vaginal odor and vaginal bleeding, and loss of  urine.   PHYSICAL EXAMINATION TODAY:  VITAL SIGNS:  She is afebrile and blood  pressure is 124/83, pulse is 72.  GENERAL:  She is well-developed, well-nourished black female who looks older  than her stated age.  She has Wanda [name] right hemiparesis with some contractures  noted in the hand.  ABDOMEN:  Soft, nontender, nondistended.  GENITOURINARY:  She has normal external female genitalia.  The vagina is  rugated and pink.  The cervix is visualized without lesions.  Pap smear is  obtained.  The uterus cannot be appreciated.  She has very hard rectus  muscles in her lower abdomen, nor could the adnexa be fully appreciated  either.   PROCEDURE:  The cervix was cleaned with Betadine x3.  Wanda [name] single tooth  tenaculum was placed on the anterior lip of the cervix and Wanda [name] Pipelle used to  sound the uterus to 8 cm and attempt made at endometrial biopsy.  The  patient tolerated the procedure well.   IMPRESSION:  1. Abnormal uterine bleeding.  2. Multiple other medical problems.   PLAN:  1. Pap smear and endometrial biopsy today to rule out carcinoma.  2. Could check thyroid and other laboratory values to see if she is close to     being menopausal or not.  3. Pelvic ultrasound to locate any pathology.  4. The patient is not Wanda [name] great surgical candidate but certainly Wanda [name]     hysterectomy would be an alternative for her.  However, there are other     strategies that we could look at including progesterone-only IUD for     control of bleeding if her pathology is negative.                                               Tinnie Gens, MD    TP/MEDQ  D:  12/14/2003  T:  12/14/2003  Job:  [id number]   cc:   [facility]

## 2010-11-04 ENCOUNTER — Ambulatory Visit (INDEPENDENT_AMBULATORY_CARE_PROVIDER_SITE_OTHER): Payer: Medicare Other | Admitting: Internal Medicine

## 2010-11-04 ENCOUNTER — Encounter: Payer: Self-pay | Admitting: Internal Medicine

## 2010-11-04 DIAGNOSIS — M79609 Pain in unspecified limb: Secondary | ICD-10-CM

## 2010-11-04 DIAGNOSIS — R32 Unspecified urinary incontinence: Secondary | ICD-10-CM

## 2010-11-04 MED ORDER — NAPROXEN 500 MG PO TABS: 500.0000 mg | ORAL_TABLET | Freq: Two times a day (BID) | ORAL | Status: DC

## 2010-11-04 NOTE — Assessment & Plan Note (Signed)
Patient has from what the home health nurse describes, stress incontinence made worse by dementia and stroke. This puts her at high risk for bed sores and UTI. She uses disposable diapers to keep herself clean but she is still having bedwetting frequently. I am not very enthusiastic on trying any medications for incontinence given her age. I would just give  instruction to the home health nurse about keeping her clean and dry Again she might be a good candidate for PACE program. Will refer her if problem continues.

## 2010-11-04 NOTE — Assessment & Plan Note (Signed)
She has chronic bilateral leg pain. The details are described in the history of present illness. She had x-rays of the right foot and leg in the past which shows chronic degenerative changes and osteopenia. She also has peripheral venous insufficiency which causes swelling and pain. She also has contractures from previous stroke and disuse atrophy. Combination of all these factors might be causing the pain. At this time I do not think she needs any extensive workup. I do think that she needs good physical therapy and to try to use that leg more often. She has a home health aide that works with her every day as far as PT is concerned. She is sometimes unable to work with PT because of pain. She has tried ibuprofen and Tylenol which hasn't worked. Try Naprosyn for short time along with emphasis on physical therapy. Her renal function is okay. Will ask her to come back in one to 2 months. If pain is not bad at that time, further imaging studies and special nonweightbearing physical therapy can be tried. She will also be a good candidate for pace program or SNF placement if she agrees.

## 2010-11-04 NOTE — Patient Instructions (Addendum)
Follow up in 2-3 months

## 2010-11-04 NOTE — Progress Notes (Signed)
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 Comes to the clinic complaining of pain in the right leg. The pain in the right leg is there since last many years. It is persistent throughout the day but much worse with bearing weight. She also has chronic venous insufficiency of lower extremity and her leg occasionally gets swollen which makes the pain worse. She also has some contractures and loss of use of her right upper and lower extremities which makes the pain worse as well. She has tried Tylenol and ibuprofen which is not helping. She has a home health aide who helps her with physical therapy which has helped with her pain so far. She has been having difficulty performing with physical therapy and weakness recently. She denies any loss of sensation. She is wheelchair bound. No other complaints. Compliant with medications.  Constitutional: Denies fever, chills, diaphoresis, appetite change and fatigue.  HEENT: Denies photophobia, eye pain, redness, hearing loss, ear pain, congestion, sore throat, rhinorrhea, sneezing, mouth sores, trouble swallowing, neck pain, neck stiffness and tinnitus.   Respiratory: Denies SOB, DOE, cough, chest tightness,  and wheezing.   Cardiovascular: Denies chest pain, palpitations and leg swelling.  Gastrointestinal: Denies nausea, vomiting, abdominal pain, diarrhea, constipation, blood in stool and abdominal distention.  Genitourinary: Denies dysuria, urgency, frequency, hematuria, flank pain and difficulty urinating.  has urinary incontinence and bed wetting Musculoskeletal: Denies myalgias, has back pain and right leg pain Skin: Denies pallor, rash and wound.  Neurological: Denies dizziness, seizures, syncope, weakness, light-headedness, numbness and headaches.  Hematological: Denies adenopathy. Easy bruising, personal or family bleeding history  Psychiatric/Behavioral:  Denies suicidal ideation, mood changes, confusion, nervousness, sleep disturbance and agitation  BP 113/73  Pulse 82  Temp(Src) 97 F (36.1 C) (Oral)  Ht 5\' 10"  (1.778 m)  General Appearance:    Alert, cooperative, no distress,  Head:    Normocephalic, without obvious abnormality, atraumatic  Eyes:    PERRL, conjunctiva/corneas clear, EOM's intact, fundi    benign, both eyes  Ears:    Normal TM's and external ear canals, both ears  Nose:   Nares normal, septum midline, mucosa normal, no drainage    or sinus tenderness  Throat:   Lips, mucosa, and tongue normal; teeth and gums normal  Neck:   Supple, symmetrical, trachea midline, no adenopathy;    thyroid:  no enlargement/tenderness/nodules; no carotid   bruit or JVD  Lungs:     Clear to auscultation bilaterally, respirations unlabored   Heart:    Regular rate and rhythm, S1 and S2 normal, no murmur, rub   or gallop     No tenderness, masses, or nipple abnormality  Abdomen:     Soft, non-tender, bowel sounds active all four quadrants,    no masses, no organomegaly  Extremities:   tenderness to deep palpation on the dorsum of the right foot and shin. 1+ pitting edema bilaterally. Normal pulsations. Fungal infection of the nails.   Pulses:   2+ and symmetric all extremities  Skin:   Skin color, texture, turgor normal, no rashes or lesions  Lymph nodes:   Cervical, supraclavicular, and axillary nodes normal  Neurologic:   Speech difficulties and rt sided hemiplagia from old stroke

## 2010-11-13 ENCOUNTER — Other Ambulatory Visit: Payer: Self-pay | Admitting: *Deleted

## 2010-11-14 MED ORDER — METOCLOPRAMIDE HCL 5 MG PO TABS: 5.0000 mg | ORAL_TABLET | Freq: Four times a day (QID) | ORAL | Status: DC

## 2010-11-14 MED ORDER — OMEPRAZOLE 20 MG PO CPDR: 20.0000 mg | DELAYED_RELEASE_CAPSULE | Freq: Every day | ORAL | Status: DC

## 2010-11-14 MED ORDER — VENLAFAXINE HCL ER 75 MG PO CP24: 75.0000 mg | ORAL_CAPSULE | Freq: Every day | ORAL | Status: DC

## 2010-11-20 ENCOUNTER — Telehealth: Payer: Self-pay | Admitting: *Deleted

## 2010-11-20 NOTE — Telephone Encounter (Signed)
Call from [facility]) for pt  She is asking for something for "bedwetting" She states pt has been wetting on herself for past 3 weeks.  At night she will get up but still wets the bed a lot. Bedwetting is also during the day. Caregiver states Dr Scot Dock told her if this keeps happening he would give them a pill for bedwetting! Pt drinks a lot of water, no signs of infection ( dysuria), no fever.  Please advise.-

## 2010-11-21 NOTE — Telephone Encounter (Signed)
Spoke w/ Wille Celeste and gave her the message then transferred her to front office for appt

## 2010-11-21 NOTE — Telephone Encounter (Signed)
It would be best for her to be seen in [facility] and evaluated rather than prescribing something by phone.

## 2010-11-27 ENCOUNTER — Ambulatory Visit (INDEPENDENT_AMBULATORY_CARE_PROVIDER_SITE_OTHER): Payer: Medicaid Other | Admitting: Internal Medicine

## 2010-11-27 ENCOUNTER — Encounter: Payer: Self-pay | Admitting: Internal Medicine

## 2010-11-27 VITALS — BP 119/75 | HR 73 | Temp 97.1°F | Ht 70.0 in | Wt 207.2 lb

## 2010-11-27 DIAGNOSIS — I10 Essential (primary) hypertension: Secondary | ICD-10-CM

## 2010-11-27 DIAGNOSIS — Z23 Encounter for immunization: Secondary | ICD-10-CM

## 2010-11-27 DIAGNOSIS — D509 Iron deficiency anemia, unspecified: Secondary | ICD-10-CM

## 2010-11-27 DIAGNOSIS — F329 Major depressive disorder, single episode, unspecified: Secondary | ICD-10-CM

## 2010-11-27 DIAGNOSIS — M79609 Pain in unspecified limb: Secondary | ICD-10-CM

## 2010-11-27 DIAGNOSIS — Z0000 Encounter for general adult medical examination without abnormal findings: Secondary | ICD-10-CM | POA: Insufficient documentation

## 2010-11-27 DIAGNOSIS — R32 Unspecified urinary incontinence: Secondary | ICD-10-CM

## 2010-11-27 LAB — URINALYSIS, ROUTINE W REFLEX MICROSCOPIC
Bilirubin Urine: NEGATIVE
Ketones, ur: NEGATIVE mg/dL
Specific Gravity, Urine: 1.013 (ref 1.005–1.030)
pH: 6 (ref 5.0–8.0)

## 2010-11-27 MED ORDER — GABAPENTIN 300 MG PO CAPS: ORAL_CAPSULE | ORAL | Status: DC

## 2010-11-27 NOTE — Assessment & Plan Note (Signed)
Denies depressed mood, no appetite no weight change, and denies suicidal ideation. -Continue the current regimen.

## 2010-11-27 NOTE — Assessment & Plan Note (Addendum)
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 Patient's caregiver reported that patient was not compliant with the previous physical therapy, and patient showed little effort to engage in active or passive range of motion. Patient and caregiver also stated that naproxen did not work for her foot pain. - Will prescribe Neurontin 300 mg capsule po daily and taper the dose up to 300 mg by mouth 3 times a day within 3 weeks -Will stop naproxen -Will arrange for physical therapy.

## 2010-11-27 NOTE — Assessment & Plan Note (Signed)
Patient takes her medications as prescribed per caregiver without any problems. -Will continue the current regimen.

## 2010-11-27 NOTE — Assessment & Plan Note (Signed)
Pt health maintenance all uptodate. Note: Patient had a history of TAH in 2009, she no longer needs Pap smear for cervical cancer screening. Will give patient pneumococcal vaccine today.

## 2010-11-27 NOTE — Progress Notes (Signed)
Subjective:    Patient ID: Wanda [name], female    DOB: 08-22-1955, 55 y.o.   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   Patient is A+Ox3, aphagia, and the history was provided by her caregiver. Pt able to nod or shake her head to yes/no questions. Stated that her right foot pain as same as last visit. mild to moderate aching. No worsening of symptoms, Newly prescribed Naprosyn did not work for pt. No recent injury or falls. I do not think that she need any imaging study at the point.  Pt has been non-compliant with her PT due to this chronic foot pain. Pt is wheelchair bound.  Caregiver also expressed concerns over pt's chronic urinary incontinence. No worsening of situation. Stated that urine has been clear with some smells to it recently. No dysuria noted. Pt uses disposable diapers. No decubitus ulcers noted.    Past Medical History  Diagnosis Date  . Anemia     iron deficiency, negative colonoscopy 2002 and EGD, Dr. Loreta Ave  . Depression   . GERD (gastroesophageal reflux disease)   . Hypertension   . 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 Stroke     ruptured berry aneurysm, right hemiparesis and expressive aphasia  . SLE (systemic lupus erythematosus)   . Venous insufficiency (chronic) (peripheral)    PSH TAH in 2009  History   Social History  . Marital Status: Widowed    Spouse Name: N/A    Number of Children: N/A  . Years of Education: N/A   Occupational History  . Not on file.   Social History Main Topics  . Smoking status: Former Smoker -- 1.5 packs/day    Types: Cigarettes    Quit date: 05/30/2003  . Smokeless tobacco: Never Used  . Alcohol Use: No  . Drug Use: No  . Sexually Active: Not on file   Other Topics Concern  . Not on file   Social History Narrative   Has a home nurse,  Lives with her 2 sons, Former smokerWidow   Current Outpatient Prescriptions on File Prior to Visit  Medication Sig Dispense Refill  . albuterol (PROVENTIL) (5 MG/ML) 0.5% nebulizer solution Take 2.5 mg by nebulization every 4 (four) hours as needed.        Marland Kitchen aspirin 81 MG tablet Take 81 mg by mouth daily.        . fluticasone (FLONASE) 50 MCG/ACT nasal spray 1 spray by Nasal route daily.        . hydrochlorothiazide 25 MG tablet Take 12.5 mg by mouth daily.        . hydroxychloroquine (PLAQUENIL) 200 MG tablet Take by mouth 2 (two) times daily.        . Incontinence Supply Disposable (PROTECTIVE UNDERWEAR MEDIUM) MISC by Does not apply route.        Marland Kitchen ipratropium (ATROVENT) 0.02 % nebulizer solution Take 500 mcg by nebulization every 6 (six) hours as needed.        . loratadine (CLARITIN) 10 MG tablet Take 10 mg by mouth daily.        . metoCLOPramide (REGLAN) 5 MG tablet Take 1 tablet (5 mg total) by mouth 4 (four) times daily.  120 tablet  6  . metoprolol (LOPRESSOR) 50 MG tablet Take 50 mg  by mouth 2 (two) times daily.        Marland Kitchen omeprazole (PRILOSEC) 20 MG capsule Take 1 capsule (20 mg total) by mouth daily.  31 capsule  6  . venlafaxine (EFFEXOR-XR) 75 MG 24 hr capsule Take 1 capsule (75 mg total) by mouth daily.  31 capsule  6  . zolpidem (AMBIEN) 5 MG tablet Take 5 mg by mouth at bedtime as needed.         Allergies  Allergen Reactions  . Penicillins Swelling     Review of Systems  No headache, fever, or sore throat. No shortness of breath or dyspnea on exertion. No chest pain, chest pressure or palpitation No nausea, vomiting, or abdominal pain. No melena, diarrhea or incontinence. No muscle weakness.                   Denies depression. No appetite or weight changes.      Objective:   Physical Exam  General: NAD, alert  and cooperative to examination.  Mouth: pharynx pink and moist. Lungs: normal respiratory effort, no accessory muscle use, normal breath sounds, no crackles,  and no wheezes. Heart: normal rate, regular rhythm, no murmur, no gallop, and no rub.  Abdomen: soft, non-tender, normal bowel sounds, no distention, no guarding, no rebound tenderness, no hepatomegaly, and no splenomegaly.  Msk: no joint swelling, no joint warmth, and no redness over joints.  Pulses: 1+ DP/PT pulses bilaterally Extremities: No cyanosis, clubbing, edema Neurologic: alert & oriented X3, aphagia, right side hemiparesis with right upper extremity contracture noted. Pt is wheelchair bound.  Denies dizziness, seizures, syncope, light-headedness, numbness and headaches.  sensation intact to light touch. All reflexes symmetrically and WNL. Skin: turgor normal and no rashes.  Psych: Oriented X3, normally interactive, good eye contact, not anxious appearing, and not depressed appearing.          Assessment & Plan:

## 2010-11-27 NOTE — Assessment & Plan Note (Addendum)
patient has chronic urinary incontinence problem. Patient has been using the disposable diapers without problems.  -Currently continue the nursing care  as instructed. Patient does not have any decubitus ulcer during this visit.     Change patient's diaper promptly when wet    Please use barrier cream with each diaper change.    Please arrange patient for toilet schedule every 1-2 hours    Please change patient's position every 2 hours.   Please assess patient's skin for any skin breakdown or decubitus ulcers, please report to the clinic should you suspect any signs of decubitus ulcers. -Will check UA and urine culture today.

## 2010-11-27 NOTE — Patient Instructions (Addendum)
1. Medication changes    STOP naproxen   Start Neurontin 300 mg capsule daily for first week, then increase to 300 mg capsule twice a day for second week, then increase to 300 mg capsule 3 times a day. 2. Will gave patient a pneumococcal vaccine during this visit. Patient had TAH in 2009, no Pap smear is needed anymore. 3. Will arrange the physical therapy for patient 4. for incontinence, will remain current nursing care, patient does not have any decubitus ulcer during this visit.   Will continue the current nursing care    Change patient's diaper promptly well wet    Please use barrier cream with each diaper change    Please arrange patient for toilet schedule every 1-2 hours    Please change patient's position every 2 hours   Please assess patient's skin for any skin breakdown or decubitus ulcers, please report to the clinic should you suspect any signs of decubitus ulcers.\ 5.Follow up with the clinic in 3 weeks 6. Will have patient's lipid panel at next clinic visit, patient and caregiver instructed to be fasting for the blood draw.

## 2010-11-27 NOTE — Assessment & Plan Note (Signed)
No complaining of any signs symptoms of anemia, and last CBC and ferritin within normal limits.

## 2010-11-28 LAB — URINALYSIS, MICROSCOPIC ONLY
Casts: NONE SEEN
Crystals: NONE SEEN

## 2010-11-28 NOTE — Progress Notes (Signed)
I saw, examined, and discussed the patient with Dr Dierdre Searles and agree with the note contained here.

## 2010-11-29 LAB — URINE CULTURE: Colony Count: >=100000

## 2010-12-01 ENCOUNTER — Telehealth: Payer: Self-pay | Admitting: *Deleted

## 2010-12-01 MED ORDER — NITROFURANTOIN MONOHYD MACRO 100 MG PO CAPS: 100.0000 mg | ORAL_CAPSULE | Freq: Two times a day (BID) | ORAL | Status: AC

## 2010-12-01 NOTE — Progress Notes (Signed)
Addended byDierdre Searles, Tidus Upchurch on: 12/01/2010 02:43 PM   Modules accepted: Orders

## 2010-12-01 NOTE — Telephone Encounter (Signed)
Call to pt's home spoke with her Aide.  Pt has a Urinary Tract infection.  Prescription will need to be called to [facility].  Prescription for Macrobid 100 mg 1 po bid for 7 days per order of Dr. Dierdre Searles.

## 2010-12-11 ENCOUNTER — Other Ambulatory Visit: Payer: Self-pay | Admitting: Internal Medicine

## 2010-12-16 ENCOUNTER — Encounter: Payer: Medicaid Other | Admitting: Internal Medicine

## 2010-12-22 ENCOUNTER — Telehealth: Payer: Self-pay | Admitting: *Deleted

## 2010-12-22 ENCOUNTER — Ambulatory Visit: Payer: Medicare Other | Attending: Internal Medicine | Admitting: Physical Therapy

## 2010-12-22 DIAGNOSIS — IMO0001 Reserved for inherently not codable concepts without codable children: Secondary | ICD-10-CM | POA: Insufficient documentation

## 2010-12-22 DIAGNOSIS — M25579 Pain in unspecified ankle and joints of unspecified foot: Secondary | ICD-10-CM | POA: Insufficient documentation

## 2010-12-22 DIAGNOSIS — R293 Abnormal posture: Secondary | ICD-10-CM | POA: Insufficient documentation

## 2010-12-22 DIAGNOSIS — M25569 Pain in unspecified knee: Secondary | ICD-10-CM | POA: Insufficient documentation

## 2010-12-22 DIAGNOSIS — I69998 Other sequelae following unspecified cerebrovascular disease: Secondary | ICD-10-CM | POA: Insufficient documentation

## 2010-12-22 DIAGNOSIS — R5381 Other malaise: Secondary | ICD-10-CM | POA: Insufficient documentation

## 2010-12-22 NOTE — Telephone Encounter (Signed)
Call from [place],  Physical Therapist at [facility]. [phone] ) She did evaluation on pt today. Foot/ankle pain  She wants to know if  "non weigh bearing" is an order or just an observation that you wanted her to evaluate. She did see pt and thinks it may only need to be Care giver training that is needed not PT.

## 2010-12-24 ENCOUNTER — Telehealth: Payer: Self-pay | Admitting: *Deleted

## 2010-12-24 ENCOUNTER — Ambulatory Visit: Payer: Medicare Other | Admitting: Physical Therapy

## 2010-12-24 NOTE — Telephone Encounter (Signed)
Call from [place], caretaker of pt.[phone]) She reports 1 week of loose stool.  Today noted blood in stool, blood was bright red, small amount. Noted when she wiped pt.  Pt has been vomiting today, 4 times.  Pt is drinking okay but vomits.  Denies  Fever. Pt states she is aching.  Feels weak.  Unable to bring pt in today.  Can get transportation tomorrow. Will this be okay?

## 2010-12-24 NOTE — Telephone Encounter (Signed)
Talked with Dr Scot Dock and "non weight bearing" was just an observation. PT informed.

## 2010-12-24 NOTE — Telephone Encounter (Signed)
I agree with the need to examine Wanda [name] and the scheduled appointment for tomorrow morning.

## 2010-12-25 ENCOUNTER — Encounter: Payer: Medicare Other | Admitting: Internal Medicine

## 2010-12-29 ENCOUNTER — Encounter: Payer: Medicare Other | Admitting: Physical Therapy

## 2011-01-02 ENCOUNTER — Telehealth: Payer: Self-pay | Admitting: *Deleted

## 2011-01-02 ENCOUNTER — Emergency Department (HOSPITAL_COMMUNITY): Payer: Medicare Other

## 2011-01-02 ENCOUNTER — Emergency Department (HOSPITAL_COMMUNITY)
Admission: EM | Admit: 2011-01-02 | Discharge: 2011-01-02 | Disposition: A | Discharge status: Home / Self Care | Payer: Medicare Other | Attending: Emergency Medicine | Admitting: Emergency Medicine

## 2011-01-02 ENCOUNTER — Encounter: Payer: Medicare Other | Admitting: Physical Therapy

## 2011-01-02 DIAGNOSIS — F3289 Other specified depressive episodes: Secondary | ICD-10-CM | POA: Insufficient documentation

## 2011-01-02 DIAGNOSIS — F329 Major depressive disorder, single episode, unspecified: Secondary | ICD-10-CM | POA: Insufficient documentation

## 2011-01-02 DIAGNOSIS — I509 Heart failure, unspecified: Secondary | ICD-10-CM | POA: Insufficient documentation

## 2011-01-02 DIAGNOSIS — W19XXXA Unspecified fall, initial encounter: Secondary | ICD-10-CM | POA: Insufficient documentation

## 2011-01-02 DIAGNOSIS — G40909 Epilepsy, unspecified, not intractable, without status epilepticus: Secondary | ICD-10-CM | POA: Insufficient documentation

## 2011-01-02 DIAGNOSIS — M79609 Pain in unspecified limb: Secondary | ICD-10-CM | POA: Insufficient documentation

## 2011-01-02 DIAGNOSIS — S82899A Other fracture of unspecified lower leg, initial encounter for closed fracture: Secondary | ICD-10-CM | POA: Insufficient documentation

## 2011-01-02 DIAGNOSIS — Z8673 Personal history of transient ischemic attack (TIA), and cerebral infarction without residual deficits: Secondary | ICD-10-CM | POA: Insufficient documentation

## 2011-01-02 DIAGNOSIS — I10 Essential (primary) hypertension: Secondary | ICD-10-CM | POA: Insufficient documentation

## 2011-01-02 DIAGNOSIS — M25559 Pain in unspecified hip: Secondary | ICD-10-CM | POA: Insufficient documentation

## 2011-01-02 DIAGNOSIS — J45909 Unspecified asthma, uncomplicated: Secondary | ICD-10-CM | POA: Insufficient documentation

## 2011-01-02 NOTE — Telephone Encounter (Signed)
I agree with plan

## 2011-01-02 NOTE — Telephone Encounter (Signed)
Call from [place], caregiver of pt.  She states pt fell 3 days ago, today when trying to move [place], she had severe pain to hip area. Pt has good appetite and no other complaints.   We have no openings today so advised evaluation in ED for possible fx of hip. Caregiver will arrange.

## 2011-01-06 ENCOUNTER — Encounter: Payer: Medicare Other | Admitting: Physical Therapy

## 2011-01-13 ENCOUNTER — Other Ambulatory Visit: Payer: Self-pay | Admitting: Internal Medicine

## 2011-02-11 LAB — CBC
HCT: 37.1
MCHC: 31.9
MCHC: 32.9
MCV: 77.2 — ABNORMAL LOW
MCV: 77.9 — ABNORMAL LOW
Platelets: 345
Platelets: 356
Platelets: 436 — ABNORMAL HIGH
RBC: 3.77 — ABNORMAL LOW
RBC: 4.27
RBC: 4.8
RDW: 16.9 — ABNORMAL HIGH
RDW: 17.1 — ABNORMAL HIGH
WBC: 10.5
WBC: 7.4

## 2011-02-11 LAB — BASIC METABOLIC PANEL
BUN: 10
BUN: 2 — ABNORMAL LOW
CO2: 29
Calcium: 9.3
Creatinine, Ser: 0.55
Creatinine, Ser: 0.65
GFR calc Af Amer: >60
GFR calc non Af Amer: >60
Glucose, Bld: 164 — ABNORMAL HIGH
Glucose, Bld: 85

## 2011-02-11 LAB — COMPREHENSIVE METABOLIC PANEL
ALT: 10
AST: 12
AST: 22
Albumin: 2.1 — ABNORMAL LOW
Albumin: 2.4 — ABNORMAL LOW
BUN: 1 — ABNORMAL LOW
CO2: 36 — ABNORMAL HIGH
Chloride: 95 — ABNORMAL LOW
Chloride: 96
Creatinine, Ser: 0.54
GFR calc Af Amer: >60
GFR calc Af Amer: >60
GFR calc non Af Amer: >60
Potassium: 3 — ABNORMAL LOW
Sodium: 135
Total Bilirubin: 0.3
Total Bilirubin: 0.4
Total Protein: 5.1 — ABNORMAL LOW

## 2011-03-02 LAB — CBC
HCT: 34.1 — ABNORMAL LOW
Hemoglobin: 10.8 — ABNORMAL LOW
MCV: 69.7 — ABNORMAL LOW
Platelets: 325
Platelets: 404 — ABNORMAL HIGH
RDW: 17.3 — ABNORMAL HIGH
WBC: 4.5

## 2011-03-02 LAB — CULTURE, BLOOD (ROUTINE X 2): Culture: NO GROWTH

## 2011-03-02 LAB — COMPREHENSIVE METABOLIC PANEL
Alkaline Phosphatase: 79
BUN: 7
Creatinine, Ser: 0.64
Glucose, Bld: 87
Potassium: 3.9
Total Bilirubin: 0.4
Total Protein: 7.6

## 2011-03-02 LAB — BASIC METABOLIC PANEL
BUN: 4 — ABNORMAL LOW
Creatinine, Ser: 0.57
GFR calc non Af Amer: >60

## 2011-03-02 LAB — URINALYSIS, ROUTINE W REFLEX MICROSCOPIC
Bilirubin Urine: NEGATIVE
Glucose, UA: NEGATIVE
Leukocytes, UA: NEGATIVE
Nitrite: NEGATIVE
Protein, ur: NEGATIVE
Specific Gravity, Urine: 1.005
Specific Gravity, Urine: 1.016
Urobilinogen, UA: 0.2
Urobilinogen, UA: 1
pH: 6.5

## 2011-03-02 LAB — URINE CULTURE
Colony Count: 75000
Special Requests: NEGATIVE

## 2011-03-02 LAB — DIFFERENTIAL
Basophils Absolute: 0
Basophils Relative: 0
Lymphocytes Relative: 45
Monocytes Relative: 5
Neutro Abs: 3.1
Neutrophils Relative %: 46

## 2011-03-02 LAB — URINE MICROSCOPIC-ADD ON

## 2011-03-02 LAB — CARDIAC PANEL(CRET KIN+CKTOT+MB+TROPI)
Relative Index: 1.2
Troponin I: 0.01

## 2011-03-02 LAB — VITAMIN B12: Vitamin B-12: 552 (ref 211–911)

## 2011-03-20 ENCOUNTER — Ambulatory Visit: Payer: Medicare Other

## 2011-03-23 ENCOUNTER — Other Ambulatory Visit: Payer: Self-pay | Admitting: Internal Medicine

## 2011-03-24 NOTE — Telephone Encounter (Signed)
Needs f/u appointment and LABWORK.

## 2011-04-16 ENCOUNTER — Encounter: Payer: Medicare Other | Admitting: Internal Medicine

## 2011-04-17 ENCOUNTER — Ambulatory Visit (INDEPENDENT_AMBULATORY_CARE_PROVIDER_SITE_OTHER): Payer: Medicare Other | Admitting: Internal Medicine

## 2011-04-17 ENCOUNTER — Encounter: Payer: Self-pay | Admitting: Internal Medicine

## 2011-04-17 VITALS — BP 117/80 | HR 83 | Temp 97.2°F | Ht 71.0 in

## 2011-04-17 DIAGNOSIS — R300 Dysuria: Secondary | ICD-10-CM

## 2011-04-17 DIAGNOSIS — N390 Urinary tract infection, site not specified: Secondary | ICD-10-CM

## 2011-04-17 DIAGNOSIS — Z23 Encounter for immunization: Secondary | ICD-10-CM

## 2011-04-17 DIAGNOSIS — I10 Essential (primary) hypertension: Secondary | ICD-10-CM

## 2011-04-17 DIAGNOSIS — Z8679 Personal history of other diseases of the circulatory system: Secondary | ICD-10-CM

## 2011-04-17 DIAGNOSIS — E785 Hyperlipidemia, unspecified: Secondary | ICD-10-CM

## 2011-04-17 MED ORDER — HYDROCODONE-IBUPROFEN 7.5-200 MG PO TABS: 1.0000 | ORAL_TABLET | Freq: Three times a day (TID) | ORAL | Status: DC | PRN

## 2011-04-17 MED ORDER — ALBUTEROL SULFATE (5 MG/ML) 0.5% IN NEBU: 2.5000 mg | INHALATION_SOLUTION | RESPIRATORY_TRACT | Status: DC | PRN

## 2011-04-17 MED ORDER — HYDROCODONE-ACETAMINOPHEN 5-500 MG PO TABS: 1.0000 | ORAL_TABLET | Freq: Every evening | ORAL | Status: DC | PRN

## 2011-04-17 NOTE — Assessment & Plan Note (Signed)
Patient is complaining of dysuria again today. She was recently treated with Macrobid for UTI. The cultures did show Escherichia coli that was sensitive to Macrobid but resistant to ciprofloxacin and multiple other medications. She has multiple UTIs in the past. She is chronically incontinent uses diapers. He probably also has some degree of atrophic vaginitis. Recheck UA again today. She may need chronic prophylactic medications for UTI.

## 2011-04-17 NOTE — Assessment & Plan Note (Signed)
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 She has chronic pain in this arm and hand. She is unable to sleep at night. Vicodin for a right ankle fracture has worked well for her for the arm as well. Her requirement for Vicodin is very low. We'll try low-dose Vicodin for now. Discussed with Dr. Phillips Odor

## 2011-04-17 NOTE — Assessment & Plan Note (Addendum)
Continue HCTZ and Lopressor and lisinopril. Check serum chemistry.

## 2011-04-17 NOTE — Progress Notes (Signed)
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 Comes to the clinic complaining of pain in the right leg.  She is with her nursing aide here. As per the patient does not sleep well at night because she has severe pain in her right hand from contractures. She was on Vicodin for a few weeks recently for her ankle fracture and she did well at that time.  Also complains that her urine is very foul-smelling but no abdominal pain or burning on urination. No vaginal discharge.  Physical exam BP 117/80  Pulse 83  Temp(Src) 97.2 F (36.2 C) (Oral)  Ht 5\' 11"  (1.803 m)  General Appearance:   no acute distress. She is sitting in a wheelchair. Inappropriate laughter   Head:    Normocephalic, without obvious abnormality, atraumatic  Eyes:    PERRL, conjunctiva/corneas clear, EOM's intact, fundi    benign, both eyes       Ears:    Normal TM's and external ear canals, both ears  Nose:   Nares normal, septum midline, mucosa normal, no drainage   or sinus tenderness  Back:     Symmetric, no curvature, ROM normal, no CVA tenderness  Lungs:     Clear to auscultation bilaterally, respirations unlabored  Heart:    Regular rate and rhythm, S1 and S2 normal, no murmur, rub   or gallop  Abdomen:     Soft, non-tender, bowel sounds active all four quadrants,    no masses, no organomegaly  Extremities:   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 Dorsum of the right hand is tender to palpation. Cost on the right foot from fall and ankle fracture 3 months ago. Extremities otherwise normal, atraumatic, no cyanosis or edema  Pulses:   2+ and symmetric all extremities  Skin:   Skin color, texture, turgor normal, no rashes or lesions  Lymph nodes:   Cervical, supraclavicular, and axillary nodes normal  Neurologic:   speech difficulty and right-sided hemiplegia from old stroke  Review of systems: As per history  of present illness. Otherwise Constitutional: Denies fever, chills, diaphoresis, appetite change and fatigue.  HEENT: Denies photophobia, eye pain, redness, hearing loss, ear pain, congestion, sore throat, rhinorrhea, sneezing, mouth sores, trouble swallowing, neck pain, neck stiffness and tinnitus.   Respiratory: Denies SOB, DOE, cough, chest tightness,  and wheezing.   Cardiovascular: Denies chest pain, palpitations and leg swelling.  Gastrointestinal: Denies nausea, vomiting, abdominal pain, diarrhea, constipation, blood in stool and abdominal distention.  Genitourinary: Denies dysuria, urgency, frequency, hematuria, flank pain and difficulty urinating.  Musculoskeletal: Denies myalgias, back pain, joint swelling, arthralgias and gait problem.  Skin: Denies pallor, rash and wound.  Neurological: Denies dizziness, seizures, syncope, weakness, light-headedness, numbness and headaches.  Hematological: Denies adenopathy. Easy bruising, personal or family bleeding history  Psychiatric/Behavioral: Denies suicidal ideation, mood changes, confusion, nervousness, sleep disturbance and agitation \

## 2011-04-17 NOTE — Patient Instructions (Signed)
Follow up in February 2013.  We will call you with antibiotic prescription if the urine shows infection Let us know if the pain medications started today are making her drowsy or sedated. Also let us know if she needs refill before her next appointment.

## 2011-04-18 ENCOUNTER — Other Ambulatory Visit: Payer: Self-pay | Admitting: Internal Medicine

## 2011-04-18 LAB — URINALYSIS, ROUTINE W REFLEX MICROSCOPIC
Glucose, UA: NEGATIVE mg/dL
Hgb urine dipstick: NEGATIVE
Leukocytes, UA: NEGATIVE
Protein, ur: 30 mg/dL — AB

## 2011-04-18 LAB — URINALYSIS, MICROSCOPIC ONLY
Casts: NONE SEEN
Crystals: NONE SEEN

## 2011-04-18 LAB — LIPID PANEL
HDL: 54 mg/dL (ref >39)
LDL Cholesterol: 126 mg/dL — ABNORMAL HIGH (ref 0–99)
Total CHOL/HDL Ratio: 3.6 Ratio

## 2011-04-18 LAB — BASIC METABOLIC PANEL
CO2: 26 mEq/L (ref 19–32)
Calcium: 9.6 mg/dL (ref 8.4–10.5)
Chloride: 97 mEq/L (ref 96–112)
Sodium: 137 mEq/L (ref 135–145)

## 2011-04-20 ENCOUNTER — Other Ambulatory Visit: Payer: Self-pay | Admitting: *Deleted

## 2011-04-20 MED ORDER — METOPROLOL TARTRATE 50 MG PO TABS: 50.0000 mg | ORAL_TABLET | Freq: Two times a day (BID) | ORAL | Status: DC

## 2011-04-20 MED ORDER — LISINOPRIL 10 MG PO TABS: 10.0000 mg | ORAL_TABLET | Freq: Every day | ORAL | Status: DC

## 2011-04-21 LAB — PRESCRIPTION ABUSE MONITORING 15P, URINE
Cannabinoid Scrn, Ur: NEGATIVE NG/ML
Cocaine Metabolites: NEGATIVE NG/ML
Creatinine, Urine: 103.7 MG/DL
Fentanyl, Ur: NEGATIVE NG/ML
Methadone Screen, Urine: NEGATIVE NG/ML
Oxycodone Screen, Ur: NEGATIVE NG/ML
Propoxyphene: NEGATIVE NG/ML

## 2011-04-21 NOTE — Progress Notes (Signed)
Addended by: Remus Blake on: 04/21/2011 03:24 PM   Modules accepted: Orders

## 2011-05-05 ENCOUNTER — Encounter: Payer: Medicare Other | Admitting: Internal Medicine

## 2011-05-15 ENCOUNTER — Other Ambulatory Visit: Payer: Self-pay | Admitting: Internal Medicine

## 2011-05-20 ENCOUNTER — Other Ambulatory Visit: Payer: Self-pay | Admitting: *Deleted

## 2011-05-20 MED ORDER — HYDROCHLOROTHIAZIDE 25 MG PO TABS: 25.0000 mg | ORAL_TABLET | Freq: Every day | ORAL | Status: DC

## 2011-05-20 MED ORDER — LISINOPRIL 10 MG PO TABS: 10.0000 mg | ORAL_TABLET | Freq: Every day | ORAL | Status: DC

## 2011-05-20 MED ORDER — METOPROLOL TARTRATE 50 MG PO TABS: 50.0000 mg | ORAL_TABLET | Freq: Two times a day (BID) | ORAL | Status: DC

## 2011-05-21 ENCOUNTER — Telehealth: Payer: Self-pay | Admitting: *Deleted

## 2011-05-21 DIAGNOSIS — S82843A Displaced bimalleolar fracture of unspecified lower leg, initial encounter for closed fracture: Secondary | ICD-10-CM | POA: Diagnosis not present

## 2011-05-21 NOTE — Telephone Encounter (Signed)
Why don't they try mucinex OTC or robitussin DM or time.

## 2011-05-21 NOTE — Telephone Encounter (Signed)
Call from [facility] pt's caretaker. Concern that pt has cough and congestion since Christmas and it's not getting better. She is getting neb rx for wheezing which helps her. Denies fever, eating and drinking well. She is getting up to wheelchair. Pt not getting better or worse.  Can you suggest another cough med to help with cough? She has been using robitussin. Do you want the pt seen? # [id number]

## 2011-05-21 NOTE — Telephone Encounter (Signed)
Caregiver informed and voices understanding.  She will call if any changes.

## 2011-06-24 ENCOUNTER — Other Ambulatory Visit: Payer: Self-pay | Admitting: Internal Medicine

## 2011-06-30 ENCOUNTER — Other Ambulatory Visit: Payer: Self-pay | Admitting: Internal Medicine

## 2011-07-20 ENCOUNTER — Other Ambulatory Visit: Payer: Self-pay | Admitting: Internal Medicine

## 2011-08-18 ENCOUNTER — Encounter: Payer: Medicare Other | Admitting: Internal Medicine

## 2011-08-20 ENCOUNTER — Encounter: Payer: Medicare Other | Admitting: Internal Medicine

## 2011-08-20 ENCOUNTER — Ambulatory Visit (INDEPENDENT_AMBULATORY_CARE_PROVIDER_SITE_OTHER): Payer: Medicare Other | Admitting: Internal Medicine

## 2011-08-20 ENCOUNTER — Encounter: Payer: Self-pay | Admitting: Internal Medicine

## 2011-08-20 VITALS — BP 128/81 | HR 97 | Temp 97.1°F | Ht 71.0 in

## 2011-08-20 DIAGNOSIS — Z0000 Encounter for general adult medical examination without abnormal findings: Secondary | ICD-10-CM

## 2011-08-20 DIAGNOSIS — M79609 Pain in unspecified limb: Secondary | ICD-10-CM | POA: Diagnosis not present

## 2011-08-20 DIAGNOSIS — M2450 Contracture, unspecified joint: Secondary | ICD-10-CM | POA: Diagnosis not present

## 2011-08-20 MED ORDER — HYDROCODONE-ACETAMINOPHEN 5-500 MG PO TABS: 1.0000 | ORAL_TABLET | Freq: Every evening | ORAL | Status: DC | PRN

## 2011-08-20 NOTE — Progress Notes (Signed)
Patient ID: Wanda [name], female   DOB: 03/19/1956, 56 y.o.   MRN: [id number]  56 year old woman with past medical history listed below comes for complain of pain in her legs and hands Guarding by her home health aide  gives a history Patient has contractures from her previous stroke and has some degree of dementia and depression. She does not move her hands and legs very often and is wheelchair-bound. She gets medication and assistance with ADLs by the home health aid and pharmacy   Physical exam   General Appearance:     Filed Vitals:   08/20/11 1349  BP: 128/81  Pulse: 97  Temp: 97.1 F (36.2 C)  TempSrc: Oral  Height: 5\' 11"  (1.803 m)     Wheelchair-bound , dysarthric , Alert, cooperative, no distress, appears stated age  Head:    Normocephalic, without obvious abnormality, atraumatic  Eyes:    PERRL, conjunctiva/corneas clear, EOM's intact, fundi    benign, both eyes       Neck:   Supple, symmetrical, trachea midline, no adenopathy;       thyroid:  No enlargement/tenderness/nodules; no carotid   bruit or JVD  Lungs:     Clear to auscultation bilaterally, respirations unlabored  Chest wall:    No tenderness or deformity  Heart:    Regular rate and rhythm, S1 and S2 normal, no murmur, rub   or gallop  Abdomen:     Soft, non-tender, bowel sounds active all four quadrants,    no masses, no organomegaly  Extremities:   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

## 2011-08-20 NOTE — Assessment & Plan Note (Signed)
Continues to have pain Noncompliant with Neurontin  Plan - Home health physical therapy - Neurontin 3 times a day explained to the home health aide - Vicodin as needed for breakthrough pain - Watch for sedation - Followup in one month

## 2011-08-20 NOTE — Progress Notes (Signed)
Addended by: Scot Dock, Jaylanni Eltringham V on: 08/20/2011 04:59 PM   Modules accepted: Orders

## 2011-08-24 ENCOUNTER — Telehealth: Payer: Self-pay | Admitting: Licensed Clinical Social Worker

## 2011-08-24 NOTE — Telephone Encounter (Signed)
Pt was referred for [facility] PT services for Flexion Contractures.  Pt has M/C and M/A.  CSW called pt to obtain preferred agency.  According to female family member that answered phone, pt has no preference and has used [facility] in the past.  Referral sent to [facility].

## 2011-09-02 ENCOUNTER — Other Ambulatory Visit: Payer: Self-pay | Admitting: Internal Medicine

## 2011-09-04 DIAGNOSIS — M810 Age-related osteoporosis without current pathological fracture: Secondary | ICD-10-CM | POA: Diagnosis not present

## 2011-09-04 DIAGNOSIS — E559 Vitamin D deficiency, unspecified: Secondary | ICD-10-CM | POA: Diagnosis not present

## 2011-09-07 DIAGNOSIS — M24573 Contracture, unspecified ankle: Secondary | ICD-10-CM | POA: Diagnosis not present

## 2011-09-07 DIAGNOSIS — R269 Unspecified abnormalities of gait and mobility: Secondary | ICD-10-CM | POA: Diagnosis not present

## 2011-09-07 DIAGNOSIS — IMO0001 Reserved for inherently not codable concepts without codable children: Secondary | ICD-10-CM | POA: Diagnosis not present

## 2011-09-07 DIAGNOSIS — M24576 Contracture, unspecified foot: Secondary | ICD-10-CM | POA: Diagnosis not present

## 2011-09-11 DIAGNOSIS — R269 Unspecified abnormalities of gait and mobility: Secondary | ICD-10-CM | POA: Diagnosis not present

## 2011-09-11 DIAGNOSIS — IMO0001 Reserved for inherently not codable concepts without codable children: Secondary | ICD-10-CM | POA: Diagnosis not present

## 2011-09-11 DIAGNOSIS — M24573 Contracture, unspecified ankle: Secondary | ICD-10-CM | POA: Diagnosis not present

## 2011-09-14 DIAGNOSIS — M24573 Contracture, unspecified ankle: Secondary | ICD-10-CM | POA: Diagnosis not present

## 2011-09-14 DIAGNOSIS — IMO0001 Reserved for inherently not codable concepts without codable children: Secondary | ICD-10-CM | POA: Diagnosis not present

## 2011-09-14 DIAGNOSIS — R269 Unspecified abnormalities of gait and mobility: Secondary | ICD-10-CM | POA: Diagnosis not present

## 2011-09-16 DIAGNOSIS — IMO0001 Reserved for inherently not codable concepts without codable children: Secondary | ICD-10-CM | POA: Diagnosis not present

## 2011-09-16 DIAGNOSIS — R269 Unspecified abnormalities of gait and mobility: Secondary | ICD-10-CM | POA: Diagnosis not present

## 2011-09-16 DIAGNOSIS — M24573 Contracture, unspecified ankle: Secondary | ICD-10-CM | POA: Diagnosis not present

## 2011-09-16 NOTE — Progress Notes (Signed)
Addended by: Neomia Dear on: 09/16/2011 05:48 PM   Modules accepted: Orders

## 2011-09-18 DIAGNOSIS — R269 Unspecified abnormalities of gait and mobility: Secondary | ICD-10-CM | POA: Diagnosis not present

## 2011-09-18 DIAGNOSIS — IMO0001 Reserved for inherently not codable concepts without codable children: Secondary | ICD-10-CM | POA: Diagnosis not present

## 2011-09-18 DIAGNOSIS — M24573 Contracture, unspecified ankle: Secondary | ICD-10-CM | POA: Diagnosis not present

## 2011-09-21 ENCOUNTER — Encounter: Payer: Medicare Other | Admitting: Ophthalmology

## 2011-09-21 DIAGNOSIS — M24576 Contracture, unspecified foot: Secondary | ICD-10-CM | POA: Diagnosis not present

## 2011-09-21 DIAGNOSIS — IMO0001 Reserved for inherently not codable concepts without codable children: Secondary | ICD-10-CM | POA: Diagnosis not present

## 2011-09-21 DIAGNOSIS — R269 Unspecified abnormalities of gait and mobility: Secondary | ICD-10-CM | POA: Diagnosis not present

## 2011-09-23 DIAGNOSIS — IMO0001 Reserved for inherently not codable concepts without codable children: Secondary | ICD-10-CM | POA: Diagnosis not present

## 2011-09-23 DIAGNOSIS — R269 Unspecified abnormalities of gait and mobility: Secondary | ICD-10-CM | POA: Diagnosis not present

## 2011-09-23 DIAGNOSIS — M24573 Contracture, unspecified ankle: Secondary | ICD-10-CM | POA: Diagnosis not present

## 2011-09-24 ENCOUNTER — Encounter: Payer: Medicare Other | Admitting: Ophthalmology

## 2011-09-25 DIAGNOSIS — R269 Unspecified abnormalities of gait and mobility: Secondary | ICD-10-CM | POA: Diagnosis not present

## 2011-09-25 DIAGNOSIS — IMO0001 Reserved for inherently not codable concepts without codable children: Secondary | ICD-10-CM | POA: Diagnosis not present

## 2011-09-25 DIAGNOSIS — M24573 Contracture, unspecified ankle: Secondary | ICD-10-CM | POA: Diagnosis not present

## 2011-09-28 DIAGNOSIS — R269 Unspecified abnormalities of gait and mobility: Secondary | ICD-10-CM | POA: Diagnosis not present

## 2011-09-28 DIAGNOSIS — IMO0001 Reserved for inherently not codable concepts without codable children: Secondary | ICD-10-CM | POA: Diagnosis not present

## 2011-09-28 DIAGNOSIS — M24576 Contracture, unspecified foot: Secondary | ICD-10-CM | POA: Diagnosis not present

## 2011-09-30 ENCOUNTER — Encounter: Payer: Medicare Other | Admitting: Internal Medicine

## 2011-10-02 DIAGNOSIS — R269 Unspecified abnormalities of gait and mobility: Secondary | ICD-10-CM | POA: Diagnosis not present

## 2011-10-02 DIAGNOSIS — M24576 Contracture, unspecified foot: Secondary | ICD-10-CM | POA: Diagnosis not present

## 2011-10-02 DIAGNOSIS — IMO0001 Reserved for inherently not codable concepts without codable children: Secondary | ICD-10-CM | POA: Diagnosis not present

## 2011-10-05 ENCOUNTER — Emergency Department (HOSPITAL_COMMUNITY): Payer: Medicare Other

## 2011-10-05 ENCOUNTER — Telehealth: Payer: Self-pay | Admitting: *Deleted

## 2011-10-05 ENCOUNTER — Encounter (HOSPITAL_COMMUNITY): Payer: Self-pay | Admitting: Emergency Medicine

## 2011-10-05 ENCOUNTER — Emergency Department (HOSPITAL_COMMUNITY)
Admission: EM | Admit: 2011-10-05 | Discharge: 2011-10-06 | Disposition: A | Discharge status: Home / Self Care | Payer: Medicare Other | Attending: Emergency Medicine | Admitting: Emergency Medicine

## 2011-10-05 DIAGNOSIS — L0390 Cellulitis, unspecified: Secondary | ICD-10-CM

## 2011-10-05 DIAGNOSIS — L930 Discoid lupus erythematosus: Secondary | ICD-10-CM | POA: Diagnosis not present

## 2011-10-05 DIAGNOSIS — M19079 Primary osteoarthritis, unspecified ankle and foot: Secondary | ICD-10-CM | POA: Diagnosis not present

## 2011-10-05 DIAGNOSIS — M25569 Pain in unspecified knee: Secondary | ICD-10-CM | POA: Diagnosis not present

## 2011-10-05 DIAGNOSIS — Z79899 Other long term (current) drug therapy: Secondary | ICD-10-CM | POA: Diagnosis not present

## 2011-10-05 DIAGNOSIS — I10 Essential (primary) hypertension: Secondary | ICD-10-CM | POA: Insufficient documentation

## 2011-10-05 DIAGNOSIS — R05 Cough: Secondary | ICD-10-CM | POA: Insufficient documentation

## 2011-10-05 DIAGNOSIS — M899 Disorder of bone, unspecified: Secondary | ICD-10-CM | POA: Diagnosis not present

## 2011-10-05 DIAGNOSIS — R059 Cough, unspecified: Secondary | ICD-10-CM | POA: Insufficient documentation

## 2011-10-05 DIAGNOSIS — K219 Gastro-esophageal reflux disease without esophagitis: Secondary | ICD-10-CM | POA: Insufficient documentation

## 2011-10-05 DIAGNOSIS — L02419 Cutaneous abscess of limb, unspecified: Secondary | ICD-10-CM | POA: Insufficient documentation

## 2011-10-05 DIAGNOSIS — L03119 Cellulitis of unspecified part of limb: Secondary | ICD-10-CM | POA: Insufficient documentation

## 2011-10-05 DIAGNOSIS — J984 Other disorders of lung: Secondary | ICD-10-CM | POA: Diagnosis not present

## 2011-10-05 DIAGNOSIS — Z8673 Personal history of transient ischemic attack (TIA), and cerebral infarction without residual deficits: Secondary | ICD-10-CM | POA: Insufficient documentation

## 2011-10-05 DIAGNOSIS — J9819 Other pulmonary collapse: Secondary | ICD-10-CM | POA: Diagnosis not present

## 2011-10-05 DIAGNOSIS — M25579 Pain in unspecified ankle and joints of unspecified foot: Secondary | ICD-10-CM | POA: Diagnosis not present

## 2011-10-05 LAB — BASIC METABOLIC PANEL
CO2: 27 mEq/L (ref 19–32)
Calcium: 9 mg/dL (ref 8.4–10.5)
Chloride: 93 mEq/L — ABNORMAL LOW (ref 96–112)
Creatinine, Ser: 0.74 mg/dL (ref 0.50–1.10)
Glucose, Bld: 100 mg/dL — ABNORMAL HIGH (ref 70–99)

## 2011-10-05 LAB — DIFFERENTIAL
Eosinophils Relative: 2 % (ref 0–5)
Lymphocytes Relative: 44 % (ref 12–46)
Lymphs Abs: 2.4 10*3/uL (ref 0.7–4.0)
Monocytes Absolute: 0.5 10*3/uL (ref 0.1–1.0)
Monocytes Relative: 10 % (ref 3–12)
Neutro Abs: 2.4 10*3/uL (ref 1.7–7.7)

## 2011-10-05 LAB — CBC
HCT: 39.7 % (ref 36.0–46.0)
Hemoglobin: 12.8 g/dL (ref 12.0–15.0)
MCV: 80.4 fL (ref 78.0–100.0)
RBC: 4.94 MIL/uL (ref 3.87–5.11)
WBC: 5.5 10*3/uL (ref 4.0–10.5)

## 2011-10-05 NOTE — Telephone Encounter (Signed)
Talked with Dr Meredith Pel and he recommends pt come to the ED for evaluation now as she is vomiting her meds and has been since Friday. Caregiver called and informed.  She voices understanding.

## 2011-10-05 NOTE — ED Provider Notes (Signed)
History     CSN: [id number]  Arrival date & time 10/05/11  2110   First MD Initiated Contact with Patient 10/05/11 2225      Chief Complaint  Patient presents with  . Ankle Pain    (Consider location/radiation/quality/duration/timing/severity/associated sxs/prior treatment) HPI Comments: Her caregiver.  Patient has had progressive left foot, and ankle swelling, over the past 2, weeks.  Today, he developed cough, nausea, and vomiting, unable to tolerate fluids or solid foods.  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  Caregiver.  Denies that patient has fallen or injured her right foot, ankle, calf area  She is followed by outpatient clinics.  Could not see her until Thursday  Patient is a 56 y.o. female presenting with ankle pain. The history is provided by a caregiver.  Ankle Pain  The incident occurred more than 1 week ago. The incident occurred at home. There was no injury mechanism.    Past Medical History  Diagnosis Date  . Anemia     iron deficiency, negative colonoscopy 2002 and EGD, Dr. Loreta Ave  . Depression   . GERD (gastroesophageal reflux disease)   . Hypertension   . 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 Stroke     ruptured berry aneurysm, right hemiparesis and expressive aphasia  . SLE (systemic lupus erythematosus)   . Venous insufficiency (chronic) (peripheral)   . Osteopenia     History reviewed. No pertinent past surgical history.  No family history on file.  History  Substance Use Topics  . Smoking status: Former Smoker -- 1.5 packs/day    Types: Cigarettes    Quit date: 05/30/2003  . Smokeless tobacco: Never Used  . Alcohol Use: No    OB History    Grav Para Term Preterm Abortions TAB SAB Ect Mult Living                  Review of Systems  Constitutional: Negative for fever.  Respiratory: Positive for cough.   Musculoskeletal: Positive for joint swelling.    Allergies   Penicillins  Home Medications   Current Outpatient Rx  Name Route Sig Dispense Refill  . ALBUTEROL SULFATE (5 MG/ML) 0.5% IN NEBU Nebulization Take 2.5 mg by nebulization every 4 (four) hours as needed. For shortness of breath    . ALENDRONATE SODIUM 10 MG PO TABS Oral Take 10 mg by mouth daily before breakfast. Take with a full glass of water on an empty stomach.     . AMLODIPINE BESYLATE 10 MG PO TABS Oral Take 10 mg by mouth daily.    . ASPIRIN 81 MG PO TABS Oral Take 81 mg by mouth daily.      Marland Kitchen CALCIUM-VITAMIN D 250-125 MG-UNIT PO TABS Oral Take 1 tablet by mouth daily.      Marland Kitchen GABAPENTIN 300 MG PO CAPS Oral Take 300 mg by mouth 3 (three) times daily.    Marland Kitchen HYDROCHLOROTHIAZIDE 25 MG PO TABS Oral Take 25 mg by mouth daily.    Marland Kitchen HYDROXYCHLOROQUINE SULFATE 200 MG PO TABS Oral Take 200 mg by mouth 2 (two) times daily.    Marland Kitchen LEVETIRACETAM 500 MG PO TABS Oral Take 500 mg by mouth every 12 (twelve) hours.    Marland Kitchen LISINOPRIL 10 MG PO TABS Oral Take 10 mg by mouth daily.    Marland Kitchen LORATADINE 10 MG PO TABS Oral Take 10 mg by mouth daily.      Marland Kitchen  METOCLOPRAMIDE HCL 5 MG PO TABS Oral Take 5 mg by mouth 4 (four) times daily.    Marland Kitchen METOPROLOL TARTRATE 50 MG PO TABS Oral Take 50 mg by mouth 2 (two) times daily.    Marland Kitchen OMEPRAZOLE 20 MG PO CPDR Oral Take 20 mg by mouth daily.    . VENLAFAXINE HCL ER 75 MG PO CP24 Oral Take 75 mg by mouth daily.    Marland Kitchen ZOLPIDEM TARTRATE 5 MG PO TABS Oral Take 5 mg by mouth at bedtime as needed. For insomnia    . CLARITHROMYCIN 250 MG PO TABS Oral Take 1 tablet (250 mg total) by mouth every 12 (twelve) hours. 28 tablet 0  . HYDROCODONE-ACETAMINOPHEN 5-500 MG PO TABS Oral Take 1 tablet by mouth at bedtime as needed. For pain    . PROTECTIVE UNDERWEAR MEDIUM MISC Does not apply by Does not apply route.      Marland Kitchen PROMETHAZINE HCL 12.5 MG PO TABS Oral Take 1 tablet (12.5 mg total) by mouth every 8 (eight) hours as needed for nausea. 15 tablet 0    BP 126/77  Pulse 84  Temp(Src) 97.9 F  (36.6 C) (Oral)  Resp 19  SpO2 94%  Physical Exam  Constitutional: She appears well-developed and well-nourished.  HENT:  Head: Normocephalic.  Eyes: Pupils are equal, round, and reactive to light.  Cardiovascular: Normal rate.   Pulmonary/Chest: Effort normal.       Coughing  Abdominal: She exhibits no distension.  Musculoskeletal:       Right-sided paralysis  Neurological: She is alert.  Skin:       Right foot, and ankle, swollen, but not edematous.  Mild erythema of the skin of the lateral foot is discolored, dark in skin is dry and flaking and there are superficial scabs over the distal portion of the left lateral foot    ED Course  Procedures (including critical care time)  Labs Reviewed  CBC - Abnormal; Notable for the following:    MCH 25.9 (*)    All other components within normal limits  BASIC METABOLIC PANEL - Abnormal; Notable for the following:    Sodium 131 (*)    Chloride 93 (*)    Glucose, Bld 100 (*)    All other components within normal limits  DIFFERENTIAL   Dg Chest 2 View  10/05/2011  *RADIOLOGY REPORT*  Clinical Data: Cough.  CHEST - 2 VIEW  Comparison: Two-view chest x-ray 05/30/2010, 05/15/2010, 12/14/2006.  Findings: Suboptimal inspiration due to body habitus which accounts for crowded bronchovascular markings at the bases and accentuates the cardiac silhouette.  Stable chronic marked elevation of the left hemidiaphragm.  Cardiac silhouette difficult to visualize as a result.  Stable chronic linear atelectasis and/or scarring in the left lower lobe and lingula.  No new pulmonary parenchymal abnormalities.  Pulmonary vascularity normal without evidence of pulmonary edema.  IMPRESSION: Suboptimal inspiration.  Stable chronic elevation of the left hemidiaphragm and chronic scar/atelectasis in the left lower lobe and lingula.  No acute cardiopulmonary disease.  Original Report Authenticated By: Arnell Sieving, M.D.   Dg Ankle Complete Right  10/05/2011   *RADIOLOGY REPORT*  Clinical Data: Right ankle pain and stiffness.  RIGHT ANKLE - COMPLETE 3+ VIEW  Comparison: 01/02/2011  Findings: Severe osteopenia significantly limits evaluation for a nondisplaced fracture. Cortical irregularity along the fibular tip and posterior tibia may reflect sequelae of prior trauma. Midfoot degenerative changes and poorly visualized intercarpal articulations.  Enthesopathic changes along the calcaneal surface. Mild diffuse  soft tissue swelling.  IMPRESSION: Severe osteopenia.  Degenerative changes.  If clinical concern for an acute fracture persists, recommend a repeat radiograph in 7-10 days to evaluate for interval change or callus formation.  Original Report Authenticated By: Waneta Martins, M.D.     1. Cellulitis     Discussed, treatment plan with patient and caregiver.  They're agreeable to this.  They had an appointment on Thursday with her primary care physician at family practice.  I have changed her antiemetic from Zofran to 12.5 mg of Phenergan every 8 hours as needed.  For nausea and vomiting as well as added clarithromycin, do, to her penicillin allergy to treat the cellulitis  MDM   Concern for cellulitis versus fracture of the right ankle, foot area.  We'll x-ray chest due to cough, nausea, check labs, due to the vomiting        Arman Filter, NP 10/06/11 0055  Arman Filter, NP 10/06/11 [id number]

## 2011-10-05 NOTE — Telephone Encounter (Signed)
Call from caregiver stating pt has been vomiting on and off since Friday and not able to keep her meds down. She is drinking and able to keep some food down.  She also states pt has swelling to bil feet and some SOB with movement from wheelchair to bed.  Pt is alert with no other c/o   Medicaid transportation can not bring the pt in before 5/23.  They were told this was an emergency but they have no openings.  I asked Caregiver to watch pt closely  and bring pt to ED by EMS if she continues to vomit and not able to keep fluids down, or if she has increase swelling to legs or increase SOB.  Please call back for any changes.  I stressed pt should be seen tomorrow but they have no other way of getting pt here.  Contact # [phone]

## 2011-10-05 NOTE — Telephone Encounter (Signed)
With these symptoms, referral to ED is appropriate, by EMS if needed.

## 2011-10-05 NOTE — ED Notes (Signed)
PT. ARRIVED WITH EMS FROM HOME , CAREGIVER REPORTS PROGRESSING RIGHT ANKLE/LOWER LEG SWELLING/ EDEMA FOR 2 WEEKS DENIES INJURY OR FALL , ADVISED BY PCP TO GO TO ER FOR FURTHER EVALUATION .

## 2011-10-06 DIAGNOSIS — R6889 Other general symptoms and signs: Secondary | ICD-10-CM | POA: Diagnosis not present

## 2011-10-06 DIAGNOSIS — B999 Unspecified infectious disease: Secondary | ICD-10-CM | POA: Diagnosis not present

## 2011-10-06 MED ORDER — CLARITHROMYCIN 250 MG PO TABS
250.0000 mg | ORAL_TABLET | ORAL | Status: AC
  Administered 2011-10-06: 250 mg via ORAL
  Filled 2011-10-06: qty 1

## 2011-10-06 MED ORDER — PROMETHAZINE HCL 12.5 MG PO TABS: 12.5000 mg | ORAL_TABLET | Freq: Three times a day (TID) | ORAL | Status: DC | PRN

## 2011-10-06 MED ORDER — PROMETHAZINE HCL 12.5 MG PO TABS
12.5000 mg | ORAL_TABLET | ORAL | Status: AC
  Administered 2011-10-06: 12.5 mg via ORAL
  Filled 2011-10-06: qty 1

## 2011-10-06 MED ORDER — CLARITHROMYCIN 250 MG PO TABS: 250.0000 mg | ORAL_TABLET | Freq: Two times a day (BID) | ORAL | Status: AC

## 2011-10-06 NOTE — Discharge Instructions (Signed)
Cellulitis Cellulitis is an infection of the tissue under the skin. The infected area is usually red and tender. This is caused by germs. These germs enter the body through cuts or sores. This usually happens in the arms or lower legs. HOME CARE   Take your medicine as told. Finish it even if you start to feel better.   If the infection is on the arm or leg, keep it raised (elevated).   Use a warm cloth on the infected area several times a day.   See your doctor for a follow-up visit as told.  GET HELP RIGHT AWAY IF:   You are tired or confused.   You throw up (vomit).   You have watery poop (diarrhea).   You feel ill and have muscle aches.   You have a fever.  MAKE SURE YOU:   Understand these instructions.   Will watch your condition.   Will get help right away if you are not doing well or get worse.  Document Released: 10/21/2007 Document Revised: 04/23/2011 Document Reviewed: 04/05/2009 [employer] Patient Information 2012 [employer]. 

## 2011-10-06 NOTE — ED Notes (Signed)
[facility] called for transport back to [facility] per caretaker

## 2011-10-06 NOTE — ED Notes (Addendum)
Offered pt to change her at this time (before ambulance arrives).  Pt declines this (shakes her head no).  Explained discharge instructions to pt and gave d/c papers to her.

## 2011-10-06 NOTE — ED Notes (Signed)
Called pt son Barbara Cower [place] and notified him that his mother will be transported back to her residence at this time.

## 2011-10-08 ENCOUNTER — Ambulatory Visit (INDEPENDENT_AMBULATORY_CARE_PROVIDER_SITE_OTHER): Payer: Medicare Other | Admitting: Internal Medicine

## 2011-10-08 ENCOUNTER — Encounter: Payer: Self-pay | Admitting: Internal Medicine

## 2011-10-08 VITALS — BP 109/75 | HR 79 | Temp 97.1°F | Ht 71.0 in | Wt 192.2 lb

## 2011-10-08 DIAGNOSIS — R569 Unspecified convulsions: Secondary | ICD-10-CM | POA: Diagnosis not present

## 2011-10-08 DIAGNOSIS — J45909 Unspecified asthma, uncomplicated: Secondary | ICD-10-CM

## 2011-10-08 DIAGNOSIS — D509 Iron deficiency anemia, unspecified: Secondary | ICD-10-CM | POA: Diagnosis not present

## 2011-10-08 DIAGNOSIS — M79609 Pain in unspecified limb: Secondary | ICD-10-CM | POA: Diagnosis not present

## 2011-10-08 DIAGNOSIS — I10 Essential (primary) hypertension: Secondary | ICD-10-CM

## 2011-10-08 MED ORDER — AMLODIPINE BESYLATE 10 MG PO TABS: 5.0000 mg | ORAL_TABLET | Freq: Every day | ORAL | Status: DC

## 2011-10-08 MED ORDER — ALBUTEROL SULFATE (5 MG/ML) 0.5% IN NEBU: 2.5000 mg | INHALATION_SOLUTION | RESPIRATORY_TRACT | Status: DC | PRN

## 2011-10-08 NOTE — Assessment & Plan Note (Signed)
Lab Results  Component Value Date   NA 131* 10/05/2011   K 3.8 10/05/2011   CL 93* 10/05/2011   CO2 27 10/05/2011   BUN 10 10/05/2011   CREATININE 0.74 10/05/2011   CREATININE 0.73 04/17/2011    BP Readings from Last 3 Encounters:  10/08/11 109/75  10/06/11 111/54  08/20/11 128/81    Assessment: Hypertension control:  controlled  Progress toward goals:  at goal Barriers to meeting goals:  no barriers identified  Plan: Hypertension treatment:  continue current medications Blood pressure is well controlled, however patient does feel lightheaded at times. Will reduce Norvasc to 5 mg, and if BP still well controlled, hope to discontinue.

## 2011-10-08 NOTE — Assessment & Plan Note (Signed)
Improved with Neurontin. Recent cellulitis infection of right ankle now improved with antibiotics. No new recommendations.

## 2011-10-08 NOTE — Progress Notes (Signed)
Subjective:     Patient ID: Wanda [name], female   DOB: 07/31/1955, 56 y.o.   MRN: [id number]  HPI Wanda [name] is a 56 year old woman with past medical history significant for hypertension, lupus, cerebrovascular accident with resultant right hemiparesis who presents today with her son for emergency room followup. Patient was seen in DER on 10/05/2011 for evaluation of right ankle pain. She was thought to have a mild right ankle cellulitis and thus was prescribed antibiotics. Patient's ankle swelling has decreased significantly. She denies fevers and chills. Patient's son reports the patient has had no further complications since discharge from the emergency room. She is also undergoing physical therapy which patient's son reports has helped her significantly.  Patient has no other complaints or concerns today. She denies chest pain, cough, sob, headache, N/V, changes in abdominal and urinary character.   Review of Systems  All other systems reviewed and are negative.       Objective:   Physical Exam  Constitutional: She is oriented to person, place, and time. She appears well-developed.  HENT:  Head: Normocephalic and atraumatic.  Neck: Normal range of motion.  Cardiovascular: Normal rate.   Pulmonary/Chest: Effort normal and breath sounds normal.  Abdominal: Soft. Bowel sounds are normal.  Musculoskeletal:       Right ankle with minimal swelling. No erythema. Chronic eczema changes noted over skin. Right-sided hemiparesis with some movement of right leg. Wheelchair-bound  Neurological: She is alert and oriented to person, place, and time.       Difficulty with speech expressive aphasia

## 2011-10-08 NOTE — Patient Instructions (Addendum)
Please follow up if your ankle pain and swelling does not get better, otherwise follow up in 1 month. Please take new dose of Norvasc (Amlodipine) which will be 5 mg once a day and not 10 mg.

## 2011-10-08 NOTE — ED Provider Notes (Signed)
Medical screening examination/treatment/procedure(s) were performed by non-physician practitioner and as supervising physician I was immediately available for consultation/collaboration.  Geanine Vandekamp, MD 10/08/11 2353 

## 2011-10-08 NOTE — Assessment & Plan Note (Signed)
No recent seizure activity reported. Patient has not had a seizure in a couple of years. Patient is on Keppra for seizure control.

## 2011-10-08 NOTE — Assessment & Plan Note (Signed)
Ferritin was on lower end, only 17. We'll go ahead and check anemia panel as it has not been checked in over 3 years. Patient does not complain of any new symptoms of fatigue. Believe her anemia is a combination of iron deficiency as well as chronic disease.

## 2011-10-09 LAB — ANEMIA PANEL
Iron: 36 ug/dL — ABNORMAL LOW (ref 42–145)
RBC.: 5.54 MIL/uL — ABNORMAL HIGH (ref 3.87–5.11)
Retic Ct Pct: 0.9 % (ref 0.4–2.3)
UIBC: 341 ug/dL (ref 125–400)
Vitamin B-12: 909 pg/mL (ref 211–911)

## 2011-10-21 ENCOUNTER — Encounter: Payer: Medicare Other | Admitting: Internal Medicine

## 2011-12-01 ENCOUNTER — Telehealth: Payer: Self-pay | Admitting: *Deleted

## 2011-12-01 NOTE — Telephone Encounter (Signed)
Caregiver calls and asks if something could be called in for pt for cough and N&V like "she had about 6 months ago", it is suggested she be seen in office or urg care, she states she must call transportation, appt is set per chilonb. For fri 7/19 @ 1345. She is instructed that if pt becomes worse to please call 911, she is agreeable.

## 2011-12-04 ENCOUNTER — Encounter: Payer: Self-pay | Admitting: Internal Medicine

## 2011-12-04 ENCOUNTER — Ambulatory Visit (INDEPENDENT_AMBULATORY_CARE_PROVIDER_SITE_OTHER): Payer: Medicare Other | Admitting: Internal Medicine

## 2011-12-04 ENCOUNTER — Ambulatory Visit (HOSPITAL_COMMUNITY)
Admission: RE | Admit: 2011-12-04 | Discharge: 2011-12-04 | Disposition: A | Discharge status: Home / Self Care | Payer: Medicare Other | Source: Ambulatory Visit | Attending: Internal Medicine | Admitting: Internal Medicine

## 2011-12-04 VITALS — BP 107/70 | HR 85 | Temp 97.6°F | Ht 71.0 in | Wt 192.3 lb

## 2011-12-04 DIAGNOSIS — J986 Disorders of diaphragm: Secondary | ICD-10-CM | POA: Diagnosis not present

## 2011-12-04 DIAGNOSIS — I10 Essential (primary) hypertension: Secondary | ICD-10-CM

## 2011-12-04 DIAGNOSIS — I6350 Cerebral infarction due to unspecified occlusion or stenosis of unspecified cerebral artery: Secondary | ICD-10-CM | POA: Diagnosis not present

## 2011-12-04 DIAGNOSIS — J9819 Other pulmonary collapse: Secondary | ICD-10-CM | POA: Diagnosis not present

## 2011-12-04 DIAGNOSIS — R059 Cough, unspecified: Secondary | ICD-10-CM | POA: Diagnosis not present

## 2011-12-04 DIAGNOSIS — R079 Chest pain, unspecified: Secondary | ICD-10-CM

## 2011-12-04 DIAGNOSIS — I517 Cardiomegaly: Secondary | ICD-10-CM | POA: Insufficient documentation

## 2011-12-04 DIAGNOSIS — K219 Gastro-esophageal reflux disease without esophagitis: Secondary | ICD-10-CM

## 2011-12-04 DIAGNOSIS — M79609 Pain in unspecified limb: Secondary | ICD-10-CM

## 2011-12-04 DIAGNOSIS — Z8673 Personal history of transient ischemic attack (TIA), and cerebral infarction without residual deficits: Secondary | ICD-10-CM | POA: Diagnosis not present

## 2011-12-04 DIAGNOSIS — M79673 Pain in unspecified foot: Secondary | ICD-10-CM

## 2011-12-04 DIAGNOSIS — M329 Systemic lupus erythematosus, unspecified: Secondary | ICD-10-CM | POA: Diagnosis not present

## 2011-12-04 DIAGNOSIS — R05 Cough: Secondary | ICD-10-CM | POA: Insufficient documentation

## 2011-12-04 MED ORDER — DOXYCYCLINE HYCLATE 100 MG PO TABS: 100.0000 mg | ORAL_TABLET | Freq: Two times a day (BID) | ORAL | Status: AC

## 2011-12-04 MED ORDER — PSEUDOEPH-BROMPHEN-DM 30-2-10 MG/5ML PO SYRP: 2.5000 mL | ORAL_SOLUTION | Freq: Four times a day (QID) | ORAL | Status: AC | PRN

## 2011-12-04 MED ORDER — HYDROCODONE-ACETAMINOPHEN 5-500 MG PO TABS: 1.0000 | ORAL_TABLET | Freq: Every evening | ORAL | Status: DC | PRN

## 2011-12-04 NOTE — Assessment & Plan Note (Signed)
It is well controlled. Today blood pressure is 107/70. Will continue current regimen.

## 2011-12-04 NOTE — Progress Notes (Signed)
Patient ID: Wanda [name], female   DOB: [birth year], 56 y.o.   MRN: [id number]  Subjective:   Patient ID: Wanda [name] female   DOB: [birth year] 56 y.o.   MRN: [id number]  HPI: Wanda [name] is a 56 y.o. with a past medical history as outlined below, who presents for acute visit today.  Patient has aphasia. She is accompanied by her goddaughter who translats what patient wants to say. Patient reports that she started having cough, runny nose, shortness of breath, chest pain since 2 weeks ago. Her chest pain is located at mid chest, 4/10 in severity, dull and non-radiating. It is aggravated by coughing. It is not aggravated by exertion. Patient doesn't have a fever, but endorses chills. She coughs up yellow-colored sputum without blood in it. Sometimes her cough is very severe which caused her to vomit. She did not seek any treatment in the past 2 weeks. She reports that her problem is slightly better since started.  Denies abdominal pain, diarrhea, constipation, dysuria, urgency, frequency, hematuria, joint pain or leg swelling.    Past Medical History  Diagnosis Date  . Anemia     iron deficiency, negative colonoscopy 2002 and EGD, Dr. Loreta Ave  . Depression   . GERD (gastroesophageal reflux disease)   . Hypertension   . 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 Stroke     ruptured berry aneurysm, right hemiparesis and expressive aphasia  . SLE (systemic lupus erythematosus)   . Venous insufficiency (chronic) (peripheral)   . Osteopenia    Current Outpatient Prescriptions  Medication Sig Dispense Refill  . albuterol (PROVENTIL) (5 MG/ML) 0.5% nebulizer solution Take 0.5 mLs (2.5 mg total) by nebulization every 4 (four) hours as needed. For shortness of breath  20 mL  3  . alendronate (FOSAMAX) 10 MG tablet Take 10 mg by mouth daily before breakfast. Take with a full glass of water on an empty stomach.       Marland Kitchen amLODipine (NORVASC) 10 MG tablet Take 0.5 tablets (5 mg  total) by mouth daily.  30 tablet  3  . aspirin 81 MG tablet Take 81 mg by mouth daily.        . brompheniramine-pseudoephedrine-DM 30-2-10 MG/5ML syrup Take 2.5 mLs by mouth 4 (four) times daily as needed.  120 mL  1  . calcium-vitamin D (OSCAL) 250-125 MG-UNIT per tablet Take 1 tablet by mouth daily.        Marland Kitchen doxycycline (VIBRA-TABS) 100 MG tablet Take 1 tablet (100 mg total) by mouth 2 (two) times daily.  20 tablet  0  . gabapentin (NEURONTIN) 300 MG capsule Take 300 mg by mouth 3 (three) times daily.      . hydrochlorothiazide (HYDRODIURIL) 25 MG tablet Take 25 mg by mouth daily.      Marland Kitchen HYDROcodone-acetaminophen (VICODIN) 5-500 MG per tablet Take 1 tablet by mouth at bedtime as needed. For pain      . HYDROcodone-acetaminophen (VICODIN) 5-500 MG per tablet Take 1 tablet by mouth at bedtime as needed for pain.  30 tablet  0  . hydroxychloroquine (PLAQUENIL) 200 MG tablet Take 200 mg by mouth 2 (two) times daily.      . Incontinence Supply Disposable (PROTECTIVE UNDERWEAR MEDIUM) MISC by Does not apply route.        . levETIRAcetam (KEPPRA) 500 MG tablet Take 500 mg by mouth every 12 (twelve) hours.      Marland Kitchen lisinopril (PRINIVIL,ZESTRIL) 10 MG tablet Take  10 mg by mouth daily.      Marland Kitchen loratadine (CLARITIN) 10 MG tablet Take 10 mg by mouth daily.        . metoCLOPramide (REGLAN) 5 MG tablet Take 5 mg by mouth 4 (four) times daily.      . metoprolol (LOPRESSOR) 50 MG tablet Take 50 mg by mouth 2 (two) times daily.      Marland Kitchen omeprazole (PRILOSEC) 20 MG capsule Take 20 mg by mouth daily.      . promethazine (PHENERGAN) 12.5 MG tablet Take 1 tablet (12.5 mg total) by mouth every 8 (eight) hours as needed for nausea.  15 tablet  0  . venlafaxine XR (EFFEXOR-XR) 75 MG 24 hr capsule Take 75 mg by mouth daily.      Marland Kitchen zolpidem (AMBIEN) 5 MG tablet Take 5 mg by mouth at bedtime as needed. For insomnia       No family history on file. History   Social History  . Marital Status: Widowed    Spouse Name: N/A      Number of Children: N/A  . Years of Education: N/A   Social History Main Topics  . Smoking status: Former Smoker -- 1.5 packs/day    Types: Cigarettes    Quit date: 05/30/2003  . Smokeless tobacco: Never Used  . Alcohol Use: No  . Drug Use: No  . Sexually Active: None   Other Topics Concern  . None   Social History Narrative   Has a home nurse, Lives with her 2 sons, Former smokerWidow   Review of Systems: ROS   General: no fevers, chills, no changes in body weight, no changes in appetite Skin: no rash HEENT: no blurry vision, hearing changes. Has sore throat Pulm: has cough, SOB,  CV: has chest pain. No palpitation. Abd: no nausea, abdominal pain, diarrhea/constipation GU: no dysuria, hematuria, polyuria Ext: has foot pain. Neuro: has right side weakness.    Objective:  Physical Exam: Filed Vitals:   12/04/11 1352  BP: 107/70  Pulse: 85  Temp: 97.6 F (36.4 C)  TempSrc: Oral  Height: 5\' 11"  (1.803 m)  Weight: 192 lb 4.8 oz (87.227 kg)   General: resting in bed, not in acute distress HEENT: PERRL, EOMI, no scleral icterus Cardiac: S1/S2, RRR, No murmurs, gallops or rubs Pulm: decreased air movement at the left lower lobe. Increased respiratory sound on the right side. No rales, wheezing, rhonchi or rubs. Abd: Soft,  nondistended, nontender, no rebound pain, no organomegaly, BS present Ext: No rashes or edema, 2+DP/PT pulse bilaterally Musculoskeletal: No joint deformities, erythema, or stiffness, ROM full and no nontender Skin: no rashes. No skin bruise. Neuro: alert and oriented X3, cranial nerves II-XII grossly intact, muscle strength 5/5 in left extremeties, 3/5 in right lower extremity and 0/5 in the right upper extremity. sensation to light touch intact on the left and decreased on the right. Psych.: patient is not psychotic, no suicidal or hemocidal ideation.   Assessment & Plan:

## 2011-12-04 NOTE — Assessment & Plan Note (Signed)
Patient's cough and chest pain are most likely caused by acute bronchitis. It can be either bacterial or viral infection. Her auscultation showed significantly decreased air movement on the left side and increased respiratory sounds on the right side. Chest x-ray showed marked elevation of left diaphragm which has no significant change compared with previous X-ray. Will treat her empirically with doxycycline for 10 days. We'll treat her cough with brompheniramine-pseudoephedrine.

## 2011-12-04 NOTE — Assessment & Plan Note (Signed)
It is well controlled with Prilosec. There is no new issue. We'll continue the current regimen.

## 2011-12-07 NOTE — Assessment & Plan Note (Addendum)
Patient has marked elevation of left diaphragm for many years which was shown on previous chest X-ray since 2002. There is no significant change recently. The etiology seems not to be clarified in the past. After her acute SOB resolves, patient may need to be referred to thoracic surgeon for further evaluation.

## 2011-12-15 ENCOUNTER — Other Ambulatory Visit: Payer: Self-pay | Admitting: Internal Medicine

## 2011-12-18 ENCOUNTER — Ambulatory Visit (INDEPENDENT_AMBULATORY_CARE_PROVIDER_SITE_OTHER): Payer: Medicare Other | Admitting: Internal Medicine

## 2011-12-18 ENCOUNTER — Encounter: Payer: Self-pay | Admitting: Internal Medicine

## 2011-12-18 VITALS — BP 106/69 | HR 78 | Temp 97.1°F | Wt 197.0 lb

## 2011-12-18 DIAGNOSIS — M940 Chondrocostal junction syndrome [Tietze]: Secondary | ICD-10-CM

## 2011-12-18 MED ORDER — CAPSAICIN-MENTHOL-METHYL SAL 0.025-1-12 % EX CREA: 1.0000 "application " | TOPICAL_CREAM | Freq: Three times a day (TID) | CUTANEOUS | Status: DC

## 2011-12-18 NOTE — Patient Instructions (Addendum)
1. Will give you  capsaicin cream three times daily 2. May take Ibuprofen 200 mg po every 6 hours as need with food as well 3. Follow up in 2 weeks if your pain is not well controlled.   Costochondritis Costochondritis (Tietze syndrome), or costochondral separation, is a swelling and irritation (inflammation) of the tissue (cartilage) that connects your ribs with your breastbone (sternum). It may occur on its own (spontaneously), through damage caused by an accident (trauma), or simply from coughing or minor exercise. It may take up to 6 weeks to get better and longer if you are unable to be conservative in your activities. HOME CARE INSTRUCTIONS   Avoid exhausting physical activity. Try not to strain your ribs during normal activity. This would include any activities using chest, belly (abdominal), and side muscles, especially if heavy weights are used.   Use ice for 15 to 20 minutes per hour while awake for the first 2 days. Place the ice in a plastic bag, and place a towel between the bag of ice and your skin.   Only take over-the-counter or prescription medicines for pain, discomfort, or fever as directed by your caregiver.  SEEK IMMEDIATE MEDICAL CARE IF:   Your pain increases or you are very uncomfortable.   You have a fever.   You develop difficulty with your breathing.   You cough up blood.   You develop worse chest pains, shortness of breath, sweating, or vomiting.   You develop new, unexplained problems (symptoms).  MAKE SURE YOU:   Understand these instructions.   Will watch your condition.   Will get help right away if you are not doing well or get worse.  Document Released: 02/11/2005 Document Revised: 04/23/2011 Document Reviewed: 12/21/2007 [facility] Patient Information 2012 [place], [place].

## 2011-12-18 NOTE — Progress Notes (Signed)
Subjective:   Patient ID: Wanda [name] female   DOB: 1955-12-05 56 y.o.   MRN: [id number]  HPI: [name] is a 56 y.o. with PMH of recent bronchitis, anemia, depression, HTN, SLE, stroke with expressive aphagia who presents to the clinic for follow up visit after her recent bronchitis.   Patient was treated with Doxycycline x 10 days after office visit on 12/04/11 for acute bronchitis.  She states that she is much better and does not have any symptoms except for right sided chest pain. Her chest pain is sharp pain, mild to moderate pain (unable to rate her pain level), no radiation, postural changes aggravate her chest pain and sitting still alleviates her pain. It is unclear how long her chest pain will last since she is not able to express herself well. Denies chest pressure, palpitation, SOB, cough, DOE, nausea, vomiting and abdominal pain, diarrhea, constipation, dysuria, urgency, frequency, hematuria, joint pain or leg swelling. She did not take any medications for her chest pain. She is here for evaluation.   Of note, patient is noted to have elevated left Diaphragm for years(please refer to the office visit on 12/07/11). Patient will need further evaluation once her acute problems are resolved.   Past Medical History  Diagnosis Date  . Anemia     iron deficiency, negative colonoscopy 2002 and EGD, Dr. Loreta Ave  . Depression   . GERD (gastroesophageal reflux disease)   . Hypertension   . 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 Stroke     ruptured berry aneurysm, right hemiparesis and expressive aphasia  . SLE (systemic lupus erythematosus)   . Venous insufficiency (chronic) (peripheral)   . Osteopenia    Current Outpatient Prescriptions  Medication Sig Dispense Refill  . albuterol (PROVENTIL) (5 MG/ML) 0.5% nebulizer solution Take 0.5 mLs (2.5 mg total) by nebulization every 4 (four) hours as needed. For shortness of breath  20 mL  3  . alendronate (FOSAMAX) 10  MG tablet Take 10 mg by mouth daily before breakfast. Take with a full glass of water on an empty stomach.       Marland Kitchen amLODipine (NORVASC) 10 MG tablet Take 0.5 tablets (5 mg total) by mouth daily.  30 tablet  3  . aspirin 81 MG tablet Take 81 mg by mouth daily.        . calcium-vitamin D (OSCAL) 250-125 MG-UNIT per tablet Take 1 tablet by mouth daily.        Marland Kitchen doxycycline (VIBRA-TABS) 100 MG tablet Take 1 tablet (100 mg total) by mouth 2 (two) times daily.  20 tablet  0  . gabapentin (NEURONTIN) 300 MG capsule Take 300 mg by mouth 3 (three) times daily.      . hydrochlorothiazide (HYDRODIURIL) 25 MG tablet Take 25 mg by mouth daily.      Marland Kitchen HYDROcodone-acetaminophen (VICODIN) 5-500 MG per tablet Take 1 tablet by mouth at bedtime as needed. For pain      . HYDROcodone-acetaminophen (VICODIN) 5-500 MG per tablet Take 1 tablet by mouth at bedtime as needed for pain.  30 tablet  0  . hydroxychloroquine (PLAQUENIL) 200 MG tablet Take 200 mg by mouth 2 (two) times daily.      . Incontinence Supply Disposable (PROTECTIVE UNDERWEAR MEDIUM) MISC by Does not apply route.        . levETIRAcetam (KEPPRA) 500 MG tablet TAKE ONE TABLET TWICE DAILY  Generic for KEPPRA 5  60 tablet  3  . lisinopril (PRINIVIL,ZESTRIL) 10 MG tablet Take 10 mg by mouth daily.      Marland Kitchen loratadine (CLARITIN) 10 MG tablet Take 10 mg by mouth daily.        . metoCLOPramide (REGLAN) 5 MG tablet Take 5 mg by mouth 4 (four) times daily.      . metoprolol (LOPRESSOR) 50 MG tablet Take 50 mg by mouth 2 (two) times daily.      Marland Kitchen omeprazole (PRILOSEC) 20 MG capsule Take 20 mg by mouth daily.      . promethazine (PHENERGAN) 12.5 MG tablet Take 12.5 mg by mouth every 8 (eight) hours as needed.      . venlafaxine XR (EFFEXOR-XR) 75 MG 24 hr capsule Take 75 mg by mouth daily.      Marland Kitchen zolpidem (AMBIEN) 5 MG tablet Take 5 mg by mouth at bedtime as needed. For insomnia       No family history on  file. History   Social History  . Marital Status: Widowed    Spouse Name: N/A    Number of Children: N/A  . Years of Education: N/A   Social History Main Topics  . Smoking status: Former Smoker -- 1.5 packs/day    Types: Cigarettes    Quit date: 05/30/2003  . Smokeless tobacco: Never Used  . Alcohol Use: No  . Drug Use: No  . Sexually Active: None   Other Topics Concern  . 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  No rales, wheezing, rhonchi or rubs noted B/L. B/L anterial chest wall tenderness R>L.  Abd: Soft,  nondistended, nontender, no rebound pain, no organomegaly, BS present Ext: No rashes or edema, 2+DP/PT pulse bilaterally Musculoskeletal: No joint deformities, erythema, or stiffness, ROM full and no nontender Skin: no rashes. No skin bruise. Neuro: alert and oriented X3, cranial nerves II-XII grossly intact, muscle strength 5/5 in left extremeties, 3/5 in right lower extremity and 0/5 in the right upper extremity. sensation to light touch intact on the left and decreased on the right. Psych.: patient is not psychotic, no suicidal or hemocidal ideation.   Assessment & Plan:

## 2011-12-19 NOTE — Assessment & Plan Note (Signed)
The clinical manifestation is consistent with the Diagnosis.  Given her disabling residual weakness, aphagia from her previous stroke, I would like to try topical medication first.  Will give her Capsaicin cream.  - patient is instructed to take low dose of Ibuprofen if above cream can not relieve her pain completely.

## 2011-12-23 ENCOUNTER — Other Ambulatory Visit: Payer: Self-pay | Admitting: Internal Medicine

## 2011-12-28 ENCOUNTER — Encounter: Payer: Self-pay | Admitting: Internal Medicine

## 2012-01-01 ENCOUNTER — Ambulatory Visit (HOSPITAL_COMMUNITY)
Admission: RE | Admit: 2012-01-01 | Discharge: 2012-01-01 | Disposition: A | Discharge status: Home / Self Care | Payer: Medicare Other | Source: Ambulatory Visit | Attending: Internal Medicine | Admitting: Internal Medicine

## 2012-01-01 ENCOUNTER — Ambulatory Visit (INDEPENDENT_AMBULATORY_CARE_PROVIDER_SITE_OTHER): Payer: Medicare Other | Admitting: Internal Medicine

## 2012-01-01 ENCOUNTER — Encounter: Payer: Self-pay | Admitting: Internal Medicine

## 2012-01-01 VITALS — BP 112/79 | HR 74 | Temp 97.1°F | Wt 194.5 lb

## 2012-01-01 DIAGNOSIS — R079 Chest pain, unspecified: Secondary | ICD-10-CM | POA: Insufficient documentation

## 2012-01-01 DIAGNOSIS — M329 Systemic lupus erythematosus, unspecified: Secondary | ICD-10-CM

## 2012-01-01 DIAGNOSIS — R351 Nocturia: Secondary | ICD-10-CM

## 2012-01-01 LAB — BASIC METABOLIC PANEL WITH GFR
CO2: 28 mEq/L (ref 19–32)
Calcium: 9.9 mg/dL (ref 8.4–10.5)
GFR, Est African American: >89 mL/min
Glucose, Bld: 86 mg/dL (ref 70–99)
Potassium: 4.3 mEq/L (ref 3.5–5.3)
Sodium: 134 mEq/L — ABNORMAL LOW (ref 135–145)

## 2012-01-01 MED ORDER — IBUPROFEN 400 MG PO TABS: 400.0000 mg | ORAL_TABLET | Freq: Four times a day (QID) | ORAL | Status: AC | PRN

## 2012-01-01 NOTE — Patient Instructions (Addendum)
1. Please take ibuprofen 400 mg po every 6 hours as needed for your costochondritis 2. Will obtain UA today. 3. Follow up in 2 months

## 2012-01-02 LAB — URINALYSIS, ROUTINE W REFLEX MICROSCOPIC
Bilirubin Urine: NEGATIVE
Glucose, UA: NEGATIVE mg/dL
Leukocytes, UA: NEGATIVE
Protein, ur: NEGATIVE mg/dL
Specific Gravity, Urine: 1.019 (ref 1.005–1.030)
Urobilinogen, UA: 1 mg/dL (ref 0.0–1.0)

## 2012-01-04 NOTE — Assessment & Plan Note (Signed)
Chest pain rule out.    The patient is a 56 year old woman who presented with atypical chest pain for couple months.  Reproducible anterior chest wall tenderness is noted on exam.  Her EKG is unremarkable for ischemic event.  The differential diagnosis include musculoskeletal pain/GERD/pericarditis/aortic dissection/PNA/PE.   She has musculoskeletal chest pain is positive tenderness anterior chest wall palpation.  She was given capsaicin cream and ibuprofen without much improvement.   She is already on PPI for GERD treatment.  So less likely GERD.  With regards pericarditis, given her history of pericardial effusion and pericarditis associated with lupus in 2002, she may be at higher risk to develop recurrent pericarditis. However, she does not have pericardial rub nor s/s of tamponade on exam. No indicates of pericarditis changes on her EKG.   For aortic dissection>>> the clinical manifestations is not consistent with aortic dissection. Her chest pain lasted for 1-2 months. She is hemodynamically stable.  Her chest x-ray shows no indication of medial sternal wideness. The aortic dissection is less likely.  For pneumonia>>> no respiratory symptoms, lung examination unremarkable, No opacity on CXR. PNA less likely.   For PE>>> no tachycardia or tachypnea, denies long-distance travel, no s/s of DVT, Geneva score 3 = low probability.   Plan:  - will evaluate for pericarditis given her history of pericardial effusion associated with Lupus.    1. EKG negative for pericarditis    2. Will order 2 D echo - evaluate chronic elevation Left diaphragm since 2002   However, patient has had changes of elevation of left diaphragm since 2002. So unsure whether it is the etiology of her chest pain. May consider further workup if her 2 D echo unrevealing.

## 2012-01-04 NOTE — Assessment & Plan Note (Signed)
Patient reports Nocturia in the setting of nighttime drinking.  No other discomfort.  - instruct patient not to drink any fluid 2-3 hours before bedtime - will obtain UA today.

## 2012-01-04 NOTE — Progress Notes (Signed)
Subjective:   Patient ID: [name] female   DOB: Feb 18, 1956 56 y.o.   MRN: [id number]  HPI: [name] is a 56 y.o. with PMH of recent bronchitis, anemia, depression, HTN, SLE, stroke with expressive aphagia who presents to the clinic for follow up visit.  Patient has been complaining of intermittent right sided chest pain x 1-2 months. Her chest pain is sharp pain, mild to moderate pain (unable to rate her pain level), no radiation, postural changes aggravate her chest pain and sitting still alleviates her pain. It is unclear how long her chest pain will last since she is not able to express herself well. Denies chest pressure, palpitation, SOB, cough, DOE, nausea, vomiting and abdominal pain, diarrhea, constipation, dysuria, urgency, frequency, hematuria, joint pain or leg swelling.   She was noted to have reproducible chest pain with anterior chest palpation. And was given Capaicin cream and Ibuprofen PRN. Her daughter reports that her chest pain is not better. She is here for reevaluation.   Of note, patient is noted to have elevated left Diaphragm for years (? Since 2002). Pre chart review, she has had admission of large pericardial effusion associated with lupus in 2002.  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     Patient daughter also reports increased nighttime urination for months without any other discomfort. She reports that patient usually drinks fluid before bedtime.    Past Medical History  Diagnosis Date  . Anemia     iron deficiency, negative colonoscopy 2002 and EGD, Dr. Loreta Ave  . Depression   . GERD (gastroesophageal reflux disease)   . Hypertension   . 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 Stroke     ruptured berry aneurysm, right hemiparesis and expressive aphasia  . SLE (systemic lupus erythematosus)   . Venous insufficiency  (chronic) (peripheral)   . Osteopenia    Current Outpatient Prescriptions  Medication Sig Dispense Refill  . albuterol (PROVENTIL) (5 MG/ML) 0.5% nebulizer solution Take 0.5 mLs (2.5 mg total) by nebulization every 4 (four) hours as needed. For shortness of breath  20 mL  3  . alendronate (FOSAMAX) 10 MG tablet Take 10 mg by mouth daily before breakfast. Take with a full glass of water on an empty stomach.       Marland Kitchen amLODipine (NORVASC) 10 MG tablet Take 0.5 tablets (5 mg total) by mouth daily.  30 tablet  3  . aspirin 81 MG tablet Take 81 mg by mouth daily.        . calcium-vitamin D (OSCAL) 250-125 MG-UNIT per tablet Take 1 tablet by mouth daily.        . Capsaicin-Menthol-Methyl Sal (CAPSAICIN-METHYL SAL-MENTHOL) 0.025-1-12 % CREA Apply 1 application topically 3 (three) times daily.  1 Tube  1  . gabapentin (NEURONTIN) 300 MG capsule Take 300 mg by mouth 3 (three) times daily.      . hydrochlorothiazide (HYDRODIURIL) 25 MG tablet Take 25 mg by mouth daily.      Marland Kitchen HYDROcodone-acetaminophen (VICODIN) 5-500 MG per tablet Take 1 tablet by mouth at bedtime as needed. For pain      . hydroxychloroquine (PLAQUENIL) 200 MG tablet TAKE ONE TABLET TWICE DAILY  GENERIC FOR PLAQUENI  60 tablet  0  . Incontinence Supply Disposable (PROTECTIVE UNDERWEAR MEDIUM) MISC by Does not apply route.        . levETIRAcetam (KEPPRA) 500 MG tablet TAKE ONE TABLET TWICE DAILY                                                  Generic for KEPPRA 5  60 tablet  3  . lisinopril (PRINIVIL,ZESTRIL) 10 MG tablet Take 10 mg by mouth daily.      Marland Kitchen loratadine (CLARITIN) 10 MG tablet Take 10 mg by mouth daily.        . metoCLOPramide (REGLAN) 5 MG tablet Take 5 mg by mouth 4 (four) times daily.      . metoprolol (LOPRESSOR) 50 MG tablet Take 50 mg by mouth 2 (two) times daily.      Marland Kitchen omeprazole (PRILOSEC) 20 MG capsule Take 20 mg by mouth daily.      . promethazine (PHENERGAN) 12.5 MG  tablet Take 12.5 mg by mouth every 8 (eight) hours as needed.      . venlafaxine XR (EFFEXOR-XR) 75 MG 24 hr capsule Take 75 mg by mouth daily.      Marland Kitchen zolpidem (AMBIEN) 5 MG tablet Take 5 mg by mouth at bedtime as needed. For insomnia      . HYDROcodone-acetaminophen (VICODIN) 5-500 MG per tablet Take 1 tablet by mouth at bedtime as needed for pain.  30 tablet  0  . ibuprofen (ADVIL,MOTRIN) 400 MG tablet Take 1 tablet (400 mg total) by mouth every 6 (six) hours as needed for pain.  30 tablet  0   Family History  Problem Relation Age of Onset  . Heart attack Brother 70   History   Social History  . Marital Status: Widowed    Spouse Name: N/A    Number of Children: N/A  . Years of Education: N/A   Social History Main Topics  . Smoking status: Former Smoker -- 1.5 packs/day    Types: Cigarettes    Quit date: 05/30/2003  . Smokeless tobacco: Never Used   Comment: she smoked for 30 years before she quit  . Alcohol Use: No  . Drug Use: No  . Sexually Active: None   Other Topics Concern  . 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  No rales, wheezing, rhonchi or rubs noted B/L. B/L anterial chest wall tenderness R>L.  Abd: Soft,  nondistended, nontender, no rebound pain, no organomegaly, BS present Ext: No rashes or edema, 2+DP/PT pulse bilaterally Musculoskeletal: No joint deformities, erythema, or stiffness, ROM full and no nontender Skin: no rashes. No skin bruise. Neuro: alert and  oriented X3, cranial nerves II-XII grossly intact, muscle strength 5/5 in left extremeties, 3/5 in right lower extremity and  0/5 in the right upper extremity. sensation to light touch intact on the left and decreased on the right. Psych.: patient is not psychotic, no suicidal or hemocidal ideation.   Assessment & Plan:

## 2012-01-20 ENCOUNTER — Other Ambulatory Visit: Payer: Self-pay | Admitting: *Deleted

## 2012-01-21 MED ORDER — HYDROXYCHLOROQUINE SULFATE 200 MG PO TABS: 200.0000 mg | ORAL_TABLET | Freq: Two times a day (BID) | ORAL | Status: DC

## 2012-01-27 ENCOUNTER — Encounter: Payer: Self-pay | Admitting: Internal Medicine

## 2012-02-02 ENCOUNTER — Ambulatory Visit (HOSPITAL_COMMUNITY): Payer: Medicare Other

## 2012-02-02 ENCOUNTER — Ambulatory Visit (HOSPITAL_COMMUNITY)
Admission: RE | Admit: 2012-02-02 | Discharge: 2012-02-02 | Disposition: A | Discharge status: Home / Self Care | Payer: Medicare Other | Source: Ambulatory Visit | Attending: Internal Medicine | Admitting: Internal Medicine

## 2012-02-02 DIAGNOSIS — I379 Nonrheumatic pulmonary valve disorder, unspecified: Secondary | ICD-10-CM | POA: Diagnosis not present

## 2012-02-02 DIAGNOSIS — R079 Chest pain, unspecified: Secondary | ICD-10-CM

## 2012-02-02 DIAGNOSIS — I080 Rheumatic disorders of both mitral and aortic valves: Secondary | ICD-10-CM | POA: Insufficient documentation

## 2012-02-02 DIAGNOSIS — I10 Essential (primary) hypertension: Secondary | ICD-10-CM | POA: Diagnosis not present

## 2012-02-02 DIAGNOSIS — R072 Precordial pain: Secondary | ICD-10-CM | POA: Diagnosis not present

## 2012-02-02 NOTE — Progress Notes (Signed)
*  PRELIMINARY RESULTS* Echocardiogram 2D Echocardiogram has been performed.  Wanda [name] 02/02/2012, 3:42 PM

## 2012-02-08 DIAGNOSIS — R5383 Other fatigue: Secondary | ICD-10-CM | POA: Diagnosis not present

## 2012-02-08 DIAGNOSIS — E559 Vitamin D deficiency, unspecified: Secondary | ICD-10-CM | POA: Diagnosis not present

## 2012-02-08 DIAGNOSIS — M810 Age-related osteoporosis without current pathological fracture: Secondary | ICD-10-CM | POA: Diagnosis not present

## 2012-03-11 DIAGNOSIS — M810 Age-related osteoporosis without current pathological fracture: Secondary | ICD-10-CM | POA: Diagnosis not present

## 2012-03-11 DIAGNOSIS — E559 Vitamin D deficiency, unspecified: Secondary | ICD-10-CM | POA: Diagnosis not present

## 2012-03-21 ENCOUNTER — Encounter: Payer: Self-pay | Admitting: Internal Medicine

## 2012-03-21 ENCOUNTER — Other Ambulatory Visit: Payer: Self-pay | Admitting: *Deleted

## 2012-03-21 MED ORDER — HYDROXYCHLOROQUINE SULFATE 200 MG PO TABS: 200.0000 mg | ORAL_TABLET | Freq: Two times a day (BID) | ORAL | Status: DC

## 2012-03-21 NOTE — Progress Notes (Signed)
Patient ID: Wanda [name], female   DOB: 07/02/1955, 56 y.o.   MRN: [id number]  This patient is a CHRONIC NO-SHOW PATIENT that has a history of HYPERTENSION.  Please make sure to address hypertension during her next clinic appointment, and intervene as appropriate.    Within the AVS, please incorporate the following smartphrase: .htntips   Pertinent Data: BP Readings from Last 3 Encounters:  01/01/12 112/79  12/18/11 106/69  12/04/11 107/70    BMI: Estimated Body mass index is 27.13 kg/(m^2) as calculated from the following:   Height as of 12/04/11: 5\' 11" (1.803 m).   Weight as of 01/01/12: 194 lb 8 oz(88.225 kg).  Smoking History: History  Smoking status  . Former Smoker -- 1.5 packs/day  . Types: Cigarettes  . Quit date: 05/30/2003  Smokeless tobacco  . Never Used    Comment: she smoked for 30 years before she quit    Last Basic Metabolic Panel:    Component Value Date/Time   NA 134* 01/01/2012 1600   K 4.3 01/01/2012 1600   CL 93* 01/01/2012 1600   CO2 28 01/01/2012 1600   BUN 11 01/01/2012 1600   CREATININE 0.71 01/01/2012 1600   CREATININE 0.74 10/05/2011 2229   GLUCOSE 86 01/01/2012 1600   CALCIUM 9.9 01/01/2012 1600    Allergies: Allergies  Allergen Reactions  . Penicillins Swelling

## 2012-03-21 NOTE — Telephone Encounter (Signed)
Pls cancel Dr Gordy Councilman [facility] in Dec with pt as pt is high no show. Pls sch in long OPC appt next 60 days with any OPC res. I gave enough med to last 60 days. Thanks!

## 2012-03-21 NOTE — Telephone Encounter (Signed)
Message sent to front desk to change pt's appt per Dr Donnelly Stager request.

## 2012-03-23 DIAGNOSIS — M25579 Pain in unspecified ankle and joints of unspecified foot: Secondary | ICD-10-CM | POA: Diagnosis not present

## 2012-04-13 ENCOUNTER — Other Ambulatory Visit: Payer: Self-pay | Admitting: Internal Medicine

## 2012-04-19 ENCOUNTER — Other Ambulatory Visit: Payer: Self-pay | Admitting: Internal Medicine

## 2012-04-28 ENCOUNTER — Encounter: Payer: Self-pay | Admitting: Internal Medicine

## 2012-04-28 ENCOUNTER — Encounter: Payer: Medicare Other | Admitting: Internal Medicine

## 2012-04-28 ENCOUNTER — Ambulatory Visit (INDEPENDENT_AMBULATORY_CARE_PROVIDER_SITE_OTHER): Payer: Medicare Other | Admitting: Internal Medicine

## 2012-04-28 VITALS — BP 100/64 | HR 85 | Temp 97.4°F

## 2012-04-28 DIAGNOSIS — I10 Essential (primary) hypertension: Secondary | ICD-10-CM | POA: Diagnosis not present

## 2012-04-28 DIAGNOSIS — M329 Systemic lupus erythematosus, unspecified: Secondary | ICD-10-CM | POA: Diagnosis not present

## 2012-04-28 DIAGNOSIS — J209 Acute bronchitis, unspecified: Secondary | ICD-10-CM

## 2012-04-28 MED ORDER — BENZONATATE 100 MG PO CAPS: 100.0000 mg | ORAL_CAPSULE | Freq: Three times a day (TID) | ORAL | Status: DC | PRN

## 2012-04-28 MED ORDER — HYDROCODONE-ACETAMINOPHEN 5-325 MG PO TABS: 1.0000 | ORAL_TABLET | Freq: Four times a day (QID) | ORAL | Status: DC | PRN

## 2012-04-28 MED ORDER — DOXYCYCLINE HYCLATE 100 MG PO TABS: 100.0000 mg | ORAL_TABLET | Freq: Two times a day (BID) | ORAL | Status: AC

## 2012-04-28 NOTE — Assessment & Plan Note (Signed)
BP Readings from Last 3 Encounters:  04/28/12 100/64  01/01/12 112/79  12/18/11 106/69    Lab Results  Component Value Date   NA 134* 01/01/2012   K 4.3 01/01/2012   CREATININE 0.71 01/01/2012    Assessment:  Blood pressure control: controlled  Progress toward BP goal:  at goal  Comments:   Plan:  Medications:  continue current medications  Educational resources provided:    Self management tools provided:    Other plans: continue lisinopril 10mg , metoprolol 50mg  bid, hctz 25mg , amlodipine 10 mg

## 2012-04-28 NOTE — Patient Instructions (Addendum)
We have put in a referral for a lupus specialist (Rheumatologist) We have prescribed tessalon pills for your cough. Continue to take your medications as prescribed.  LIFESTYLE TIPS TO HELP WITH YOUR BLOOD PRESSURE CONTROL  WEIGHT REDUCTION:  Strategies: A healthy weight loss program includes:  A calorie restricted diet based on individual calorie needs.   Increased physical activity (exercise).  An exercise program is just as important as the right low-calorie diet.    An unhealthy weight loss program includes:  Fasting.   Fad diets.   Supplements and drugs.  These choices do not succeed in long-term weight control.   Home Care Instructions: To help you make the needed dietary changes:   Exercise and perform physical activity as directed by your caregiver.   Keep a daily record of everything you eat. There are many free websites to help you with this. It may be helpful to measure your foods so you can determine if you are eating the correct portion sizes.   Use low-calorie cookbooks or take special cooking classes.   Avoid alcohol. Drink more water and drinks with no calories.   Take vitamins and supplements only as recommended by your caregiver.   Weight loss support groups, Registered Dieticians, counselors, and stress reduction education can also be very helpful.   ________________________________________________________________________  DASH DIET:  The DASH diet stands for "Dietary Approaches to Stop Hypertension." It is a healthy eating plan that has been shown to reduce high blood pressure (hypertension) in as little as 14 days, while also possibly providing other significant health benefits. These other health benefits include reducing the risk of breast cancer after menopause and reducing the risk of type 2 diabetes, heart disease, colon cancer, and stroke. Health benefits also include weight loss and slowing kidney failure in patients with chronic kidney  disease.   Diet guidelines: Limit salt (sodium). Your diet should contain less than 1500 mg of sodium daily.  Limit refined or processed carbohydrates. Your diet should include mostly whole grains. Desserts and added sugars should be used sparingly.  Include small amounts of heart-healthy fats. These types of fats include nuts, oils, and tub margarine. Limit saturated and trans fats. These fats have been shown to be harmful in the body.   Choosing Foods: The following food groups are based on a 2000 calorie diet. See your Registered Dietitian for individual calorie needs.  Grains and Grain Products (6 to 8 servings daily)  Eat More Often: Whole-wheat bread, brown rice, whole-grain or wheat pasta, quinoa, popcorn without added fat or salt (air popped).  Eat Less Often: White bread, white pasta, white rice, cornbread.  Vegetables (4 to 5 servings daily)  Eat More Often: Fresh, frozen, and canned vegetables. Vegetables may be raw, steamed, roasted, or grilled with a minimal amount of fat.  Eat Less Often/Avoid: Creamed or fried vegetables. Vegetables in a cheese sauce.  Fruit (4 to 5 servings daily)  Eat More Often: All fresh, canned (in natural juice), or frozen fruits. Dried fruits without added sugar. One hundred percent fruit juice ( cup [237 mL] daily).  Eat Less Often: Dried fruits with added sugar. Canned fruit in light or heavy syrup.  [employer], Fish, and Poultry (2 servings or less daily. One serving is 3 to 4 oz [85-114 g]).  Eat More Often: Ninety percent or leaner ground beef, tenderloin, sirloin. Round cuts of beef, chicken breast, [place] breast. All fish. Grill, bake, or broil your meat. Nothing should be fried.  Eat Less  Often/Avoid: Fatty cuts of meat, [place], or chicken leg, thigh, or wing. Fried cuts of meat or fish.  Dairy (2 to 3 servings)  Eat More Often: Low-fat or fat-free milk, low-fat plain or light yogurt, reduced-fat or part-skim cheese.  Eat Less Often/Avoid: Milk  (whole, 2%, skim, or chocolate). Whole milk yogurt. Full-fat cheeses.  Nuts, Seeds, and Legumes (4 to 5 servings per week)  Eat More Often: All without added salt.  Eat Less Often/Avoid: Salted nuts and seeds, canned beans with added salt.  Fats and Sweets (limited)  Eat More Often: Vegetable oils, tub margarines without trans fats, sugar-free gelatin. Mayonnaise and salad dressings.  Eat Less Often/Avoid: Coconut oils, palm oils, butter, stick margarine, cream, half and half, cookies, candy, pie.   ________________________________________________________________________  Smoking Cessation Tips 1-800-QUIT-NOW  This document explains the best ways for you to quit smoking and new treatments to help. It lists new medicines that can double or triple your chances of quitting and quitting for good. It also considers ways to avoid relapses and concerns you may have about quitting, including weight gain.   Nicotine: A Powerful Addiction If you have tried to quit smoking, you know how hard it can be. It is hard because nicotine is a very addictive drug. For some people, it can be as addictive as heroin or cocaine. Usually, people make 2 or 3 tries, or more, before finally being able to quit. Each time you try to quit, you can learn about what helps and what hurts. Quitting takes hard work and a lot of effort, but you can quit smoking.   Quitting smoking is one of the most important things you will ever do You will live longer, feel better, and live better.  The impact on your body of quitting smoking is felt almost immediately:   Five keys to quitting: Studies have shown that these 5 steps will help you quit smoking and quit for good. You have the best chances of quitting if you use them together:   1. GET READY  Set a quit date.  Change your environment.  Get rid of ALL cigarettes, ashtrays, matches, and lighters in your home, car, and place of work.  Do not let people smoke in your home.   Review your past attempts to quit. Think about what worked and what did not.  Once you quit, do not smoke. NOT EVEN A PUFF!   2. GET SUPPORT AND ENCOURAGEMENT  Tell your family, friends, and coworkers that you are going to quit and need their support. Ask them not to smoke around you.  Get individual, group, or telephone counseling and support.  Many smokers find one or more of the many self-help books available useful in helping them quit and stay off tobacco.   3. LEARN NEW SKILLS AND BEHAVIORS  Try to distract yourself from urges to smoke. Talk to someone, go for a walk, or occupy your time with a task.  When you first try to quit, change your routine. Take a different route to work. Drink tea instead of coffee. Eat breakfast in a different place.  Do something to reduce your stress. Take a hot bath, exercise, or read a book.  Plan something enjoyable to do every day. Reward yourself for not smoking.  Explore interactive web-based programs that specialize in helping you quit.   4. GET MEDICINE AND USE IT CORRECTLY .  Medicines can help you stop smoking and decrease the urge to smoke. Combining medicine with the  above behavioral methods and support can quadruple your chances of successfully quitting smoking.  Talk with your doctor about these options.  5. BE PREPARED FOR RELAPSE OR DIFFICULT SITUATIONS  Most relapses occur within the first 3 months after quitting. Do not be discouraged if you start smoking again. Remember, most people try several times before they finally quit.  You may have symptoms of withdrawal because your body is used to nicotine. You may crave cigarettes, be irritable, feel very hungry, cough often, get headaches, or have difficulty concentrating.  The withdrawal symptoms are only temporary. They are strongest when you first quit, but they will go away within 10 to 14 days.   Quitting takes hard work and a lot of effort, but you can quit smoking.   FOR MORE  INFORMATION  Smokefree.gov (http://www.davis-sullivan.com/) provides free, accurate, evidence-based information and professional assistance to help support the immediate and long-term needs of people trying to quit smoking.  Document Released: 04/28/2001 Document Re-Released: 10/22/2009  [facility] Patient Information 2011 [place], [place].

## 2012-04-28 NOTE — Progress Notes (Signed)
  Subjective:    Patient ID: Wanda [name], female    DOB: June 15, 1955, 56 y.o.   MRN: [id number]  HPI Pt presents to clinic with c/o productive cough for over a week. Her hx is significant for asthma, SLE, seizure d/o, htn, and stroke which has left her with hemiparesis and wheelchair bound.  She is present with her Home Health Aide and both state that she initially had cold-like symptoms with runny nose and sneezing which have subsided but she continues to have a cough.  Phlegm is yellow without blood streaks. She states that nebulized albuterol and cough syrup didn't help.  She has been without fever, chills, increased weakness or myalgias.  Denies shortness of breath but states that her sides are sore from coughing especially at night.   Review of Systems  Constitutional: Negative for fever, chills and diaphoresis.  HENT: Positive for rhinorrhea, sneezing and postnasal drip. Negative for congestion, sore throat and sinus pressure.   Respiratory: Positive for cough. Negative for shortness of breath and wheezing.   Cardiovascular: Negative for chest pain and palpitations.  Gastrointestinal: Negative for nausea, vomiting, diarrhea and constipation.  Musculoskeletal: Negative for myalgias.  Skin: Negative for rash.  Neurological: Negative for dizziness, light-headedness and headaches.       Objective:   Physical Exam  Constitutional: She is oriented to person, place, and time. She appears well-developed and well-nourished. No distress.       In wheelchair, aide present  HENT:       Chronic facial droop  Cardiovascular: Normal rate, regular rhythm and normal heart sounds.   Pulmonary/Chest: Effort normal and breath sounds normal. No respiratory distress. She has no wheezes. She has no rales. She exhibits no tenderness.       Shallow inspirations  Abdominal: Soft. Bowel sounds are normal.  Neurological: She is alert and oriented to person, place, and time.  Skin: Skin is warm and dry.   Psychiatric: She has a normal mood and affect.          Assessment & Plan:  1. Acute bronchitis:  secondary to URI, likely viral but would recommend course of doxycycline given setting of asthma and stroke with previous 30 yr smoking history to cover bacterial etiology -doxycycline 100 mg bid x 14 day -tessalon perles 100 mg bid prn cough  2. Htn: at goal, bp 100/64 85 bpm on lisinopril 10 mg qd, metoprolol 50 mg, hctz 25 mg qd, and amlodipine 10 mg qd  3. SLE: pt requesting referal to rheumatologist, states that she has not been to one in many years and would like her lupus evaluated.

## 2012-04-29 ENCOUNTER — Other Ambulatory Visit: Payer: Self-pay | Admitting: Internal Medicine

## 2012-05-16 ENCOUNTER — Telehealth: Payer: Self-pay | Admitting: *Deleted

## 2012-05-16 NOTE — Telephone Encounter (Signed)
Caregiver informed and will bring pt to ED

## 2012-05-16 NOTE — Telephone Encounter (Signed)
Patient needs to be seen at Urgent care or ER.  Wanda [name]

## 2012-05-16 NOTE — Telephone Encounter (Signed)
Call from pt's caretaker stating pt has been vomiting since Christmas.  She is not able to keep anything down.   She is offered a clear liq. Diet for past 4 - 5 days and keeps small amounts of liquids down. Pt was seen in clinic on 12/12 for bronchitis and treated with doxy for 14 days. Pt still has cough, taking tessalon perles. Also taking Zofran. Pt is alert but sleepy. Mouth is moist.  Wheezing on and off - is using nebulizer.  No BM's noted.  We have no appointments available. Please advise # [phone]

## 2012-05-25 ENCOUNTER — Other Ambulatory Visit: Payer: Self-pay | Admitting: *Deleted

## 2012-05-27 ENCOUNTER — Other Ambulatory Visit: Payer: Self-pay | Admitting: Internal Medicine

## 2012-05-27 DIAGNOSIS — M329 Systemic lupus erythematosus, unspecified: Secondary | ICD-10-CM

## 2012-05-27 MED ORDER — HYDROXYCHLOROQUINE SULFATE 200 MG PO TABS: 200.0000 mg | ORAL_TABLET | Freq: Two times a day (BID) | ORAL | Status: DC

## 2012-06-01 ENCOUNTER — Telehealth: Payer: Self-pay | Admitting: *Deleted

## 2012-06-01 NOTE — Telephone Encounter (Signed)
Pt caregiver stopped by office stating pt has not been eating well since last visit in December.  She states pt was able to tolerate liquids but when eating solid food she would start coughing and then vomit.  Some times the emesis would just be saliva.  Pt has been vomiting fluid x 3 days and not keeping her medications down. Caregiver reports pt is alert, and afebrile but is weak.  Pt given an appointment for tomorrow at 8:45

## 2012-06-01 NOTE — Telephone Encounter (Signed)
Agree with appt Thanks 

## 2012-06-02 ENCOUNTER — Ambulatory Visit (HOSPITAL_COMMUNITY)
Admission: RE | Admit: 2012-06-02 | Discharge: 2012-06-02 | Disposition: A | Discharge status: Home / Self Care | Payer: Medicare Other | Source: Ambulatory Visit | Attending: Internal Medicine | Admitting: Internal Medicine

## 2012-06-02 ENCOUNTER — Encounter: Payer: Self-pay | Admitting: Gastroenterology

## 2012-06-02 ENCOUNTER — Ambulatory Visit (INDEPENDENT_AMBULATORY_CARE_PROVIDER_SITE_OTHER): Payer: Medicare Other | Admitting: Internal Medicine

## 2012-06-02 ENCOUNTER — Other Ambulatory Visit: Payer: Self-pay | Admitting: Internal Medicine

## 2012-06-02 ENCOUNTER — Encounter: Payer: Self-pay | Admitting: Internal Medicine

## 2012-06-02 VITALS — BP 113/73 | HR 60 | Temp 97.1°F

## 2012-06-02 DIAGNOSIS — R059 Cough, unspecified: Secondary | ICD-10-CM | POA: Diagnosis not present

## 2012-06-02 DIAGNOSIS — R1110 Vomiting, unspecified: Secondary | ICD-10-CM | POA: Diagnosis not present

## 2012-06-02 DIAGNOSIS — R112 Nausea with vomiting, unspecified: Secondary | ICD-10-CM | POA: Diagnosis not present

## 2012-06-02 DIAGNOSIS — R05 Cough: Secondary | ICD-10-CM | POA: Insufficient documentation

## 2012-06-02 LAB — URINALYSIS, ROUTINE W REFLEX MICROSCOPIC
Glucose, UA: NEGATIVE mg/dL
Nitrite: NEGATIVE
Specific Gravity, Urine: 1.011 (ref 1.005–1.030)
pH: 6 (ref 5.0–8.0)

## 2012-06-02 LAB — URINALYSIS, MICROSCOPIC ONLY

## 2012-06-02 MED ORDER — PROMETHAZINE HCL 25 MG RE SUPP: 25.0000 mg | Freq: Four times a day (QID) | RECTAL | Status: DC | PRN

## 2012-06-02 NOTE — Progress Notes (Signed)
Internal Medicine Clinic Visit    HPI:  IZZIE [name] is a 57 y.o. 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  Per the patient's caregiver, she has not been eating well since December and has started to be intolerant of solid food. She was seen in clinic on December 12, was diagnosed with bronchitis and given a course of doxycycline x14 days.  Vomiting 4-5 times per day, +nausea, not able to keep anything down, not even liquids. Vomiting up phlegm, yellow and green, mostly the food she just ate. Vomits about 10-15 minutes after eating. Vomits up pills as well. Has been taking omeprazole 20mg  QD and reglan 5mg  QID.  Fever and chills at night, for past 2 weeks. Felt a little better on the antibiotics but worsens after completed the course.  Urinating normal amount, sometimes dark but sometimes normal light yellow color. One BM in past 2 weeks per caregiver, but patient nods yes that she has had soft stools.   Denies chest pain, but caregiver states she has chest pain and belly pain.    Past Medical History  Diagnosis Date  . Depression   . GERD (gastroesophageal reflux disease)   . Hypertension   . 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 Stroke     ruptured berry aneurysm, right hemiparesis and expressive aphasia  . SLE (systemic lupus erythematosus)   . Venous insufficiency (chronic) (peripheral)   . Osteopenia   . ANEMIA-IRON DEFICIENCY 03/02/2006    H&H 8.5/25.9 5/09. On 5/13 12.8/39.7 with MCV 80.4.     Marland Kitchen CEREBROVASCULAR ACCIDENT, HX OF 03/02/2006    Ruptured berry aneurysm, right hemiparesis & expressive aphasia       Past Surgical History  Procedure Date  . Total abdominal hysterectomy 5/09    Benign cervix, uterine fibroids on bx  . Cerebral aneurysm repair     1990's  . Esophagogastroduodenoscopy 2001    Varices and esophagitis     ROS:  A  complete review of systems was otherwise negative, except as noted in the HPI.  Allergies: Penicillins  Medications: Current Outpatient Prescriptions  Medication Sig Dispense Refill  . albuterol (PROVENTIL) (5 MG/ML) 0.5% nebulizer solution Take 0.5 mLs (2.5 mg total) by nebulization every 4 (four) hours as needed. For shortness of breath  20 mL  3  . alendronate (FOSAMAX) 10 MG tablet Take 10 mg by mouth daily before breakfast. Take with a full glass of water on an empty stomach.       Marland Kitchen amLODipine (NORVASC) 10 MG tablet Take 0.5 tablets (5 mg total) by mouth daily.  30 tablet  3  . aspirin 81 MG tablet Take 81 mg by mouth daily.        . benzonatate (TESSALON) 100 MG capsule Take 1 capsule (100 mg total) by mouth 3 (three) times daily as needed for cough.  20 capsule  0  . calcium-vitamin D (OSCAL) 250-125 MG-UNIT per tablet Take 1 tablet by mouth daily.        . Capsaicin-Menthol-Methyl Sal (CAPSAICIN-METHYL SAL-MENTHOL) 0.025-1-12 % CREA Apply 1 application topically 3 (three) times daily.  1 Tube  1  . gabapentin (NEURONTIN) 300 MG capsule Take 300 mg by mouth 3 (three) times daily.      . hydrochlorothiazide (HYDRODIURIL) 25 MG tablet Take 25 mg by mouth daily.      Marland Kitchen  HYDROcodone-acetaminophen (NORCO/VICODIN) 5-325 MG per tablet Take 1 tablet by mouth every 6 (six) hours as needed for pain.  30 tablet  5  . hydroxychloroquine (PLAQUENIL) 200 MG tablet Take 1 tablet (200 mg total) by mouth 2 (two) times daily.  60 tablet  1  . Incontinence Supply Disposable (PROTECTIVE UNDERWEAR MEDIUM) MISC by Does not apply route.        . levETIRAcetam (KEPPRA) 500 MG tablet TAKE ONE TABLET TWICE DAILY  60 tablet  11  . lisinopril (PRINIVIL,ZESTRIL) 10 MG tablet Take 10 mg by mouth daily.      Marland Kitchen loratadine (CLARITIN) 10 MG tablet Take 10 mg by mouth daily.        . metoCLOPramide (REGLAN) 5 MG tablet Take 5 mg by mouth 4 (four) times daily.      . metoprolol (LOPRESSOR) 50 MG tablet Take 50 mg by  mouth 2 (two) times daily.      Marland Kitchen omeprazole (PRILOSEC) 20 MG capsule Take 20 mg by mouth daily.      . promethazine (PHENERGAN) 12.5 MG tablet Take 12.5 mg by mouth every 8 (eight) hours as needed.      . venlafaxine XR (EFFEXOR-XR) 75 MG 24 hr capsule Take 75 mg by mouth daily.      Marland Kitchen zolpidem (AMBIEN) 5 MG tablet Take 5 mg by mouth at bedtime as needed. For insomnia        History   Social History  . Marital Status: Widowed    Spouse Name: N/A    Number of Children: N/A  . Years of Education: N/A   Occupational History  . Not on file.   Social History Main Topics  . Smoking status: Former Smoker -- 1.5 packs/day    Types: Cigarettes    Quit date: 05/30/2003  . Smokeless tobacco: Never Used     Comment: she smoked for 30 years before she quit  . Alcohol Use: No  . Drug Use: No  . Sexually Active: Not on file   Other Topics Concern  . Not on file   Social History Narrative   Has a home nurse, Lives with her 2 sons, Former smokerWidow    family history includes Heart attack (age of onset:60) in her brother.  Physical Exam There were no vitals taken for this visit. General:  No acute distress, in wheelchair, minimally verbal HEENT:  PERRL, EOMI,  moist mucous membranes Cardiovascular:  Regular rate and rhythm, no murmurs, rubs or gallops Respiratory:  Clear to auscultation bilaterally, no wheezes, rales, or rhonchi Abdomen:  Soft, nondistended, tenderness in right upper quadrant, positive bowel sounds Extremities:  Warm and well-perfused, right-sided upper extremity contracture. No lower extremity edema Skin: Warm, dry, no rashes Neuro: Not anxious appearing, no depressed mood, normal affect  Labs: Lab Results  Component Value Date   CREATININE 0.71 01/01/2012   BUN 11 01/01/2012   NA 134* 01/01/2012   K 4.3 01/01/2012   CL 93* 01/01/2012   CO2 28 01/01/2012   Lab Results  Component Value Date   WBC 5.5 10/05/2011   HGB 12.8 10/05/2011   HCT 39.7 10/05/2011   MCV  80.4 10/05/2011   PLT 381 10/05/2011      Assessment and Plan:    FOLLOWUP: Wanda [name] will follow back up in our clinic in approximately 2-3 weeks.   Wanda [name] knows to call out clinic in the meantime with any questions or new issues.

## 2012-06-02 NOTE — Assessment & Plan Note (Signed)
Patient has had nausea and vomiting of unclear etiology for approximately 3 weeks now. Her caregiver states that she is unable to keep anything down including her medications. Patient is, however, making urine at normal amounts, and her skin turgor is normal. She does not appear dehydrated.  Patient is mostly nonverbal and is unable to give a consistent history. It is unclear whether she is experiencing oropharyngeal or esophageal dysphasia with rotation versus abdominal pain and bilious vomiting. She is unable to give a reliable history, I am inclined to do a detailed workup.  -Unsuccessful attempts to collect blood.  -Urinalysis: not indicative of UTI -Chest x-ray without any consolidation or edema, no acute findings -Patient successfully tolerated by mouth challenge of juice and snacks in the office today. She was observed for over an hour after eating. She did have some coughing about 20 minutes after eating. -Referred to GI for evaluation -Obtain abdominal ultrasound -Will give Phenergan suppositories as she is not able to keep down pills  -May want to consider a swallow evaluation

## 2012-06-02 NOTE — Patient Instructions (Addendum)
Please get abdominal ultrasound.  Please use suppository for vomiting as needed

## 2012-06-07 ENCOUNTER — Ambulatory Visit (HOSPITAL_COMMUNITY): Payer: Medicare Other

## 2012-06-15 ENCOUNTER — Other Ambulatory Visit: Payer: Self-pay | Admitting: Internal Medicine

## 2012-06-29 ENCOUNTER — Encounter: Payer: Self-pay | Admitting: Gastroenterology

## 2012-06-30 ENCOUNTER — Ambulatory Visit: Payer: Medicare Other | Admitting: Gastroenterology

## 2012-07-06 ENCOUNTER — Other Ambulatory Visit: Payer: Self-pay | Admitting: Internal Medicine

## 2012-07-20 ENCOUNTER — Other Ambulatory Visit: Payer: Self-pay | Admitting: Internal Medicine

## 2012-07-20 MED ORDER — HYDROXYCHLOROQUINE SULFATE 200 MG PO TABS: 200.0000 mg | ORAL_TABLET | Freq: Two times a day (BID) | ORAL | Status: DC

## 2012-07-23 ENCOUNTER — Other Ambulatory Visit: Payer: Self-pay | Admitting: Internal Medicine

## 2012-07-23 DIAGNOSIS — I10 Essential (primary) hypertension: Secondary | ICD-10-CM

## 2012-07-27 ENCOUNTER — Ambulatory Visit: Payer: Medicare Other | Admitting: Gastroenterology

## 2012-08-01 NOTE — Addendum Note (Signed)
Addended by: Neomia Dear on: 08/01/2012 07:02 PM   Modules accepted: Orders

## 2012-08-08 ENCOUNTER — Other Ambulatory Visit: Payer: Self-pay | Admitting: *Deleted

## 2012-08-08 DIAGNOSIS — I10 Essential (primary) hypertension: Secondary | ICD-10-CM

## 2012-08-09 MED ORDER — AMLODIPINE BESYLATE 10 MG PO TABS: 5.0000 mg | ORAL_TABLET | Freq: Every day | ORAL | Status: DC

## 2012-08-24 NOTE — Addendum Note (Signed)
Addended by: Neomia Dear on: 08/24/2012 06:43 PM   Modules accepted: Orders

## 2012-09-21 ENCOUNTER — Encounter: Payer: Self-pay | Admitting: Internal Medicine

## 2012-09-21 ENCOUNTER — Ambulatory Visit (INDEPENDENT_AMBULATORY_CARE_PROVIDER_SITE_OTHER): Payer: Medicare Other | Admitting: Internal Medicine

## 2012-09-21 VITALS — BP 113/77 | HR 78 | Temp 97.3°F

## 2012-09-21 DIAGNOSIS — I10 Essential (primary) hypertension: Secondary | ICD-10-CM

## 2012-09-21 DIAGNOSIS — M810 Age-related osteoporosis without current pathological fracture: Secondary | ICD-10-CM | POA: Diagnosis not present

## 2012-09-21 DIAGNOSIS — Z23 Encounter for immunization: Secondary | ICD-10-CM

## 2012-09-21 DIAGNOSIS — J309 Allergic rhinitis, unspecified: Secondary | ICD-10-CM | POA: Diagnosis not present

## 2012-09-21 DIAGNOSIS — M329 Systemic lupus erythematosus, unspecified: Secondary | ICD-10-CM

## 2012-09-21 DIAGNOSIS — D509 Iron deficiency anemia, unspecified: Secondary | ICD-10-CM | POA: Diagnosis not present

## 2012-09-21 DIAGNOSIS — J302 Other seasonal allergic rhinitis: Secondary | ICD-10-CM

## 2012-09-21 DIAGNOSIS — Z09 Encounter for follow-up examination after completed treatment for conditions other than malignant neoplasm: Secondary | ICD-10-CM | POA: Diagnosis not present

## 2012-09-21 LAB — CBC WITH DIFFERENTIAL/PLATELET
Basophils Absolute: 0 10*3/uL (ref 0.0–0.1)
Basophils Relative: 1 % (ref 0–1)
Eosinophils Relative: 3 % (ref 0–5)
HCT: 38.8 % (ref 36.0–46.0)
Hemoglobin: 12.4 g/dL (ref 12.0–15.0)
MCHC: 32 g/dL (ref 30.0–36.0)
MCV: 79.8 fL (ref 78.0–100.0)
Monocytes Absolute: 0.3 10*3/uL (ref 0.1–1.0)
Monocytes Relative: 6 % (ref 3–12)
RDW: 14.6 % (ref 11.5–15.5)

## 2012-09-21 MED ORDER — LORATADINE 10 MG PO TABS: 10.0000 mg | ORAL_TABLET | Freq: Every day | ORAL | Status: DC

## 2012-09-21 MED ORDER — MOMETASONE FUROATE 50 MCG/ACT NA SUSP
NASAL | Status: DC
Start: 2012-09-21 — End: 2012-09-21

## 2012-09-21 MED ORDER — MOMETASONE FUROATE 50 MCG/ACT NA SUSP: NASAL | Status: DC

## 2012-09-21 NOTE — Assessment & Plan Note (Signed)
Blood pressure is well controlled on current regimen with metoprolol 50 mg, lisinopril 10 mg hydrochlorothiazide 25 mg and amlodipine 5 mg daily.  BP Readings from Last 3 Encounters:  09/21/12 113/77  06/02/12 113/73  04/28/12 100/64

## 2012-09-21 NOTE — Assessment & Plan Note (Signed)
Will obtain a CBC today.

## 2012-09-21 NOTE — Progress Notes (Signed)
Subjective:   Patient ID: Wanda [name] female   DOB: [birth year] 57 y.o.   MRN: [id number]  HPI: [name] is a 57 y.o. female with past medical history significant as outlined below who presented to the clinic for regular followup. Patient's care take is present during this office visit who is providing most of the history. Patient was evaluated in Jan 2014 nausea and vomiting which now significantly is improved. He was noticed that patient was eating too past and too much and since this was controlled she had no further episodes of nausea or vomiting. She was also complaining in the past about joint pain. It is controlled on current opiate regimen especially since she is doing more physical therapy.  The patient's main concern today is that she is experiencing significant itchy eyes, runny nose and some congestion especially when she sits outside for long time. Denies any shortness of breath or chest pain, cough, fevers or chills.     Past Medical History  Diagnosis Date  . Depression   . GERD (gastroesophageal reflux disease)   . Hypertension   . 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 Stroke     ruptured berry aneurysm, right hemiparesis and expressive aphasia  . SLE (systemic lupus erythematosus)   . Venous insufficiency (chronic) (peripheral)   . Osteopenia   . ANEMIA-IRON DEFICIENCY 03/02/2006    H&H 8.5/25.9 5/09. On 5/13 12.8/39.7 with MCV 80.4.     Marland Kitchen CEREBROVASCULAR ACCIDENT, HX OF 03/02/2006    Ruptured berry aneurysm, right hemiparesis & expressive aphasia      Current Outpatient Prescriptions  Medication Sig Dispense Refill  . albuterol (PROVENTIL) (5 MG/ML) 0.5% nebulizer solution Take 0.5 mLs (2.5 mg total) by nebulization every 4 (four) hours as needed. For shortness of breath  20 mL  3  . alendronate (FOSAMAX) 10 MG tablet Take 10 mg by mouth daily before breakfast. Take with a full glass of water on an empty stomach.       Marland Kitchen amLODipine  (NORVASC) 10 MG tablet Take 0.5 tablets (5 mg total) by mouth daily.  30 tablet  3  . aspirin 81 MG tablet Take 81 mg by mouth daily.        . baclofen (LIORESAL) 10 MG tablet       . benzonatate (TESSALON) 100 MG capsule Take 1 capsule (100 mg total) by mouth 3 (three) times daily as needed for cough.  20 capsule  0  . calcium-vitamin D (OSCAL) 250-125 MG-UNIT per tablet Take 1 tablet by mouth daily.        . Capsaicin-Menthol-Methyl Sal (CAPSAICIN-METHYL SAL-MENTHOL) 0.025-1-12 % CREA Apply 1 application topically 3 (three) times daily.  1 Tube  1  . gabapentin (NEURONTIN) 300 MG capsule Take 300 mg by mouth 3 (three) times daily.      . hydrochlorothiazide (HYDRODIURIL) 25 MG tablet TAKE ONE (1) TABLET EACH DAY  30 tablet  6  . HYDROcodone-acetaminophen (NORCO/VICODIN) 5-325 MG per tablet Take 1 tablet by mouth every 6 (six) hours as needed for pain.  30 tablet  5  . hydroxychloroquine (PLAQUENIL) 200 MG tablet Take 1 tablet (200 mg total) by mouth 2 (two) times daily.  60 tablet  1  . Incontinence Supply Disposable (PROTECTIVE UNDERWEAR MEDIUM) MISC by Does not apply route.        . levETIRAcetam (KEPPRA) 500 MG tablet TAKE ONE TABLET TWICE DAILY  60 tablet  11  . lisinopril (  PRINIVIL,ZESTRIL) 10 MG tablet TAKE ONE (1) TABLET EACH DAY  90 tablet  3  . loratadine (CLARITIN) 10 MG tablet Take 10 mg by mouth daily.        . metoCLOPramide (REGLAN) 5 MG tablet TAKE ONE TABLET FOUR TIMES DAILY  120 tablet  6  . metoprolol (LOPRESSOR) 50 MG tablet TAKE ONE TABLET TWICE DAILY  180 tablet  3  . omeprazole (PRILOSEC) 20 MG capsule TAKE ONE (1) CAPSULE EACH DAY  31 capsule  11  . promethazine (PHENERGAN) 12.5 MG tablet Take 12.5 mg by mouth every 8 (eight) hours as needed.      . promethazine (PROMETHEGAN) 25 MG suppository Place 1 suppository (25 mg total) rectally every 6 (six) hours as needed for nausea.  12 each  0  . venlafaxine XR (EFFEXOR-XR) 75 MG 24 hr capsule TAKE ONE (1) CAPSULE EACH DAY  31  capsule  11  . zolpidem (AMBIEN) 5 MG tablet Take 5 mg by mouth at bedtime as needed. For insomnia       No current facility-administered medications for this visit.   Family History  Problem Relation Age of Onset  . Heart attack Brother 58   History   Social History  . Marital Status: Widowed    Spouse Name: N/A    Number of Children: N/A  . Years of Education: N/A   Social History Main Topics  . Smoking status: Former Smoker -- 1.50 packs/day    Types: Cigarettes    Quit date: 05/30/2003  . Smokeless tobacco: Never Used     Comment: she smoked for 30 years before she quit  . Alcohol Use: No  . Drug Use: No  . Sexually Active: None   Other Topics Concern  . None   Social History Narrative   Has a home nurse,    Lives with her 2 sons,    Former smoker   Widow         Review of Systems: Constitutional: Denies fever, chills, diaphoresis, appetite change and fatigue.  HEENT: Noted redness, congestion,  rhinorrhea, sneezing, mouth sores but denies trouble swallowing, neck pain, neck stiffness and tinnitus.   Respiratory: Denies SOB, DOE, cough, chest tightness,  and wheezing.   Cardiovascular: Denies chest pain, palpitations and leg swelling.  Gastrointestinal: Denies nausea, vomiting, abdominal pain, diarrhea, constipation, blood in stool and abdominal distention.  Genitourinary: Denies dysuria, urgency, frequency, hematuria, flank pain and difficulty urinating.   Skin: Denies pallor, rash and wound.  Neurological: Denies dizziness   Objective:  Physical Exam: Filed Vitals:   09/21/12 1020  BP: 113/77  Pulse: 78  Temp: 97.3 F (36.3 C)  TempSrc: Oral  SpO2: 97%   Constitutional: Vital signs reviewed.  Patient is a well-developed and well-nourished female in no acute distress and cooperative with exam. Alert and oriented x3.  Mouth: no erythema or exudates, MMM Eyes: PERRL, EOMI, conjunctivae normal, No scleral icterus.  Nose: boggy, erythematous nasal  muccosa present  Neck: Supple,   Cardiovascular: RRR, S1 normal, S2 normal, no MRG, pulses symmetric and intact bilaterally Pulmonary/Chest: CTAB, no wheezes, rales, or rhonchi Abdominal: Soft. Non-tender, non-distended, bowel sounds are normal,  Hematology: no cervical adenopathy.  Neurological: A&O x2

## 2012-09-21 NOTE — Assessment & Plan Note (Signed)
Patient's symptoms most consistently with seasonal allergies. I will start patient on loratadine 10 mg daily along with Nasonex spray.

## 2012-09-21 NOTE — Assessment & Plan Note (Signed)
Patient was given tetanus vaccination today

## 2012-09-21 NOTE — Assessment & Plan Note (Signed)
Patient is followup by Dr. Albertha Ghee ( Orthopedics) . She wanted to obtain a Cmet, CBC and vitamin D level which will obtain during this office visit. We will fax results to her office.

## 2012-09-22 LAB — COMPREHENSIVE METABOLIC PANEL
ALT: 11 U/L (ref 0–35)
AST: 11 U/L (ref 0–37)
Alkaline Phosphatase: 75 U/L (ref 39–117)
BUN: 16 mg/dL (ref 6–23)
Chloride: 97 mEq/L (ref 96–112)
Creat: 0.71 mg/dL (ref 0.50–1.10)
Total Bilirubin: 0.3 mg/dL (ref 0.3–1.2)

## 2012-09-22 NOTE — Progress Notes (Signed)
Case discussed with Dr. Illath immediately after the resident saw the patient. We reviewed the resident's history and exam and pertinent patient test results. I agree with the assessment, diagnosis and plan of care documented in the resident's note. 

## 2012-09-23 ENCOUNTER — Ambulatory Visit: Payer: Medicare Other | Admitting: Internal Medicine

## 2012-09-23 NOTE — Progress Notes (Signed)
Results of Labs drawn on 09/21/12 were faxed to Dr. Albertha Ghee on 09/22/12 at 9:30am by VBarrow,PBT.

## 2012-09-28 DIAGNOSIS — E559 Vitamin D deficiency, unspecified: Secondary | ICD-10-CM | POA: Diagnosis not present

## 2012-09-28 DIAGNOSIS — M810 Age-related osteoporosis without current pathological fracture: Secondary | ICD-10-CM | POA: Diagnosis not present

## 2012-11-24 ENCOUNTER — Encounter: Payer: Medicare Other | Admitting: Internal Medicine

## 2012-11-29 ENCOUNTER — Other Ambulatory Visit: Payer: Self-pay | Admitting: *Deleted

## 2012-11-29 NOTE — Telephone Encounter (Signed)
She no showed her last clinic visit. She needs to be seen before her narcotic pain medication can be refilled. Please schedule her for an appointment

## 2012-11-29 NOTE — Telephone Encounter (Signed)
Last refill 6/9 

## 2012-12-08 ENCOUNTER — Encounter: Payer: Self-pay | Admitting: Internal Medicine

## 2012-12-08 ENCOUNTER — Ambulatory Visit (INDEPENDENT_AMBULATORY_CARE_PROVIDER_SITE_OTHER): Payer: Medicare Other | Admitting: Internal Medicine

## 2012-12-08 VITALS — BP 112/73 | HR 81 | Temp 97.0°F

## 2012-12-08 DIAGNOSIS — Z0000 Encounter for general adult medical examination without abnormal findings: Secondary | ICD-10-CM | POA: Diagnosis not present

## 2012-12-08 DIAGNOSIS — J45909 Unspecified asthma, uncomplicated: Secondary | ICD-10-CM

## 2012-12-08 DIAGNOSIS — I10 Essential (primary) hypertension: Secondary | ICD-10-CM | POA: Diagnosis not present

## 2012-12-08 DIAGNOSIS — M810 Age-related osteoporosis without current pathological fracture: Secondary | ICD-10-CM

## 2012-12-08 DIAGNOSIS — J302 Other seasonal allergic rhinitis: Secondary | ICD-10-CM

## 2012-12-08 DIAGNOSIS — J309 Allergic rhinitis, unspecified: Secondary | ICD-10-CM

## 2012-12-08 DIAGNOSIS — G4700 Insomnia, unspecified: Secondary | ICD-10-CM

## 2012-12-08 DIAGNOSIS — R300 Dysuria: Secondary | ICD-10-CM

## 2012-12-08 MED ORDER — ZOLPIDEM TARTRATE 5 MG PO TABS: 5.0000 mg | ORAL_TABLET | Freq: Every evening | ORAL | Status: DC | PRN

## 2012-12-08 MED ORDER — GABAPENTIN 300 MG PO CAPS: 300.0000 mg | ORAL_CAPSULE | Freq: Three times a day (TID) | ORAL | Status: DC

## 2012-12-08 MED ORDER — LORATADINE 10 MG PO TABS: 10.0000 mg | ORAL_TABLET | Freq: Every day | ORAL | Status: DC

## 2012-12-08 MED ORDER — HYDROCODONE-ACETAMINOPHEN 5-325 MG PO TABS: 1.0000 | ORAL_TABLET | Freq: Four times a day (QID) | ORAL | Status: DC | PRN

## 2012-12-08 NOTE — Assessment & Plan Note (Signed)
She is due for a screening mammogram. Her last mammogram was in 07/03/10 and was normal. Ordered mammogram. - Screening mammogram

## 2012-12-08 NOTE — Assessment & Plan Note (Addendum)
She is currently on fosamax. C/o back pain and requesting refill of her Norco which controls her pain. - Norco 5-325 1 tablet q6h PRN pain, disp #30, 0 refills

## 2012-12-08 NOTE — Assessment & Plan Note (Signed)
Per pt and caregiver, her breathing is well controlled at the moment, but she needs a new nebulizer. Ordered a new DME nebulizer to be delivered to her home.  - DME nebulizer

## 2012-12-08 NOTE — Assessment & Plan Note (Addendum)
Sx well controlled. Requesting a refill of the loratadine.  - Loratadine10mg  po daily

## 2012-12-08 NOTE — Progress Notes (Signed)
Patient ID: Wanda [name], female   DOB: 1956-01-21, 57 y.o.   MRN: [id number]  Subjective:   Patient ID: Wanda [name] female   DOB: November 06, 1955 57 y.o.   MRN: [id number]  HPI: [name] is a 57 y.o. 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  She was seen in May with allergic rhinitis-like symptoms, but this appears better controlled today, and she is no longer requiring the loratadine and Nasonex.  She c/o intermittent right foot/leg swelling that has been chronic. She is supposed to be wearing compression stockings but is not b/c she finds them to be uncomfortable.   She is concerned that she has a UTI, and is c/o of dysuria, urgency, frequency, and foul smelling urine over the past few days.   Per the pt's care giver, [name] is not sleeping well at night. She has been on Ambien which does improve her ability to sleep, but she has been out of the medication recently and was told she needed a f/u appointment before it could be refilled.   Past Medical History  Diagnosis Date  . Depression   . GERD (gastroesophageal reflux disease)   . Hypertension   . 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 Stroke     ruptured berry aneurysm, right hemiparesis and expressive aphasia  . SLE (systemic lupus erythematosus)   . Venous insufficiency (chronic) (peripheral)   . Osteopenia   . ANEMIA-IRON DEFICIENCY 03/02/2006    H&H 8.5/25.9 5/09. On 5/13 12.8/39.7 with MCV 80.4.     Marland Kitchen CEREBROVASCULAR ACCIDENT, HX OF 03/02/2006    Ruptured berry aneurysm, right hemiparesis & expressive aphasia      Current Outpatient Prescriptions  Medication Sig Dispense Refill  . albuterol (PROVENTIL) (5 MG/ML) 0.5% nebulizer solution Take 0.5 mLs (2.5 mg total) by nebulization every 4 (four) hours as needed. For shortness of breath  20 mL  3  . alendronate (FOSAMAX) 10 MG tablet Take 10 mg by  mouth daily before breakfast. Take with a full glass of water on an empty stomach.       Marland Kitchen amLODipine (NORVASC) 10 MG tablet Take 0.5 tablets (5 mg total) by mouth daily.  30 tablet  3  . aspirin 81 MG tablet Take 81 mg by mouth daily.        . baclofen (LIORESAL) 10 MG tablet       . calcium-vitamin D (OSCAL) 250-125 MG-UNIT per tablet Take 1 tablet by mouth daily.        . Capsaicin-Menthol-Methyl Sal (CAPSAICIN-METHYL SAL-MENTHOL) 0.025-1-12 % CREA Apply 1 application topically 3 (three) times daily.  1 Tube  1  . gabapentin (NEURONTIN) 300 MG capsule Take 300 mg by mouth 3 (three) times daily.      . hydrochlorothiazide (HYDRODIURIL) 25 MG tablet TAKE ONE (1) TABLET EACH DAY  30 tablet  6  . HYDROcodone-acetaminophen (NORCO/VICODIN) 5-325 MG per tablet Take 1 tablet by mouth every 6 (six) hours as needed for pain.  30 tablet  5  . hydroxychloroquine (PLAQUENIL) 200 MG tablet Take 1 tablet (200 mg total) by mouth 2 (two) times daily.  60 tablet  1  . Incontinence Supply Disposable (PROTECTIVE UNDERWEAR MEDIUM) MISC by Does not apply route.        . levETIRAcetam (KEPPRA) 500 MG tablet TAKE ONE TABLET TWICE DAILY  60 tablet  11  .  lisinopril (PRINIVIL,ZESTRIL) 10 MG tablet TAKE ONE (1) TABLET EACH DAY  90 tablet  3  . loratadine (CLARITIN) 10 MG tablet Take 1 tablet (10 mg total) by mouth daily.  30 tablet  0  . metoCLOPramide (REGLAN) 5 MG tablet TAKE ONE TABLET FOUR TIMES DAILY  120 tablet  6  . metoprolol (LOPRESSOR) 50 MG tablet TAKE ONE TABLET TWICE DAILY  180 tablet  3  . mometasone (NASONEX) 50 MCG/ACT nasal spray 2 spray in each nostril daily in the morning  17 g  0  . omeprazole (PRILOSEC) 20 MG capsule TAKE ONE (1) CAPSULE EACH DAY  31 capsule  11  . promethazine (PHENERGAN) 12.5 MG tablet Take 12.5 mg by mouth every 8 (eight) hours as needed.      . promethazine (PROMETHEGAN) 25 MG suppository Place 1 suppository (25 mg total) rectally every 6 (six) hours as needed for nausea.  12  each  0  . venlafaxine XR (EFFEXOR-XR) 75 MG 24 hr capsule TAKE ONE (1) CAPSULE EACH DAY  31 capsule  11  . zolpidem (AMBIEN) 5 MG tablet Take 5 mg by mouth at bedtime as needed. For insomnia       No current facility-administered medications for this visit.   Family History  Problem Relation Age of Onset  . Heart attack Brother 60   History   Social History  . Marital Status: Widowed    Spouse Name: N/A    Number of Children: N/A  . Years of Education: N/A   Social History Main Topics  . Smoking status: Former Smoker -- 1.50 packs/day    Types: Cigarettes    Quit date: 05/30/2003  . Smokeless tobacco: Never Used     Comment: she smoked for 30 years before she quit  . Alcohol Use: No  . Drug Use: No  . Sexually Active: None   Other Topics Concern  . None   Social History Narrative   Has a home nurse,    Lives with her 2 sons,    Former smoker   Widow         Review of Systems: Constitutional: Denies fever, chills, diaphoresis, appetite change and fatigue.  HEENT: Denies ear pain, congestion, sore throat, rhinorrhea, sneezing  Respiratory: Denies SOB, DOE, cough, chest tightness,  and wheezing.   Cardiovascular: +RLE edema. Denies chest pain Gastrointestinal: Denies nausea, vomiting, abdominal pain, diarrhea, constipation, blood in stool and abdominal distention.  Genitourinary: + dysuria, urgency, frequency, and foul smelling urine. Endocrine: Denies: hot or cold intolerance, sweats, changes in hair or nails, polyuria, polydipsia. Musculoskeletal: +back pain and some pain with ambulation Skin: Denies pallor, rash and wound.  Neurological: Denies dizziness, seizures, syncope, weakness, light-headedness, numbness and headaches.  Psychiatric/Behavioral: Denies mood changes  Objective:  Physical Exam: Filed Vitals:   12/08/12 1444  BP: 112/73  Pulse: 81  Temp: 97 F (36.1 C)  TempSrc: Oral  SpO2: 95%   Constitutional: Vital signs reviewed.  Patient is a  well-developed and well-nourished female in no acute distress and cooperative with exam. Alert.  Head: Normocephalic and atraumatic Eyes: PERRL, EOMI, conjunctivae normal, No scleral icterus.  Cardiovascular: RRR, S1 normal, S2 normal, no MRG, pulses symmetric and intact bilaterally Pulmonary/Chest: normal respiratory effort, CTAB, no wheezes, rales, or rhonchi Abdominal: Soft. Non-tender, non-distended, bowel sounds are normal, no masses, organomegaly, or guarding present.  Musculoskeletal: 2+ pitting edema of RLE from mid shin, improving going into the ankle, +stiffness of the RUE which is abducted into  the chest. Difficulty moving RU&LE  Neurological: A&O x3, right hemiparesis, expressive aphagia Skin: Warm, dry and intact.  Psychiatric: Normal mood and affect.   Assessment & Plan:   Please refer to Problem List based Assessment and Plan

## 2012-12-08 NOTE — Assessment & Plan Note (Signed)
Dysuria, urgency, frequency, and cloudy, foul smelling urine over the past few days is concerning for a UTI.  - Checking UA and will treat accordingly.

## 2012-12-08 NOTE — Assessment & Plan Note (Signed)
BP Readings from Last 3 Encounters:  12/08/12 112/73  09/21/12 113/77  06/02/12 113/73    Lab Results  Component Value Date   NA 137 09/21/2012   K 3.9 09/21/2012   CREATININE 0.71 09/21/2012    Assessment: Blood pressure control: controlled Progress toward BP goal:  at goal   Plan: Medications:  continue current medications Educational resources provided: brochure;handout;video Self management tools provided:

## 2012-12-08 NOTE — Assessment & Plan Note (Signed)
Pt has been on Ambien 5mg  qhs but ran out. Increasing difficulty sleeping. Per pt and her caregiver, the Ambien does not cause increased lethargy during the day or oversedation. Will refill Ambien prescription, as she has already been taking this. - Ambien 5mg  po before bedtime.

## 2012-12-08 NOTE — Patient Instructions (Addendum)
**  If you do not hear from the clinic regarding your urine test, please call the clinic. **If you do not hear anything about the nebulizer, please call the clinic   Back Exercises Back exercises help treat and prevent back injuries. The goal of back exercises is to increase the strength of your abdominal and back muscles and the flexibility of your back. These exercises should be started when you no longer have back pain. Back exercises include:  Pelvic Tilt. Lie on your back with your knees bent. Tilt your pelvis until the lower part of your back is against the floor. Hold this position 5 to 10 sec and repeat 5 to 10 times.  Knee to Chest. Pull first 1 knee up against your chest and hold for 20 to 30 seconds, repeat this with the other knee, and then both knees. This may be done with the other leg straight or bent, whichever feels better.  Sit-Ups or Curl-Ups. Bend your knees 90 degrees. Start with tilting your pelvis, and do a partial, slow sit-up, lifting your trunk only 30 to 45 degrees off the floor. Take at least 2 to 3 seconds for each sit-up. Do not do sit-ups with your knees out straight. If partial sit-ups are difficult, simply do the above but with only tightening your abdominal muscles and holding it as directed.  Hip-Lift. Lie on your back with your knees flexed 90 degrees. Push down with your feet and shoulders as you raise your hips a couple inches off the floor; hold for 10 seconds, repeat 5 to 10 times.  Back arches. Lie on your stomach, propping yourself up on bent elbows. Slowly press on your hands, causing an arch in your low back. Repeat 3 to 5 times. Any initial stiffness and discomfort should lessen with repetition over time.  Shoulder-Lifts. Lie face down with arms beside your body. Keep hips and torso pressed to floor as you slowly lift your head and shoulders off the floor. Do not overdo your exercises, especially in the beginning. Exercises may cause you some mild back  discomfort which lasts for a few minutes; however, if the pain is more severe, or lasts for more than 15 minutes, do not continue exercises until you see your caregiver. Improvement with exercise therapy for back problems is slow.  See your caregivers for assistance with developing a proper back exercise program. Document Released: 06/11/2004 Document Revised: 07/27/2011 Document Reviewed: 03/05/2011 [facility] Patient Information 2014 [place], [place].

## 2012-12-09 ENCOUNTER — Other Ambulatory Visit: Payer: Self-pay | Admitting: *Deleted

## 2012-12-09 DIAGNOSIS — J45909 Unspecified asthma, uncomplicated: Secondary | ICD-10-CM

## 2012-12-09 LAB — URINALYSIS, ROUTINE W REFLEX MICROSCOPIC
Glucose, UA: NEGATIVE mg/dL
Hgb urine dipstick: NEGATIVE
Leukocytes, UA: NEGATIVE
Nitrite: NEGATIVE
Urobilinogen, UA: 1 mg/dL (ref 0.0–1.0)
pH: 6.5 (ref 5.0–8.0)

## 2012-12-09 NOTE — Progress Notes (Signed)
Rx for nebulizer placed in [facility] tray - - pt aware - past nebulizer are from [facility]. Stanton Kidney Nicolo Tomko RN 12/09/12 8:30AM

## 2012-12-12 ENCOUNTER — Telehealth: Payer: Self-pay | Admitting: *Deleted

## 2012-12-12 MED ORDER — ALBUTEROL SULFATE (5 MG/ML) 0.5% IN NEBU: 2.5000 mg | INHALATION_SOLUTION | RESPIRATORY_TRACT | Status: DC | PRN

## 2012-12-12 NOTE — Telephone Encounter (Signed)
Received call from pt's care giver. She reports pt c/o chest pain and breathing funny.  Onset yesterday. Not able to give any other information.  I called the Patient and she is not able to communicate over the phone, I spoke with her son and he denies that pt is having Chest Pain. He states she needed to use her inhaler. She was out of Albuterol. I checked and refill was done today.  I recommended ED for any Chest Pain.  Son voices understanding.

## 2012-12-15 NOTE — Progress Notes (Signed)
Case discussed with Dr. Sherrine Maples at the time of the visit.  We reviewed the resident's history and exam and pertinent patient test results.  I agree with the assessment, diagnosis, and plan of care documented in the resident's note.  For next visit, Dr, Sherrine Maples is advised that the patient be given an eye exam referral , both because she has diabetes and she is on hydroxychloroquine. Also, we need to document if her lupus is being managed by a rheumatologist, or [place]. A visit for medication management would be suitable for this patient, in which we need to determine the duration that she has been on bisphosphonates.

## 2012-12-23 ENCOUNTER — Ambulatory Visit (HOSPITAL_COMMUNITY): Payer: Medicare Other | Attending: Internal Medicine

## 2013-01-04 ENCOUNTER — Encounter: Payer: Self-pay | Admitting: Internal Medicine

## 2013-01-04 ENCOUNTER — Ambulatory Visit (INDEPENDENT_AMBULATORY_CARE_PROVIDER_SITE_OTHER): Payer: Medicare Other | Admitting: Internal Medicine

## 2013-01-04 ENCOUNTER — Telehealth: Payer: Self-pay | Admitting: *Deleted

## 2013-01-04 VITALS — BP 123/80 | HR 101 | Temp 97.5°F | Ht 64.5 in | Wt 195.5 lb

## 2013-01-04 DIAGNOSIS — R34 Anuria and oliguria: Secondary | ICD-10-CM | POA: Diagnosis not present

## 2013-01-04 DIAGNOSIS — R600 Localized edema: Secondary | ICD-10-CM

## 2013-01-04 DIAGNOSIS — M329 Systemic lupus erythematosus, unspecified: Secondary | ICD-10-CM | POA: Diagnosis not present

## 2013-01-04 DIAGNOSIS — R609 Edema, unspecified: Secondary | ICD-10-CM | POA: Diagnosis not present

## 2013-01-04 DIAGNOSIS — E119 Type 2 diabetes mellitus without complications: Secondary | ICD-10-CM | POA: Diagnosis not present

## 2013-01-04 NOTE — Telephone Encounter (Signed)
Caregiver calls back and states pt's feet are both swelling now and urine output is very small, she states she thinks pt cannot wait until tomorrow. appt cancelled for tomorrow w/ dr gill and rescheduled for 1545 dr Garald Braver.

## 2013-01-04 NOTE — Telephone Encounter (Signed)
I agree. She needs to be seen today.

## 2013-01-04 NOTE — Patient Instructions (Addendum)
-  Wrap your feet with ACE band, keep the legs elevated above your heart.  -Call us on Monday if she has decreased urine output.  -Follow up with [place] as needed or in 1-2 months with Dr. Sherrine Maples.

## 2013-01-04 NOTE — Telephone Encounter (Signed)
OK 

## 2013-01-05 ENCOUNTER — Ambulatory Visit: Payer: Medicare Other | Admitting: Internal Medicine

## 2013-01-06 NOTE — Progress Notes (Signed)
  Subjective:    Patient ID: Wanda [name], female    DOB: May 09, 1956, 57 y.o.   MRN: [id number]  HPI Wanda [name] is a 57 year old woman with PMH significant for CVA with permanent Neurologic deficits and HTN who comes in accompanied by her caregiver for evaluation of lower extremity edema and decreased urination. Her caregiver states that Wanda [name] has had dark urine with urine output of 700 ml in the 12 hours prior to her visit. She also notes that the patient has had recurrent lower extremity edema, worse in the right foot that improves with leg elevation. The patient spends most of her days sitting with her legs down and often refuses to participate in physical therapy.  She wears bilateral lower extremity compression stockings most days but sometimes refuses to have them on.    Review of Systems  Constitutional: Negative for fever, chills and appetite change.  Respiratory: Negative for cough and shortness of breath.   Cardiovascular: Positive for leg swelling. Negative for chest pain and palpitations.  Gastrointestinal: Negative for abdominal pain.  Genitourinary: Negative for dysuria.  Musculoskeletal: Positive for myalgias.       Right foot pain  Neurological: Negative for dizziness and headaches.  Hematological: Negative for adenopathy.  Psychiatric/Behavioral: Negative for behavioral problems and agitation.       Objective:   Physical Exam  Nursing note and vitals reviewed. Constitutional: She is oriented to person, place, and time. She appears well-nourished. No distress.  Eyes: Conjunctivae are normal. No scleral icterus.  Cardiovascular: Normal rate and regular rhythm.   Pulmonary/Chest: Effort normal and breath sounds normal. No respiratory distress. She has no wheezes. She has no rales.  Abdominal: Soft.  Musculoskeletal: She exhibits edema and tenderness.  Bilateral 1+ pitting edema up to her knees but right foot with 2+ pitting edema.  Contracture of right upper  extremity.     Neurological: She is alert and oriented to person, place, and time.  Right hemiparesis, expressive aphasia.    Skin: Skin is warm and dry. No rash noted. She is not diaphoretic. There is erythema. No pallor.  Right foot with mild TTP, dry skin on bilateral dorsal feet c/w chronic venous statis changes. Bilateral toes with good capillary refill, palpable dorsalis pedis and posterior tibialis bilaterally.    Psychiatric: She has a normal mood and affect.          Assessment & Plan:

## 2013-01-06 NOTE — Assessment & Plan Note (Signed)
She has chronic LE edema for months to years which is most likely dependent edema as it improves with leg elevation and compression stockings. She refuses to participate in physical therapy and sits most days with her legs down. Her BP is well controlled and she declines increased dose of her HCTZ for increased diuresis.  There is no sign of DVT on physical exam during this visit but if her R foot pain/tender continuous, would consider Doppler US.  -Pt and her caregiver encouraged to wrap the bl feet with ACE band and continue leg elevation to prevent LE edema.  -Pt to continue wearing compression stockings. She will also try PT.

## 2013-01-06 NOTE — Assessment & Plan Note (Signed)
Referral for Eye exam and Rheumatology.

## 2013-01-10 NOTE — Progress Notes (Signed)
Case discussed with Dr. Kennerly soon after the resident saw the patient.  We reviewed the resident's history and exam and pertinent patient test results.  I agree with the assessment, diagnosis, and plan of care documented in the resident's note. 

## 2013-01-20 ENCOUNTER — Telehealth: Payer: Self-pay | Admitting: *Deleted

## 2013-01-20 NOTE — Telephone Encounter (Signed)
We discussed increased her dose of HCTZ to 50mg  daily. Lasix should not be used with HCTZ. If she chooses to take HCTZ 50mg  she should come in for a follow up appointment next week for BP check and repeat BMET. Thanks! SK

## 2013-01-20 NOTE — Telephone Encounter (Signed)
Wille Celeste the caregiver called [phone] - pt still having problems with legs swollen. Wants to try Lasix - states this was discuss last visit. Pt is doing leg exercises that were discussed. Uses [facility] [facility] Amiylah Anastos RN 01/20/13 10:55AM

## 2013-01-22 ENCOUNTER — Other Ambulatory Visit: Payer: Self-pay | Admitting: Internal Medicine

## 2013-02-01 ENCOUNTER — Telehealth: Payer: Self-pay | Admitting: *Deleted

## 2013-02-01 NOTE — Telephone Encounter (Signed)
Ok. Please continue to try to get in touch with her. She may need to be seen in the clinic.

## 2013-02-01 NOTE — Telephone Encounter (Signed)
Someone called, left message that feet are swollen, no answer when called back

## 2013-02-02 ENCOUNTER — Telehealth: Payer: Self-pay | Admitting: *Deleted

## 2013-02-02 NOTE — Telephone Encounter (Signed)
Talked to Dr Sherrine Maples - stated pt needs to make an appt. I talked the caregiver,Janie - at first she was reluctant to sched an appt but will not be able to bring pt to the clinic until Monday b/c she has to schedule w/[place] [employer]. Pt has an appt Monday w/Dr Clyde Lundborg @ [phone]. But I informed her if pt becomes worse/pain or swelling increases prior to appt , to take her to the ED; she agreed.

## 2013-02-02 NOTE — Telephone Encounter (Signed)
Call from pt's caregiver - states pt has swelling of legs and family has agree to give pt Lasix; but states it has not been called in. Lasix is not on pt's current med list. And per Dr Jorje Guild note 9/5 ; pt should not be taking Lasix while on HCTZ, which I informed the caregiver. Janie, the caregiver, also states she has collected urine sample if needed.

## 2013-02-06 ENCOUNTER — Ambulatory Visit (INDEPENDENT_AMBULATORY_CARE_PROVIDER_SITE_OTHER): Payer: Medicare Other | Admitting: Internal Medicine

## 2013-02-06 ENCOUNTER — Encounter: Payer: Self-pay | Admitting: Internal Medicine

## 2013-02-06 ENCOUNTER — Ambulatory Visit (HOSPITAL_COMMUNITY)
Admission: RE | Admit: 2013-02-06 | Discharge: 2013-02-06 | Disposition: A | Discharge status: Home / Self Care | Payer: Medicare Other | Source: Ambulatory Visit | Attending: Internal Medicine | Admitting: Internal Medicine

## 2013-02-06 VITALS — BP 140/90 | HR 80 | Temp 97.0°F

## 2013-02-06 DIAGNOSIS — R600 Localized edema: Secondary | ICD-10-CM

## 2013-02-06 DIAGNOSIS — Z23 Encounter for immunization: Secondary | ICD-10-CM | POA: Diagnosis not present

## 2013-02-06 DIAGNOSIS — R569 Unspecified convulsions: Secondary | ICD-10-CM

## 2013-02-06 DIAGNOSIS — M7989 Other specified soft tissue disorders: Secondary | ICD-10-CM | POA: Insufficient documentation

## 2013-02-06 DIAGNOSIS — I10 Essential (primary) hypertension: Secondary | ICD-10-CM

## 2013-02-06 DIAGNOSIS — J45909 Unspecified asthma, uncomplicated: Secondary | ICD-10-CM

## 2013-02-06 DIAGNOSIS — Z0000 Encounter for general adult medical examination without abnormal findings: Secondary | ICD-10-CM

## 2013-02-06 DIAGNOSIS — E876 Hypokalemia: Secondary | ICD-10-CM

## 2013-02-06 DIAGNOSIS — R609 Edema, unspecified: Secondary | ICD-10-CM

## 2013-02-06 LAB — COMPLETE METABOLIC PANEL WITH GFR
ALT: 16 U/L (ref 0–35)
Albumin: 3.8 g/dL (ref 3.5–5.2)
CO2: 31 mEq/L (ref 19–32)
Calcium: 10.4 mg/dL (ref 8.4–10.5)
Chloride: 94 mEq/L — ABNORMAL LOW (ref 96–112)
Creat: 0.52 mg/dL (ref 0.50–1.10)
GFR, Est African American: >89 mL/min
Potassium: 3.1 mEq/L — ABNORMAL LOW (ref 3.5–5.3)
Sodium: 135 mEq/L (ref 135–145)
Total Bilirubin: 0.2 mg/dL — ABNORMAL LOW (ref 0.3–1.2)
Total Protein: 9.1 g/dL — ABNORMAL HIGH (ref 6.0–8.3)

## 2013-02-06 MED ORDER — POTASSIUM CHLORIDE CRYS ER 20 MEQ PO TBCR
40.0000 meq | EXTENDED_RELEASE_TABLET | Freq: Once | ORAL | Status: AC
  Administered 2013-02-06: 40 meq via ORAL

## 2013-02-06 MED ORDER — POTASSIUM CHLORIDE CRYS ER 10 MEQ PO TBCR: 40.0000 meq | EXTENDED_RELEASE_TABLET | Freq: Once | ORAL | Status: DC

## 2013-02-06 MED ORDER — POTASSIUM CHLORIDE ER 10 MEQ PO TBCR: 10.0000 meq | EXTENDED_RELEASE_TABLET | Freq: Every day | ORAL | Status: DC

## 2013-02-06 MED ORDER — FUROSEMIDE 20 MG PO TABS: 20.0000 mg | ORAL_TABLET | Freq: Every day | ORAL | Status: DC

## 2013-02-06 NOTE — Assessment & Plan Note (Signed)
It is stable. Patient does not have cough, chest pain, wheezing or shortness of breath. Her lung auscultation is clear bilaterally. Will continue albuterol nebulizer when necessary.

## 2013-02-06 NOTE — Assessment & Plan Note (Addendum)
Ii is well controlled. She has not had seizure episodes since 2010. She is currently taking Keppra and compliant to medication. Continue current regimen.

## 2013-02-06 NOTE — Assessment & Plan Note (Signed)
BP Readings from Last 3 Encounters:  02/06/13 140/90  01/04/13 123/80  12/08/12 112/73    Lab Results  Component Value Date   NA 137 09/21/2012   K 3.9 09/21/2012   CREATININE 0.71 09/21/2012    Assessment: Blood pressure control: controlled Progress toward BP goal:  at goal Comments:  Plan: Medications:  continue current medications Educational resources provided: brochure Self management tools provided:   Other plans: it is well controlled. Will continue current regimen.

## 2013-02-06 NOTE — Progress Notes (Addendum)
Patient ID: Wanda [name], female   DOB: [birth year], 57 y.o.   MRN: [id number] Subjective:   Patient ID: Wanda [name] female   DOB: 1955-12-13 57 y.o.   MRN: [id number]  CC:   Follow up visit.  HPI:  Wanda [name] is a 57 y.o. man lady with past medical history as outlined below, who presents for a followup visit today  Patient is accompanied by her care giver today.    1. Leg edema: She has intermittent  foot/leg swelling that has been chronic for more than 3 months. Her leg edema is asymmetric(right leg is worse than the left). It was believed to be caused by venous insufficiency. She is supposed to be wearing compression stockings, but she has not been doing it consistently. She was also encouraged to elevate her legs at home. Patient is currently taking HCTZ 25 mg daily. She has tenderness over the right leg. Patient's liver function and renal function were normal in the past. Her 2-D echo on 02/02/12 showed EF of 55-60% with grade 1 diastolic dysfunction. Patient does not have a chest pain, shortness of breath and palpitation.   2. Asthma: Patient is using albuterol nebulizer when necessary. She does not have chest pain, cough, shortness of breath and wheezing.   3. HTN: The blood pressure is 140/90. She is currently taking hydrochlorothiazide 25 mg daily, lisinopril 10 mg daily, metoprolol 50 mg twice a day.  4. Seizure: Patient's has been compliant to her Keppra. She has not had seizure episodes since 2010.   ROS:  Denies fever, chills, fatigue, headaches,  cough, chest pain, SOB,  abdominal pain ,diarrhea, constipation, dysuria, urgency, frequency, hematuria.   * Care giver, Ms. Jill Side: [phone].   Past Medical History  Diagnosis Date  . Depression   . GERD (gastroesophageal reflux disease)   . Hypertension   . 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 Stroke     ruptured berry aneurysm, right hemiparesis and expressive aphasia  . SLE (systemic  lupus erythematosus)   . Venous insufficiency (chronic) (peripheral)   . Osteopenia   . ANEMIA-IRON DEFICIENCY 03/02/2006    H&H 8.5/25.9 5/09. On 5/13 12.8/39.7 with MCV 80.4.     Marland Kitchen CEREBROVASCULAR ACCIDENT, HX OF 03/02/2006    Ruptured berry aneurysm, right hemiparesis & expressive aphasia      Current Outpatient Prescriptions  Medication Sig Dispense Refill  . albuterol (PROVENTIL) (5 MG/ML) 0.5% nebulizer solution Take 0.5 mLs (2.5 mg total) by nebulization every 4 (four) hours as needed. For shortness of breath  20 mL  3  . alendronate (FOSAMAX) 10 MG tablet Take 10 mg by mouth daily before breakfast. Take with a full glass of water on an empty stomach.       Marland Kitchen amLODipine (NORVASC) 10 MG tablet Take 0.5 tablets (5 mg total) by mouth daily.  30 tablet  3  . aspirin 81 MG tablet Take 81 mg by mouth daily.        . baclofen (LIORESAL) 10 MG tablet       . calcium-vitamin D (OSCAL) 250-125 MG-UNIT per tablet Take 1 tablet by mouth daily.        . Capsaicin-Menthol-Methyl Sal (CAPSAICIN-METHYL SAL-MENTHOL) 0.025-1-12 % CREA Apply 1 application topically 3 (three) times daily.  1 Tube  1  . gabapentin (NEURONTIN) 300 MG capsule Take 1 capsule (300 mg total) by mouth 3 (three) times daily.  90 capsule  3  . hydrochlorothiazide (  HYDRODIURIL) 25 MG tablet TAKE ONE (1) TABLET EACH DAY  30 tablet  6  . HYDROcodone-acetaminophen (NORCO/VICODIN) 5-325 MG per tablet Take 1 tablet by mouth every 6 (six) hours as needed for pain.  30 tablet  5  . hydroxychloroquine (PLAQUENIL) 200 MG tablet Take 1 tablet (200 mg total) by mouth 2 (two) times daily.  60 tablet  1  . Incontinence Supply Disposable (PROTECTIVE UNDERWEAR MEDIUM) MISC by Does not apply route.        . levETIRAcetam (KEPPRA) 500 MG tablet TAKE ONE TABLET TWICE DAILY  60 tablet  11  . lisinopril (PRINIVIL,ZESTRIL) 10 MG tablet TAKE ONE (1) TABLET EACH DAY  90 tablet  3  . loratadine (CLARITIN) 10 MG tablet Take 1 tablet (10 mg total) by mouth  daily.  30 tablet  3  . metoCLOPramide (REGLAN) 5 MG tablet TAKE ONE TABLET FOUR TIMES DAILY  120 tablet  6  . metoprolol (LOPRESSOR) 50 MG tablet TAKE ONE TABLET TWICE DAILY  180 tablet  3  . mometasone (NASONEX) 50 MCG/ACT nasal spray 2 spray in each nostril daily in the morning  17 g  0  . omeprazole (PRILOSEC) 20 MG capsule TAKE ONE (1) CAPSULE EACH DAY  31 capsule  11  . promethazine (PHENERGAN) 12.5 MG tablet Take 12.5 mg by mouth every 8 (eight) hours as needed.      . promethazine (PROMETHEGAN) 25 MG suppository Place 1 suppository (25 mg total) rectally every 6 (six) hours as needed for nausea.  12 each  0  . venlafaxine XR (EFFEXOR-XR) 75 MG 24 hr capsule TAKE ONE (1) CAPSULE EACH DAY  31 capsule  11  . zolpidem (AMBIEN) 5 MG tablet Take 1 tablet (5 mg total) by mouth at bedtime as needed. For insomnia  30 tablet  3   No current facility-administered medications for this visit.   Family History  Problem Relation Age of Onset  . Heart attack Brother 38   History   Social History  . Marital Status: Widowed    Spouse Name: N/A    Number of Children: N/A  . Years of Education: N/A   Social History Main Topics  . Smoking status: Former Smoker -- 1.50 packs/day    Types: Cigarettes    Quit date: 05/30/2003  . Smokeless tobacco: Never Used     Comment: she smoked for 30 years before she quit  . Alcohol Use: No  . Drug Use: No  . Sexual Activity: None   Other Topics Concern  . None   Social History Narrative   Has a home nurse,    Lives with her 2 sons,    Former smoker   Widow          Review of Systems: Full 14-point review of systems otherwise negative. See HPI.   Objective:  Physical Exam: Filed Vitals:   02/06/13 1005  BP: 140/90  Pulse: 80  Temp: 97 F (36.1 C)  TempSrc: Oral  SpO2: 96%   Constitutional: Vital signs reviewed.  No acute distress and cooperative with exam.   HEENT:  Head: Normocephalic and atraumatic Mouth: no erythema or exudates,  MMM Eyes: PERRL, EOMI, conjunctivae normal, No scleral icterus.  Neck: Supple, Trachea midline normal ROM, No JVD  Cardiovascular: RRR, S1 normal, S2 normal, no MRG, pulses symmetric and intact bilaterally Pulmonary/Chest: CTAB, no wheezes, rales, or rhonchi Abdominal: Soft. Non-tender, non-distended, bowel sounds are normal, no masses, organomegaly, or guarding present.  GU: no CVA  tenderness Musculoskeletal: Contracture of right upper extremity.   Extremities: there is asymmetric leg edema, 1+ pitting demai in right leg/ankle and trace edema in left leg. There is tenderness over the right leg circumferentially.  Dry skin on bilateral dorsal feet c/w chronic venous statis changes. Palpable dorsalis pedis and posterior tibialis bilaterally.  Hematology: no cervical, inginal, or axillary adenopathy.  Neurological: A&O x3, Right hemiparesis, expressive aphasia.  Skin: Warm, dry and intact. No rash, cyanosis, or clubbing.  Psychiatric: Normal mood and affect. No suicidal or homicidal ideation.   Assessment & Plan:   Addendum: 02/09/13  Patient developved mild L facial swelling after switched from HCTZ to Lasix on 9/24. She was evaluated by dr. Juleen China in ED, who instructed patient to have stopped taking Lasix and Lisinopril. pateint has allergy to "sulfur" reported as itching before. But she was on HCTZ, which is also a sulfur-containing med. Since her symptoms developed after newly started lasix, it is most likely due to Lasix allergy.   -will switch back to HCTZ 25 mg daily. If her leg edema gets worse, may consider another non-sulfur loop medication, such as Ethacrynic acid. -will start patient with low dose of hydralazine 25 mg tid.  -she has appointment in clinic on 02/13/09 for check BMP  Lorretta Harp, MD PGY3, Internal Medicine Teaching Service Pager: [phone]

## 2013-02-06 NOTE — Progress Notes (Signed)
*  PRELIMINARY RESULTS* Vascular Ultrasound Right lower extremity venous duplex has been completed.  Preliminary findings: no obvious evidence of DVT in visualized veins.   Attempted call report to Dr. Clyde Lundborg. Left voice message with results.   Farrel Demark, RDMS, RVT  02/06/2013, 11:59 AM

## 2013-02-06 NOTE — Patient Instructions (Addendum)
1. Please wrap your feet with ACE band, keep the legs elevated above your heart.   2. Please stop taking hydrochlorothiazide from now.   3. please start taking Lasix 20 mg daily. Please also start taking potassium chloride (K-Dur) 10 mEq daily when you take Lasix. If you stop taking Lasix, please also stop taking potassium chloride (K-Dur).   4. Please come back to lab to check your potassium level on next Monday (9/29), you do no need to make an appointment. Just come to the lab and get your blood drawn.   5. If you have worsening of your symptoms or new symptoms arise, please call the clinic ([phone]), or go to the ER immediately if symptoms are severe.   Venous Stasis and Chronic Venous Insufficiency As people age, the veins located in their legs may weaken and stretch. When veins weaken and lose the ability to pump blood effectively, the condition is called chronic venous insufficiency (CVI) or venous stasis. Almost all veins return blood back to the heart. This happens by:  The force of the heart pumping fresh blood pushes blood back to the heart.  Blood flowing to the heart from the force of gravity. In the deep veins of the legs, blood has to fight gravity and flow upstream back to the heart. Here, the leg muscles contract to pump blood back toward the heart. Vein walls are elastic, and many veins have small valves that only allow blood to flow in one direction. When leg muscles contract, they push inward against the elastic vein walls. This squeezes blood upward, opens the valves, and moves blood toward the heart. When leg muscles relax, the vein wall also relaxes and the valves inside the vein close to prevent blood from flowing backward. This method of pumping blood out of the legs is called the venous pump. CAUSES  The venous pump works best while walking and leg muscles are contracting. But when a person sits or stands, blood pressure in leg veins can build. Deep veins are usually  able to withstand short periods of inactivity, but long periods of inactivity (and increased pressure) can stretch, weaken, and damage vein walls. High blood pressure can also stretch and damage vein walls. The veins may no longer be able to pump blood back to the heart. Venous hypertension (high blood pressure inside veins) that lasts over time is a primary cause of CVI. CVI can also be caused by:   Deep vein thrombosis, a condition where a thrombus (blood clot) blocks blood flow in a vein.  Phlebitis, an inflammation of a superficial vein that causes a blood clot to form. Other risk factors for CVI may include:   Heredity.  Obesity.  Pregnancy.  Sedentary lifestyle.  Smoking.  Jobs requiring long periods of standing or sitting in one place.  Age and gender:  Women in their 50's and 53's and men in their 68's are more prone to developing CVI. SYMPTOMS  Symptoms of CVI may include:   Varicose veins.  Ulceration or skin breakdown.  Lipodermatosclerosis, a condition that affects the skin just above the ankle, usually on the inside surface. Over time the skin becomes brown, smooth, tight and often painful. Those with this condition have a high risk of developing skin ulcers.  Reddened or discolored skin on the leg.  Swelling. DIAGNOSIS  Your caregiver can diagnose CVI after performing a careful medical history and physical examination. To confirm the diagnosis, the following tests may also be ordered:   Duplex  ultrasound.  Plethysmography (tests blood flow).  Venograms (x-ray using a special dye). TREATMENT The goals of treatment for CVI are to restore a person to an active life and to minimize pain or disability. Typically, CVI does not pose a serious threat to life or limb, and with proper treatment most people with this condition can continue to lead active lives. In most cases, mild CVI can be treated on an outpatient basis with simple procedures. Treatment methods  include:   Elastic compression socks.  Sclerotherapy, a procedure involving an injection of a material that "dissolves" the damaged veins. Other veins in the network of blood vessels take over the function of the damaged veins.  Vein stripping (an older procedure less commonly used).  Laser Ablation surgery.  Valve repair. HOME CARE INSTRUCTIONS   Elastic compression socks must be worn every day. They can help with symptoms and lower the chances of the problem getting worse, but they do not cure the problem.  Only take over-the-counter or prescription medicines for pain, discomfort, or fever as directed by your caregiver.  Your caregiver will review your other medications with you. SEEK MEDICAL CARE IF:   You are confused about how to take your medications.  There is redness, swelling, or increasing pain in the affected area.  There is a red streak or line that extends up or down from the affected area.  There is a breakdown or loss of skin in the affected area, even if the breakdown is small.  You develop an unexplained oral temperature above 102 F (38.9 C).  There is an injury to the affected area. SEEK IMMEDIATE MEDICAL CARE IF:   There is an injury and open wound to the affected area.  Pain is not adequately relieved with pain medication prescribed or becomes severe.  An oral temperature above 102 F (38.9 C) develops.  The foot/ankle below the affected area becomes suddenly numb or the area feels weak and hard to move. MAKE SURE YOU:   Understand these instructions.  Will watch your condition.  Will get help right away if you are not doing well or get worse. Document Released: 09/07/2006 Document Revised: 07/27/2011 Document Reviewed: 11/15/2006 [facility] Patient Information 2014 [place], [place].

## 2013-02-06 NOTE — Assessment & Plan Note (Signed)
-  will give flu shot today. -Mammogram is scheduled on March 22, 2103.

## 2013-02-06 NOTE — Assessment & Plan Note (Addendum)
Patient has bilateral leg edema, which is asymmetric (right leg is worse than the left). It is most likely caused by venous insufficiency. It is unlikely to be due to the liver or renal dysfunction giving they were normal in the past. Patient has grade 1 diastolic dysfunction on 2-D echo on 02/02/12, but she does not have signs of acute exacerbation. She does not have JVD and no shortness of breath. Her leg edema is less likely to be caused by congestive heart failure exacerbation. Her asymmetric pattern of leg edema is concerning for DVT which needs to be ruled out. Stat lower extremity Doppler-Right side is negative for DVT today.  - Will continue compression stockings and elevation of leg for venous insufficiency. - Will switch hydrochlorothiazide to lasix 20 mg daily.  - Will start low dose of potassium chloride (K-Dur) 10 mEq daily with Lasix (patient is also taking lisinopril) - Her K=3.1 today, repleted with 40 mEq KCl in clinic.   - Patient was asked to come back to lab to check  potassium level on next Monday (9/29).

## 2013-02-06 NOTE — Progress Notes (Addendum)
Case discussed with Dr. Niu at the time of the visit.  We reviewed the resident's history and exam and pertinent patient test results.  I agree with the assessment, diagnosis, and plan of care documented in the resident's note.    

## 2013-02-08 ENCOUNTER — Emergency Department (HOSPITAL_COMMUNITY)
Admission: EM | Admit: 2013-02-08 | Discharge: 2013-02-09 | Disposition: A | Discharge status: Home / Self Care | Payer: Medicare Other | Attending: Emergency Medicine | Admitting: Emergency Medicine

## 2013-02-08 ENCOUNTER — Telehealth: Payer: Self-pay | Admitting: Internal Medicine

## 2013-02-08 ENCOUNTER — Encounter (HOSPITAL_COMMUNITY): Payer: Self-pay | Admitting: Emergency Medicine

## 2013-02-08 DIAGNOSIS — Z880 Allergy status to penicillin: Secondary | ICD-10-CM | POA: Insufficient documentation

## 2013-02-08 DIAGNOSIS — I10 Essential (primary) hypertension: Secondary | ICD-10-CM | POA: Diagnosis not present

## 2013-02-08 DIAGNOSIS — F3289 Other specified depressive episodes: Secondary | ICD-10-CM | POA: Insufficient documentation

## 2013-02-08 DIAGNOSIS — Z8679 Personal history of other diseases of the circulatory system: Secondary | ICD-10-CM | POA: Diagnosis not present

## 2013-02-08 DIAGNOSIS — R220 Localized swelling, mass and lump, head: Secondary | ICD-10-CM | POA: Insufficient documentation

## 2013-02-08 DIAGNOSIS — Z862 Personal history of diseases of the blood and blood-forming organs and certain disorders involving the immune mechanism: Secondary | ICD-10-CM | POA: Insufficient documentation

## 2013-02-08 DIAGNOSIS — K219 Gastro-esophageal reflux disease without esophagitis: Secondary | ICD-10-CM | POA: Diagnosis not present

## 2013-02-08 DIAGNOSIS — IMO0002 Reserved for concepts with insufficient information to code with codable children: Secondary | ICD-10-CM | POA: Diagnosis not present

## 2013-02-08 DIAGNOSIS — Z79899 Other long term (current) drug therapy: Secondary | ICD-10-CM | POA: Diagnosis not present

## 2013-02-08 DIAGNOSIS — F329 Major depressive disorder, single episode, unspecified: Secondary | ICD-10-CM | POA: Diagnosis not present

## 2013-02-08 DIAGNOSIS — G40909 Epilepsy, unspecified, not intractable, without status epilepticus: Secondary | ICD-10-CM | POA: Diagnosis not present

## 2013-02-08 DIAGNOSIS — Z8673 Personal history of transient ischemic attack (TIA), and cerebral infarction without residual deficits: Secondary | ICD-10-CM | POA: Diagnosis not present

## 2013-02-08 DIAGNOSIS — M899 Disorder of bone, unspecified: Secondary | ICD-10-CM | POA: Diagnosis not present

## 2013-02-08 DIAGNOSIS — Z7982 Long term (current) use of aspirin: Secondary | ICD-10-CM | POA: Diagnosis not present

## 2013-02-08 DIAGNOSIS — M329 Systemic lupus erythematosus, unspecified: Secondary | ICD-10-CM | POA: Insufficient documentation

## 2013-02-08 DIAGNOSIS — Z87891 Personal history of nicotine dependence: Secondary | ICD-10-CM | POA: Insufficient documentation

## 2013-02-08 DIAGNOSIS — T50995A Adverse effect of other drugs, medicaments and biological substances, initial encounter: Secondary | ICD-10-CM | POA: Diagnosis not present

## 2013-02-08 DIAGNOSIS — L259 Unspecified contact dermatitis, unspecified cause: Secondary | ICD-10-CM | POA: Diagnosis not present

## 2013-02-08 NOTE — ED Notes (Signed)
Bed: HY86 Expected date:  Expected time:  Means of arrival:  Comments: EMS ? Allergic Reaction

## 2013-02-08 NOTE — Telephone Encounter (Addendum)
[phone] called by caretaker of the patient.  Patient started on new medications 02/06/13 which may be causing swelling on the side face by temples Lasix 20 mg, Kdur 10 meQ, Loratadine 10 mg.  Patient started taking the medications medications 02/06/13.  Swelling started today a few minutes ago.  Pt is swallowing ok.  Pt is wheezing a lot which is not normal because asthma is normally controlled with nebulizer.  Patient is having sob.  Advised caretaker and pts son to bring her into the ED for evaluation. Asked if they had benadryl at home and they do not.  They will bring pt to the ED for evaluation.    Shirlee Latch MD

## 2013-02-08 NOTE — ED Notes (Signed)
Brought in by EMS from home with c/o "allergic reaction"; pt's daughter called EMS (pt is normally non-verbal).  Per EMS, pt's daughter called pt's PCP and notified pt's "allergic reaction" to medicine; pt's daughter unable to tell exactly what the pt's new changes on pt when EMS asked what her basis is for "an allergic reaction"-- pt's daughter reported that she called pt's PCP and was advised to send pt to ED for evaluation; pt's daughter reports pt took "Sulfa and Lasix" and she has allergic reaction to these.  Pt presents to ED alert and responsive but nonverbal (her norm), denies pain, denies itching, skin warm and dry and in no s/s apparent distress.

## 2013-02-09 ENCOUNTER — Telehealth: Payer: Self-pay | Admitting: *Deleted

## 2013-02-09 ENCOUNTER — Other Ambulatory Visit: Payer: Self-pay | Admitting: Internal Medicine

## 2013-02-09 MED ORDER — HYDRALAZINE HCL 25 MG PO TABS: 25.0000 mg | ORAL_TABLET | Freq: Three times a day (TID) | ORAL | Status: DC

## 2013-02-09 NOTE — ED Provider Notes (Signed)
CSN: [id number]     Arrival date & time 02/08/13  2338 History   First MD Initiated Contact with Patient 02/08/13 2343     Chief Complaint  Patient presents with  . Allergic Reaction   (Consider location/radiation/quality/duration/timing/severity/associated sxs/prior Treatment) HPI  57 year old female with left facial swelling. Onset just before arrival. Patient with no other acute complaints. Denies any difficulty breathing or swallowing. No shortness of breath. No, pain. No nausea, vomiting or diarrhea. Only new exposure that she is aware of his recently starting Lasix for some mild lower extremity swelling. Reports history of an allergy to sulfa. Denies trauma. No intervention prior to arrival.  Past Medical History  Diagnosis Date  . Depression   . GERD (gastroesophageal reflux disease)   . Hypertension   . 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 Stroke     ruptured berry aneurysm, right hemiparesis and expressive aphasia  . SLE (systemic lupus erythematosus)   . Venous insufficiency (chronic) (peripheral)   . Osteopenia   . ANEMIA-IRON DEFICIENCY 03/02/2006    H&H 8.5/25.9 5/09. On 5/13 12.8/39.7 with MCV 80.4.     Marland Kitchen CEREBROVASCULAR ACCIDENT, HX OF 03/02/2006    Ruptured berry aneurysm, right hemiparesis & expressive aphasia      Past Surgical History  Procedure Laterality Date  . Total abdominal hysterectomy  5/09    Benign cervix, uterine fibroids on bx  . Cerebral aneurysm repair      1990's  . Esophagogastroduodenoscopy  2001    Varices and esophagitis   Family History  Problem Relation Age of Onset  . Heart attack Brother 60   History  Substance Use Topics  . Smoking status: Former Smoker -- 1.50 packs/day    Types: Cigarettes    Quit date: 05/30/2003  . Smokeless tobacco: Never Used     Comment: she smoked for 30 years before she quit  . Alcohol Use: No   OB History   Grav Para Term Preterm Abortions TAB SAB Ect Mult Living                  Review of Systems  All systems reviewed and negative, other than as noted in HPI.   Allergies  Penicillins and Sulfur  Home Medications   Current Outpatient Rx  Name  Route  Sig  Dispense  Refill  . albuterol (PROVENTIL) (5 MG/ML) 0.5% nebulizer solution   Nebulization   Take 0.5 mLs (2.5 mg total) by nebulization every 4 (four) hours as needed. For shortness of breath   20 mL   3   . amLODipine (NORVASC) 10 MG tablet   Oral   Take 0.5 tablets (5 mg total) by mouth daily.   30 tablet   3   . aspirin 81 MG tablet   Oral   Take 81 mg by mouth daily.           . baclofen (LIORESAL) 10 MG tablet               . calcium-vitamin D (OSCAL) 250-125 MG-UNIT per tablet   Oral   Take 1 tablet by mouth daily.           . Capsaicin-Menthol-Methyl Sal (CAPSAICIN-METHYL SAL-MENTHOL) 0.025-1-12 % CREA   Apply externally   Apply 1 application topically 3 (three) times daily.   1 Tube   1   . furosemide (LASIX) 20 MG tablet   Oral   Take 1 tablet (20 mg total) by  mouth daily.   30 tablet   1   . gabapentin (NEURONTIN) 300 MG capsule   Oral   Take 1 capsule (300 mg total) by mouth 3 (three) times daily.   90 capsule   3   . HYDROcodone-acetaminophen (NORCO/VICODIN) 5-325 MG per tablet   Oral   Take 1 tablet by mouth every 6 (six) hours as needed for pain.   30 tablet   5   . hydroxychloroquine (PLAQUENIL) 200 MG tablet   Oral   Take 1 tablet (200 mg total) by mouth 2 (two) times daily.   60 tablet   1   . Incontinence Supply Disposable (PROTECTIVE UNDERWEAR MEDIUM) MISC   Does not apply   by Does not apply route.           . levETIRAcetam (KEPPRA) 500 MG tablet      TAKE ONE TABLET TWICE DAILY   60 tablet   11     PLEASE RESPOND   . lisinopril (PRINIVIL,ZESTRIL) 10 MG tablet      TAKE ONE (1) TABLET EACH DAY   90 tablet   3     PLEASE RESPOND   . loratadine (CLARITIN) 10 MG tablet   Oral   Take 1 tablet (10 mg total) by mouth daily.    30 tablet   3   . metoCLOPramide (REGLAN) 5 MG tablet      TAKE ONE TABLET FOUR TIMES DAILY   120 tablet   6   . metoprolol (LOPRESSOR) 50 MG tablet      TAKE ONE TABLET TWICE DAILY   180 tablet   3     PLEASE RESPOND   . mometasone (NASONEX) 50 MCG/ACT nasal spray      2 spray in each nostril daily in the morning   17 g   0   . omeprazole (PRILOSEC) 20 MG capsule      TAKE ONE (1) CAPSULE EACH DAY   31 capsule   11     PLEASE RESPOND   . promethazine (PHENERGAN) 12.5 MG tablet   Oral   Take 12.5 mg by mouth every 8 (eight) hours as needed.         . venlafaxine XR (EFFEXOR-XR) 75 MG 24 hr capsule      TAKE ONE (1) CAPSULE EACH DAY   31 capsule   11     PLEASE RESPOND   . zolpidem (AMBIEN) 5 MG tablet   Oral   Take 1 tablet (5 mg total) by mouth at bedtime as needed. For insomnia   30 tablet   3    BP 158/89  Pulse 91  Temp(Src) 97.6 F (36.4 C) (Oral)  Resp 18  SpO2 97% Physical Exam  Nursing note and vitals reviewed. Constitutional: She appears well-developed and well-nourished. No distress.  HENT:  Head: Normocephalic and atraumatic.  Mild left facial fullness. Oropharynx is clear. Handling secretions. No stridor.   Eyes: Conjunctivae are normal. Right eye exhibits no discharge. Left eye exhibits no discharge.  Neck: Neck supple.  Cardiovascular: Normal rate, regular rhythm and normal heart sounds.  Exam reveals no gallop and no friction rub.   No murmur heard. Pulmonary/Chest: Effort normal and breath sounds normal. No respiratory distress. She has no wheezes.  Lungs clear. No increased work of breathing.  Abdominal: Soft. She exhibits no distension. There is no tenderness.  Musculoskeletal: She exhibits no edema and no tenderness.  Neurological: She is alert.  Right-sided weakness/contractures. Aphasic, but  gestures with hands and nods head with questioning.   Skin: Skin is warm and dry.  No rash noted  Psychiatric: She has a normal  mood and affect. Her behavior is normal. Thought content normal.    ED Course  Procedures (including critical care time) Labs Review Labs Reviewed - No data to display Imaging Review No results found.  MDM   1. Left facial swelling      12:21 AM 56yF with mild L facial swelling. No other complaints. Took lasix for first time today. Has allergy to "sulfur" reported as itching. Given that lasix contains sulfonamide, this may be etiology. Also on lisinopril. Symptoms started about an hour prior to arrival. No evidence of airway compromise or HD instability. Will continue to monitor at this time.   Patient was monitored in the emergency room with no deterioration. Remained hemodynamically stable. Mild facial swelling is unchanged. Could possibly be secondary to Lasix. She is also on ACE inhibitor as well. She's instructed to discontinue both medications and followup with her prescribing provider session as she can. Emergent return cautions were discussed.  Raeford Razor, MD 02/14/13 1800

## 2013-02-09 NOTE — ED Notes (Signed)
Pt's daughter here visiting---- reports that pt was started on Lasix last Monday for BLE edema; has observed that pt's right face "is swelled up" tonight and so she called pt's PCP; was advised to come to ED for evaluation.

## 2013-02-09 NOTE — Telephone Encounter (Signed)
Call from pt assistance, Rebeca Alert - states pt had to be taken to the ED after taking Lasix d/t left side facial swelling.  ED doctor told her to stop Lasix and Lisinopril. She wants to know what HTN med should she give Ms Annas.  Thanks

## 2013-02-09 NOTE — Addendum Note (Signed)
Addended by: Lorretta Harp on: 02/09/2013 02:24 PM   Modules accepted: Orders, Medications

## 2013-02-09 NOTE — Telephone Encounter (Signed)
Patient developved mild L facial swelling after switched from HCTZ to Lasix on 9/24. She was evaluated by dr. Juleen China in ED, who instructed patient to have stopped taking Lasix and Lisinopril. pateint has allergy to "sulfur" reported as itching before. But she was on HCTZ, which is also a sulfur-containing med. Since her symptoms developed after newly started lasix, it is most likely due to Lasix allergy.   -will switch back to HCTZ 25 mg daily. If her leg edema gets worse, may consider another non-sulfur loop medication, such as Ethacrynic acid.  -will not restart Lisinopril now. -will start patient with low dose of hydralazine 25 mg tid.  -she has appointment in clinic on 02/13/09.   Lorretta Harp, MD  PGY3, Internal Medicine Teaching Service  Pager: [phone]

## 2013-02-09 NOTE — Telephone Encounter (Signed)
I talked to Rebeca Alert - stated she talked to Dr Clyde Lundborg and pt's med was changed.

## 2013-02-13 ENCOUNTER — Other Ambulatory Visit: Payer: Medicare Other

## 2013-02-15 DIAGNOSIS — H2510 Age-related nuclear cataract, unspecified eye: Secondary | ICD-10-CM | POA: Diagnosis not present

## 2013-02-15 DIAGNOSIS — L930 Discoid lupus erythematosus: Secondary | ICD-10-CM | POA: Diagnosis not present

## 2013-02-16 ENCOUNTER — Encounter: Payer: Self-pay | Admitting: Internal Medicine

## 2013-02-16 ENCOUNTER — Ambulatory Visit (INDEPENDENT_AMBULATORY_CARE_PROVIDER_SITE_OTHER): Payer: Medicare Other | Admitting: Internal Medicine

## 2013-02-16 VITALS — BP 128/79 | HR 120 | Temp 97.7°F

## 2013-02-16 DIAGNOSIS — M329 Systemic lupus erythematosus, unspecified: Secondary | ICD-10-CM | POA: Diagnosis not present

## 2013-02-16 DIAGNOSIS — G4700 Insomnia, unspecified: Secondary | ICD-10-CM

## 2013-02-16 DIAGNOSIS — I10 Essential (primary) hypertension: Secondary | ICD-10-CM | POA: Diagnosis not present

## 2013-02-16 DIAGNOSIS — M810 Age-related osteoporosis without current pathological fracture: Secondary | ICD-10-CM | POA: Diagnosis not present

## 2013-02-16 DIAGNOSIS — Z0000 Encounter for general adult medical examination without abnormal findings: Secondary | ICD-10-CM

## 2013-02-16 NOTE — Assessment & Plan Note (Signed)
Pt has been referred to Rheumatology and has an appt on 02/28/13.

## 2013-02-16 NOTE — Assessment & Plan Note (Signed)
Per caregiver, the patient is eating better with decreased fried food and salt intake. She is also exercising more.

## 2013-02-16 NOTE — Assessment & Plan Note (Addendum)
BP Readings from Last 3 Encounters:  02/16/13 128/79  02/09/13 146/72  02/06/13 140/90    Lab Results  Component Value Date   NA 135 02/06/2013   K 3.1* 02/06/2013   CREATININE 0.52 02/06/2013    Assessment: Blood pressure control: controlled Progress toward BP goal:  improved (but she is significantly more tachycardic despite being beta blocked, which is concerning that her blood pressure is too low causing her to be tachycardic to improve blood flow despite being BB. Some of the tachycardia could be explained by recently starting the hydralazine. Comments: She is on Metoprolol 50mg  BID, Amlodipine 5mg  qd, and Hydralazine 25mg  TID  Plan: Medications:  Stopping the Amlodipine Other plans: Will reassess blood pressure and heart rate in 2 weeks

## 2013-02-16 NOTE — Assessment & Plan Note (Signed)
Per care giver, the pt is sleeping much better since she was seen by me in July. She is no longer needing the Ambien and is not taking it.

## 2013-02-16 NOTE — Patient Instructions (Signed)
**  Stop taking the amlodipine.  **F/u in 2 weeks for a blood pressure check

## 2013-02-16 NOTE — Assessment & Plan Note (Addendum)
Unable to find DEXA scan results in the system. Per caregiver, she is followed by Dr. Albertha Ghee who prescribes the fosamax and is to see the patient sometime soon. Per caregiver, the pt has been on the Fosamax for about 6 years. The pt is to f/u in 2 weeks. Will schedule DEXA at that time to evaluate her bone density, as she may be able to stop the fosamax- the caregiver had to leave the clinic urgently taking the patient with her before orders could be placed for the patient.

## 2013-02-16 NOTE — Progress Notes (Signed)
Patient ID: Wanda [name], female   DOB: June 05, 1955, 57 y.o.   MRN: [id number]  Subjective:   Patient ID: Wanda [name] female   DOB: Apr 20, 1956 57 y.o.   MRN: [id number]  HPI: Wanda [name] is a 57 y.o. 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  Her last clinic visit she was started on Lasix secondary to worsening lower extremity edema. At that time her HCTZ was stopped. The patient's caregiver called on 9/24 concerned that the patient is having new left sided facial swelling that continued into her neck causing some increase shortness of breath and wheezing. Denied any swallowing dysfunction. She taken to the ED where she was told to stop the Lasix and stop her lisinopril. The following day, on 9/25, she was restarted on her home HCTZ in addition to starting hydralazine 25 mg 3 times a day. Per patient and her caregiver, she's been doing much better since stopping the Lasix, but her heart rate has been elevated. Of note the caregiver says that she had not been taking the lisinopril. Her caregiver states that the patient's diet has improved; she's eating only baked foods with decreased salt content.  She has an appointment scheduled with Rheumatology for the 16th of Oct. She is being followed by an opthalmologist, Dr. Nadyne Coombes. Per caregiver, she has cataracts. She is also followed by Albertha Ghee, who is her "bone doctor", and per caregiver, she is following the patient's fosamax.   Past Medical History  Diagnosis Date  . Depression   . GERD (gastroesophageal reflux disease)   . Hypertension   . 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 Stroke     ruptured berry aneurysm, right hemiparesis and expressive aphasia  . SLE (systemic lupus erythematosus)   . Venous insufficiency (chronic) (peripheral)   . Osteopenia   . ANEMIA-IRON DEFICIENCY  03/02/2006    H&H 8.5/25.9 5/09. On 5/13 12.8/39.7 with MCV 80.4.     Marland Kitchen CEREBROVASCULAR ACCIDENT, HX OF 03/02/2006    Ruptured berry aneurysm, right hemiparesis & expressive aphasia      Current Outpatient Prescriptions  Medication Sig Dispense Refill  . albuterol (PROVENTIL) (5 MG/ML) 0.5% nebulizer solution Take 0.5 mLs (2.5 mg total) by nebulization every 4 (four) hours as needed. For shortness of breath  20 mL  3  . alendronate (FOSAMAX) 70 MG tablet Take 70 mg by mouth every 7 (seven) days. Tuesday. Take with a full glass of water on an empty stomach.      Marland Kitchen amLODipine (NORVASC) 10 MG tablet Take 0.5 tablets (5 mg total) by mouth daily.  30 tablet  3  . aspirin 81 MG tablet Take 81 mg by mouth daily.        . baclofen (LIORESAL) 10 MG tablet       . calcium-vitamin D (OSCAL) 250-125 MG-UNIT per tablet Take 1 tablet by mouth daily.        . Capsaicin-Menthol-Methyl Sal (CAPSAICIN-METHYL SAL-MENTHOL) 0.025-1-12 % CREA Apply 1 application topically 3 (three) times daily.  1 Tube  1  . gabapentin (NEURONTIN) 300 MG capsule Take 1 capsule (300 mg total) by mouth 3 (three) times daily.  90 capsule  3  . hydrALAZINE (APRESOLINE) 25 MG tablet Take 1 tablet (25 mg total) by mouth 3 (three) times daily.  90 tablet  1  .  hydrochlorothiazide (HYDRODIURIL) 25 MG tablet Take 25 mg by mouth daily.      Marland Kitchen HYDROcodone-acetaminophen (NORCO/VICODIN) 5-325 MG per tablet Take 1 tablet by mouth every 6 (six) hours as needed for pain.  30 tablet  5  . hydroxychloroquine (PLAQUENIL) 200 MG tablet Take 1 tablet (200 mg total) by mouth 2 (two) times daily.  60 tablet  1  . levETIRAcetam (KEPPRA) 500 MG tablet TAKE ONE TABLET TWICE DAILY  60 tablet  11  . loratadine (CLARITIN) 10 MG tablet Take 1 tablet (10 mg total) by mouth daily.  30 tablet  3  . metoCLOPramide (REGLAN) 5 MG tablet TAKE ONE TABLET FOUR TIMES DAILY  120 tablet  6  . metoprolol (LOPRESSOR) 50 MG tablet TAKE ONE TABLET TWICE DAILY  180 tablet  3  .  mometasone (NASONEX) 50 MCG/ACT nasal spray 2 spray in each nostril daily in the morning  17 g  0  . omeprazole (PRILOSEC) 20 MG capsule TAKE ONE (1) CAPSULE EACH DAY  31 capsule  11  . promethazine (PHENERGAN) 12.5 MG tablet Take 12.5 mg by mouth every 8 (eight) hours as needed for nausea.       Marland Kitchen venlafaxine XR (EFFEXOR-XR) 75 MG 24 hr capsule TAKE ONE (1) CAPSULE EACH DAY  31 capsule  11  . zolpidem (AMBIEN) 5 MG tablet Take 1 tablet (5 mg total) by mouth at bedtime as needed. For insomnia  30 tablet  3   No current facility-administered medications for this visit.   Family History  Problem Relation Age of Onset  . Heart attack Brother 66   History   Social History  . Marital Status: Widowed    Spouse Name: N/A    Number of Children: N/A  . Years of Education: N/A   Social History Main Topics  . Smoking status: Former Smoker -- 1.50 packs/day    Types: Cigarettes    Quit date: 05/30/2003  . Smokeless tobacco: Never Used     Comment: she smoked for 30 years before she quit  . Alcohol Use: No  . Drug Use: No  . Sexual Activity: None   Other Topics Concern  . None   Social History Narrative   Has a home nurse,    Lives with her 2 sons,    Former smoker   Widow         Review of Systems: Constitutional: Denies fever, chills, diaphoresis, appetite change and fatigue.  HEENT: Denies photophobia, eye pain, redness, hearing loss, ear pain, congestion, sore throat, rhinorrhea, sneezing, mouth sores, trouble swallowing, neck pain, neck stiffness and tinnitus.   Respiratory: +SOB and wheezing, improved from last clinic visit  Cardiovascular: + chronic BLE edema, improved per caregiver Denies chest pain, palpitations.  Gastrointestinal: Denies nausea, vomiting, abdominal pain, diarrhea, blood in stool and abdominal distention. +occasional constipation Genitourinary: Denies dysuria, urgency, frequency, hematuria, flank pain and difficulty urinating.  Musculoskeletal: +chronic  RLE pain, unchanged.  Skin: Denies pallor, rash and wound.  Neurological: Denies dizziness, seizures, syncope, weakness, light-headedness, numbness and headaches.  Psychiatric/Behavioral: Denies suicidal ideation, mood changes, confusion, nervousness, sleep disturbance and agitation  Objective:  Physical Exam: Filed Vitals:   02/16/13 1440  BP: 128/79  Pulse: 120  Temp: 97.7 F (36.5 C)  TempSrc: Oral  SpO2: 96%   Constitutional: Vital signs reviewed.  Patient is a well-developed and well-nourished female in no acute distress and cooperative with exam. Alert.  Head: Normocephalic and atraumatic Eyes: PERRL, EOMI, conjunctivae normal, No  scleral icterus.  Neck: Supple, Trachea midline Cardiovascular: RRR, S1 normal, S2 normal, no MRG, pulses symmetric and intact bilaterally Pulmonary/Chest: normal respiratory effort, CTAB, no wheezes, rales, or rhonchi Abdominal: Soft. Non-tender, non-distended, bowel sounds are normal, no masses, organomegaly, or guarding present.  Musculoskeletal: Mild non-pitting edema of BLE. RUE contracted against the chest Neurological: A&O to person. R hemiparesis with expressive aphasia Skin: Warm, dry and intact. No rash, cyanosis, or clubbing.  Psychiatric: Normal mood and affect.   Assessment & Plan:   Please refer to Problem List based Assessment and Plan

## 2013-02-20 NOTE — Telephone Encounter (Signed)
Was seen in clinic 02/06/13 - Dr Clyde Lundborg.

## 2013-02-20 NOTE — Progress Notes (Signed)
Case discussed with Dr. Glenn at the time of the visit. We reviewed the resident's history and exam and pertinent patient test results. I agree with the assessment, diagnosis and plan of care documented in the resident's note. 

## 2013-03-09 ENCOUNTER — Other Ambulatory Visit: Payer: Self-pay | Admitting: Internal Medicine

## 2013-03-09 NOTE — Telephone Encounter (Signed)
Wanda [name] needs to be seen in the clinic before having this refilled. Please call her and try to get her into the clinic sometime very soon. She was supposed to f/u in 2 weeks from her appt on 10/2 but did not schedule the appt. She will need labs then too to recheck her potassium. Thanks!

## 2013-03-09 NOTE — Telephone Encounter (Signed)
An appt has been scheduled on Oct 28.

## 2013-03-14 ENCOUNTER — Telehealth: Payer: Self-pay | Admitting: *Deleted

## 2013-03-14 ENCOUNTER — Ambulatory Visit (INDEPENDENT_AMBULATORY_CARE_PROVIDER_SITE_OTHER): Payer: Medicare Other | Admitting: Internal Medicine

## 2013-03-14 ENCOUNTER — Encounter: Payer: Self-pay | Admitting: Internal Medicine

## 2013-03-14 VITALS — BP 134/80 | HR 98 | Temp 97.5°F | Ht 71.0 in | Wt 201.6 lb

## 2013-03-14 DIAGNOSIS — M329 Systemic lupus erythematosus, unspecified: Secondary | ICD-10-CM

## 2013-03-14 DIAGNOSIS — Z8679 Personal history of other diseases of the circulatory system: Secondary | ICD-10-CM | POA: Diagnosis not present

## 2013-03-14 DIAGNOSIS — J45909 Unspecified asthma, uncomplicated: Secondary | ICD-10-CM | POA: Diagnosis not present

## 2013-03-14 DIAGNOSIS — M810 Age-related osteoporosis without current pathological fracture: Secondary | ICD-10-CM | POA: Diagnosis not present

## 2013-03-14 DIAGNOSIS — I10 Essential (primary) hypertension: Secondary | ICD-10-CM

## 2013-03-14 DIAGNOSIS — R110 Nausea: Secondary | ICD-10-CM

## 2013-03-14 LAB — BASIC METABOLIC PANEL WITH GFR
BUN: 18 mg/dL (ref 6–23)
CO2: 30 mEq/L (ref 19–32)
Chloride: 99 mEq/L (ref 96–112)
GFR, Est African American: >89 mL/min
GFR, Est Non African American: 89 mL/min
Potassium: 3.9 mEq/L (ref 3.5–5.3)
Sodium: 141 mEq/L (ref 135–145)

## 2013-03-14 MED ORDER — PROMETHAZINE HCL 12.5 MG PO TABS: 12.5000 mg | ORAL_TABLET | Freq: Three times a day (TID) | ORAL | Status: DC | PRN

## 2013-03-14 MED ORDER — ALBUTEROL SULFATE (5 MG/ML) 0.5% IN NEBU: 2.5000 mg | INHALATION_SOLUTION | RESPIRATORY_TRACT | Status: DC | PRN

## 2013-03-14 NOTE — Telephone Encounter (Signed)
RTC to Wanda [name] from [facility].  Pt has been rescheduled for 03/20/2013 at 9:00 AM to 8:45 AM. Pt was called spoke with Janie at [phone] to inform her that pt has an appointment with [facility].   Angelina Ok, RN 03/14/2013 1:36 PM.

## 2013-03-14 NOTE — Telephone Encounter (Signed)
Call to [facility] to reschedule appointment that pt had on 02/20/2013.   Spoke with patient and caregiver pt cancelled appointment since pt has a bone doctor.  Explained that this referral was for her Lupus.  Message was left on Wendy's voice mail to call to see if pt could be rescheduled with an appointment.  Angelina Ok, RN 03/14/2013 11:14 AM

## 2013-03-14 NOTE — Assessment & Plan Note (Addendum)
The patient's caregiver is requesting a new wheelchair for the patient; she states that the pt has had the same wheelchair for over 5 years.  Does the patient require and use a wheelchair to move around in the home?  yes  Does the patient have quadriplegia or a fixed hip angle?  She has right hemiplegia   Does the patient have a trunk or brace cast or other brace that requires a reclining back feature for positioning? no  Does the patient have excessive extensor tone of the trunk muscles?  not applicable  Does the patient need to rest in a recumbent position two or more times a day? Some days, but usually no  The patient has the following condition(s) that prevent a 90 degree flexion of the knee: No  Does the patient have a need for arm height different than available using non-adjustable arms? yes  How many hours per day does the patient usually spend in the  wheelchair? ( round up to the next hour )  8-12 hours.  Is the patient able to adequately self-propel in the standard weight wheelchair?  Yes, but she does require assistance at times as well  If not, would the patient be able to adequately self propel in the  wheelchair being ordered?  not applicable  The answers to the above questions were provided by:  Provider and patient's care giver  **[facility] was contacted during the pt's clinic visit and the pt received a new chair in 2012 and her insurance will not pay for a new wheelchair at this time. The pt and her caregiver were notified.

## 2013-03-14 NOTE — Progress Notes (Signed)
Patient ID: Lauralyn [name], female   DOB: 01/05/1956, 57 y.o.   MRN: [id number]  Subjective:   Patient ID: Wanda [name] female   DOB: April 29, 1956 57 y.o.   MRN: [id number]  HPI: [name] is a 57 y.o. 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  She was seen on 10/2 in the clinic, where the amlodipine was discontinued due to concerns that her blood pressure was too well controlled causing tachycardia despite being on a beta blocker.   At her clinic visit on 10/2, I also discussed with the pt and her caregiver about the pt being on Fosamax for about 6ys, per caregiver. We needed to order a DEXA scan, but the pt's caregiver had to leave the appt early, taking the pt with her. Today and discussing the patient's osteoporosis, caregiver states that Dr. Albertha Ghee, a sports medicine physician, is managing the patient's osteoporosis. She states the patient recently had a DEXA scan, unfortunately we do not have those records here.  She was supposed to have been seen by Rheumatology on 10/16 for her SLE. However, patient's caregiver canceled appointment, because she thought that Dr. Cleophas Dunker and the rheumatologist work in the same specialty, and was concerned that the insurance would not cover the cost of seeing both physicians and canceled the rheumatology appointment.    Past Medical History  Diagnosis Date  . Depression   . GERD (gastroesophageal reflux disease)   . Hypertension   . 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 Stroke     ruptured berry aneurysm, right hemiparesis and expressive aphasia  . SLE (systemic lupus erythematosus)   . Venous insufficiency (chronic) (peripheral)   . Osteopenia   . ANEMIA-IRON DEFICIENCY 03/02/2006    H&H 8.5/25.9 5/09. On 5/13 12.8/39.7 with MCV 80.4.     Marland Kitchen CEREBROVASCULAR ACCIDENT, HX OF 03/02/2006   Ruptured berry aneurysm, right hemiparesis & expressive aphasia      Current Outpatient Prescriptions  Medication Sig Dispense Refill  . albuterol (PROVENTIL) (5 MG/ML) 0.5% nebulizer solution Take 0.5 mLs (2.5 mg total) by nebulization every 4 (four) hours as needed. For shortness of breath  20 mL  3  . alendronate (FOSAMAX) 70 MG tablet Take 70 mg by mouth every 7 (seven) days. Tuesday. Take with a full glass of water on an empty stomach.      Marland Kitchen aspirin 81 MG tablet Take 81 mg by mouth daily.        . baclofen (LIORESAL) 10 MG tablet       . calcium-vitamin D (OSCAL) 250-125 MG-UNIT per tablet Take 1 tablet by mouth daily.        . Capsaicin-Menthol-Methyl Sal (CAPSAICIN-METHYL SAL-MENTHOL) 0.025-1-12 % CREA Apply 1 application topically 3 (three) times daily.  1 Tube  1  . gabapentin (NEURONTIN) 300 MG capsule Take 1 capsule (300 mg total) by mouth 3 (three) times daily.  90 capsule  3  . hydrALAZINE (APRESOLINE) 25 MG tablet Take 1 tablet (25 mg total) by mouth 3 (three) times daily.  90 tablet  1  . hydrochlorothiazide (HYDRODIURIL) 25 MG tablet Take 25 mg by mouth daily.      Marland Kitchen HYDROcodone-acetaminophen (NORCO/VICODIN) 5-325 MG per tablet Take 1 tablet by mouth every 6 (six) hours as needed for pain.  30 tablet  5  . hydroxychloroquine (PLAQUENIL)  200 MG tablet Take 1 tablet (200 mg total) by mouth 2 (two) times daily.  60 tablet  1  . levETIRAcetam (KEPPRA) 500 MG tablet TAKE ONE TABLET TWICE DAILY  60 tablet  11  . loratadine (CLARITIN) 10 MG tablet Take 1 tablet (10 mg total) by mouth daily.  30 tablet  3  . metoCLOPramide (REGLAN) 5 MG tablet TAKE ONE TABLET FOUR TIMES DAILY  120 tablet  6  . metoprolol (LOPRESSOR) 50 MG tablet TAKE ONE TABLET TWICE DAILY  180 tablet  3  . mometasone (NASONEX) 50 MCG/ACT nasal spray 2 spray in each nostril daily in the morning  17 g  0  . omeprazole (PRILOSEC) 20 MG capsule TAKE ONE (1) CAPSULE EACH DAY  31 capsule  11  . promethazine (PHENERGAN) 12.5  MG tablet Take 12.5 mg by mouth every 8 (eight) hours as needed for nausea.       Marland Kitchen venlafaxine XR (EFFEXOR-XR) 75 MG 24 hr capsule TAKE ONE (1) CAPSULE EACH DAY  31 capsule  11  . zolpidem (AMBIEN) 5 MG tablet Take 1 tablet (5 mg total) by mouth at bedtime as needed. For insomnia  30 tablet  3   No current facility-administered medications for this visit.   Family History  Problem Relation Age of Onset  . Heart attack Brother 65   History   Social History  . Marital Status: Widowed    Spouse Name: N/A    Number of Children: N/A  . Years of Education: N/A   Social History Main Topics  . Smoking status: Former Smoker -- 1.50 packs/day    Types: Cigarettes    Quit date: 05/30/2003  . Smokeless tobacco: Never Used     Comment: she smoked for 30 years before she quit  . Alcohol Use: No  . Drug Use: No  . Sexual Activity: None   Other Topics Concern  . None   Social History Narrative   Has a home nurse,    Lives with her 2 sons,    Former smoker   Widow         Review of Systems: Constitutional: Denies fever, chills, appetite change or fatigue.  Respiratory: Denies SOB, DOE, cough, chest tightness. +wheezing, intermittent   Cardiovascular: Denies chest pain. + intermittent leg swelling.  Musculoskeletal: +difficulty ambulating, requires wheel chair primarily.  Neurological: +R sided hemiparesis. Denies dizziness, seizures, syncope Psychiatric/Behavioral: Denies suicidal ideation, mood changes, confusion, nervousness, sleep disturbance and agitation  Objective:  Physical Exam: Filed Vitals:   03/14/13 1034  BP: 134/80  Pulse: 98  Temp: 97.5 F (36.4 C)  TempSrc: Oral  Height: 5\' 11"  (1.803 m)  Weight: 201 lb 9.6 oz (91.445 kg)  SpO2: 99%   Constitutional: Vital signs reviewed.  Patient is a well-developed and well-nourished female in no acute distress and cooperative with exam. Head: Normocephalic and atraumatic Eyes: PERRL, EOMI, conjunctivae normal, No  scleral icterus.   Cardiovascular: RRR, no MRG. No lower extremity edema Pulmonary/Chest: normal respiratory effort, CTAB, no wheezes, rales, or rhonchi Abdominal: Soft. Non-tender, non-distended Musculoskeletal: RUE contracted against the chest  Neurological: A&O to person and place. R hemiparesis with expressive aphasia Skin: Warm, dry and intact. No rash, cyanosis, or clubbing.  Psychiatric: Normal mood and affect.  Assessment & Plan:   Please refer to Problem List based Assessment and Plan

## 2013-03-14 NOTE — Assessment & Plan Note (Signed)
Patient is to be seen by rheumatology on 10/16, but her aide canceled the appointment, because the patient was 32 seeing a bone doctor, Dr. Albertha Ghee. However Dr. Cleophas Dunker is a sports medicine physician and not rheumatologist. This was explained to the patient and her aide, we are working on time to reschedule her for rheumatology. She continues to take the Plaquenil.

## 2013-03-14 NOTE — Assessment & Plan Note (Signed)
Per the patient's aide, she is followed for her osteoporosis by Dr. Albertha Ghee. Dr. Cleophas Dunker is the one who prescribes the Fosamax. Per the patient's aide, Ms. Khachatryan has recently had a DEXA scan. Recalling the clinic today to get the results of this, and other medical records from Dr. Cleophas Dunker.

## 2013-03-14 NOTE — Assessment & Plan Note (Addendum)
BP Readings from Last 3 Encounters:  03/14/13 134/80  02/16/13 128/79  02/09/13 146/72    Lab Results  Component Value Date   NA 135 02/06/2013   K 3.1* 02/06/2013   CREATININE 0.52 02/06/2013    Assessment: Blood pressure control: controlled Progress toward BP goal:  at goal Comments: While she is still tachycardic today, her pulse rate is improved significantly and her BP remains controlled.  Plan: Medications:  continue current medications Educational resources provided: brochure Self management tools provided:   Other plans: Checking BMP today. F/u in 3 mo

## 2013-03-16 NOTE — Progress Notes (Signed)
Case discussed with Dr. Glenn soon after the resident saw the patient.  We reviewed the resident's history and exam and pertinent patient test results.  I agree with the assessment, diagnosis, and plan of care documented in the resident's note. 

## 2013-03-17 ENCOUNTER — Other Ambulatory Visit: Payer: Self-pay

## 2013-03-17 ENCOUNTER — Inpatient Hospital Stay (HOSPITAL_COMMUNITY)
Admission: EM | Admit: 2013-03-17 | Discharge: 2013-03-19 | DRG: 309 | Disposition: A | Discharge status: Home / Self Care | Payer: Medicare Other | Attending: Internal Medicine | Admitting: Internal Medicine

## 2013-03-17 ENCOUNTER — Encounter (HOSPITAL_COMMUNITY): Payer: Self-pay | Admitting: Emergency Medicine

## 2013-03-17 ENCOUNTER — Emergency Department (HOSPITAL_COMMUNITY): Payer: Medicare Other

## 2013-03-17 DIAGNOSIS — K219 Gastro-esophageal reflux disease without esophagitis: Secondary | ICD-10-CM | POA: Diagnosis present

## 2013-03-17 DIAGNOSIS — M329 Systemic lupus erythematosus, unspecified: Secondary | ICD-10-CM | POA: Diagnosis present

## 2013-03-17 DIAGNOSIS — F3289 Other specified depressive episodes: Secondary | ICD-10-CM | POA: Diagnosis present

## 2013-03-17 DIAGNOSIS — I69920 Aphasia following unspecified cerebrovascular disease: Secondary | ICD-10-CM

## 2013-03-17 DIAGNOSIS — I10 Essential (primary) hypertension: Secondary | ICD-10-CM | POA: Diagnosis not present

## 2013-03-17 DIAGNOSIS — G40909 Epilepsy, unspecified, not intractable, without status epilepticus: Secondary | ICD-10-CM | POA: Diagnosis present

## 2013-03-17 DIAGNOSIS — F329 Major depressive disorder, single episode, unspecified: Secondary | ICD-10-CM | POA: Diagnosis present

## 2013-03-17 DIAGNOSIS — R079 Chest pain, unspecified: Secondary | ICD-10-CM | POA: Diagnosis not present

## 2013-03-17 DIAGNOSIS — R069 Unspecified abnormalities of breathing: Secondary | ICD-10-CM | POA: Diagnosis not present

## 2013-03-17 DIAGNOSIS — I872 Venous insufficiency (chronic) (peripheral): Secondary | ICD-10-CM | POA: Diagnosis present

## 2013-03-17 DIAGNOSIS — J45909 Unspecified asthma, uncomplicated: Secondary | ICD-10-CM | POA: Diagnosis present

## 2013-03-17 DIAGNOSIS — Z7982 Long term (current) use of aspirin: Secondary | ICD-10-CM | POA: Diagnosis not present

## 2013-03-17 DIAGNOSIS — D509 Iron deficiency anemia, unspecified: Secondary | ICD-10-CM | POA: Diagnosis not present

## 2013-03-17 DIAGNOSIS — R569 Unspecified convulsions: Secondary | ICD-10-CM | POA: Diagnosis present

## 2013-03-17 DIAGNOSIS — I69959 Hemiplegia and hemiparesis following unspecified cerebrovascular disease affecting unspecified side: Secondary | ICD-10-CM

## 2013-03-17 DIAGNOSIS — I359 Nonrheumatic aortic valve disorder, unspecified: Secondary | ICD-10-CM | POA: Diagnosis not present

## 2013-03-17 DIAGNOSIS — I639 Cerebral infarction, unspecified: Secondary | ICD-10-CM

## 2013-03-17 DIAGNOSIS — I471 Supraventricular tachycardia, unspecified: Secondary | ICD-10-CM | POA: Diagnosis present

## 2013-03-17 DIAGNOSIS — E876 Hypokalemia: Secondary | ICD-10-CM | POA: Diagnosis not present

## 2013-03-17 DIAGNOSIS — I498 Other specified cardiac arrhythmias: Secondary | ICD-10-CM | POA: Diagnosis not present

## 2013-03-17 DIAGNOSIS — Z79899 Other long term (current) drug therapy: Secondary | ICD-10-CM | POA: Diagnosis not present

## 2013-03-17 DIAGNOSIS — I059 Rheumatic mitral valve disease, unspecified: Secondary | ICD-10-CM | POA: Diagnosis present

## 2013-03-17 DIAGNOSIS — R0789 Other chest pain: Secondary | ICD-10-CM | POA: Diagnosis present

## 2013-03-17 DIAGNOSIS — R7989 Other specified abnormal findings of blood chemistry: Secondary | ICD-10-CM | POA: Diagnosis present

## 2013-03-17 DIAGNOSIS — Z87891 Personal history of nicotine dependence: Secondary | ICD-10-CM | POA: Diagnosis not present

## 2013-03-17 DIAGNOSIS — J9819 Other pulmonary collapse: Secondary | ICD-10-CM | POA: Diagnosis not present

## 2013-03-17 HISTORY — DX: Supraventricular tachycardia, unspecified: I47.10

## 2013-03-17 HISTORY — DX: Supraventricular tachycardia: I47.1

## 2013-03-17 LAB — CBC WITH DIFFERENTIAL/PLATELET
Basophils Relative: 0 % (ref 0–1)
Eosinophils Absolute: 0.1 10*3/uL (ref 0.0–0.7)
Eosinophils Relative: 1 % (ref 0–5)
Lymphs Abs: 2 10*3/uL (ref 0.7–4.0)
MCH: 24.5 pg — ABNORMAL LOW (ref 26.0–34.0)
MCV: 78.1 fL (ref 78.0–100.0)
Neutro Abs: 6.1 10*3/uL (ref 1.7–7.7)
Platelets: 580 10*3/uL — ABNORMAL HIGH (ref 150–400)
RBC: 4.94 MIL/uL (ref 3.87–5.11)

## 2013-03-17 LAB — COMPREHENSIVE METABOLIC PANEL
AST: 20 U/L (ref 0–37)
Albumin: 3.8 g/dL (ref 3.5–5.2)
Alkaline Phosphatase: 85 U/L (ref 39–117)
BUN: 17 mg/dL (ref 6–23)
Calcium: 10.1 mg/dL (ref 8.4–10.5)
GFR calc Af Amer: >90 mL/min (ref >90)
Glucose, Bld: 165 mg/dL — ABNORMAL HIGH (ref 70–99)
Potassium: 3.5 mEq/L (ref 3.5–5.1)
Sodium: 138 mEq/L (ref 135–145)
Total Protein: 8.7 g/dL — ABNORMAL HIGH (ref 6.0–8.3)

## 2013-03-17 LAB — POCT I-STAT TROPONIN I: Troponin i, poc: 0.11 ng/mL (ref 0.00–0.08)

## 2013-03-17 MED ORDER — SODIUM CHLORIDE 0.9 % IV BOLUS (SEPSIS)
1000.0000 mL | Freq: Once | INTRAVENOUS | Status: AC
  Administered 2013-03-17: 1000 mL via INTRAVENOUS

## 2013-03-17 NOTE — ED Notes (Signed)
Patient transported to X-ray 

## 2013-03-17 NOTE — ED Notes (Signed)
Per EMS, pt is at baseline cognitively.  Pt complaining of Nausea and substernal chest pain.  Multiple IV attempts by EMS without success.  Pt no able to communicate in sentences, but is able to answer yes, no questions and very short one word statements.

## 2013-03-17 NOTE — ED Notes (Signed)
6 mg of adenosine given IV fast push followed by rapid 20 ml iv saline flush

## 2013-03-17 NOTE — ED Provider Notes (Signed)
CSN: [id number]     Arrival date & time 03/17/13  2139 History   First MD Initiated Contact with Patient 03/17/13 2148     Chief Complaint  Patient presents with  . Chest Pain  . Tachycardia   (Consider location/radiation/quality/duration/timing/severity/associated sxs/prior Treatment) Patient is a 57 y.o. female presenting with shortness of breath. The history is provided by the patient. History limited by: Pt is non-verbal with expressive aphasia.  Shortness of Breath Severity:  Mild Onset quality:  Sudden Duration:  1 hour Timing:  Constant Progression:  Unchanged Chronicity:  New Context comment:  At rest Relieved by:  Nothing Worsened by:  Nothing tried Ineffective treatments:  None tried Associated symptoms: chest pain   Associated symptoms: no abdominal pain, no cough, no fever, no headaches, no neck pain and no vomiting     Past Medical History  Diagnosis Date  . Depression   . GERD (gastroesophageal reflux disease)   . Hypertension   . 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 Stroke     ruptured berry aneurysm, right hemiparesis and expressive aphasia  . SLE (systemic lupus erythematosus)   . Venous insufficiency (chronic) (peripheral)   . Osteopenia   . ANEMIA-IRON DEFICIENCY 03/02/2006    H&H 8.5/25.9 5/09. On 5/13 12.8/39.7 with MCV 80.4.     Marland Kitchen CEREBROVASCULAR ACCIDENT, HX OF 03/02/2006    Ruptured berry aneurysm, right hemiparesis & expressive aphasia      Past Surgical History  Procedure Laterality Date  . Total abdominal hysterectomy  5/09    Benign cervix, uterine fibroids on bx  . Cerebral aneurysm repair      1990's  . Esophagogastroduodenoscopy  2001    Varices and esophagitis   Family History  Problem Relation Age of Onset  . Heart attack Brother 60   History  Substance Use Topics  . Smoking status: Former Smoker -- 1.50 packs/day    Types: Cigarettes    Quit date: 05/30/2003  . Smokeless tobacco: Never Used     Comment:  she smoked for 30 years before she quit  . Alcohol Use: No   OB History   Grav Para Term Preterm Abortions TAB SAB Ect Mult Living                 Review of Systems  Constitutional: Negative for fever and fatigue.  HENT: Negative for congestion and drooling.   Eyes: Negative for pain.  Respiratory: Positive for shortness of breath. Negative for cough.   Cardiovascular: Positive for chest pain.  Gastrointestinal: Negative for nausea, vomiting, abdominal pain and diarrhea.  Genitourinary: Negative for dysuria and hematuria.  Musculoskeletal: Negative for back pain, gait problem and neck pain.  Skin: Negative for color change.  Neurological: Negative for dizziness and headaches.  Hematological: Negative for adenopathy.  Psychiatric/Behavioral: Negative for behavioral problems.  All other systems reviewed and are negative.    Allergies  Lasix; Penicillins; and Sulfur  Home Medications   Current Outpatient Rx  Name  Route  Sig  Dispense  Refill  . albuterol (PROVENTIL) (5 MG/ML) 0.5% nebulizer solution   Nebulization   Take 0.5 mLs (2.5 mg total) by nebulization every 4 (four) hours as needed. For shortness of breath   20 mL   3   . Cholecalciferol (REPLESTA) 50000 UNITS WAFR   Oral   Take 50,000 Units by mouth every 7 (seven) days. On wednesdays         . furosemide (LASIX) 20  MG tablet   Oral   Take 20 mg by mouth daily.         . hydrALAZINE (APRESOLINE) 25 MG tablet   Oral   Take 1 tablet (25 mg total) by mouth 3 (three) times daily.   90 tablet   1   . hydrochlorothiazide (HYDRODIURIL) 25 MG tablet   Oral   Take 25 mg by mouth daily.         Marland Kitchen KLOR-CON 10 10 MEQ tablet   Oral   Take 10 mEq by mouth daily.          Marland Kitchen levETIRAcetam (KEPPRA) 500 MG tablet      TAKE ONE TABLET TWICE DAILY   60 tablet   11     PLEASE RESPOND   . loratadine (CLARITIN) 10 MG tablet   Oral   Take 1 tablet (10 mg total) by mouth daily.   30 tablet   3   .  metoprolol (LOPRESSOR) 50 MG tablet      TAKE ONE TABLET TWICE DAILY   180 tablet   3     PLEASE RESPOND   . omeprazole (PRILOSEC) 20 MG capsule      TAKE ONE (1) CAPSULE EACH DAY   31 capsule   11     PLEASE RESPOND   . promethazine (PHENERGAN) 12.5 MG tablet   Oral   Take 1 tablet (12.5 mg total) by mouth every 8 (eight) hours as needed for nausea.   45 tablet   2   . venlafaxine XR (EFFEXOR-XR) 75 MG 24 hr capsule      TAKE ONE (1) CAPSULE EACH DAY   31 capsule   11     PLEASE RESPOND   . zolpidem (AMBIEN) 5 MG tablet   Oral   Take 1 tablet (5 mg total) by mouth at bedtime as needed. For insomnia   30 tablet   3   . aspirin 81 MG tablet   Oral   Take 81 mg by mouth daily.           . baclofen (LIORESAL) 10 MG tablet               . calcium-vitamin D (OSCAL) 250-125 MG-UNIT per tablet   Oral   Take 1 tablet by mouth daily.           . Capsaicin-Menthol-Methyl Sal (CAPSAICIN-METHYL SAL-MENTHOL) 0.025-1-12 % CREA   Apply externally   Apply 1 application topically 3 (three) times daily.   1 Tube   1   . gabapentin (NEURONTIN) 300 MG capsule   Oral   Take 1 capsule (300 mg total) by mouth 3 (three) times daily.   90 capsule   3   . hydroxychloroquine (PLAQUENIL) 200 MG tablet   Oral   Take 1 tablet (200 mg total) by mouth 2 (two) times daily.   60 tablet   1    BP 93/60  Pulse 114  Temp(Src) 97.7 F (36.5 C) (Oral)  Resp 26  SpO2 100% Physical Exam  Nursing note and vitals reviewed. Constitutional: She is oriented to person, place, and time. She appears well-developed and well-nourished.  HENT:  Head: Normocephalic.  Mouth/Throat: Oropharynx is clear and moist. No oropharyngeal exudate.  Eyes: Conjunctivae and EOM are normal. Pupils are equal, round, and reactive to light.  Neck: Normal range of motion. Neck supple.  Cardiovascular: Regular rhythm, normal heart sounds and intact distal pulses.  Exam reveals no gallop and no  friction  rub.   No murmur heard. HR 206  Pulmonary/Chest: Effort normal and breath sounds normal. No respiratory distress. She has no wheezes.  Abdominal: Soft. Bowel sounds are normal. There is no tenderness. There is no rebound and no guarding.  Musculoskeletal: Normal range of motion. She exhibits no edema and no tenderness.  Neurological: She is alert and oriented to person, place, and time.  Skin: Skin is warm and dry.  Psychiatric: She has a normal mood and affect. Her behavior is normal.    ED Course  Procedures (including critical care time) Labs Review Labs Reviewed  CBC WITH DIFFERENTIAL - Abnormal; Notable for the following:    MCH 24.5 (*)    RDW 16.2 (*)    Platelets 580 (*)    All other components within normal limits  COMPREHENSIVE METABOLIC PANEL - Abnormal; Notable for the following:    Glucose, Bld 165 (*)    Total Protein 8.7 (*)    Total Bilirubin 0.2 (*)    GFR calc non Af Amer 80 (*)    All other components within normal limits  POCT I-STAT TROPONIN I - Abnormal; Notable for the following:    Troponin i, poc 0.11 (*)    All other components within normal limits   Imaging Review Dg Chest 2 View  03/17/2013   CLINICAL DATA:  Chest and leg pain, history hypertension, seizure, stroke  EXAM: CHEST  2 VIEW  COMPARISON:  06/02/2012  FINDINGS: External pacing leads.  Chronic elevation of left diaphragm.  Enlargement of cardiac silhouette.  Mediastinal contours are probably normal for the mild degree of rotation.  Chronic left basilar and minimal right basilar atelectasis.  Upper lungs clear.  No pleural effusion or pneumothorax.  Lateral views are limited in diagnostic utility due to underpenetration.  IMPRESSION: Chronic elevation of left diaphragm.  Enlargement of cardiac silhouette.  Bibasilar atelectasis greater on left.   Electronically Signed   By: Ulyses Southward M.D.   On: 03/17/2013 23:26    EKG Interpretation   None       Date: 03/17/2013  Rate: 206  Rhythm:  supraventricular tachycardia (SVT)  QRS Axis: right  Intervals: QT prolonged  ST/T Wave abnormalities: indeterminate  Conduction Disutrbances:none  Narrative Interpretation: SVT  Old EKG Reviewed: changes noted   Date: 03/17/2013  Rate: 116  Rhythm: sinus tachycardia  QRS Axis: right  Intervals: normal  ST/T Wave abnormalities: normal  Conduction Disutrbances:none  Narrative Interpretation: sinus tachycardia, no new ST or T wave changes cw ischemia  Old EKG Reviewed: changes noted   Procedure note: Ultrasound Guided Peripheral IV Ultrasound guided 20 g peripheral 1.88 inch angiocath IV placement performed by me. Indications: Nursing unable to place IV. Details: The left antecubital fossa and upper arm was evaluated with a multifrequency linear probe. Several patent brachial veins are noted. 1 attempts were made to cannulate a left basilic vein under realtime US guidance with successful cannulation of the vein and catheter placement. There is return of non-pulsatile dark red blood. The patient tolerated the procedure well without complications.   CRITICAL CARE Performed by: Purvis Sheffield, S Total critical care time: 30 min Critical care time was exclusive of separately billable procedures and treating other patients. Critical care was necessary to treat or prevent imminent or life-threatening deterioration. Critical care was time spent personally by me on the following activities: development of treatment plan with patient and/or surrogate as well as nursing, discussions with consultants, evaluation of patient's response to treatment,  examination of patient, obtaining history from patient or surrogate, ordering and performing treatments and interventions, ordering and review of laboratory studies, ordering and review of radiographic studies, pulse oximetry and re-evaluation of patient's condition.  MDM   1. SVT (supraventricular tachycardia)   2. Chest pain    10:27 PM 58 y.o.  female presents for sudden onset chest pain and shortness of breath which began approximately one hour ago. Found to be in SVT, HR 206 on my exam, US guided IV placed immediately at the bedside. 6mg  adenosine given once w/ resolution of sx and SVT. Screening labwork sent, IVF, CXR.   Will admit to internal medicine teaching service for obs.   Critical care time documented in this patient who presented w/ a HR of 206 in SVT. The patient required IVF and adenosine 6mg  x 1 for conversion. Critical care time included focused medical decision making, chart review, repeat evaluations w/ close monitoring, discussion with family, consultation with internal medicine for admission.   Junius Argyle, MD 03/18/13 1031

## 2013-03-18 DIAGNOSIS — D509 Iron deficiency anemia, unspecified: Secondary | ICD-10-CM

## 2013-03-18 DIAGNOSIS — R7989 Other specified abnormal findings of blood chemistry: Secondary | ICD-10-CM | POA: Diagnosis not present

## 2013-03-18 DIAGNOSIS — I471 Supraventricular tachycardia: Secondary | ICD-10-CM

## 2013-03-18 DIAGNOSIS — I10 Essential (primary) hypertension: Secondary | ICD-10-CM

## 2013-03-18 DIAGNOSIS — Z8673 Personal history of transient ischemic attack (TIA), and cerebral infarction without residual deficits: Secondary | ICD-10-CM

## 2013-03-18 DIAGNOSIS — R079 Chest pain, unspecified: Secondary | ICD-10-CM | POA: Diagnosis not present

## 2013-03-18 DIAGNOSIS — I359 Nonrheumatic aortic valve disorder, unspecified: Secondary | ICD-10-CM

## 2013-03-18 DIAGNOSIS — I498 Other specified cardiac arrhythmias: Secondary | ICD-10-CM | POA: Diagnosis not present

## 2013-03-18 DIAGNOSIS — I059 Rheumatic mitral valve disease, unspecified: Secondary | ICD-10-CM | POA: Diagnosis not present

## 2013-03-18 DIAGNOSIS — M329 Systemic lupus erythematosus, unspecified: Secondary | ICD-10-CM | POA: Diagnosis not present

## 2013-03-18 DIAGNOSIS — I69959 Hemiplegia and hemiparesis following unspecified cerebrovascular disease affecting unspecified side: Secondary | ICD-10-CM | POA: Diagnosis not present

## 2013-03-18 LAB — URINALYSIS, ROUTINE W REFLEX MICROSCOPIC
Glucose, UA: NEGATIVE mg/dL
Hgb urine dipstick: NEGATIVE
Ketones, ur: NEGATIVE mg/dL
Specific Gravity, Urine: 1.013 (ref 1.005–1.030)
Urobilinogen, UA: 1 mg/dL (ref 0.0–1.0)

## 2013-03-18 LAB — BASIC METABOLIC PANEL
BUN: 18 mg/dL (ref 6–23)
CO2: 27 mEq/L (ref 19–32)
Chloride: 101 mEq/L (ref 96–112)
Creatinine, Ser: 0.71 mg/dL (ref 0.50–1.10)
GFR calc Af Amer: >90 mL/min (ref >90)
Glucose, Bld: 114 mg/dL — ABNORMAL HIGH (ref 70–99)
Potassium: 3.1 mEq/L — ABNORMAL LOW (ref 3.5–5.1)
Sodium: 138 mEq/L (ref 135–145)

## 2013-03-18 LAB — POCT I-STAT TROPONIN I

## 2013-03-18 LAB — TSH: TSH: 2.964 u[IU]/mL (ref 0.350–4.500)

## 2013-03-18 LAB — CBC
HCT: 33.1 % — ABNORMAL LOW (ref 36.0–46.0)
Hemoglobin: 10.4 g/dL — ABNORMAL LOW (ref 12.0–15.0)
MCH: 24.3 pg — ABNORMAL LOW (ref 26.0–34.0)
MCHC: 31.4 g/dL (ref 30.0–36.0)
MCV: 77.3 fL — ABNORMAL LOW (ref 78.0–100.0)
RBC: 4.28 MIL/uL (ref 3.87–5.11)
RDW: 16.3 % — ABNORMAL HIGH (ref 11.5–15.5)

## 2013-03-18 LAB — URINE MICROSCOPIC-ADD ON

## 2013-03-18 LAB — LIPID PANEL
Cholesterol: 183 mg/dL (ref 0–200)
HDL: 48 mg/dL (ref >39)
LDL Cholesterol: 123 mg/dL — ABNORMAL HIGH (ref 0–99)
Triglycerides: 60 mg/dL (ref <150)

## 2013-03-18 LAB — IRON AND TIBC
Saturation Ratios: 5 % — ABNORMAL LOW (ref 20–55)
UIBC: 339 ug/dL (ref 125–400)

## 2013-03-18 LAB — MAGNESIUM: Magnesium: 1.8 mg/dL (ref 1.5–2.5)

## 2013-03-18 LAB — TROPONIN I: Troponin I: 0.71 ng/mL (ref <0.30)

## 2013-03-18 LAB — FOLATE: Folate: >20 ng/mL

## 2013-03-18 LAB — RETICULOCYTES: Retic Count, Absolute: 59.9 10*3/uL (ref 19.0–186.0)

## 2013-03-18 MED ORDER — HYDROXYCHLOROQUINE SULFATE 200 MG PO TABS
200.0000 mg | ORAL_TABLET | Freq: Two times a day (BID) | ORAL | Status: DC
  Administered 2013-03-18 – 2013-03-19 (×4): 200 mg via ORAL
  Filled 2013-03-18 (×5): qty 1

## 2013-03-18 MED ORDER — LEVETIRACETAM 500 MG PO TABS
500.0000 mg | ORAL_TABLET | Freq: Two times a day (BID) | ORAL | Status: DC
  Administered 2013-03-18 – 2013-03-19 (×4): 500 mg via ORAL
  Filled 2013-03-18 (×5): qty 1

## 2013-03-18 MED ORDER — ATORVASTATIN CALCIUM 80 MG PO TABS
80.0000 mg | ORAL_TABLET | Freq: Every day | ORAL | Status: DC
  Administered 2013-03-18 – 2013-03-19 (×2): 80 mg via ORAL
  Filled 2013-03-18 (×2): qty 1

## 2013-03-18 MED ORDER — CALCIUM CARBONATE-VITAMIN D 500-200 MG-UNIT PO TABS
0.5000 | ORAL_TABLET | Freq: Every day | ORAL | Status: DC
  Administered 2013-03-18 – 2013-03-19 (×2): 0.5 via ORAL
  Filled 2013-03-18 (×3): qty 0.5

## 2013-03-18 MED ORDER — MAGNESIUM SULFATE 40 MG/ML IJ SOLN
2.0000 g | Freq: Once | INTRAMUSCULAR | Status: AC
  Administered 2013-03-18: 2 g via INTRAVENOUS
  Filled 2013-03-18: qty 50

## 2013-03-18 MED ORDER — ALBUTEROL SULFATE (5 MG/ML) 0.5% IN NEBU: 2.5000 mg | INHALATION_SOLUTION | RESPIRATORY_TRACT | Status: DC | PRN

## 2013-03-18 MED ORDER — POTASSIUM CHLORIDE CRYS ER 20 MEQ PO TBCR: 40.0000 meq | EXTENDED_RELEASE_TABLET | Freq: Once | ORAL | Status: DC

## 2013-03-18 MED ORDER — PROMETHAZINE HCL 25 MG PO TABS: 12.5000 mg | ORAL_TABLET | Freq: Three times a day (TID) | ORAL | Status: DC | PRN

## 2013-03-18 MED ORDER — HYDROCHLOROTHIAZIDE 25 MG PO TABS
12.5000 mg | ORAL_TABLET | Freq: Every day | ORAL | Status: DC
  Filled 2013-03-18: qty 0.5

## 2013-03-18 MED ORDER — ASPIRIN EC 81 MG PO TBEC
81.0000 mg | DELAYED_RELEASE_TABLET | Freq: Every day | ORAL | Status: DC
  Administered 2013-03-18 – 2013-03-19 (×2): 81 mg via ORAL
  Filled 2013-03-18 (×2): qty 1

## 2013-03-18 MED ORDER — SODIUM CHLORIDE 0.9 % IJ SOLN
3.0000 mL | Freq: Two times a day (BID) | INTRAMUSCULAR | Status: DC
  Administered 2013-03-18 (×2): 3 mL via INTRAVENOUS
  Administered 2013-03-19: 12:00:00 via INTRAVENOUS

## 2013-03-18 MED ORDER — ASPIRIN 81 MG PO TABS: 81.0000 mg | ORAL_TABLET | Freq: Every day | ORAL | Status: DC

## 2013-03-18 MED ORDER — VENLAFAXINE HCL ER 75 MG PO CP24
75.0000 mg | ORAL_CAPSULE | Freq: Every day | ORAL | Status: DC
  Administered 2013-03-18 – 2013-03-19 (×2): 75 mg via ORAL
  Filled 2013-03-18 (×2): qty 1

## 2013-03-18 MED ORDER — METOPROLOL TARTRATE 50 MG PO TABS
75.0000 mg | ORAL_TABLET | Freq: Two times a day (BID) | ORAL | Status: DC
  Administered 2013-03-18 – 2013-03-19 (×4): 75 mg via ORAL
  Filled 2013-03-18 (×5): qty 1

## 2013-03-18 MED ORDER — HEPARIN BOLUS VIA INFUSION
3500.0000 [IU] | Freq: Once | INTRAVENOUS | Status: AC
  Administered 2013-03-18: 3500 [IU] via INTRAVENOUS
  Filled 2013-03-18: qty 3500

## 2013-03-18 MED ORDER — HYDROCHLOROTHIAZIDE 25 MG PO TABS
25.0000 mg | ORAL_TABLET | Freq: Every day | ORAL | Status: DC
  Administered 2013-03-18: 25 mg via ORAL
  Filled 2013-03-18: qty 1

## 2013-03-18 MED ORDER — ENOXAPARIN SODIUM 40 MG/0.4ML ~~LOC~~ SOLN
40.0000 mg | SUBCUTANEOUS | Status: DC
  Administered 2013-03-19: 40 mg via SUBCUTANEOUS
  Filled 2013-03-18 (×2): qty 0.4

## 2013-03-18 MED ORDER — HEPARIN SODIUM (PORCINE) 5000 UNIT/ML IJ SOLN
5000.0000 [IU] | Freq: Three times a day (TID) | INTRAMUSCULAR | Status: DC
  Filled 2013-03-18: qty 1

## 2013-03-18 MED ORDER — GABAPENTIN 300 MG PO CAPS
300.0000 mg | ORAL_CAPSULE | Freq: Three times a day (TID) | ORAL | Status: DC
  Administered 2013-03-18 – 2013-03-19 (×6): 300 mg via ORAL
  Filled 2013-03-18 (×7): qty 1

## 2013-03-18 MED ORDER — ASPIRIN 81 MG PO CHEW
324.0000 mg | CHEWABLE_TABLET | Freq: Once | ORAL | Status: AC
  Administered 2013-03-18: 324 mg via ORAL
  Filled 2013-03-18: qty 4

## 2013-03-18 MED ORDER — CALCIUM-VITAMIN D 250-125 MG-UNIT PO TABS: 1.0000 | ORAL_TABLET | Freq: Every day | ORAL | Status: DC

## 2013-03-18 MED ORDER — GABAPENTIN 300 MG PO CAPS: 300.0000 mg | ORAL_CAPSULE | Freq: Three times a day (TID) | ORAL | Status: DC

## 2013-03-18 MED ORDER — LORATADINE 10 MG PO TABS
10.0000 mg | ORAL_TABLET | Freq: Every day | ORAL | Status: DC
  Administered 2013-03-18 – 2013-03-19 (×2): 10 mg via ORAL
  Filled 2013-03-18 (×2): qty 1

## 2013-03-18 MED ORDER — PANTOPRAZOLE SODIUM 40 MG PO TBEC
40.0000 mg | DELAYED_RELEASE_TABLET | Freq: Every day | ORAL | Status: DC
  Administered 2013-03-18 – 2013-03-19 (×2): 40 mg via ORAL
  Filled 2013-03-18 (×2): qty 1

## 2013-03-18 MED ORDER — POTASSIUM CHLORIDE CRYS ER 20 MEQ PO TBCR
40.0000 meq | EXTENDED_RELEASE_TABLET | ORAL | Status: AC
  Administered 2013-03-18 (×2): 40 meq via ORAL
  Filled 2013-03-18 (×2): qty 2

## 2013-03-18 MED ORDER — HEPARIN (PORCINE) IN NACL 100-0.45 UNIT/ML-% IJ SOLN
1100.0000 [IU]/h | INTRAMUSCULAR | Status: DC
  Administered 2013-03-18: 1100 [IU]/h via INTRAVENOUS
  Filled 2013-03-18: qty 250

## 2013-03-18 NOTE — Progress Notes (Signed)
  Echocardiogram 2D Echocardiogram has been performed.  Wanda [name] FRANCES 03/18/2013, 6:12 PM

## 2013-03-18 NOTE — Progress Notes (Signed)
Subjective: Patient had left chest pain at onset of arrhythmia that had persisted w/o radiation.  She c/o knee pain.  Denies SOB, vomiting.  She is tolerating oral.  She lives at home with her sons.     Objective: Vital signs in last 24 hours: Filed Vitals:   03/18/13 0015 03/18/13 0135 03/18/13 0544 03/18/13 1315  BP: 137/66 131/68 116/60 108/52  Pulse: 119 114 97 70  Temp:  97.6 F (36.4 C) 98.1 F (36.7 C) 97.4 F (36.3 C)  TempSrc:  Oral Oral Oral  Resp: 29 20 20 20   Weight:  197 lb 12 oz (89.7 kg)    SpO2: 100% 98% 98% 100%   Weight change:   Intake/Output Summary (Last 24 hours) at 03/18/13 1643 Last data filed at 03/18/13 1300  Gross per 24 hour  Intake 1060.42 ml  Output    400 ml  Net 660.42 ml   Vitals reviewed. General: resting in bed, NAD HEENT: no scleral icterus Cardiac: RRR, no rubs, murmurs or gallops Pulm: clear to auscultation bilaterally, no wheezes, rales, or rhonchi Abd: soft, nontender, nondistended, BS present Ext: warm and well perfused, trace pedal edema Neuro: alert and oriented X3, expressive aphagia, right hemiparesis s/p stroke, otherwise cranial nerves II-XII grossly intact  Lab Results: Basic Metabolic Panel:  Recent Labs Lab 03/17/13 2231 03/18/13 0230 03/18/13 0500  NA 138  --  138  K 3.5  --  3.1*  CL 96  --  101  CO2 29  --  27  GLUCOSE 165*  --  114*  BUN 17  --  18  CREATININE 0.81  --  0.71  CALCIUM 10.1  --  9.2  MG  --  1.8  --    Liver Function Tests:  Recent Labs Lab 03/17/13 2231  AST 20  ALT 15  ALKPHOS 85  BILITOT 0.2*  PROT 8.7*  ALBUMIN 3.8   CBC:  Recent Labs Lab 03/17/13 2231 03/18/13 0500  WBC 8.7 8.7  NEUTROABS 6.1  --   HGB 12.1 10.4*  HCT 38.6 33.1*  MCV 78.1 77.3*  PLT 580* 471*   Cardiac Enzymes:  Recent Labs Lab 03/18/13 0230 03/18/13 0830 03/18/13 1515  TROPONINI 0.71* 0.48* <0.30   Hemoglobin A1C:  Recent Labs Lab 03/18/13 0230  HGBA1C 6.4*   Fasting Lipid  Panel:  Recent Labs Lab 03/18/13 0500  CHOL 183  HDL 48  LDLCALC 123*  TRIG 60  CHOLHDL 3.8   Thyroid Function Tests: No results found for this basename: TSH, T4TOTAL, FREET4, T3FREE, THYROIDAB,  in the last 168 hours Anemia Panel:  Recent Labs Lab 03/18/13 0500  TIBC 357  IRON 18*  RETICCTPCT 1.4   Urinalysis:  Recent Labs Lab 03/18/13 0339  COLORURINE YELLOW  LABSPEC 1.013  PHURINE 6.0  GLUCOSEU NEGATIVE  HGBUR NEGATIVE  BILIRUBINUR NEGATIVE  KETONESUR NEGATIVE  PROTEINUR NEGATIVE  UROBILINOGEN 1.0  NITRITE POSITIVE*  LEUKOCYTESUR MODERATE*   Misc. Labs: Anemia panel  TSH  Studies/Results: Dg Chest 2 View  03/17/2013   CLINICAL DATA:  Chest and leg pain, history hypertension, seizure, stroke  EXAM: CHEST  2 VIEW  COMPARISON:  06/02/2012  FINDINGS: External pacing leads.  Chronic elevation of left diaphragm.  Enlargement of cardiac silhouette.  Mediastinal contours are probably normal for the mild degree of rotation.  Chronic left basilar and minimal right basilar atelectasis.  Upper lungs clear.  No pleural effusion or pneumothorax.  Lateral views are limited in diagnostic utility due  to underpenetration.  IMPRESSION: Chronic elevation of left diaphragm.  Enlargement of cardiac silhouette.  Bibasilar atelectasis greater on left.   Electronically Signed   By: Ulyses Southward M.D.   On: 03/17/2013 23:26   Medications:  Scheduled Meds: . aspirin EC  81 mg Oral Daily  . atorvastatin  80 mg Oral q1800  . calcium-vitamin D  0.5 tablet Oral Q breakfast  . [START ON 03/19/2013] enoxaparin (LOVENOX) injection  40 mg Subcutaneous Q24H  . gabapentin  300 mg Oral TID  . [START ON 03/19/2013] hydrochlorothiazide  12.5 mg Oral Daily  . hydroxychloroquine  200 mg Oral BID  . levETIRAcetam  500 mg Oral Q12H  . loratadine  10 mg Oral Daily  . metoprolol tartrate  75 mg Oral BID  . pantoprazole  40 mg Oral Daily  . sodium chloride  3 mL Intravenous Q12H  . venlafaxine XR  75  mg Oral Daily   Continuous Infusions:  PRN Meds:.albuterol, promethazine Assessment/Plan: 57 y.o PMH SLE, stroke with new onset SVT now in NSR.   #SVT (supraventricular tachycardia) -Now in NSR -Cards consulted  -dc heparin gtt, Aspirin 81 mg  -will check lipids, Added Lipitor 80 mg qhs -Lopressor increased to 75 mg bid  -monitor via tele  -replace electrolytes prn   #Troponin elevation, resolved  -Resolving likely 2/2 demand ischemia post SVT -Pending Lexiscan, echo  -Heparin d/c'ed per cards rec  -Cards consulted. She has seen [facility] cards in the past   #SLE -Continued home med  -Follow with Dr. Phylliss Bob outpt (Rheum)  #HTN -Monitor BP  -HCTZ decreased to 12.5 mg due to hypoK per cards, increased Lopressor 75 mg bid  #Seizure d/o  -Continue home meds, precautions  #F/E/N -Hypokalemia 3.1 goal >4 given Kdur 40 mg bid today. Trend BMET in the am  -Mag 1.8 goal >2. Given 2 gram bolus  -Soft mechanical diet; NPO after midnight for Lexiscan  #DVT px  -Heparin gtt d/c changed to Lovenox sq   Dispo: Disposition is deferred at this time, awaiting improvement of current medical problems.  Anticipated discharge in approximately 2-3 day(s).   The patient does have a current PCP Genelle Gather, MD) and does need an [facility] hospital follow-up appointment after discharge.  Unknown if transportation limitations that hinder transportation to clinic appointments.  .Services Needed at time of discharge: Y = Yes, Blank = No PT:   OT:   RN:   Equipment:   Other:     LOS: 1 day   Annett Gula, MD [phone] 03/18/2013, 4:43 PM

## 2013-03-18 NOTE — ED Notes (Signed)
Assisted pt. In using bedpan. Helped clean pt and left them with call bell in reach and comfortable.

## 2013-03-18 NOTE — H&P (Signed)
Date: 03/18/2013               Patient Name:  Wanda [name] MRN: [id number]  DOB: January 12, 1956 Age / Sex: 57 y.o., female   PCP: Genelle Gather, MD         Medical Service: Internal Medicine Teaching Service         Attending Physician: Dr. Burns Spain, MD    First Contact: Dr. Aundria Rud Pager: [phone]  Second Contact: Dr. 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 She states that it started about 1 hour prior to arrival at ED. She was recognized to be in SVT and given adenosine with conversion to sinus in ED. She then felt much better and now is denying any complaints. She states that her feet are hurting and she would like something to drink. She denies chest pain earlier in the day or previous days. She denies nausea or vomiting or diarrhea. She denies SOB or new swelling. History was difficult to obtain given the expressive aphasia.  Meds: Current Facility-Administered Medications  Medication Dose Route Frequency Provider Last Rate Last Dose  . albuterol (PROVENTIL) (5 MG/ML) 0.5% nebulizer solution 2.5 mg  2.5 mg Nebulization Q4H PRN Judie Bonus, MD      . aspirin tablet 81 mg  81 mg Oral Daily Judie Bonus, MD      . calcium-vitamin D (OSCAL) 250-125 MG-UNIT per tablet 1 tablet  1 tablet Oral Daily Judie Bonus, MD      . heparin injection 5,000 Units  5,000 Units Subcutaneous Q8H Judie Bonus, MD      . hydrochlorothiazide (HYDRODIURIL) tablet 25 mg  25 mg Oral Daily Judie Bonus, MD      . hydroxychloroquine (PLAQUENIL) tablet 200 mg  200 mg Oral  BID Judie Bonus, MD      . levETIRAcetam (KEPPRA) tablet 500 mg  500 mg Oral Q12H Judie Bonus, MD      . loratadine (CLARITIN) tablet 10 mg  10 mg Oral Daily Judie Bonus, MD      . metoprolol tartrate (LOPRESSOR) tablet 75 mg  75 mg Oral BID Judie Bonus, MD      . pantoprazole (PROTONIX) EC tablet 40 mg  40 mg Oral Daily Judie Bonus, MD      . promethazine (PHENERGAN) tablet 12.5 mg  12.5 mg Oral Q8H PRN Judie Bonus, MD      . sodium chloride 0.9 % injection 3 mL  3 mL Intravenous Q12H Judie Bonus, MD      . venlafaxine XR (EFFEXOR-XR) 24 hr capsule 75 mg  75 mg Oral Daily Judie Bonus, MD        Allergies: Allergies as of 03/17/2013 - Review Complete 03/17/2013  Allergen Reaction Noted  . Lasix [furosemide] Swelling 02/09/2013  . Penicillins Swelling   . Sulfur Itching 06/02/2012   Past Medical History  Diagnosis Date  . Depression   . GERD (gastroesophageal reflux disease)   . Hypertension   . 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 Stroke     ruptured berry aneurysm, right hemiparesis and expressive aphasia  . SLE (systemic lupus erythematosus)   . Venous insufficiency (chronic) (peripheral)   . Osteopenia   . ANEMIA-IRON DEFICIENCY 03/02/2006    H&H 8.5/25.9 5/09. On 5/13 12.8/39.7 with MCV 80.4.     Marland Kitchen CEREBROVASCULAR ACCIDENT, HX OF 03/02/2006    Ruptured berry aneurysm, right hemiparesis & expressive aphasia      Past Surgical History  Procedure Laterality Date  . Total abdominal hysterectomy  5/09    Benign cervix, uterine fibroids on bx  . Cerebral aneurysm repair      1990's  . Esophagogastroduodenoscopy  2001    Varices and esophagitis   Family History  Problem Relation Age of Onset  . Heart attack Brother 48   History   Social History  . Marital Status: Widowed    Spouse Name: N/A    Number of Children: N/A  . Years of Education: N/A   Occupational History  . Not on file.   Social  History Main Topics  . Smoking status: Former Smoker -- 1.50 packs/day    Types: Cigarettes    Quit date: 05/30/2003  . Smokeless tobacco: Never Used     Comment: she smoked for 30 years before she quit  . Alcohol Use: No  . Drug Use: No  . Sexual Activity: Not on file   Other Topics Concern  . Not on file   Social History Narrative   Has a home nurse,    Lives with her 2 sons,    Former smoker   Widow          Review of Systems: Review of systems not obtained due to patient factors.  Physical Exam: Blood pressure 137/66, pulse 119, temperature 98.4 F (36.9 C), temperature source Oral, resp. rate 29, SpO2 100.00%. General: resting in bed, alert and responsive HEENT: PERRL, EOMI, no scleral icterus Cardiac: S1 S2 heard, rate fast, no murmurs heard Pulm: clear to auscultation bilaterally, moving normal volumes of air Abd: soft, nontender, nondistended, BS present Ext: warm and well perfused, minimal trace edema bilaterally, bilateral feet are tender to touch Neuro: alert and oriented X3, expressive aphasia with short sentences and sometimes delay from question to answer, she is unable to fully express her thoughts, strength 5/5 upper and lower extremity on left, patient has right hemiparesis  Lab results: Basic Metabolic Panel:  Recent Labs  64/40/34 2231  NA 138  K 3.5  CL 96  CO2 29  GLUCOSE 165*  BUN 17  CREATININE 0.81  CALCIUM 10.1   Liver Function Tests:  Recent Labs  03/17/13 2231  AST 20  ALT 15  ALKPHOS 85  BILITOT 0.2*  PROT 8.7*  ALBUMIN 3.8   CBC:  Recent Labs  03/17/13 2231  WBC 8.7  NEUTROABS 6.1  HGB 12.1  HCT 38.6  MCV 78.1  PLT 580*   Cardiac Enzymes: POC troponin 0.11 (upper range of normal 0.08)  Imaging results:  Dg Chest 2 View  03/17/2013   CLINICAL DATA:  Chest and leg pain, history hypertension, seizure, stroke  EXAM: CHEST  2 VIEW  COMPARISON:  06/02/2012  FINDINGS: External pacing leads.  Chronic elevation of  left diaphragm.  Enlargement of cardiac silhouette.  Mediastinal contours are probably normal for the mild degree of rotation.  Chronic left basilar and minimal right basilar atelectasis.  Upper lungs clear.  No pleural effusion or pneumothorax.  Lateral views are limited in diagnostic utility due to underpenetration.  IMPRESSION: Chronic elevation of left diaphragm.  Enlargement of cardiac silhouette.  Bibasilar atelectasis greater on left.   Electronically Signed   By: Ulyses Southward M.D.   On: 03/17/2013 23:26    Other results: Initial EKG with SVT and HR in 200s  EKG: unchanged from previous tracings, sinus tachycardia, no ST or t wave changes from previous.  Assessment & Plan by Problem:  SVT (supraventricular tachycardia) - First episode. Could consider repeat ECHO in the AM, checking electrolytes. Not on any medications that should cause SVT although she is on long term PPI which can pre-dispose to hypomagnesemia. She was tachycardic at last clinic visit. Given co-morbidities observing overnight and trending troponins to ensure this has not produced ACS. Will increase metoprolol to 75 mg BID from 50 mg BID. Will check thyroid with TSH.  -Metoprolol 75 mg BID -Fluids in the ED -Checking electrolytes, TSH -Consider ECHO  Elevated Troponin levels - Initial i-stat troponin in ED minimally elevated and second was increasing to 0.32. Will continue to follow and most likely etiology is demand ischemia with HR in 200s for 1 hour. If continues to increase will start heparin drip.  -Trend CE -ASA daily -Observe on telemetry -Repeat EKG in AM for developing changes  ANEMIA-IRON DEFICIENCY - Hg stable today and will recheck anemia panel in the AM. Not on iron at home.  HYPERTENSION - Will hold her hydralazine and continue her HCTZ given that we are increasing her metoprolol. BP stable in ED without hypotension or hypertension. -HCTZ and metoprolol  Systemic lupus erythematosus - Unclear history  but continue plaquenil. She is supposed to see rheumatology soon.  SEIZURE DISORDER - Continue keppra BID and no seizure activity throughout this episode or currently.   CEREBROVASCULAR ACCIDENT, HX OF - Happened with aneurysm rupture and she does have expressive aphasia and R hemiparesis. Stable without new or worsening neurological findings.   DVT ppx - heparin [place] TID  Dispo: Disposition is deferred at this time, awaiting improvement of current medical problems. Anticipated discharge in approximately 1-2 day(s).   The patient does have a current PCP Genelle Gather, MD) and does need an [facility] hospital follow-up appointment after discharge.  The patient does not have transportation limitations that hinder transportation to clinic appointments.  Signed: Judie Bonus, MD 03/18/2013, 1:25 AM

## 2013-03-18 NOTE — H&P (Signed)
  Date: 03/18/2013  Patient name: Wanda [name]  Medical record number: [id number]  Date of birth: 1956-04-19   I have seen and evaluated Wanda [name] and discussed their care with the Residency Team. Wanda [name] was admitted for SVT and was converted with adenosine. Her Trop I increased, to max of 0.71. Her EKG showed transient first degree AV block but no ischemic changes. She has been started on heparin gtt, her BB continued along with her ASA. Will add statin and check FLP panel (last LDL 126 in 2012).   She has SLE which greatly increases CAD risk. This dx was confirmed in 2001 by Dr Phylliss Bob when was admitted with sig SLE complications.   She also had an upper and lower GI bleed at that time. She has had a colon that only sig int hemorrhoids and I could not locate her EGD but D/C summary suggested Mallory Weiss tear.   Assessment and Plan: I have seen and evaluated the patient as outlined above. I agree with the formulated Assessment and Plan as detailed in the residents' admission note, with the following changes:   1. ACS / NSTEMI / SVT - unknown which came first. Cont ASA, heparin, BB, and add statin. Due to greatly increased risk 2/2 SLE needs ischemic W/U - consult cards. Now in sinus. Correct electrolytes.  2. H/O UGI and LGI bleed - in 2001 so remote. But on Heparin so follow HgB.  3. SLE - cont plaquenil.   Burns Spain, MD 11/1/201411:16 AM

## 2013-03-18 NOTE — Progress Notes (Signed)
ANTICOAGULATION CONSULT NOTE - Initial Consult  Pharmacy Consult for heparin Indication: chest pain/ACS  Allergies  Allergen Reactions  . Lasix [Furosemide] Swelling    mild L facial swelling  . Penicillins Swelling  . Sulfur Itching    Patient Measurements: Weight: 197 lb 12 oz (89.7 kg) Heparin Dosing Weight: 77 kg  Vital Signs: Temp: 97.6 F (36.4 C) (11/01 0135) Temp src: Oral (11/01 0135) BP: 131/68 mmHg (11/01 0135) Pulse Rate: 114 (11/01 0135)  Labs:  Recent Labs  03/17/13 2231 03/18/13 0230  HGB 12.1  --   HCT 38.6  --   PLT 580*  --   CREATININE 0.81  --   TROPONINI  --  0.71*    The CrCl is unknown because both a height and weight (above a minimum accepted value) are required for this calculation.   Medical History: Past Medical History  Diagnosis Date  . Depression   . GERD (gastroesophageal reflux disease)   . Hypertension   . 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 Stroke     ruptured berry aneurysm, right hemiparesis and expressive aphasia  . SLE (systemic lupus erythematosus)   . Venous insufficiency (chronic) (peripheral)   . Osteopenia   . ANEMIA-IRON DEFICIENCY 03/02/2006    H&H 8.5/25.9 5/09. On 5/13 12.8/39.7 with MCV 80.4.     Marland Kitchen CEREBROVASCULAR ACCIDENT, HX OF 03/02/2006    Ruptured berry aneurysm, right hemiparesis & expressive aphasia       Medications:  Prescriptions prior to admission  Medication Sig Dispense Refill  . albuterol (PROVENTIL) (5 MG/ML) 0.5% nebulizer solution Take 0.5 mLs (2.5 mg total) by nebulization every 4 (four) hours as needed. For shortness of breath  20 mL  3  . aspirin 81 MG tablet Take 81 mg by mouth daily.        . calcium-vitamin D (OSCAL) 250-125 MG-UNIT per tablet Take 1 tablet by mouth daily.        . Cholecalciferol (REPLESTA) 50000 UNITS WAFR Take 50,000 Units by mouth every 7 (seven) days. On wednesdays      . furosemide (LASIX) 20 MG tablet Take 20 mg by mouth daily.      .  hydrALAZINE (APRESOLINE) 25 MG tablet Take 1 tablet (25 mg total) by mouth 3 (three) times daily.  90 tablet  1  . hydrochlorothiazide (HYDRODIURIL) 25 MG tablet Take 25 mg by mouth daily.      . hydroxychloroquine (PLAQUENIL) 200 MG tablet Take 1 tablet (200 mg total) by mouth 2 (two) times daily.  60 tablet  1  . KLOR-CON 10 10 MEQ tablet Take 10 mEq by mouth daily.       Marland Kitchen levETIRAcetam (KEPPRA) 500 MG tablet Take 500 mg by mouth every 12 (twelve) hours.      Marland Kitchen loratadine (CLARITIN) 10 MG tablet Take 1 tablet (10 mg total) by mouth daily.  30 tablet  3  . metoprolol (LOPRESSOR) 50 MG tablet Take 50 mg by mouth 2 (two) times daily.      . Multiple Vitamins-Minerals (ADULT ONE DAILY GUMMIES) CHEW Chew 2 each by mouth daily.      Marland Kitchen omeprazole (PRILOSEC) 20 MG capsule Take 20 mg by mouth daily.      . promethazine (PHENERGAN) 12.5 MG tablet Take 1 tablet (12.5 mg total) by mouth every 8 (eight) hours as needed for nausea.  45 tablet  2  . venlafaxine XR (EFFEXOR-XR) 75 MG 24 hr capsule Take 75  mg by mouth daily.      Marland Kitchen gabapentin (NEURONTIN) 300 MG capsule Take 1 capsule (300 mg total) by mouth 3 (three) times daily.  90 capsule  3    Assessment: 57 yo lady to start heparin for r/o ACS.  She presented to ED with fast HR, SVT.   Goal of Therapy:  Heparin level 0.3-0.7 units/ml Monitor platelets by anticoagulation protocol: Yes   Plan:  Heparin 3500 unit bolus and drip at 1100 units/hr Check heparin level 6 hours after start. Daily heparin level and CBC while on heparin.  Ashawna Hanback Poteet 03/18/2013,3:57 AM

## 2013-03-18 NOTE — Consult Note (Addendum)
Patient ID: RAIGAN BARIA MRN: [id number], DOB/AGE: [birth year]   Admit date: 03/17/2013 Date of Consult: @TODAY @  Primary Physician: Genelle Gather, MD Primary Cardiologist: New    Problem List: Past Medical History  Diagnosis Date  . Depression   . GERD (gastroesophageal reflux disease)   . Hypertension   . 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 Stroke     ruptured berry aneurysm, right hemiparesis and expressive aphasia  . SLE (systemic lupus erythematosus)   . Venous insufficiency (chronic) (peripheral)   . Osteopenia   . ANEMIA-IRON DEFICIENCY 03/02/2006    H&H 8.5/25.9 5/09. On 5/13 12.8/39.7 with MCV 80.4.     Marland Kitchen CEREBROVASCULAR ACCIDENT, HX OF 03/02/2006    Ruptured berry aneurysm, right hemiparesis & expressive aphasia       Past Surgical History  Procedure Laterality Date  . Total abdominal hysterectomy  5/09    Benign cervix, uterine fibroids on bx  . Cerebral aneurysm repair      1990's  . Esophagogastroduodenoscopy  2001    Varices and esophagitis     Allergies:  Allergies  Allergen Reactions  . Lasix [Furosemide] Swelling    mild L facial swelling  . Penicillins Swelling  . Sulfur Itching    HPI: Patient is a 57 yo with a history of HTN CVA (aneurysm rupture), seizure d/o, asthma and SLE who we are asked to see are chest pressure and positive troponin.   Patient presented to ER yesterday with heart beating fast.  Rx with adenosine. Noted cheset pressure when heart beating fast.  Had been going on for about  1 hour.  Dizzy  No syncope.  No SOB   hosp for observation.  Metoprolol increased to 75 mg bid.  Hydralazine increased for HTN Troponin peak 0.71 then 0.48   Patient is a difficult historian  Does note chest pressure when she is doing things  None at rest  No PND.  Activity limited   Does note occasional wheezing.  Has reflux symptoms.   2D echo in 01/2012 LVEF normal  Repeat ordered.    Inpatient Medications:  . aspirin EC  81  mg Oral Daily  . atorvastatin  80 mg Oral q1800  . calcium-vitamin D  0.5 tablet Oral Q breakfast  . gabapentin  300 mg Oral TID  . hydrochlorothiazide  25 mg Oral Daily  . hydroxychloroquine  200 mg Oral BID  . levETIRAcetam  500 mg Oral Q12H  . loratadine  10 mg Oral Daily  . metoprolol tartrate  75 mg Oral BID  . pantoprazole  40 mg Oral Daily  . potassium chloride  40 mEq Oral Q4H  . sodium chloride  3 mL Intravenous Q12H  . venlafaxine XR  75 mg Oral Daily    Family History  Problem Relation Age of Onset  . Heart attack Brother 40     History   Social History  . Marital Status: Widowed    Spouse Name: N/A    Number of Children: N/A  . Years of Education: N/A   Occupational History  . Not on file.   Social History Main Topics  . Smoking status: Former Smoker -- 1.50 packs/day    Types: Cigarettes    Quit date: 05/30/2003  . Smokeless tobacco: Never Used     Comment: she smoked for 30 years before she quit  . Alcohol Use: No  . Drug Use: No  . Sexual Activity: Not on file  Other Topics Concern  . Not on file   Social History Narrative   Has a home nurse,    Lives with her 2 sons,    Former smoker   Widow           Review of Systems: All other systems reviewed and are otherwise negative except as noted above.  Physical Exam: Filed Vitals:   03/18/13 0544  BP: 116/60  Pulse: 97  Temp: 98.1 F (36.7 C)  Resp: 20    Intake/Output Summary (Last 24 hours) at 03/18/13 1232 Last data filed at 03/18/13 0900  Gross per 24 hour  Intake 580.42 ml  Output    400 ml  Net 180.42 ml    General: Well developed, well nourished, in no acute distress. Head: Normocephalic, atraumatic, sclera non-icteric Neck: Negative for carotid bruits. JVP not elevated. Lungs: Clear bilaterally to auscultation Faint upper airway wheeze  Moving air.   Heart: RRR with S1 S2. No murmurs, rubs, or gallops appreciated. Abdomen: Soft, non-tender, non-distended with  normoactive bowel sounds. No hepatomegaly. No rebound/guarding. No obvious abdominal masses. Msk:  Strength and tone appears normal for age. Extremities: No clubbing, cyanosis or edema.  Distal pedal pulses are 2+ and equal bilaterally. Neuro: Alert, oriented.  Understands.  Speaking is difficut  RUE contracted.  RLE weak.  Otherwise exam deferred Psych:  Responds to questions appropriately with a normal affect.  Labs: Results for orders placed during the hospital encounter of 03/17/13 (from the past 24 hour(s))  CBC WITH DIFFERENTIAL     Status: Abnormal   Collection Time    03/17/13 10:31 PM      Result Value Range   WBC 8.7  4.0 - 10.5 K/uL   RBC 4.94  3.87 - 5.11 MIL/uL   Hemoglobin 12.1  12.0 - 15.0 g/dL   HCT 16.1  09.6 - 04.5 %   MCV 78.1  78.0 - 100.0 fL   MCH 24.5 (*) 26.0 - 34.0 pg   MCHC 31.3  30.0 - 36.0 g/dL   RDW 40.9 (*) 81.1 - 91.4 %   Platelets 580 (*) 150 - 400 K/uL   Neutrophils Relative % 70  43 - 77 %   Neutro Abs 6.1  1.7 - 7.7 K/uL   Lymphocytes Relative 23  12 - 46 %   Lymphs Abs 2.0  0.7 - 4.0 K/uL   Monocytes Relative 6  3 - 12 %   Monocytes Absolute 0.5  0.1 - 1.0 K/uL   Eosinophils Relative 1  0 - 5 %   Eosinophils Absolute 0.1  0.0 - 0.7 K/uL   Basophils Relative 0  0 - 1 %   Basophils Absolute 0.0  0.0 - 0.1 K/uL  COMPREHENSIVE METABOLIC PANEL     Status: Abnormal   Collection Time    03/17/13 10:31 PM      Result Value Range   Sodium 138  135 - 145 mEq/L   Potassium 3.5  3.5 - 5.1 mEq/L   Chloride 96  96 - 112 mEq/L   CO2 29  19 - 32 mEq/L   Glucose, Bld 165 (*) 70 - 99 mg/dL   BUN 17  6 - 23 mg/dL   Creatinine, Ser 7.82  0.50 - 1.10 mg/dL   Calcium 95.6  8.4 - 21.3 mg/dL   Total Protein 8.7 (*) 6.0 - 8.3 g/dL   Albumin 3.8  3.5 - 5.2 g/dL   AST 20  0 - 37 U/L  ALT 15  0 - 35 U/L   Alkaline Phosphatase 85  39 - 117 U/L   Total Bilirubin 0.2 (*) 0.3 - 1.2 mg/dL   GFR calc non Af Amer 80 (*) >90 mL/min   GFR calc Af Amer >90  >90 mL/min   POCT I-STAT TROPONIN I     Status: Abnormal   Collection Time    03/17/13 11:32 PM      Result Value Range   Troponin i, poc 0.11 (*) 0.00 - 0.08 ng/mL   Comment NOTIFIED PHYSICIAN     Comment 3           POCT I-STAT TROPONIN I     Status: Abnormal   Collection Time    03/18/13 12:30 AM      Result Value Range   Troponin i, poc 0.32 (*) 0.00 - 0.08 ng/mL   Comment NOTIFIED PHYSICIAN     Comment 3       POC Troponin I result shown to Dr. Preston Fleeting    MAGNESIUM     Status: None   Collection Time    03/18/13  2:30 AM      Result Value Range   Magnesium 1.8  1.5 - 2.5 mg/dL  TROPONIN I     Status: Abnormal   Collection Time    03/18/13  2:30 AM      Result Value Range   Troponin I 0.71 (*) <0.30 ng/mL  URINALYSIS, ROUTINE W REFLEX MICROSCOPIC     Status: Abnormal   Collection Time    03/18/13  3:39 AM      Result Value Range   Color, Urine YELLOW  YELLOW   APPearance CLOUDY (*) CLEAR   Specific Gravity, Urine 1.013  1.005 - 1.030   pH 6.0  5.0 - 8.0   Glucose, UA NEGATIVE  NEGATIVE mg/dL   Hgb urine dipstick NEGATIVE  NEGATIVE   Bilirubin Urine NEGATIVE  NEGATIVE   Ketones, ur NEGATIVE  NEGATIVE mg/dL   Protein, ur NEGATIVE  NEGATIVE mg/dL   Urobilinogen, UA 1.0  0.0 - 1.0 mg/dL   Nitrite POSITIVE (*) NEGATIVE   Leukocytes, UA MODERATE (*) NEGATIVE  URINE MICROSCOPIC-ADD ON     Status: Abnormal   Collection Time    03/18/13  3:39 AM      Result Value Range   Squamous Epithelial / LPF RARE  RARE   WBC, UA 21-50  <3 WBC/hpf   Bacteria, UA MANY (*) RARE  BASIC METABOLIC PANEL     Status: Abnormal   Collection Time    03/18/13  5:00 AM      Result Value Range   Sodium 138  135 - 145 mEq/L   Potassium 3.1 (*) 3.5 - 5.1 mEq/L   Chloride 101  96 - 112 mEq/L   CO2 27  19 - 32 mEq/L   Glucose, Bld 114 (*) 70 - 99 mg/dL   BUN 18  6 - 23 mg/dL   Creatinine, Ser 1.91  0.50 - 1.10 mg/dL   Calcium 9.2  8.4 - 47.8 mg/dL   GFR calc non Af Amer >90  >90 mL/min   GFR calc Af Amer  >90  >90 mL/min  CBC     Status: Abnormal   Collection Time    03/18/13  5:00 AM      Result Value Range   WBC 8.7  4.0 - 10.5 K/uL   RBC 4.28  3.87 - 5.11 MIL/uL   Hemoglobin 10.4 (*)  12.0 - 15.0 g/dL   HCT 40.9 (*) 81.1 - 91.4 %   MCV 77.3 (*) 78.0 - 100.0 fL   MCH 24.3 (*) 26.0 - 34.0 pg   MCHC 31.4  30.0 - 36.0 g/dL   RDW 78.2 (*) 95.6 - 21.3 %   Platelets 471 (*) 150 - 400 K/uL  RETICULOCYTES     Status: None   Collection Time    03/18/13  5:00 AM      Result Value Range   Retic Ct Pct 1.4  0.4 - 3.1 %   RBC. 4.28  3.87 - 5.11 MIL/uL   Retic Count, Manual 59.9  19.0 - 186.0 K/uL  TROPONIN I     Status: Abnormal   Collection Time    03/18/13  8:30 AM      Result Value Range   Troponin I 0.48 (*) <0.30 ng/mL    Radiology/Studies: Dg Chest 2 View  03/17/2013   CLINICAL DATA:  Chest and leg pain, history hypertension, seizure, stroke  EXAM: CHEST  2 VIEW  COMPARISON:  06/02/2012  FINDINGS: External pacing leads.  Chronic elevation of left diaphragm.  Enlargement of cardiac silhouette.  Mediastinal contours are probably normal for the mild degree of rotation.  Chronic left basilar and minimal right basilar atelectasis.  Upper lungs clear.  No pleural effusion or pneumothorax.  Lateral views are limited in diagnostic utility due to underpenetration.  IMPRESSION: Chronic elevation of left diaphragm.  Enlargement of cardiac silhouette.  Bibasilar atelectasis greater on left.   Electronically Signed   By: Ulyses Southward M.D.   On: 03/17/2013 23:26    EKG:  10/31:  SVT  206 bpm.  Sl ST depression III, AVF. 11/1:  SR  82 bpm.      ASSESSMENT AND PLAN:  Patient is a 57 yo admitted with SVT  Converted with help of adenosine. 1.  Troponin elevation Troponin bump is minimal  May reflect subendocaridal strain.  Will check echo  If LVEF is normal I would recomm a lexiscan myoveiew to r/o inducible ischemia since she notes chest tightness with activity I would d/c heparin since she is  asymptomatic  2.  SVT  Very infreq occurrences  No syncope  Agree with increasing b blocker  3.  HTN  Agree with change in meds.   I would also back down on HCTZ given low K  Follow     4. F/E/N  Replete K  5.  Lupus.  No records  6  Hx aneurysmal bleed.    5.-HCM  Check lipids    Signed, Dietrich Pates 03/18/2013, 12:32 PM

## 2013-03-19 ENCOUNTER — Inpatient Hospital Stay (HOSPITAL_COMMUNITY): Payer: Medicare Other

## 2013-03-19 ENCOUNTER — Encounter (HOSPITAL_COMMUNITY): Payer: Self-pay | Admitting: Physician Assistant

## 2013-03-19 DIAGNOSIS — I498 Other specified cardiac arrhythmias: Principal | ICD-10-CM

## 2013-03-19 DIAGNOSIS — R0789 Other chest pain: Secondary | ICD-10-CM | POA: Diagnosis present

## 2013-03-19 DIAGNOSIS — R079 Chest pain, unspecified: Secondary | ICD-10-CM

## 2013-03-19 LAB — CBC
HCT: 36.8 % (ref 36.0–46.0)
MCH: 24 pg — ABNORMAL LOW (ref 26.0–34.0)
MCHC: 30.7 g/dL (ref 30.0–36.0)
RDW: 16.3 % — ABNORMAL HIGH (ref 11.5–15.5)

## 2013-03-19 LAB — BASIC METABOLIC PANEL
BUN: 12 mg/dL (ref 6–23)
CO2: 29 mEq/L (ref 19–32)
Calcium: 9.6 mg/dL (ref 8.4–10.5)
Chloride: 103 mEq/L (ref 96–112)
Creatinine, Ser: 0.68 mg/dL (ref 0.50–1.10)
GFR calc Af Amer: >90 mL/min (ref >90)
GFR calc non Af Amer: >90 mL/min (ref >90)
Glucose, Bld: 99 mg/dL (ref 70–99)
Potassium: 4.3 mEq/L (ref 3.5–5.1)
Sodium: 140 mEq/L (ref 135–145)

## 2013-03-19 MED ORDER — METOPROLOL TARTRATE 50 MG PO TABS: 75.0000 mg | ORAL_TABLET | Freq: Two times a day (BID) | ORAL | Status: DC

## 2013-03-19 MED ORDER — HYDROCHLOROTHIAZIDE 12.5 MG PO TABS: 12.5000 mg | ORAL_TABLET | Freq: Every day | ORAL | Status: DC

## 2013-03-19 MED ORDER — TECHNETIUM TC 99M SESTAMIBI - CARDIOLITE
30.0000 | Freq: Once | INTRAVENOUS | Status: AC | PRN
  Administered 2013-03-19: 30 via INTRAVENOUS

## 2013-03-19 MED ORDER — REGADENOSON 0.4 MG/5ML IV SOLN
INTRAVENOUS | Status: AC
  Administered 2013-03-19: 10:00:00
  Filled 2013-03-19: qty 5

## 2013-03-19 MED ORDER — TECHNETIUM TC 99M SESTAMIBI GENERIC - CARDIOLITE
10.0000 | Freq: Once | INTRAVENOUS | Status: AC | PRN
  Administered 2013-03-19: 10 via INTRAVENOUS

## 2013-03-19 MED ORDER — HYDROCHLOROTHIAZIDE 12.5 MG PO CAPS
12.5000 mg | ORAL_CAPSULE | Freq: Every day | ORAL | Status: DC
Start: 2013-03-19 — End: 2013-03-19
  Administered 2013-03-19: 12.5 mg via ORAL
  Filled 2013-03-19: qty 1

## 2013-03-19 MED ORDER — IBUPROFEN 600 MG PO TABS
600.0000 mg | ORAL_TABLET | Freq: Four times a day (QID) | ORAL | Status: DC | PRN
  Administered 2013-03-19: 600 mg via ORAL
  Filled 2013-03-19: qty 1

## 2013-03-19 MED ORDER — ATORVASTATIN CALCIUM 40 MG PO TABS: 40.0000 mg | ORAL_TABLET | Freq: Every day | ORAL | Status: DC

## 2013-03-19 NOTE — Discharge Summary (Signed)
Name: Wanda [name] MRN: [id number] DOB: [birth year] 57 y.o. PCP: Wanda Gather, MD  Date of Admission: 03/17/2013  9:39 PM Date of Discharge: 03/19/13 Attending Physician: Kem Kays  Discharge Diagnosis: 1. SVT- now in sinus rhythm on Lopressor 75 mg BID;  echo with EF 60-65%, normal systolic function, mild mitral regurg; negative Lexiscan  2. Elevated troponin- peaked at 0.71, likely demand 3. SLE 4. HTN- stable, HCTZ decreased to 12.5 mg daily 5. Seizure disorder 7. History of CVA (residual right hemiparesis, expressive aphasia)  Discharge Medications:   Medication List    STOP taking these medications       hydrALAZINE 25 MG tablet  Commonly known as:  APRESOLINE      TAKE these medications       ADULT ONE DAILY GUMMIES Chew  Chew 2 each by mouth daily.     albuterol (5 MG/ML) 0.5% nebulizer solution  Commonly known as:  PROVENTIL  Take 0.5 mLs (2.5 mg total) by nebulization every 4 (four) hours as needed. For shortness of breath     aspirin 81 MG tablet  Take 81 mg by mouth daily.     atorvastatin 40 MG tablet  Commonly known as:  LIPITOR  Take 1 tablet (40 mg total) by mouth daily.     calcium-vitamin D 250-125 MG-UNIT per tablet  Commonly known as:  OSCAL  Take 1 tablet by mouth daily.     furosemide 20 MG tablet  Commonly known as:  LASIX  Take 20 mg by mouth daily.     hydrochlorothiazide 12.5 MG tablet  Commonly known as:  HYDRODIURIL  Take 1 tablet (12.5 mg total) by mouth daily.     hydroxychloroquine 200 MG tablet  Commonly known as:  PLAQUENIL  Take 1 tablet (200 mg total) by mouth 2 (two) times daily.     KLOR-CON 10 10 MEQ tablet  Generic drug:  potassium chloride  Take 10 mEq by mouth daily.     levETIRAcetam 500 MG tablet  Commonly known as:  KEPPRA  Take 500 mg by mouth every 12 (twelve) hours.     loratadine 10 MG tablet  Commonly known as:  CLARITIN  Take 1 tablet (10 mg total) by mouth daily.     metoprolol 50 MG tablet    Commonly known as:  LOPRESSOR  Take 1.5 tablets (75 mg total) by mouth 2 (two) times daily.     omeprazole 20 MG capsule  Commonly known as:  PRILOSEC  Take 20 mg by mouth daily.     promethazine 12.5 MG tablet  Commonly known as:  PHENERGAN  Take 1 tablet (12.5 mg total) by mouth every 8 (eight) hours as needed for nausea.     REPLESTA 50000 UNITS Wafr  Generic drug:  Cholecalciferol  Take 50,000 Units by mouth every 7 (seven) days. On wednesdays     venlafaxine XR 75 MG 24 hr capsule  Commonly known as:  EFFEXOR-XR  Take 75 mg by mouth daily.        Disposition and follow-up:   [name] was discharged from [facility] in Stable condition.  At the hospital follow up visit please address:  1.  Heart rate, medication compliance  2.  Labs / imaging needed at time of follow-up: BMP (check K)  3.  Pending labs/ test needing follow-up: none  Follow-up Appointments:  Discharge Instructions: Discharge Orders   Future Appointments Provider Department Dept Phone   04/26/2013 1:45 PM  Wanda Gather, MD [facility] [phone]   Future Orders Complete By Expires   Diet - low sodium heart healthy  As directed    Discharge instructions  As directed    Comments:     1) Read all the information given 2) Note medication changes  3) Pick up new medication from your pharmacy Lipitor for cholesterol  4) Follow up with the heart doctors and Internal Medicine clinic   Increase activity slowly  As directed       Consultations: Treatment Team:  Rounding Lbcardiology, MD  Procedures Performed:  Dg Chest 2 View  03/17/2013   CLINICAL DATA:  Chest and leg pain, history hypertension, seizure, stroke  EXAM: CHEST  2 VIEW  COMPARISON:  06/02/2012  FINDINGS: External pacing leads.  Chronic elevation of left diaphragm.  Enlargement of cardiac silhouette.  Mediastinal contours are probably normal for the mild degree of rotation.  Chronic  left basilar and minimal right basilar atelectasis.  Upper lungs clear.  No pleural effusion or pneumothorax.  Lateral views are limited in diagnostic utility due to underpenetration.  IMPRESSION: Chronic elevation of left diaphragm.  Enlargement of cardiac silhouette.  Bibasilar atelectasis greater on left.   Electronically Signed   By: Ulyses Southward M.D.   On: 03/17/2013 23:26   Nm Myocar Multi W/spect W/wall Motion / Ef  03/19/2013   CLINICAL DATA:  57 year old with chest pain.  EXAM: MYOCARDIAL IMAGING WITH SPECT (REST AND PHARMACOLOGIC-STRESS)  GATED LEFT VENTRICULAR WALL MOTION STUDY  LEFT VENTRICULAR EJECTION FRACTION  TECHNIQUE: Standard myocardial SPECT imaging performed after resting intravenous injection of 10 mCi Tc-53m sestimibi. Subsequently, intravenous infusion of regadenoson performed under the supervision of the Cardiology staff. At peak effect of the drug, 30 mCi Tc-96m sestimibi injected intravenously and standard myocardial SPECT imaging performed. Quantitative gated imaging also performed to evaluate left ventricular wall motion and estimate left ventricular ejection fraction.  FINDINGS: MYOCARDIAL IMAGING WITH SPECT (REST AND PHARMACOLOGIC-STRESS)  There are fixed defects along the anteroseptal and inferolateral walls. There is no significant reversible defect.  GATED LEFT VENTRICULAR WALL MOTION STUDY  Review of the gated images demonstrates normal wall motion except for mild inferior wall hypokinesia.  LEFT VENTRICULAR EJECTION FRACTION  QGS ejection fraction measures 60% , with an end-diastolic volume of 55 ml and an end-systolic volume of 22 ml.  IMPRESSION: No evidence for pharmacological induced ischemia.  Fixed defects along the anteroseptal and inferolateral walls. These fixed defects may be related to attenuation artifact because there is not significant wall abnormality in these areas.   Electronically Signed   By: Richarda Overlie M.D.   On: 03/19/2013 13:19    2D Echo:   Left  ventricle: The cavity size was normal. Wall thickness was normal. Systolic function was normal. The estimated ejection fraction was in the range of 60% to 65%. Doppler parameters are consistent with abnormal left ventricular relaxation (grade 1 diastolic dysfunction). - Aortic valve: Mild regurgitation.   Admission HPI:  The patient is a 57 YO female with PMH significant for stroke with aneurysm rupture and resultant expressive aphasia and R hemiparesis, HTN, tachycardia, lupus, seizure disorder, asthma who is coming into the ED with heart beating fast. She states that it started about 1 hour prior to arrival at ED. She was recognized to be in SVT and given adenosine with conversion to sinus in ED. She then felt much better and now is denying any complaints. She states that her feet are  hurting and she would like something to drink. She denies chest pain earlier in the day or previous days. She denies nausea or vomiting or diarrhea. She denies SOB or new swelling. History was difficult to obtain given the expressive aphasia.   Hospital Course by problem list: 1. SVT- Patient had palpitations x 1 hour, and HR to 206 on presentation in ED; first such episode.  She was successfully converted to sinus rhythm with adenosine 6 mg IV in ED.  Her metoprolol dose was also increased from 50 mg BID to 75 mg BID, and she was continued on this dose at discharge.  TSH within normal limits. Echo results above.  Lexiscan on 11/2 showed no evidence for pharmacological induced ischemia; fixed defects along anteroseptal and inferolateral walls which may be related to attenuation artifact because there is not significant wall abnormality in these areas.  2. Elevated troponin- troponin peaked at 0.71, trended down to 0.48 then <0.30.  Positive likely due to demand ischemia post-SVT.  Echo and Lexiscan results above.    3. SLE- home medication (hydroxychloroquine) continued, pt follows with rheumatologist Dr. Phylliss Bob.     4. HTN-  Discontinued hydralazine and decreased HCTZ to 12.5 mg daily per cardiology recs.  (Also increased metoprolol from 50 to 75 mg BID per above.)  Reassess need for hydralazine at outpatient follow-up.  Discharge Vitals:   BP 112/75  Pulse 88  Temp(Src) 97.8 F (36.6 C) (Oral)  Resp 18  Ht 5\' 10"  (1.778 m)  Wt 194 lb 3.6 oz (88.1 kg)  BMI 27.87 kg/m2  SpO2 100%  Discharge Labs:  No results found for this or any previous visit (from the past 24 hour(s)).  Signed: Rocco Serene, MD 03/23/2013, 7:35 AM   Time Spent on Discharge: 40 minutes Services Ordered on Discharge: none Equipment Ordered on Discharge: none

## 2013-03-19 NOTE — Progress Notes (Addendum)
Subjective: Patient is doing well this morning.  She had an echo yesterday evening.  She has been NPO since midnight in preparation for her Eugenie Birks which was done this morning.  Denies chest pain, SOB, vomiting.  She does report some foot pain bilaterally, unclear if this is a chronic issue for her.   Objective: Vital signs in last 24 hours: Filed Vitals:   03/18/13 0544 03/18/13 1315 03/18/13 2020 03/19/13 0443  BP: 116/60 108/52 120/70 121/63  Pulse: 97 70 70 72  Temp: 98.1 F (36.7 C) 97.4 F (36.3 C) 97.9 F (36.6 C) 97.7 F (36.5 C)  TempSrc: Oral Oral Oral Oral  Resp: 20 20 21 19   Weight:    194 lb 3.6 oz (88.1 kg)  SpO2: 98% 100% 100% 100%   Weight change: -3 lb 8.4 oz (-1.6 kg)  Intake/Output Summary (Last 24 hours) at 03/19/13 0713 Last data filed at 03/18/13 1800  Gross per 24 hour  Intake 580.42 ml  Output    300 ml  Net 280.42 ml   Vitals reviewed. General: resting in bed, NAD HEENT: no scleral icterus Cardiac: RRR, no rubs, murmurs or gallops Pulm: clear to auscultation bilaterally, no wheezes, rales, or rhonchi Abd: soft, nontender, nondistended, BS present Ext: warm and well perfused, trace pedal edema Neuro: alert and oriented X3, expressive aphagia, right hemiparesis s/p stroke, otherwise cranial nerves II-XII grossly intact  Lab Results: Basic Metabolic Panel:  Recent Labs Lab 03/18/13 0230 03/18/13 0500 03/19/13 0526  NA  --  138 140  K  --  3.1* 4.3  CL  --  101 103  CO2  --  27 29  GLUCOSE  --  114* 99  BUN  --  18 12  CREATININE  --  0.71 0.68  CALCIUM  --  9.2 9.6  MG 1.8  --  2.1   Liver Function Tests:  Recent Labs Lab 03/17/13 2231  AST 20  ALT 15  ALKPHOS 85  BILITOT 0.2*  PROT 8.7*  ALBUMIN 3.8   CBC:  Recent Labs Lab 03/17/13 2231 03/18/13 0500 03/19/13 0526  WBC 8.7 8.7 5.9  NEUTROABS 6.1  --   --   HGB 12.1 10.4* 11.3*  HCT 38.6 33.1* 36.8  MCV 78.1 77.3* 78.3  PLT 580* 471* PLATELET CLUMPS NOTED ON  SMEAR, COUNT APPEARS ADEQUATE   Cardiac Enzymes:  Recent Labs Lab 03/18/13 0230 03/18/13 0830 03/18/13 1515  TROPONINI 0.71* 0.48* <0.30   Hemoglobin A1C:  Recent Labs Lab 03/18/13 0230  HGBA1C 6.4*   Fasting Lipid Panel:  Recent Labs Lab 03/18/13 0500  CHOL 183  HDL 48  LDLCALC 123*  TRIG 60  CHOLHDL 3.8   Thyroid Function Tests:  Recent Labs Lab 03/18/13 0230  TSH 2.964   Anemia Panel:  Recent Labs Lab 03/18/13 0500  VITAMINB12 517  FOLATE >20.0  FERRITIN 14  TIBC 357  IRON 18*  RETICCTPCT 1.4   Urinalysis:  Recent Labs Lab 03/18/13 0339  COLORURINE YELLOW  LABSPEC 1.013  PHURINE 6.0  GLUCOSEU NEGATIVE  HGBUR NEGATIVE  BILIRUBINUR NEGATIVE  KETONESUR NEGATIVE  PROTEINUR NEGATIVE  UROBILINOGEN 1.0  NITRITE POSITIVE*  LEUKOCYTESUR MODERATE*    Studies/Results: Dg Chest 2 View  03/17/2013   CLINICAL DATA:  Chest and leg pain, history hypertension, seizure, stroke  EXAM: CHEST  2 VIEW  COMPARISON:  06/02/2012  FINDINGS: External pacing leads.  Chronic elevation of left diaphragm.  Enlargement of cardiac silhouette.  Mediastinal contours are probably  normal for the mild degree of rotation.  Chronic left basilar and minimal right basilar atelectasis.  Upper lungs clear.  No pleural effusion or pneumothorax.  Lateral views are limited in diagnostic utility due to underpenetration.  IMPRESSION: Chronic elevation of left diaphragm.  Enlargement of cardiac silhouette.  Bibasilar atelectasis greater on left.   Electronically Signed   By: Ulyses Southward M.D.   On: 03/17/2013 23:26   Medications:  Scheduled Meds: . aspirin EC  81 mg Oral Daily  . atorvastatin  80 mg Oral q1800  . calcium-vitamin D  0.5 tablet Oral Q breakfast  . enoxaparin (LOVENOX) injection  40 mg Subcutaneous Q24H  . gabapentin  300 mg Oral TID  . hydrochlorothiazide  12.5 mg Oral Daily  . hydroxychloroquine  200 mg Oral BID  . levETIRAcetam  500 mg Oral Q12H  . loratadine  10 mg  Oral Daily  . metoprolol tartrate  75 mg Oral BID  . pantoprazole  40 mg Oral Daily  . sodium chloride  3 mL Intravenous Q12H  . venlafaxine XR  75 mg Oral Daily   PRN Meds:.albuterol, promethazine  Assessment/Plan: 57 y.o PMH SLE, stroke with new onset SVT now in NSR.   #SVT (supraventricular tachycardia)- now in NSR (HR 72).   -cardiology following, appreciate recs -continue telemetry  -continue Lopressor 75 mg BID, Lipitor 80 mg qhs  #Troponin elevation, resolved- likely 2/2 demand ischemia post-SVT (peaked at 0.71).   Echo with EF 60-65%, normal systolic function, mild mitral regurgitation.   -Lexiscan this AM, should have read by this afternoon  #SLE -Continue home meds (hydroxychloroquine) -Follow with Dr. Phylliss Bob outpt (Rheum)  #HTN, stable- HCTZ decreased to 12.5 mg due to hypoK per cards, increased Lopressor to 75 mg BID -continue to monitor  #Seizure disorder -Continue home meds (Keppra), precautions  #F/E/N- restart soft mechanical diet after Lexiscan  #DVT PPX- Lovenox subq   Dispo: Disposition is deferred at this time, awaiting improvement of current medical problems.  Anticipated discharge in approximately 1 day(s).   The patient does have a current PCP Genelle Gather, MD) and does need an [facility] hospital follow-up appointment after discharge.  .Services Needed at time of discharge: Y = Yes, Blank = No PT:   OT:   RN:   Equipment:   Other:     LOS: 2 days   Rocco Serene, MD  03/19/2013, 7:13 AM

## 2013-03-19 NOTE — Progress Notes (Signed)
Patient: Wanda [name] / Admit Date: 03/17/2013 / Date of Encounter: 03/19/2013, 8:45 AM   Subjective  Feels well. No CP or SOB. Tolerated Cardiolite well without arrhythmia observed while down in nuc.  Objective   Telemetry: pt seen in nuc. md to review tele on floor.  Physical Exam: Blood pressure 121/63, pulse 72, temperature 97.7 F (36.5 C), temperature source Oral, resp. rate 19, weight 194 lb 3.6 oz (88.1 kg), SpO2 100.00%. General: Well developed, well nourished AAF, in no acute distress. Head: Normocephalic, atraumatic, sclera non-icteric, no xanthomas, nares are without discharge. Neck:  JVP not elevated. Lungs: Clear bilaterally to auscultation without wheezes, rales, or rhonchi. Breathing is unlabored. Heart: RRR S1 S2 without murmurs, rubs, or gallops.  Abdomen: Soft, non-tender, non-distended with normoactive bowel sounds.  Extremities: No clubbing or cyanosis. No edema. Distal pedal pulses are 2+ and equal bilaterally. Neuro: Mostly nonverbal but understands speech well. Able to communicate with [place] through nods during the test.   Intake/Output Summary (Last 24 hours) at 03/19/13 0845 Last data filed at 03/18/13 1800  Gross per 24 hour  Intake 530.42 ml  Output    300 ml  Net 230.42 ml    Inpatient Medications:  . aspirin EC  81 mg Oral Daily  . atorvastatin  80 mg Oral q1800  . calcium-vitamin D  0.5 tablet Oral Q breakfast  . enoxaparin (LOVENOX) injection  40 mg Subcutaneous Q24H  . gabapentin  300 mg Oral TID  . hydrochlorothiazide  12.5 mg Oral Daily  . hydroxychloroquine  200 mg Oral BID  . levETIRAcetam  500 mg Oral Q12H  . loratadine  10 mg Oral Daily  . metoprolol tartrate  75 mg Oral BID  . pantoprazole  40 mg Oral Daily  . regadenoson      . sodium chloride  3 mL Intravenous Q12H  . venlafaxine XR  75 mg Oral Daily   Infusions:    Labs:  Recent Labs  03/18/13 0230 03/18/13 0500 03/19/13 0526  NA  --  138 140  K  --  3.1* 4.3  CL   --  101 103  CO2  --  27 29  GLUCOSE  --  114* 99  BUN  --  18 12  CREATININE  --  0.71 0.68  CALCIUM  --  9.2 9.6  MG 1.8  --  2.1    Recent Labs  03/17/13 2231  AST 20  ALT 15  ALKPHOS 85  BILITOT 0.2*  PROT 8.7*  ALBUMIN 3.8    Recent Labs  03/17/13 2231 03/18/13 0500 03/19/13 0526  WBC 8.7 8.7 5.9  NEUTROABS 6.1  --   --   HGB 12.1 10.4* 11.3*  HCT 38.6 33.1* 36.8  MCV 78.1 77.3* 78.3  PLT 580* 471* PLATELET CLUMPS NOTED ON SMEAR, COUNT APPEARS ADEQUATE    Recent Labs  03/18/13 0230 03/18/13 0830 03/18/13 1515  TROPONINI 0.71* 0.48* <0.30   No components found with this basename: POCBNP,   Recent Labs  03/18/13 0230  HGBA1C 6.4*     Radiology/Studies:  Dg Chest 2 View  03/17/2013   CLINICAL DATA:  Chest and leg pain, history hypertension, seizure, stroke  EXAM: CHEST  2 VIEW  COMPARISON:  06/02/2012  FINDINGS: External pacing leads.  Chronic elevation of left diaphragm.  Enlargement of cardiac silhouette.  Mediastinal contours are probably normal for the mild degree of rotation.  Chronic left basilar and minimal right basilar atelectasis.  Upper lungs clear.  No pleural effusion or pneumothorax.  Lateral views are limited in diagnostic utility due to underpenetration.  IMPRESSION: Chronic elevation of left diaphragm.  Enlargement of cardiac silhouette.  Bibasilar atelectasis greater on left.   Electronically Signed   By: Ulyses Southward M.D.   On: 03/17/2013 23:26     Assessment and Plan  1. SVT 2. Minimal troponin elevation likely due to subendocardial strain due to SVT 3. HTN 4. Hypokalemia 5. SLE 6. Hx of aneurysmal bleed  For Cardiolite today, results pending (usually become available in afternoon). 2D echo was OK. Agree with ASA, increase in BB and med changes as outlined in previous note. Potassium repletion per IM. Further recs pending based on nuc.  Signed, Ronie Spies PA-C Patient seen and examined. I agree with the assessment and plan as  detailed above. See also my additional thoughts below.   The patient's nuclear stress test reveals normal wall motion. There is no definite ischemia. There is question of some small areas of attenuation. Overall there is no significant abnormality. With all of the data obtained the patient is stable. We will plan to discharge her home.  Willa Rough, MD, [facility] 03/19/2013 2:10 PM

## 2013-03-20 DIAGNOSIS — Z23 Encounter for immunization: Secondary | ICD-10-CM | POA: Diagnosis not present

## 2013-03-20 DIAGNOSIS — Z862 Personal history of diseases of the blood and blood-forming organs and certain disorders involving the immune mechanism: Secondary | ICD-10-CM | POA: Diagnosis not present

## 2013-03-20 LAB — URINE CULTURE: Colony Count: >=100000

## 2013-03-21 ENCOUNTER — Encounter: Payer: Self-pay | Admitting: Internal Medicine

## 2013-03-21 ENCOUNTER — Other Ambulatory Visit: Payer: Self-pay | Admitting: Internal Medicine

## 2013-03-21 DIAGNOSIS — N390 Urinary tract infection, site not specified: Secondary | ICD-10-CM | POA: Insufficient documentation

## 2013-03-21 MED ORDER — LEVOFLOXACIN 250 MG PO TABS: 250.0000 mg | ORAL_TABLET | Freq: Every day | ORAL | Status: DC

## 2013-03-21 NOTE — Assessment & Plan Note (Signed)
Treating with Levaquin 250mg  po x7 days

## 2013-03-21 NOTE — Progress Notes (Signed)
03/18/13 urine culture taken in the hospital is positive for E. Coli, >=100,000 colonies. Will treat with Levaquin 250mg  po daily x7 days. I called and spoke to Wanda [name] son Wanda [name] who said that they would pick up the antibiotic.

## 2013-03-27 ENCOUNTER — Other Ambulatory Visit: Payer: Self-pay | Admitting: Internal Medicine

## 2013-03-27 ENCOUNTER — Encounter: Payer: Self-pay | Admitting: Internal Medicine

## 2013-03-27 DIAGNOSIS — M810 Age-related osteoporosis without current pathological fracture: Secondary | ICD-10-CM

## 2013-03-27 MED ORDER — DENOSUMAB 60 MG/ML ~~LOC~~ SOLN: 60.0000 mg | SUBCUTANEOUS | Status: AC

## 2013-04-03 ENCOUNTER — Other Ambulatory Visit: Payer: Self-pay | Admitting: Internal Medicine

## 2013-04-03 DIAGNOSIS — G40909 Epilepsy, unspecified, not intractable, without status epilepticus: Secondary | ICD-10-CM

## 2013-04-05 DIAGNOSIS — E559 Vitamin D deficiency, unspecified: Secondary | ICD-10-CM | POA: Diagnosis not present

## 2013-04-05 DIAGNOSIS — M810 Age-related osteoporosis without current pathological fracture: Secondary | ICD-10-CM | POA: Diagnosis not present

## 2013-04-05 DIAGNOSIS — R5381 Other malaise: Secondary | ICD-10-CM | POA: Diagnosis not present

## 2013-04-17 ENCOUNTER — Other Ambulatory Visit: Payer: Self-pay | Admitting: Internal Medicine

## 2013-04-26 ENCOUNTER — Encounter: Payer: Self-pay | Admitting: Internal Medicine

## 2013-04-26 ENCOUNTER — Ambulatory Visit (INDEPENDENT_AMBULATORY_CARE_PROVIDER_SITE_OTHER): Payer: Medicare Other | Admitting: Internal Medicine

## 2013-04-26 VITALS — BP 139/82 | HR 75 | Temp 96.9°F | Wt 197.3 lb

## 2013-04-26 DIAGNOSIS — G4700 Insomnia, unspecified: Secondary | ICD-10-CM | POA: Diagnosis not present

## 2013-04-26 DIAGNOSIS — I498 Other specified cardiac arrhythmias: Secondary | ICD-10-CM | POA: Diagnosis not present

## 2013-04-26 DIAGNOSIS — R609 Edema, unspecified: Secondary | ICD-10-CM

## 2013-04-26 DIAGNOSIS — Z0000 Encounter for general adult medical examination without abnormal findings: Secondary | ICD-10-CM | POA: Diagnosis not present

## 2013-04-26 DIAGNOSIS — I471 Supraventricular tachycardia: Secondary | ICD-10-CM

## 2013-04-26 DIAGNOSIS — M329 Systemic lupus erythematosus, unspecified: Secondary | ICD-10-CM

## 2013-04-26 DIAGNOSIS — M79673 Pain in unspecified foot: Secondary | ICD-10-CM

## 2013-04-26 DIAGNOSIS — M810 Age-related osteoporosis without current pathological fracture: Secondary | ICD-10-CM | POA: Diagnosis not present

## 2013-04-26 DIAGNOSIS — I10 Essential (primary) hypertension: Secondary | ICD-10-CM

## 2013-04-26 DIAGNOSIS — R600 Localized edema: Secondary | ICD-10-CM

## 2013-04-26 MED ORDER — HYDROCODONE-ACETAMINOPHEN 5-500 MG PO TABS: 1.0000 | ORAL_TABLET | Freq: Every evening | ORAL | Status: DC | PRN

## 2013-04-26 MED ORDER — METOPROLOL TARTRATE 50 MG PO TABS: 75.0000 mg | ORAL_TABLET | Freq: Two times a day (BID) | ORAL | Status: DC

## 2013-04-26 NOTE — Assessment & Plan Note (Signed)
BP Readings from Last 3 Encounters:  04/26/13 139/82  03/19/13 112/75  03/14/13 134/80    Lab Results  Component Value Date   NA 140 03/19/2013   K 4.3 03/19/2013   CREATININE 0.68 03/19/2013    Assessment: Blood pressure control: controlled Progress toward BP goal:  at goal Comments: Her BP is slightly more elevated today but still controlled.  Plan: Medications:  continue current medications Educational resources provided: brochure;handout;video Self management tools provided:   Other plans: f/u in about 4 months or sooner if needed

## 2013-04-26 NOTE — Assessment & Plan Note (Addendum)
Since discharge, the patient's heart rate seems to have been regular. While in the hospital her Lopressor was increased to 75 mg twice a day. The patient had difficulty getting the prescription filled for the correct amount of pills, and has only been taking 50 mg up until the past few days when she began taking 75 mg. Today her heart rate and rhythm are regular. I am giving her a new prescription for the Lopressor 50mg  take one and half tablets twice a day, dispense #90.

## 2013-04-26 NOTE — Assessment & Plan Note (Signed)
Patient's caregiver, they were unable to get the patient's mammogram. She was told that we have to we referred the patient. I have put the order in again for the patient to have a screening mammogram.

## 2013-04-26 NOTE — Progress Notes (Signed)
Patient ID: [name], female   DOB: Jan 31, 1956, 57 y.o.   MRN: [id number]  Subjective:   Patient ID: TAHISHA HAKIM female   DOB: 06/22/1955 57 y.o.   MRN: [id number]  HPI: Ms.Jeaneen A Faxon is a 57 y.o. 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  She was cardiac workup w/ echo with EF 60-65%, normal systolic function, mild mitral regurgitation; negative Lexiscan. Her Lopressor was increased to 75 BID, but unfortunately she did not receive enough to take 75mg  BID and has only been taking 75mg  BID for a few days. Her HCTZ was decreased to 12.5mg  BID which she is taking  She did go see Rheumatologist, who did not feel that the pt had SLE and discontinued her hydroxychloroquine.   She is still having intermittent BLE edema and pain that is improved with compression stockings. She is rarely having to use a Vicodin for her pain.  Per her caregiver, overall Mrs. Rought is doing well.    Past Medical History  Diagnosis Date  . Depression   . GERD (gastroesophageal reflux disease)   . Hypertension   . 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 SLE (systemic lupus erythematosus)   . Venous insufficiency (chronic) (peripheral)   . Osteopenia   . ANEMIA-IRON DEFICIENCY 03/02/2006    H&H 8.5/25.9 5/09. On 5/13 12.8/39.7 with MCV 80.4.     Marland Kitchen CEREBROVASCULAR ACCIDENT, HX OF 03/02/2006    Ruptured berry aneurysm, right hemiparesis & expressive aphasia     . SVT (supraventricular tachycardia)     a. 03/2013 with mildly elevated troponin at that time.   Current Outpatient Prescriptions  Medication Sig Dispense Refill  . albuterol (PROVENTIL) (5 MG/ML) 0.5% nebulizer solution Take 0.5 mLs (2.5 mg total) by nebulization every 4 (four) hours as needed. For shortness of breath  20 mL  3  . aspirin 81 MG tablet  Take 81 mg by mouth daily.        Marland Kitchen atorvastatin (LIPITOR) 40 MG tablet Take 1 tablet (40 mg total) by mouth daily.  30 tablet  2  . calcium-vitamin D (OSCAL) 250-125 MG-UNIT per tablet Take 1 tablet by mouth daily.        . Cholecalciferol (REPLESTA) 50000 UNITS WAFR Take 50,000 Units by mouth every 7 (seven) days. On wednesdays      . denosumab (PROLIA) 60 MG/ML SOLN injection Inject 60 mg into the skin every 6 (six) months. Administer in upper arm, thigh, or abdomen    0  . furosemide (LASIX) 20 MG tablet Take 20 mg by mouth daily.      Marland Kitchen gabapentin (NEURONTIN) 300 MG capsule TAKE 1 CAPSULE (300 MG TOTAL) BY MOUTH 3 (THREE) TIMES DAILY.  270 capsule  3  . hydrochlorothiazide (HYDRODIURIL) 12.5 MG tablet Take 1 tablet (12.5 mg total) by mouth daily.  30 tablet  2  . hydroxychloroquine (PLAQUENIL) 200 MG tablet Take 1 tablet (200 mg total) by mouth 2 (two) times daily.  60 tablet  1  . KLOR-CON 10 10 MEQ tablet Take 10 mEq by mouth daily.       Marland Kitchen levETIRAcetam (KEPPRA) 500 MG tablet TAKE ONE TABLET TWICE DAILY  60 tablet  11  . levofloxacin (LEVAQUIN) 250 MG tablet Take 1 tablet (250 mg total) by mouth daily.  7 tablet  0  . loratadine (CLARITIN) 10 MG tablet Take 1 tablet (10 mg total) by mouth daily.  30 tablet  3  . metoprolol (LOPRESSOR) 50 MG tablet Take 1.5 tablets (75 mg total) by mouth 2 (two) times daily.  45 tablet  2  . Multiple Vitamins-Minerals (ADULT ONE DAILY GUMMIES) CHEW Chew 2 each by mouth daily.      Marland Kitchen omeprazole (PRILOSEC) 20 MG capsule Take 20 mg by mouth daily.      . promethazine (PHENERGAN) 12.5 MG tablet Take 1 tablet (12.5 mg total) by mouth every 8 (eight) hours as needed for nausea.  45 tablet  2  . venlafaxine XR (EFFEXOR-XR) 75 MG 24 hr capsule Take 75 mg by mouth daily.       No current facility-administered medications for this visit.   Family History  Problem Relation Age of Onset  . Heart attack Brother 86   History   Social History  . Marital Status:  Widowed    Spouse Name: N/A    Number of Children: N/A  . Years of Education: N/A   Social History Main Topics  . Smoking status: Former Smoker -- 1.50 packs/day    Types: Cigarettes    Quit date: 05/30/2003  . Smokeless tobacco: Never Used     Comment: she smoked for 30 years before she quit  . Alcohol Use: No  . Drug Use: No  . Sexual Activity: None   Other Topics Concern  . None   Social History Narrative   Has a home nurse,    Lives with her 2 sons,    Former smoker   Widow         Review of Systems: A 12 point ROS was performed; pertinent positives and negatives were noted in the HPI   Objective:  Physical Exam: Filed Vitals:   04/26/13 1354  BP: 139/82  Pulse: 75  Temp: 96.9 F (36.1 C)  TempSrc: Oral  Weight: 197 lb 4.8 oz (89.495 kg)  SpO2: 97%   Constitutional: Vital signs reviewed.  Patient is a well-developed and well-nourished female in no acute distress and cooperative with exam. Head: Normocephalic and atraumatic Eyes: PERRL, EOMI, conjunctivae normal, No scleral icterus.  Neck: Supple, Trachea midline normal ROM Cardiovascular: RRR, no MRG, pulses symmetric and intact bilaterally Pulmonary/Chest: normal respiratory effort, CTAB, no wheezes, rales, or rhonchi Abdominal: Soft. Non-tender, non-distended, bowel sounds are normal, no masses, organomegaly, or guarding present.  Musculoskeletal: R partial hemiparesis Neurological: A&O x3, expressive aphasia Skin: Warm, dry and intact.  Psychiatric: Normal mood and affect.   Assessment & Plan:   Please refer to Problem List based Assessment and Plan

## 2013-04-26 NOTE — Assessment & Plan Note (Signed)
Patient has compression stockings, which her caregiver says she has started to use. When her legs hurt she uses either Tylenol or Motrin. They deny any breakdown of the skin or any wounds. On exam, her stockings are in place, but she has not appreciable edema. I am providing a prescription for Vicodin 5-325 one tablet every 6 hours when necessary pain, dispense #30.

## 2013-04-27 NOTE — Assessment & Plan Note (Addendum)
Per the patient's caregiver, the patient was finally seen by the rheumatologist who states that the patient does not have lupus and stopped the Plaquenil. Have requested that they have the records sent.

## 2013-04-27 NOTE — Progress Notes (Signed)
Case discussed with Dr. Glenn at time of visit.  We reviewed the resident's history and exam and pertinent patient test results.  I agree with the assessment, diagnosis, and plan of care documented in the resident's note. 

## 2013-04-28 ENCOUNTER — Other Ambulatory Visit: Payer: Self-pay | Admitting: Internal Medicine

## 2013-04-28 DIAGNOSIS — R600 Localized edema: Secondary | ICD-10-CM

## 2013-04-28 MED ORDER — HYDROCODONE-ACETAMINOPHEN 5-325 MG PO TABS: 1.0000 | ORAL_TABLET | Freq: Four times a day (QID) | ORAL | Status: DC | PRN

## 2013-05-02 ENCOUNTER — Other Ambulatory Visit: Payer: Self-pay | Admitting: Internal Medicine

## 2013-05-04 IMAGING — CR DG CHEST 2V
2 series · 2 of 2 positions shown · non-contrast
Comparison: 10/05/2011

CLINICAL DATA: Chest pain, cough, history hypertension, stroke,
lupus

CHEST - 2 VIEW

[x chest ap]
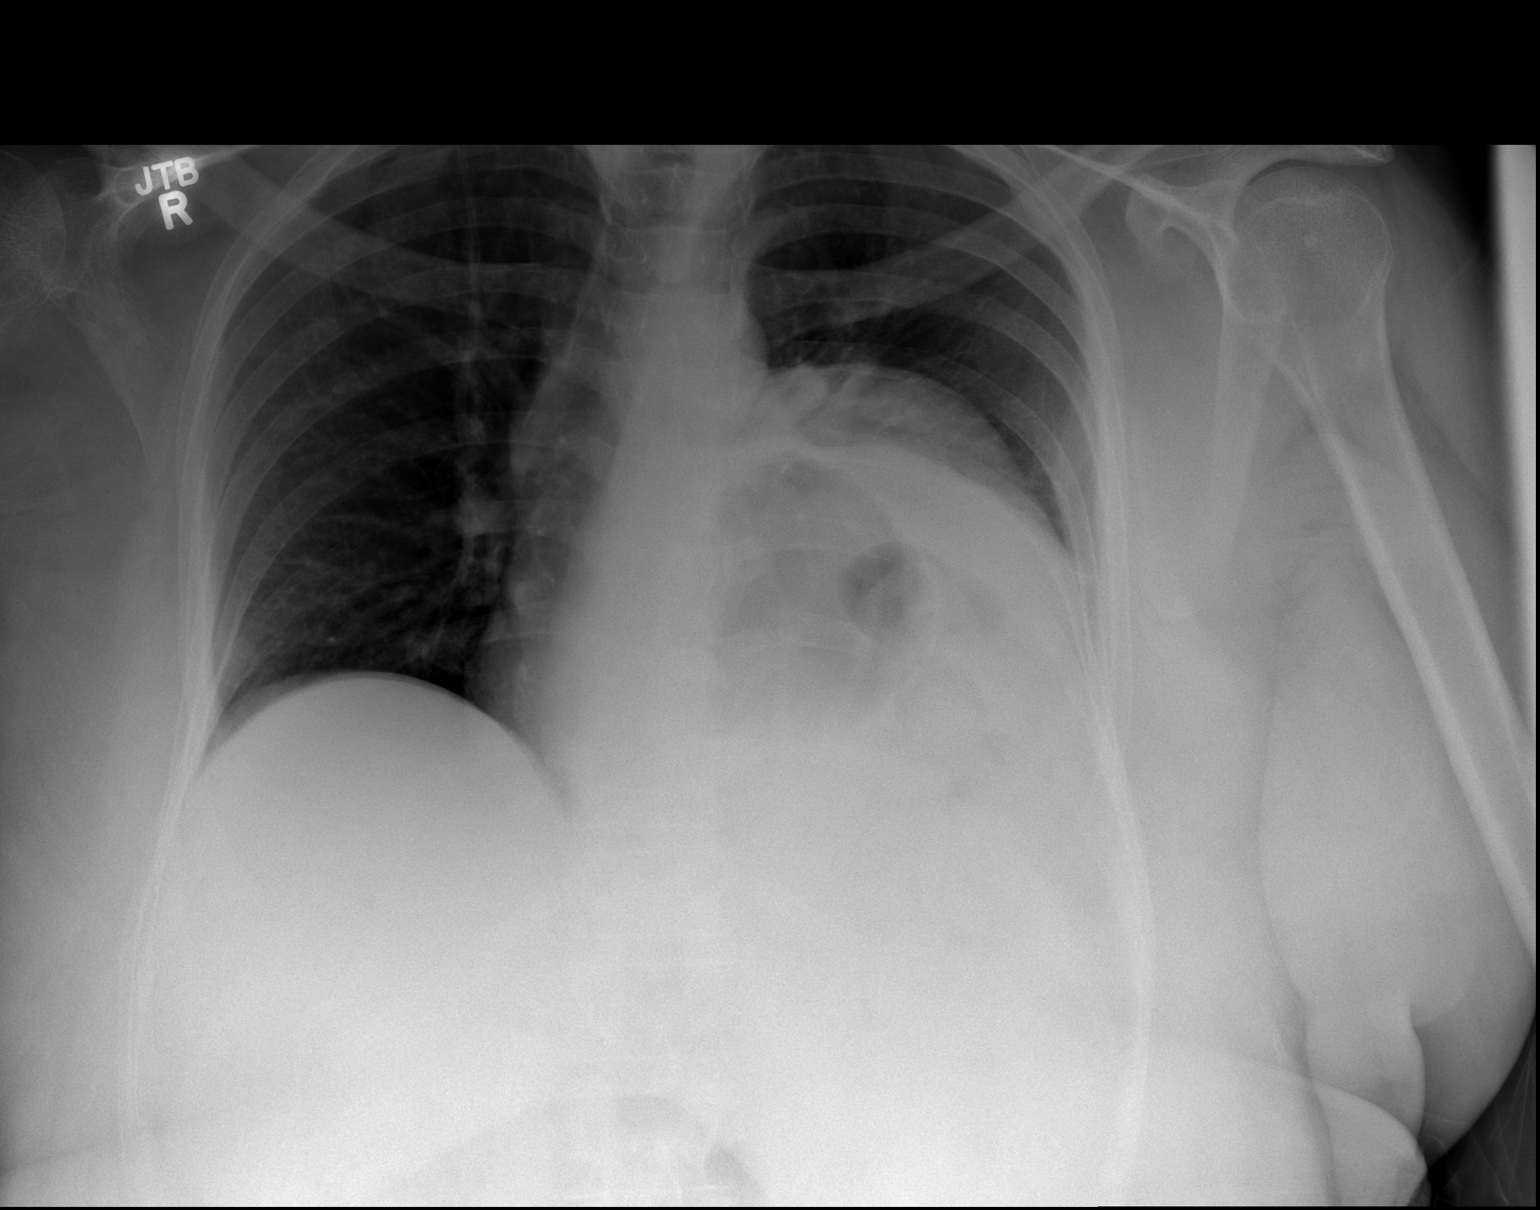

[w chest lat]
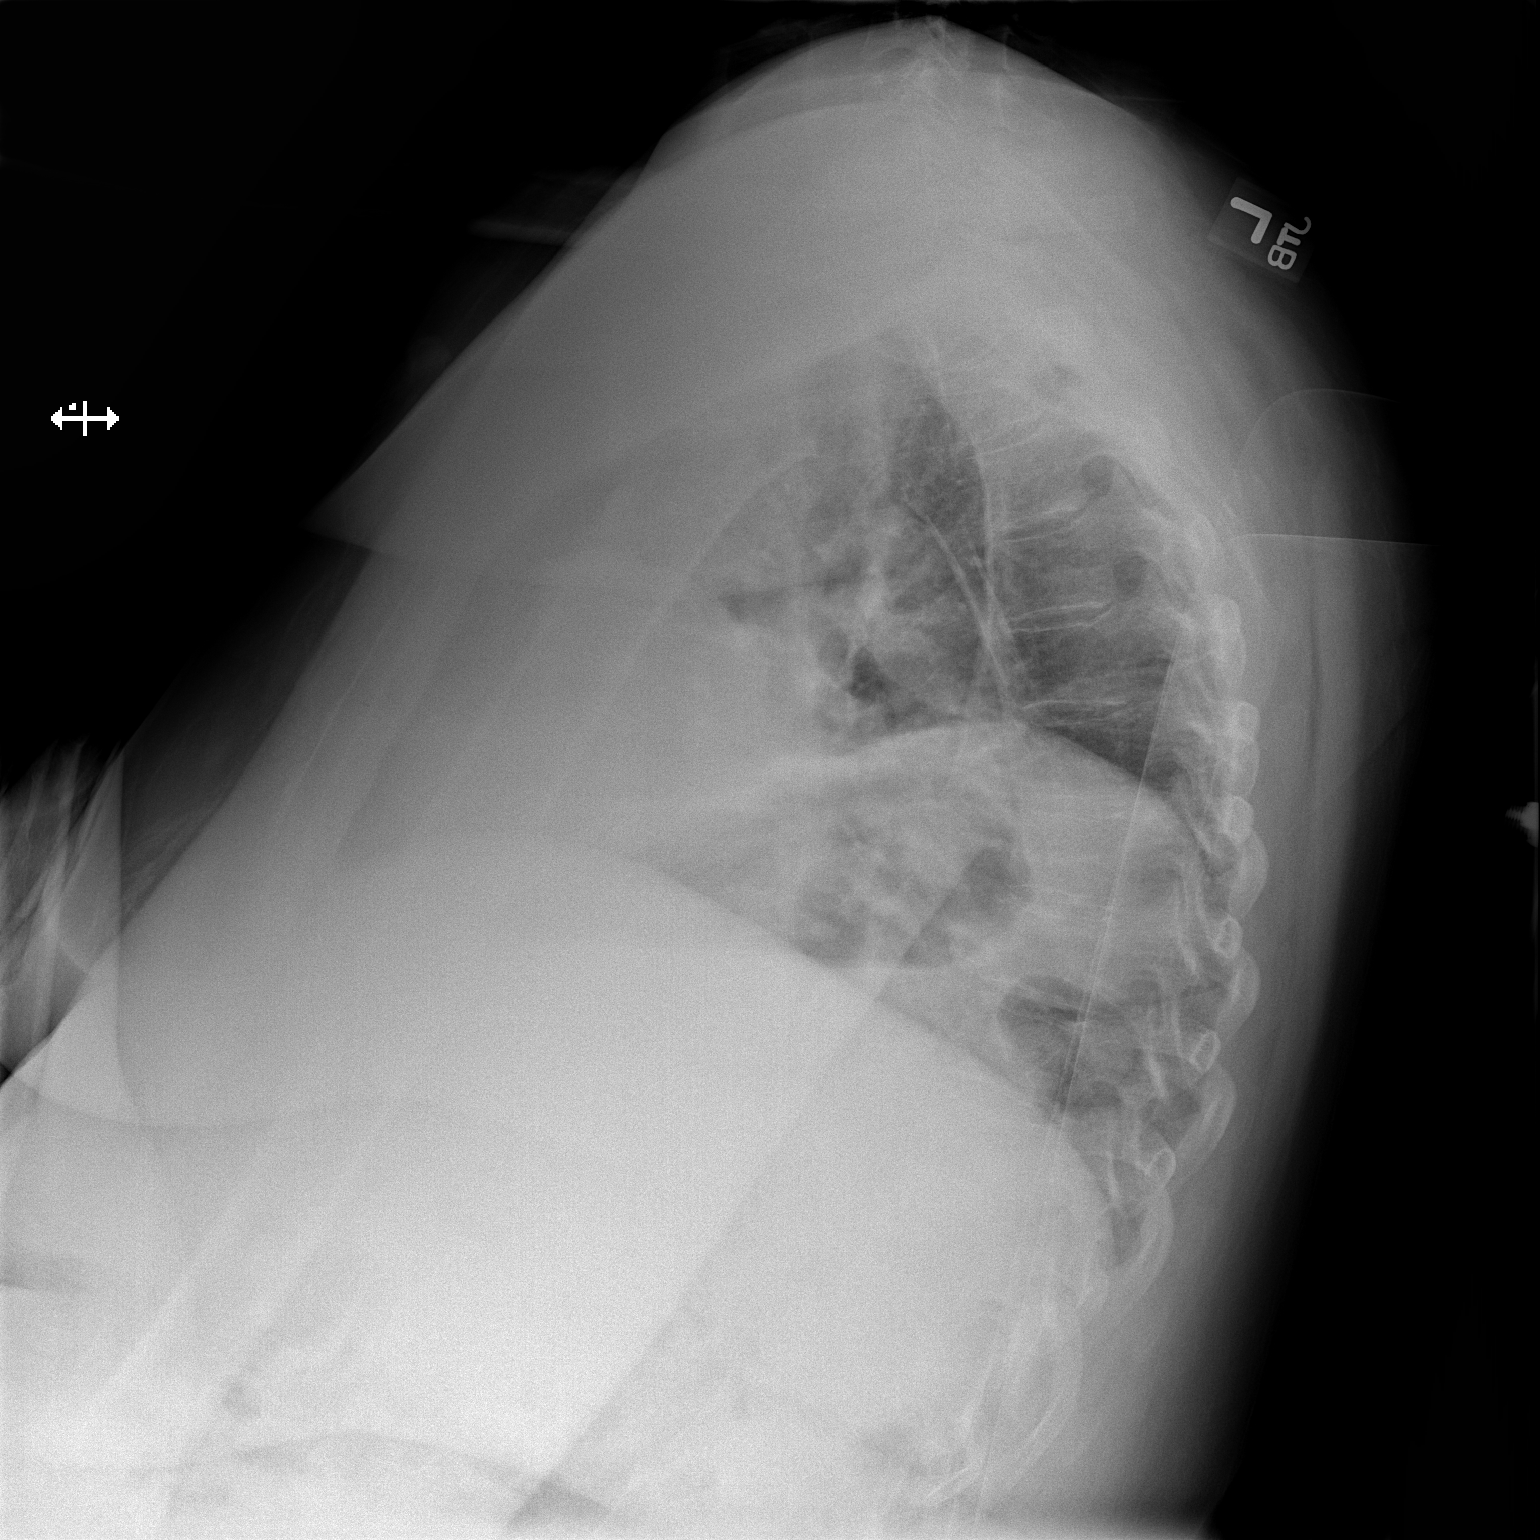

[2 of 2 positions shown; findings below may reference images not displayed]

FINDINGS: Chronic elevation of diaphragm.
Enlargement of cardiac silhouette.
Mediastinal contours and pulmonary vascularity otherwise normal.
Chronic left basilar atelectasis.
Lungs otherwise clear.
No pleural effusion or pneumothorax.
VP shunt tubing traverses right chest.
No acute osseous findings.
IMPRESSION: Marked elevation of left diaphragm with chronic left basilar
atelectasis.
Enlargement of cardiac silhouette.

## 2013-05-19 ENCOUNTER — Telehealth: Payer: Self-pay | Admitting: *Deleted

## 2013-05-19 ENCOUNTER — Ambulatory Visit (HOSPITAL_COMMUNITY)
Admission: RE | Admit: 2013-05-19 | Discharge: 2013-05-19 | Disposition: A | Discharge status: Home / Self Care | Payer: Medicare Other | Source: Ambulatory Visit | Attending: Internal Medicine | Admitting: Internal Medicine

## 2013-05-19 DIAGNOSIS — Z0000 Encounter for general adult medical examination without abnormal findings: Secondary | ICD-10-CM

## 2013-05-19 DIAGNOSIS — N644 Mastodynia: Secondary | ICD-10-CM | POA: Insufficient documentation

## 2013-05-19 NOTE — Telephone Encounter (Signed)
Pt's care giver, Wille CelesteJanie came by clinic to report pt has tried OTC Monistat with no improvement. Pt is c/o dark urine wit order.  Also frequency.  Will you order an antibiotic?  Pt [facility] on [address]. Contact # [phone]  Also [facility] .  Pt has soreness to left side of breast so they [facility].  Will you please order this for pt.  Needs to be done by 1/5

## 2013-05-19 NOTE — Telephone Encounter (Signed)
Spoke with Inocencio HomesGayle.  Will defer decision on Abx to PCP, Dr. Sherrine MaplesGlenn.  If possible, patient could bring in urine sample for evaluation of odor and darkness, otherwise will need to be seen prior to abx Rx.    Have ordered Mammogram to [facility] center.

## 2013-05-22 ENCOUNTER — Telehealth: Payer: Self-pay | Admitting: *Deleted

## 2013-05-22 ENCOUNTER — Other Ambulatory Visit: Payer: Self-pay | Admitting: Internal Medicine

## 2013-05-22 NOTE — Telephone Encounter (Signed)
Family calling for pt - problems with vomiting, diarrhea and feels weak. Denies temperature. All going on past 3 days. Family states pt on liquids only and doing ok with phenergan by mouth. Diarrhea is about every 2 hours. Suggest to go to ER or have appt this afternoon. Family preferred not to go to ER or appt. Suggest Imodium OTC for diarrhea - will call back if no change. Stanton KidneyDebra Dericka Ostenson RN 05/22/13 9AM

## 2013-05-23 ENCOUNTER — Other Ambulatory Visit: Payer: Self-pay | Admitting: Internal Medicine

## 2013-05-23 DIAGNOSIS — N644 Mastodynia: Secondary | ICD-10-CM

## 2013-05-24 NOTE — Telephone Encounter (Signed)
Pt called and informed [facility] appointment. Care Giver states pt's c/o dark urine has improved along with the nausea she was having. They will call back if needed.

## 2013-05-25 ENCOUNTER — Other Ambulatory Visit: Payer: Self-pay | Admitting: *Deleted

## 2013-05-25 MED ORDER — HYDROCHLOROTHIAZIDE 12.5 MG PO TABS: 12.5000 mg | ORAL_TABLET | Freq: Every day | ORAL | Status: DC

## 2013-05-25 NOTE — Telephone Encounter (Signed)
Refill done.  

## 2013-06-08 ENCOUNTER — Ambulatory Visit
Admission: RE | Admit: 2013-06-08 | Discharge: 2013-06-08 | Disposition: A | Discharge status: Home / Self Care | Payer: Medicare Other | Source: Ambulatory Visit | Attending: Internal Medicine | Admitting: Internal Medicine

## 2013-06-08 DIAGNOSIS — N644 Mastodynia: Secondary | ICD-10-CM

## 2013-06-08 DIAGNOSIS — R928 Other abnormal and inconclusive findings on diagnostic imaging of breast: Secondary | ICD-10-CM | POA: Diagnosis not present

## 2013-06-11 ENCOUNTER — Other Ambulatory Visit (HOSPITAL_COMMUNITY): Payer: Self-pay | Admitting: Internal Medicine

## 2013-06-11 DIAGNOSIS — Z8679 Personal history of other diseases of the circulatory system: Secondary | ICD-10-CM

## 2013-06-21 ENCOUNTER — Other Ambulatory Visit: Payer: Self-pay | Admitting: Internal Medicine

## 2013-07-18 ENCOUNTER — Telehealth: Payer: Self-pay | Admitting: *Deleted

## 2013-07-18 DIAGNOSIS — Z8679 Personal history of other diseases of the circulatory system: Secondary | ICD-10-CM

## 2013-07-18 DIAGNOSIS — G8191 Hemiplegia, unspecified affecting right dominant side: Secondary | ICD-10-CM | POA: Insufficient documentation

## 2013-07-18 NOTE — Telephone Encounter (Signed)
Call from Shantell, customer support, [phone]- [phone] ex. [phone] She is asking for a Rx for a "Wheelchair Cushion". Needs Dx code Chair 18 X 18.  Fax to [phone]

## 2013-07-24 ENCOUNTER — Emergency Department (HOSPITAL_COMMUNITY)
Admission: EM | Admit: 2013-07-24 | Discharge: 2013-07-24 | Disposition: A | Discharge status: Home / Self Care | Payer: Medicare Other | Attending: Emergency Medicine | Admitting: Emergency Medicine

## 2013-07-24 ENCOUNTER — Emergency Department (HOSPITAL_COMMUNITY): Payer: Medicare Other

## 2013-07-24 ENCOUNTER — Encounter (HOSPITAL_COMMUNITY): Payer: Self-pay | Admitting: Emergency Medicine

## 2013-07-24 ENCOUNTER — Telehealth: Payer: Self-pay | Admitting: *Deleted

## 2013-07-24 DIAGNOSIS — K219 Gastro-esophageal reflux disease without esophagitis: Secondary | ICD-10-CM | POA: Diagnosis not present

## 2013-07-24 DIAGNOSIS — J9819 Other pulmonary collapse: Secondary | ICD-10-CM | POA: Diagnosis not present

## 2013-07-24 DIAGNOSIS — M899 Disorder of bone, unspecified: Secondary | ICD-10-CM | POA: Diagnosis not present

## 2013-07-24 DIAGNOSIS — R195 Other fecal abnormalities: Secondary | ICD-10-CM

## 2013-07-24 DIAGNOSIS — Z862 Personal history of diseases of the blood and blood-forming organs and certain disorders involving the immune mechanism: Secondary | ICD-10-CM | POA: Diagnosis not present

## 2013-07-24 DIAGNOSIS — F3289 Other specified depressive episodes: Secondary | ICD-10-CM | POA: Insufficient documentation

## 2013-07-24 DIAGNOSIS — R300 Dysuria: Secondary | ICD-10-CM | POA: Insufficient documentation

## 2013-07-24 DIAGNOSIS — K921 Melena: Secondary | ICD-10-CM | POA: Insufficient documentation

## 2013-07-24 DIAGNOSIS — I498 Other specified cardiac arrhythmias: Secondary | ICD-10-CM | POA: Insufficient documentation

## 2013-07-24 DIAGNOSIS — Z87891 Personal history of nicotine dependence: Secondary | ICD-10-CM | POA: Diagnosis not present

## 2013-07-24 DIAGNOSIS — I69959 Hemiplegia and hemiparesis following unspecified cerebrovascular disease affecting unspecified side: Secondary | ICD-10-CM | POA: Diagnosis not present

## 2013-07-24 DIAGNOSIS — I69920 Aphasia following unspecified cerebrovascular disease: Secondary | ICD-10-CM | POA: Insufficient documentation

## 2013-07-24 DIAGNOSIS — R1013 Epigastric pain: Secondary | ICD-10-CM | POA: Insufficient documentation

## 2013-07-24 DIAGNOSIS — F329 Major depressive disorder, single episode, unspecified: Secondary | ICD-10-CM | POA: Insufficient documentation

## 2013-07-24 DIAGNOSIS — I10 Essential (primary) hypertension: Secondary | ICD-10-CM | POA: Diagnosis not present

## 2013-07-24 DIAGNOSIS — Z880 Allergy status to penicillin: Secondary | ICD-10-CM | POA: Insufficient documentation

## 2013-07-24 DIAGNOSIS — Z9889 Other specified postprocedural states: Secondary | ICD-10-CM | POA: Diagnosis not present

## 2013-07-24 DIAGNOSIS — Z7982 Long term (current) use of aspirin: Secondary | ICD-10-CM | POA: Diagnosis not present

## 2013-07-24 DIAGNOSIS — G40909 Epilepsy, unspecified, not intractable, without status epilepticus: Secondary | ICD-10-CM | POA: Diagnosis not present

## 2013-07-24 DIAGNOSIS — R197 Diarrhea, unspecified: Secondary | ICD-10-CM | POA: Insufficient documentation

## 2013-07-24 DIAGNOSIS — M949 Disorder of cartilage, unspecified: Secondary | ICD-10-CM | POA: Diagnosis not present

## 2013-07-24 DIAGNOSIS — M329 Systemic lupus erythematosus, unspecified: Secondary | ICD-10-CM | POA: Insufficient documentation

## 2013-07-24 DIAGNOSIS — Z90710 Acquired absence of both cervix and uterus: Secondary | ICD-10-CM | POA: Insufficient documentation

## 2013-07-24 DIAGNOSIS — E869 Volume depletion, unspecified: Secondary | ICD-10-CM | POA: Diagnosis not present

## 2013-07-24 DIAGNOSIS — Z79899 Other long term (current) drug therapy: Secondary | ICD-10-CM | POA: Insufficient documentation

## 2013-07-24 LAB — COMPREHENSIVE METABOLIC PANEL
ALT: 18 U/L (ref 0–35)
AST: 19 U/L (ref 0–37)
Albumin: 3.4 g/dL — ABNORMAL LOW (ref 3.5–5.2)
Alkaline Phosphatase: 92 U/L (ref 39–117)
BUN: 10 mg/dL (ref 6–23)
CALCIUM: 9.2 mg/dL (ref 8.4–10.5)
CO2: 28 meq/L (ref 19–32)
Chloride: 99 mEq/L (ref 96–112)
Creatinine, Ser: 0.65 mg/dL (ref 0.50–1.10)
GLUCOSE: 93 mg/dL (ref 70–99)
Potassium: 3.9 mEq/L (ref 3.7–5.3)
SODIUM: 138 meq/L (ref 137–147)
TOTAL PROTEIN: 7.8 g/dL (ref 6.0–8.3)
Total Bilirubin: 0.3 mg/dL (ref 0.3–1.2)

## 2013-07-24 LAB — URINALYSIS, ROUTINE W REFLEX MICROSCOPIC
Bilirubin Urine: NEGATIVE
GLUCOSE, UA: NEGATIVE mg/dL
HGB URINE DIPSTICK: NEGATIVE
KETONES UR: NEGATIVE mg/dL
Nitrite: NEGATIVE
PROTEIN: NEGATIVE mg/dL
Specific Gravity, Urine: 1.01 (ref 1.005–1.030)
UROBILINOGEN UA: 0.2 mg/dL (ref 0.0–1.0)
pH: 6 (ref 5.0–8.0)

## 2013-07-24 LAB — CBC WITH DIFFERENTIAL/PLATELET
BASOS ABS: 0 10*3/uL (ref 0.0–0.1)
Basophils Relative: 0 % (ref 0–1)
EOS ABS: 0.1 10*3/uL (ref 0.0–0.7)
Eosinophils Relative: 2 % (ref 0–5)
HEMATOCRIT: 37.7 % (ref 36.0–46.0)
Hemoglobin: 11.7 g/dL — ABNORMAL LOW (ref 12.0–15.0)
Lymphocytes Relative: 36 % (ref 12–46)
Lymphs Abs: 1.7 10*3/uL (ref 0.7–4.0)
MCH: 23.9 pg — AB (ref 26.0–34.0)
MCHC: 31 g/dL (ref 30.0–36.0)
MCV: 77.1 fL — ABNORMAL LOW (ref 78.0–100.0)
MONO ABS: 0.5 10*3/uL (ref 0.1–1.0)
Monocytes Relative: 10 % (ref 3–12)
Neutro Abs: 2.5 10*3/uL (ref 1.7–7.7)
Neutrophils Relative %: 51 % (ref 43–77)
PLATELETS: 359 10*3/uL (ref 150–400)
RBC: 4.89 MIL/uL (ref 3.87–5.11)
RDW: 16.7 % — AB (ref 11.5–15.5)
WBC: 4.8 10*3/uL (ref 4.0–10.5)

## 2013-07-24 LAB — URINE MICROSCOPIC-ADD ON

## 2013-07-24 LAB — TYPE AND SCREEN
ABO/RH(D): A POS
ANTIBODY SCREEN: NEGATIVE

## 2013-07-24 LAB — LIPASE, BLOOD: Lipase: 12 U/L (ref 11–59)

## 2013-07-24 LAB — I-STAT TROPONIN, ED: Troponin i, poc: 0 ng/mL (ref 0.00–0.08)

## 2013-07-24 LAB — ABO/RH: ABO/RH(D): A POS

## 2013-07-24 LAB — POC OCCULT BLOOD, ED: FECAL OCCULT BLD: POSITIVE — AB

## 2013-07-24 MED ORDER — HYDROCODONE-ACETAMINOPHEN 5-325 MG PO TABS
1.0000 | ORAL_TABLET | Freq: Once | ORAL | Status: AC
  Administered 2013-07-24: 1 via ORAL
  Filled 2013-07-24: qty 1

## 2013-07-24 MED ORDER — IPRATROPIUM BROMIDE 0.02 % IN SOLN
0.5000 mg | Freq: Once | RESPIRATORY_TRACT | Status: AC
  Administered 2013-07-24: 0.5 mg via RESPIRATORY_TRACT
  Filled 2013-07-24: qty 2.5

## 2013-07-24 MED ORDER — ALBUTEROL SULFATE (2.5 MG/3ML) 0.083% IN NEBU
5.0000 mg | INHALATION_SOLUTION | Freq: Once | RESPIRATORY_TRACT | Status: AC
  Administered 2013-07-24: 5 mg via RESPIRATORY_TRACT
  Filled 2013-07-24: qty 6

## 2013-07-24 NOTE — ED Notes (Signed)
[facility] arrived to pick up patient. Son told she was coming home and reported he would be there.

## 2013-07-24 NOTE — ED Provider Notes (Signed)
Medical screening examination/treatment/procedure(s) were conducted as a shared visit with non-physician practitioner(s) and myself.  I personally evaluated the patient during the encounter.   EKG Interpretation   Date/Time:  Monday July 24 2013 11:15:17 EDT Ventricular Rate:  82 PR Interval:  144 QRS Duration: 92 QT Interval:  368 QTC Calculation: 430 R Axis:   17 Text Interpretation:  Sinus rhythm Artifact No significant change since  last tracing except nonspecific t wave changes lateral leads Confirmed by  Decarlos Empey  MD-J, Kina Shiffman ([id number]) on 07/24/2013 11:22:37 AM      [facility] guidance.  Unsuccessful attempt.  Several attempts were made.  Pt did not appear dehydrated.  Vitals and labs unremarkable.   No vomiting or diarrhea here in the ED.  At this time there does not appear to be any evidence of an acute emergency medical condition and the patient appears stable for discharge with appropriate outpatient follow up.   Celene KrasJon R Lenny Bouchillon, MD 07/25/13 [phone]

## 2013-07-24 NOTE — Telephone Encounter (Signed)
Caregiver Janie called and reports pt has had runny stool for 8 days.   Not drinking well, not eating much.  Looks pale. Pt very weak.  Has tried imodium without effect.   Caregiver sent pt to ED prior to my return call.

## 2013-07-24 NOTE — ED Notes (Signed)
Pts son and daughter would like to be called and updated on the plan of care.  SonJoseph Pierini- Jason Rueger- [phone] Daughter- Jill SideJannie Dark- [phone]

## 2013-07-24 NOTE — ED Notes (Signed)
2 RNs attempted to obtain IV access but unable to do so. PA aware and IV team paged.

## 2013-07-24 NOTE — ED Provider Notes (Signed)
CSN: [id number]     Arrival date & time 07/24/13  1102 History   First MD Initiated Contact with Patient 07/24/13 1103     Chief Complaint  Patient presents with  . Diarrhea     (Consider location/radiation/quality/duration/timing/severity/associated sxs/prior Treatment) Patient is a 58 y.o. female presenting with diarrhea. The history is provided by the patient and medical records.  Diarrhea  Level 5 Caveat:  Baseline expressive aphasia from prior CVA.  Pt can shake head yes and no when questioned.  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  Patient states she recently started noticing some blood in her stool there is bright red in color. Patient does have a history of prior GI bleed, she is currently on aspirin but no other anticoagulants. She endorses some mild epigastric pain without associated nausea or vomiting. She denies fever or chills.  No recent abx use.  Pt also notes some mild dysuria.  Last colonoscopy in 2008 for further evaluation of heme + stools, results unavailable.  Past Medical History  Diagnosis Date  . Depression   . GERD (gastroesophageal reflux disease)   . Hypertension   . 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 SLE (systemic lupus erythematosus)   . Venous insufficiency (chronic) (peripheral)   . Osteopenia   . ANEMIA-IRON DEFICIENCY 03/02/2006    H&H 8.5/25.9 5/09. On 5/13 12.8/39.7 with MCV 80.4.     Marland Kitchen CEREBROVASCULAR ACCIDENT, HX OF 03/02/2006    Ruptured berry aneurysm, right hemiparesis & expressive aphasia     . SVT (supraventricular tachycardia)     a. 03/2013 with mildly elevated troponin at that time.   Past Surgical History  Procedure Laterality Date  . Total abdominal hysterectomy  5/09    Benign cervix,  uterine fibroids on bx  . Cerebral aneurysm repair      1990's  . Esophagogastroduodenoscopy  2001    Varices and esophagitis   Family History  Problem Relation Age of Onset  . Heart attack Brother 60   History  Substance Use Topics  . Smoking status: Former Smoker -- 1.50 packs/day    Types: Cigarettes    Quit date: 05/30/2003  . Smokeless tobacco: Never Used     Comment: she smoked for 30 years before she quit  . Alcohol Use: No   OB History   Grav Para Term Preterm Abortions TAB SAB Ect Mult Living                 Review of Systems  Unable to perform ROS: Patient nonverbal      Allergies  Lasix; Penicillins; and Sulfur  Home Medications   Current Outpatient Rx  Name  Route  Sig  Dispense  Refill  . albuterol (PROVENTIL) (5 MG/ML) 0.5% nebulizer solution   Nebulization   Take 0.5 mLs (2.5 mg total) by nebulization every 4 (four) hours as needed. For shortness of breath   20 mL   3   . aspirin 81 MG tablet   Oral   Take 81 mg by mouth daily.           Marland Kitchen atorvastatin (LIPITOR) 40 MG tablet      TAKE 1 TABLET (40 MG TOTAL) BY MOUTH DAILY.   30 tablet   6   . calcium-vitamin  D (OSCAL) 250-125 MG-UNIT per tablet   Oral   Take 1 tablet by mouth daily.           . Cholecalciferol (REPLESTA) 50000 UNITS WAFR   Oral   Take 50,000 Units by mouth every 7 (seven) days. On wednesdays         . denosumab (PROLIA) 60 MG/ML SOLN injection   Subcutaneous   Inject 60 mg into the skin every 6 (six) months. Administer in upper arm, thigh, or abdomen      0   . gabapentin (NEURONTIN) 300 MG capsule      TAKE 1 CAPSULE (300 MG TOTAL) BY MOUTH 3 (THREE) TIMES DAILY.   270 capsule   3   . hydrochlorothiazide (HYDRODIURIL) 12.5 MG tablet   Oral   Take 1 tablet (12.5 mg total) by mouth daily.   30 tablet   6   . HYDROcodone-acetaminophen (NORCO/VICODIN) 5-325 MG per tablet   Oral   Take 1 tablet by mouth every 6 (six) hours as needed for moderate pain.    30 tablet   0   . KLOR-CON 10 10 MEQ tablet      TAKE 1 TABLET EVERY DAY WHILE TAKING FUROSEMIDE   30 tablet   1   . levETIRAcetam (KEPPRA) 500 MG tablet      TAKE ONE TABLET TWICE DAILY   60 tablet   11   . loratadine (CLARITIN) 10 MG tablet      TAKE 1 TABLET (10 MG TOTAL) BY MOUTH DAILY.   30 tablet   6   . metoprolol (LOPRESSOR) 50 MG tablet   Oral   Take 1.5 tablets (75 mg total) by mouth 2 (two) times daily.   90 tablet   11   . Multiple Vitamins-Minerals (ADULT ONE DAILY GUMMIES) CHEW   Oral   Chew 2 each by mouth daily.         Marland Kitchen. omeprazole (PRILOSEC) 20 MG capsule   Oral   Take 20 mg by mouth daily.         . promethazine (PHENERGAN) 12.5 MG tablet   Oral   Take 1 tablet (12.5 mg total) by mouth every 8 (eight) hours as needed for nausea.   45 tablet   2   . venlafaxine XR (EFFEXOR-XR) 75 MG 24 hr capsule   Oral   Take 75 mg by mouth daily.          BP 127/61  Pulse 81  Temp(Src) 97.8 F (36.6 C) (Oral)  Resp 18  SpO2 99%  Physical Exam  Nursing note and vitals reviewed. Constitutional: She appears well-developed and well-nourished. No distress.  HENT:  Head: Normocephalic and atraumatic.  Mouth/Throat: Oropharynx is clear and moist.  Eyes: Conjunctivae and EOM are normal. Pupils are equal, round, and reactive to light.  Neck: Normal range of motion.  Cardiovascular: Normal rate, regular rhythm and normal heart sounds.   Pulmonary/Chest: Effort normal and breath sounds normal. No respiratory distress. She has no wheezes.  Abdominal: Soft. Bowel sounds are normal. There is no tenderness. There is no rigidity and no guarding.  Abdomen soft, non-distended, no peritoneal signs  Genitourinary: Rectal exam shows no external hemorrhoid and no fissure. Guaiac positive stool.  No external hemorrhoids or anal fissures noted; loose brown stool on glove with small specs of bright red blood; guaiac +  Musculoskeletal: Normal range of motion. She  exhibits no edema.  Neurological: She is alert.  Alert, non-verbal at baseline  but will shake head "yes" and "no" to questions; baseline right hemiparesis; moving left upper and lower extremities spontaneously without ataxia  Skin: Skin is warm and dry. She is not diaphoretic.  Psychiatric: She has a normal mood and affect.  Non-verbal    ED Course  Procedures (including critical care time) Labs Review Labs Reviewed  CBC WITH DIFFERENTIAL - Abnormal; Notable for the following:    Hemoglobin 11.7 (*)    MCV 77.1 (*)    MCH 23.9 (*)    RDW 16.7 (*)    All other components within normal limits  COMPREHENSIVE METABOLIC PANEL - Abnormal; Notable for the following:    Albumin 3.4 (*)    All other components within normal limits  URINALYSIS, ROUTINE W REFLEX MICROSCOPIC - Abnormal; Notable for the following:    Leukocytes, UA SMALL (*)    All other components within normal limits  URINE MICROSCOPIC-ADD ON - Abnormal; Notable for the following:    Squamous Epithelial / LPF FEW (*)    Bacteria, UA FEW (*)    All other components within normal limits  POC OCCULT BLOOD, ED - Abnormal; Notable for the following:    Fecal Occult Bld POSITIVE (*)    All other components within normal limits  LIPASE, BLOOD  I-STAT TROPOININ, ED  TYPE AND SCREEN  ABO/RH   Imaging Review No results found.   EKG Interpretation   Date/Time:  Monday July 24 2013 11:15:17 EDT Ventricular Rate:  82 PR Interval:  144 QRS Duration: 92 QT Interval:  368 QTC Calculation: 430 R Axis:   17 Text Interpretation:  Sinus rhythm Artifact No significant change since  last tracing except nonspecific t wave changes lateral leads Confirmed by  KNAPP  MD-J, JON ([id number]) on 07/24/2013 11:22:37 AM      MDM   Final diagnoses:  Occult blood in stools  Diarrhea   EKG NSR, no acute ischemic changes.  Trop negative.  Labs as above-- guaiac positive, however H/H stable and is improved from previous visits.  Given that  this is a recurrent problem, VS remain stable, feel she can be safely discharged with close PCP FU.  Pt was previously evaluated by [facility], requests new referral as she nods her head that she did not like that facility.  Discussed plan of care with pt who nodded her head that she understood.  Called and spoke with son and discussed plan of care-- he also acknowledged understanding and agreed with plan of care.  Discussed with Dr. Lynelle Doctor who agrees with assessment and plan of care.  Garlon Hatchet, PA-C 07/24/13 1909

## 2013-07-24 NOTE — ED Notes (Signed)
Assisted EDP in attempting to obtain u/s guided IV access. Unable to do so. Lab called and informed of need for lab draw. Will continue to monitor.

## 2013-07-24 NOTE — ED Notes (Signed)
2 IV team RNs attempted to obtain IV access but both were unsuccessful. ED PA, Sharilyn SitesLisa Sanders made aware.

## 2013-07-24 NOTE — Discharge Instructions (Signed)
You do have some blood in your stool, however your blood counts today were normal. Follow-up with [facility] as soon as possible for further management. Return to the ED for new or worsening symptoms.

## 2013-07-24 NOTE — ED Notes (Addendum)
Pt reports to the ED via [facility] from home for eval of diarrhea x 4 days. Pt has hx of expressive aphasia and right sided weakness from a previous CVA and mental status is at baseline. Pt non-verbal at this time which is baseline. Pt denies any pain or N/V. Pt reports some slight bright red blood in her stool and some dysuria. Pt reports hx of GI bleed. Pt denies being on any blood thinners. VSS en route. Pt reports some CP but when asked to point where the pain is she points to her epigastric area. Reports associated SOB and diaphoresis. Pt NSR on the monitor. O2 98% but pt slightly tachypnic. Pt alert and nodding yes or no to questions which is baseline.

## 2013-07-24 NOTE — ED Notes (Signed)
IV team at bedside 

## 2013-07-24 NOTE — ED Notes (Signed)
IV team returned page.  

## 2013-07-25 NOTE — Telephone Encounter (Signed)
She was discharged home from the ED. I spoke to her caregiver who felt that Wanda [name] was not doing well with decreased po intake. She felt that she was dehydrated. Wanda [name] needs to be seen in our clinic this week if possible by any provider. Please schedule her an appointment. Thanks!

## 2013-07-25 NOTE — Telephone Encounter (Signed)
Talked with pt's son.  He will bring pt in tomorrow for OV

## 2013-07-26 ENCOUNTER — Ambulatory Visit (INDEPENDENT_AMBULATORY_CARE_PROVIDER_SITE_OTHER): Payer: Medicare Other | Admitting: Internal Medicine

## 2013-07-26 VITALS — BP 138/79 | HR 74 | Temp 97.9°F | Ht 71.0 in

## 2013-07-26 DIAGNOSIS — M810 Age-related osteoporosis without current pathological fracture: Secondary | ICD-10-CM | POA: Diagnosis not present

## 2013-07-26 DIAGNOSIS — R197 Diarrhea, unspecified: Secondary | ICD-10-CM | POA: Diagnosis not present

## 2013-07-26 DIAGNOSIS — R609 Edema, unspecified: Secondary | ICD-10-CM | POA: Diagnosis not present

## 2013-07-26 DIAGNOSIS — Z0000 Encounter for general adult medical examination without abnormal findings: Secondary | ICD-10-CM | POA: Diagnosis not present

## 2013-07-26 DIAGNOSIS — M329 Systemic lupus erythematosus, unspecified: Secondary | ICD-10-CM | POA: Diagnosis not present

## 2013-07-26 DIAGNOSIS — G4700 Insomnia, unspecified: Secondary | ICD-10-CM | POA: Diagnosis not present

## 2013-07-26 DIAGNOSIS — I10 Essential (primary) hypertension: Secondary | ICD-10-CM | POA: Diagnosis not present

## 2013-07-26 DIAGNOSIS — I498 Other specified cardiac arrhythmias: Secondary | ICD-10-CM | POA: Diagnosis not present

## 2013-07-26 NOTE — Assessment & Plan Note (Addendum)
Hgb from 3/9 stable at baseline, diarrhea Resolved, f/u GI

## 2013-07-26 NOTE — Progress Notes (Signed)
   Subjective:    Patient ID: Wanda [name], female    DOB: 03/02/1956, 58 y.o.   MRN: [id number]  HPI  [name] presents for post-ED follow-up for diarrhea and blood in stools.  She reports that the diarrhea has resolved and that she had a normal bowel movement with out blood yesterday.  Hgb in ED was stable at her baseline of 11.  She denies abdominal pain, no vomiting but states that she has nausea on and off.  Of note, her hx is significant Fe def anemia, hypertension, previous GI bleed, CVA with residual right side hemiparesis wheel chair bound and expressive aphasia (she can speak in few words sentences and nods many answers to questions.  She would like to be seen by a GI doctor. Colonoscopy in 2008 for GI bleed per Dr. Arlyce DiceKaplan of [facility].     Review of Systems  HENT: Negative.   Respiratory: Negative.   Cardiovascular: Negative.   Gastrointestinal: Positive for nausea, vomiting, diarrhea and blood in stool. Negative for abdominal pain and rectal pain.  Endocrine: Negative.   Genitourinary: Negative.   Musculoskeletal: Positive for gait problem.  Skin: Negative.   Neurological: Positive for speech difficulty and weakness. Negative for seizures.       Secondary to stroke  Hematological: Does not bruise/bleed easily.  Psychiatric/Behavioral: Negative.        Objective:   Physical Exam  Constitutional: She is oriented to person, place, and time. She appears well-developed and well-nourished. No distress.  HENT:  Head: Normocephalic and atraumatic.  Eyes: Conjunctivae and EOM are normal. Pupils are equal, round, and reactive to light.  Neck: Normal range of motion.  Cardiovascular: Normal rate, regular rhythm, normal heart sounds and intact distal pulses.   Pulmonary/Chest: Effort normal and breath sounds normal.  Abdominal: Soft. Bowel sounds are normal. She exhibits no distension. There is no tenderness.  Musculoskeletal: She exhibits no edema and no tenderness.    Neurological: She is alert and oriented to person, place, and time.  Skin: Skin is warm and dry.  Psychiatric: She has a normal mood and affect.          Assessment & Plan:  See separate problem-list charting:

## 2013-07-26 NOTE — Patient Instructions (Signed)
We will refer you to another [facility] as you requested. If your diarrhea returns before this appointment, please contact the clinic.

## 2013-07-27 NOTE — Progress Notes (Signed)
Case discussed with Dr. Schooler at the time of the visit.  We reviewed the resident's history and exam and pertinent patient test results.  I agree with the assessment, diagnosis, and plan of care documented in the resident's note.     

## 2013-07-28 ENCOUNTER — Encounter: Payer: Self-pay | Admitting: Internal Medicine

## 2013-08-03 ENCOUNTER — Other Ambulatory Visit: Payer: Self-pay | Admitting: Internal Medicine

## 2013-08-10 ENCOUNTER — Other Ambulatory Visit: Payer: Self-pay | Admitting: Internal Medicine

## 2013-08-10 DIAGNOSIS — K921 Melena: Secondary | ICD-10-CM | POA: Insufficient documentation

## 2013-08-15 ENCOUNTER — Encounter: Payer: Self-pay | Admitting: Gastroenterology

## 2013-08-16 ENCOUNTER — Other Ambulatory Visit: Payer: Self-pay | Admitting: Internal Medicine

## 2013-08-29 ENCOUNTER — Other Ambulatory Visit: Payer: Self-pay | Admitting: Internal Medicine

## 2013-08-30 ENCOUNTER — Telehealth: Payer: Self-pay | Admitting: *Deleted

## 2013-08-30 NOTE — Telephone Encounter (Signed)
Call from care giver wanting to inform us that pt has increase fluid in both feet and legs ( up to knee)  She has noticed this for past 2 1/2 weeks.  She also started exercising pt 2 1/2 weeks ago.  Walks her around. Caregiver feels pt is more SOB, and takes inhaler more often but does get relief after use. They do not weigh pt at home. She is also c/o pain to both legs.  It's constant and not relieved with IBU.  Pt has a scheduled appointment tomorrow with Dr Sherrine MaplesGlenn I will forward this note to her.  I offered an appointment today in clinic but they are not able to come in today.  Hx: CVA with residual right side hemiparesis wheel chair bound

## 2013-08-31 ENCOUNTER — Ambulatory Visit (INDEPENDENT_AMBULATORY_CARE_PROVIDER_SITE_OTHER): Payer: Medicare Other | Admitting: Internal Medicine

## 2013-08-31 ENCOUNTER — Other Ambulatory Visit: Payer: Self-pay | Admitting: Internal Medicine

## 2013-08-31 VITALS — BP 135/85 | HR 70 | Temp 96.7°F

## 2013-08-31 DIAGNOSIS — R32 Unspecified urinary incontinence: Secondary | ICD-10-CM

## 2013-08-31 DIAGNOSIS — J302 Other seasonal allergic rhinitis: Secondary | ICD-10-CM

## 2013-08-31 DIAGNOSIS — N390 Urinary tract infection, site not specified: Secondary | ICD-10-CM | POA: Insufficient documentation

## 2013-08-31 DIAGNOSIS — R609 Edema, unspecified: Secondary | ICD-10-CM

## 2013-08-31 DIAGNOSIS — R600 Localized edema: Secondary | ICD-10-CM

## 2013-08-31 DIAGNOSIS — I10 Essential (primary) hypertension: Secondary | ICD-10-CM

## 2013-08-31 DIAGNOSIS — J309 Allergic rhinitis, unspecified: Secondary | ICD-10-CM | POA: Diagnosis not present

## 2013-08-31 DIAGNOSIS — J45909 Unspecified asthma, uncomplicated: Secondary | ICD-10-CM

## 2013-08-31 DIAGNOSIS — I5189 Other ill-defined heart diseases: Secondary | ICD-10-CM | POA: Insufficient documentation

## 2013-08-31 LAB — URINALYSIS, ROUTINE W REFLEX MICROSCOPIC
Bilirubin Urine: NEGATIVE
Glucose, UA: NEGATIVE mg/dL
HGB URINE DIPSTICK: NEGATIVE
Ketones, ur: NEGATIVE mg/dL
NITRITE: NEGATIVE
PROTEIN: NEGATIVE mg/dL
Specific Gravity, Urine: 1.018 (ref 1.005–1.030)
UROBILINOGEN UA: 1 mg/dL (ref 0.0–1.0)
pH: 7.5 (ref 5.0–8.0)

## 2013-08-31 LAB — URINALYSIS, MICROSCOPIC ONLY

## 2013-08-31 MED ORDER — HYDROCODONE-ACETAMINOPHEN 5-325 MG PO TABS: 1.0000 | ORAL_TABLET | Freq: Four times a day (QID) | ORAL | Status: DC | PRN

## 2013-08-31 MED ORDER — HYDROCHLOROTHIAZIDE 25 MG PO TABS: 25.0000 mg | ORAL_TABLET | Freq: Every day | ORAL | Status: DC

## 2013-08-31 MED ORDER — IPRATROPIUM BROMIDE 0.02 % IN SOLN: 0.5000 mg | RESPIRATORY_TRACT | Status: DC | PRN

## 2013-08-31 MED ORDER — CIPROFLOXACIN HCL 500 MG PO TABS: 500.0000 mg | ORAL_TABLET | Freq: Two times a day (BID) | ORAL | Status: AC

## 2013-08-31 NOTE — Assessment & Plan Note (Addendum)
Increased SOB and dyspnea per care giver, primarily with exertion. Some wheezing at times per care giver. Sx improved with albuterol nebulizer treatments. Pt with dx of asthma but unable to find PFTs on file. Per care giver, she is unable to use inhalers, b/c she doesn't understand how to use them properly, even with supervision, after her stroke. Lungs sound clear, but with diminished airflow. Prescribing Atrovent for use with her Albuterol - Duonebs q4-6hrs until she is seen in the clinic on Mon or Tues.  - Return to the clinic early next week for eval of breathing.

## 2013-08-31 NOTE — Assessment & Plan Note (Addendum)
Pt with 2 weeks burning pain with urination, foul smelling, cloudy urine, urinary incontinence, and mood changes. She has a h/o UTIs in the past, most recent urine culture was in Nov, and was positive for E. Coli, sensitive to Cipro. No allergy to Cipro. Will start empiric treatment for complicated UTI with Cipro. _Ciprofloxacin 500mg  BID x10 days - She is to return to the clinic early next week given her generalized malaise, which I am hoping is from this presumed UTI and will improve with abx treatment  Addendum: UA +leukocytes, negative for nitrites. Microscopic + 7-10 WBCs and many bacteria. Apparently the lab just stopped sending UAs for reflex culture earlier this month, fortunately they do still have Wanda [name]'s sample. Urine culture pending.

## 2013-08-31 NOTE — Assessment & Plan Note (Addendum)
Increased BLE edema and pain associated with increased exercise and worse with being on her feet. Last ECHO was in Nov '14 and showed only grade 1 diastolic dysfunction, no crackles on lung exam; trace edema of BLE today. Pt w/o dietary changes. Compression stocking was prescribed previously and is not being used regularly. She was previously placed on Lasix to help with her edema, but had facial edema from this. She is on HCTZ 12.5mg  daily.  - Increasing HCTZ to 25mg  daily, will have to monitor BPs for hypotension - Encouraged use of compression stocking

## 2013-08-31 NOTE — Progress Notes (Signed)
Patient ID: Wanda [name], female   DOB: 01/05/1956, 58 y.o.   MRN: [id number]  Subjective:   Patient ID: Wanda [name] female   DOB: 10/17/1955 58 y.o.   MRN: [id number]  HPI: [name] is a 58 y.o. F w/ PMH significant for stroke with aneurysm rupture and resultant expressive aphasia and R hemiparesis, HTN, tachycardia, seizure disorder, asthma presents for an acute visit for increasing peripheral edema and SOB.  Her caregiver called 4/15 concerned that the patient was having increased BLE edema up to the knees over the past 2 1/2 weeks. It seemed to correlate with starting to exercise the patient and having her ambulate more often.   She also states that Wanda [name] is becoming more SOB at home, which was improved by her inhaler. She is having to use the inhaler more often.   Last ECHO was done 03/18/13 and showed normal systolic fx, EF 60-65%, with grade 1 diastolic dysfunction.   Pt with increased urinary incontinence and some mood changes with forgetfulness and anger towards her family and caregiver. She endorses a burning pain with urination and cloudy, foul smelling urine for the past 2 weeks.     Past Medical History  Diagnosis Date  . Depression   . GERD (gastroesophageal reflux disease)   . Hypertension   . 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 SLE (systemic lupus erythematosus)   . Venous insufficiency (chronic) (peripheral)   . Osteopenia   . ANEMIA-IRON DEFICIENCY 03/02/2006    H&H 8.5/25.9 5/09. On 5/13 12.8/39.7 with MCV 80.4.     Marland Kitchen. CEREBROVASCULAR ACCIDENT, HX OF 03/02/2006    Ruptured berry aneurysm, right hemiparesis & expressive aphasia     . SVT (supraventricular tachycardia)     a. 03/2013 with mildly elevated troponin at that time.   Current Outpatient Prescriptions  Medication Sig Dispense Refill  . albuterol (PROVENTIL) (5 MG/ML) 0.5% nebulizer solution Take 0.5 mLs (2.5 mg total) by nebulization every 4 (four) hours as needed.  For shortness of breath  20 mL  3  . aspirin 81 MG tablet Take 81 mg by mouth daily.        Marland Kitchen. atorvastatin (LIPITOR) 40 MG tablet TAKE 1 TABLET (40 MG TOTAL) BY MOUTH DAILY.  30 tablet  6  . calcium-vitamin D (OSCAL) 250-125 MG-UNIT per tablet Take 1 tablet by mouth daily.        . Cholecalciferol (REPLESTA) 50000 UNITS WAFR Take 50,000 Units by mouth every 7 (seven) days. On wednesdays      . denosumab (PROLIA) 60 MG/ML SOLN injection Inject 60 mg into the skin every 6 (six) months. Administer in upper arm, thigh, or abdomen    0  . gabapentin (NEURONTIN) 300 MG capsule TAKE 1 CAPSULE (300 MG TOTAL) BY MOUTH 3 (THREE) TIMES DAILY.  270 capsule  3  . hydrochlorothiazide (HYDRODIURIL) 12.5 MG tablet Take 1 tablet (12.5 mg total) by mouth daily.  30 tablet  6  . HYDROcodone-acetaminophen (NORCO/VICODIN) 5-325 MG per tablet Take 1 tablet by mouth every 6 (six) hours as needed for moderate pain.  30 tablet  0  . KLOR-CON 10 10 MEQ tablet TAKE 1 TABLET EVERY DAY WHILE TAKING FUROSEMIDE  30 tablet  1  . levETIRAcetam (KEPPRA) 500 MG tablet TAKE ONE TABLET TWICE DAILY  60 tablet  11  . loratadine (CLARITIN) 10 MG tablet TAKE 1 TABLET (10 MG TOTAL) BY MOUTH DAILY.  30 tablet  6  .  metoprolol (LOPRESSOR) 50 MG tablet Take 1.5 tablets (75 mg total) by mouth 2 (two) times daily.  90 tablet  11  . Multiple Vitamins-Minerals (ADULT ONE DAILY GUMMIES) CHEW Chew 2 each by mouth daily.      Marland Kitchen. omeprazole (PRILOSEC) 20 MG capsule TAKE ONE (1) CAPSULE EACH DAY  31 capsule  11  . promethazine (PHENERGAN) 12.5 MG tablet Take 1 tablet (12.5 mg total) by mouth every 8 (eight) hours as needed for nausea.  45 tablet  2  . venlafaxine XR (EFFEXOR-XR) 75 MG 24 hr capsule Take 75 mg by mouth daily.       No current facility-administered medications for this visit.   Family History  Problem Relation Age of Onset  . Heart attack Brother 8660   History   Social History  . Marital Status: Widowed    Spouse Name: N/A     Number of Children: N/A  . Years of Education: N/A   Social History Main Topics  . Smoking status: Former Smoker -- 1.50 packs/day    Types: Cigarettes    Quit date: 05/30/2003  . Smokeless tobacco: Never Used     Comment: she smoked for 30 years before she quit  . Alcohol Use: No  . Drug Use: No  . Sexual Activity: Not on file   Other Topics Concern  . Not on file   Social History Narrative   Has a home nurse,    Lives with her 2 sons,    Former smoker   Widow         Review of Systems: A 12 point ROS was performed; pertinent positives and negatives were noted in the HPI   Objective:  Physical Exam: Filed Vitals:   08/31/13 1325  BP: 135/85  Pulse: 70  Temp: 96.7 F (35.9 C)  TempSrc: Oral  SpO2: 97%   Constitutional: Vital signs reviewed.  Patient is a well-developed and well-nourished female in no acute distress and cooperative with exam.  Head: Normocephalic and atraumatic Eyes: PERRL, EOMI Cardiovascular: RRR, no MRG, pulses symmetric and intact bilaterally. Trace pitting edema of BLE to the knees Pulmonary/Chest: Poor respiratory effort, CTAB, no wheezes, rales, or rhonchi appreciated Abdominal: Soft. Non-tender, non-distended GU: no CVA tenderness Musculoskeletal: R partial hemiparesis.  Neurological: A&O x3, expressive aphagia Skin: Warm, dry and intact.   Psychiatric: Not in her normal happy mood; pt seems like she is not feeling well. Responds appropriately to questioning.   Assessment & Plan:   Please refer to Problem List based Assessment and Plan

## 2013-08-31 NOTE — Patient Instructions (Signed)
I am starting an antibiotic on you for your presumed urinary tract infection.   Begin taking the Albuterol nebulizer along with the Atrovent nebulized solution every 4-6 hours for the next few days, until you are seen in clinic early next week.   I am increasing your HCTZ to 25mg  daily. This should help with the swelling your legs. Be sure to use the compression stockings, applying them 1st thing in the morning.   You can take the Vicodin for pain every 6-8 hours as needed.

## 2013-08-31 NOTE — Assessment & Plan Note (Signed)
Pt with increased runny nose this spring. She is on Loratadine, which does seem to improve her sx, so continuing for now.

## 2013-09-01 NOTE — Telephone Encounter (Signed)
Pt seen in clinic

## 2013-09-02 ENCOUNTER — Other Ambulatory Visit: Payer: Self-pay | Admitting: Internal Medicine

## 2013-09-02 DIAGNOSIS — N390 Urinary tract infection, site not specified: Secondary | ICD-10-CM | POA: Diagnosis not present

## 2013-09-02 DIAGNOSIS — R32 Unspecified urinary incontinence: Secondary | ICD-10-CM | POA: Diagnosis not present

## 2013-09-02 NOTE — Assessment & Plan Note (Addendum)
BP Readings from Last 3 Encounters:  08/31/13 135/85  07/26/13 138/79  07/24/13 134/79    Lab Results  Component Value Date   NA 138 07/24/2013   K 3.9 07/24/2013   CREATININE 0.65 07/24/2013    Assessment: Blood pressure control: controlled Progress toward BP goal:  at goal   Plan: Medications:  continue current medications and am increasing her HCTZ to 25mg  daily 2/2 increased peripheral edema. This will help decrease her pressure more, she will just need to be monitored so that it does not drop too much. Educational resources provided: brochure;handout;video

## 2013-09-03 NOTE — Progress Notes (Signed)
Case discussed with Dr. Glenn soon after the resident saw the patient.  We reviewed the resident's history and exam and pertinent patient test results.  I agree with the assessment, diagnosis, and plan of care documented in the resident's note. 

## 2013-09-04 NOTE — Addendum Note (Signed)
Addended by: Remus BlakeBARROW, Kit Brubacher K on: 09/04/2013 04:17 PM   Modules accepted: Orders

## 2013-09-05 ENCOUNTER — Ambulatory Visit (INDEPENDENT_AMBULATORY_CARE_PROVIDER_SITE_OTHER): Payer: Medicare Other | Admitting: Internal Medicine

## 2013-09-05 ENCOUNTER — Encounter: Payer: Self-pay | Admitting: Internal Medicine

## 2013-09-05 VITALS — BP 145/80 | HR 71 | Temp 97.2°F

## 2013-09-05 DIAGNOSIS — N390 Urinary tract infection, site not specified: Secondary | ICD-10-CM | POA: Diagnosis not present

## 2013-09-05 DIAGNOSIS — I10 Essential (primary) hypertension: Secondary | ICD-10-CM | POA: Diagnosis not present

## 2013-09-05 DIAGNOSIS — J309 Allergic rhinitis, unspecified: Secondary | ICD-10-CM | POA: Diagnosis not present

## 2013-09-05 DIAGNOSIS — R609 Edema, unspecified: Secondary | ICD-10-CM | POA: Diagnosis not present

## 2013-09-05 DIAGNOSIS — J45909 Unspecified asthma, uncomplicated: Secondary | ICD-10-CM

## 2013-09-05 LAB — URINE CULTURE: Colony Count: >=100000

## 2013-09-05 NOTE — Assessment & Plan Note (Signed)
Urine culture did show greater than 100,000 colonies Escherichia coli sensitive to ciprofloxacin. Symptoms have resolved with antibiotic therapy. She is afebrile. Mood and energy level are back to normal per caretaker. - Complete course of ciprofloxacin - Continue to monitor for recurrent signs and symptoms of infection

## 2013-09-05 NOTE — Assessment & Plan Note (Signed)
Shortness of breath has resolved with duo neb therapies. No wheezing at home. No fever, no cough. - Continue duo nebs as needed

## 2013-09-05 NOTE — Patient Instructions (Signed)
Thank you for your visit. - Please finish your antibiotic for urine infection. - Please continue nebulizer treatments for your asthma. - If you develop a fever, pain with urination, worsening shortness of breath, cough, please call the clinic.

## 2013-09-05 NOTE — Progress Notes (Signed)
Case discussed with Dr. Cater at the time of the visit.  We reviewed the resident's history and exam and pertinent patient test results.  I agree with the assessment, diagnosis and plan of care documented in the resident's note. 

## 2013-09-05 NOTE — Progress Notes (Signed)
Subjective:    Patient ID: Wanda [name], female    DOB: 05/26/1955, 58 y.o.   MRN: [id number]  HPI  Wanda PrimesShirley A Sommerfield is a 58 y.o. 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  The patient was seen by Dr. Sherrine MaplesGlenn on 08/31/2013 and noted to have malaise and mood changes. These were felt to be associated with a UTI, as the patient had dysuria and foul-smelling, cloudy urine and a history of recurrent UTI. She was treated with ciprofloxacin 500 mg twice a day x10 days for complicated UTI. Urine culture has resulted and does show greater than 100,000 colonies Escherichia coli sensitive to ciprofloxacin. She also had some shortness of breath at her last visit and was prescribed duo nebs every 4-6 hours for presumed asthma exacerbation. She is following up with me to evaluate her overall status.  Patient is very pleasant, smiling and laughing. Caretaker is at the bedside. She says the patient is back to normal since starting antibiotics. Shortness of breath has resolved. She is no longer having pain with urination. No fevers at home.   Current Outpatient Prescriptions on File Prior to Visit  Medication Sig Dispense Refill  . aspirin 81 MG tablet Take 81 mg by mouth daily.        Marland Kitchen. atorvastatin (LIPITOR) 40 MG tablet TAKE 1 TABLET (40 MG TOTAL) BY MOUTH DAILY.  30 tablet  6  . calcium-vitamin D (OSCAL) 250-125 MG-UNIT per tablet Take 1 tablet by mouth daily.        . Cholecalciferol (REPLESTA) 50000 UNITS WAFR Take 50,000 Units by mouth every 7 (seven) days. On wednesdays      . denosumab (PROLIA) 60 MG/ML SOLN injection Inject 60 mg into the skin every 6 (six) months. Administer in upper arm, thigh, or abdomen    0  . gabapentin (NEURONTIN) 300 MG capsule TAKE 1 CAPSULE (300 MG TOTAL) BY MOUTH 3 (THREE) TIMES DAILY.  270 capsule  3  . hydrochlorothiazide  (HYDRODIURIL) 25 MG tablet Take 1 tablet (25 mg total) by mouth daily.  30 tablet  6  . HYDROcodone-acetaminophen (NORCO/VICODIN) 5-325 MG per tablet Take 1 tablet by mouth every 6 (six) hours as needed for moderate pain.  60 tablet  0  . ipratropium (ATROVENT) 0.02 % nebulizer solution Take 2.5 mLs (0.5 mg total) by nebulization every 4 (four) hours as needed for wheezing or shortness of breath.  75 mL  3  . levETIRAcetam (KEPPRA) 500 MG tablet TAKE ONE TABLET TWICE DAILY  60 tablet  11  . loratadine (CLARITIN) 10 MG tablet TAKE 1 TABLET (10 MG TOTAL) BY MOUTH DAILY.  30 tablet  6  . metoprolol (LOPRESSOR) 50 MG tablet Take 1.5 tablets (75 mg total) by mouth 2 (two) times daily.  90 tablet  11  . Multiple Vitamins-Minerals (ADULT ONE DAILY GUMMIES) CHEW Chew 2 each by mouth daily.      Marland Kitchen. omeprazole (PRILOSEC) 20 MG capsule TAKE ONE (1) CAPSULE EACH DAY  31 capsule  11  . potassium chloride (K-DUR) 10 MEQ tablet TAKE 1 TABLET EVERY DAY WHILE TAKING FUROSEMIDE  30 tablet  5  . promethazine (PHENERGAN) 12.5 MG tablet Take 1 tablet (12.5 mg total) by mouth every 8 (eight) hours as needed for nausea.  45 tablet  2  . venlafaxine XR (EFFEXOR-XR) 75 MG 24 hr capsule Take 75 mg by mouth daily.  Review of Systems  Constitutional: Negative for fever and chills.  Respiratory: Negative for cough, shortness of breath and wheezing.   Gastrointestinal: Negative for abdominal pain.  Genitourinary: Negative for dysuria.      Objective:   Physical Exam  Constitutional: She appears well-developed and well-nourished. No distress.  Very pleasant, laughing, smiling.  Neck: Normal range of motion. Neck supple.  Cardiovascular: Normal rate, regular rhythm and normal heart sounds.  Exam reveals no gallop and no friction rub.   No murmur heard. Pulmonary/Chest: Effort normal and breath sounds normal. No respiratory distress. She has no wheezes. She has no rales. She exhibits no tenderness.  Abdominal: Soft.  There is no tenderness.  Skin: Skin is warm and dry.    Filed Vitals:   09/05/13 0950  BP: 145/80  Pulse: 71  Temp: 97.2 F (36.2 C)      Assessment & Plan:   Please see problem based charting.

## 2013-09-25 ENCOUNTER — Other Ambulatory Visit: Payer: Self-pay | Admitting: Internal Medicine

## 2013-10-04 ENCOUNTER — Ambulatory Visit: Payer: Medicare Other | Admitting: Gastroenterology

## 2013-10-10 ENCOUNTER — Other Ambulatory Visit: Payer: Self-pay | Admitting: Internal Medicine

## 2014-01-11 ENCOUNTER — Ambulatory Visit: Payer: Medicare Other | Admitting: Internal Medicine

## 2014-01-13 ENCOUNTER — Other Ambulatory Visit: Payer: Self-pay | Admitting: Internal Medicine

## 2014-02-07 ENCOUNTER — Encounter: Payer: Self-pay | Admitting: Internal Medicine

## 2014-02-07 ENCOUNTER — Encounter: Payer: Medicare Other | Admitting: Internal Medicine

## 2014-02-22 ENCOUNTER — Ambulatory Visit: Payer: Medicare Other

## 2014-02-22 ENCOUNTER — Ambulatory Visit: Payer: Medicare Other | Admitting: Internal Medicine

## 2014-02-22 NOTE — Progress Notes (Signed)
Patient ID: Wanda PrimesShirley A Yakubov, female   DOB: 04/25/1956, 58 y.o.   MRN: [id number] Erroneous encounter

## 2014-02-22 NOTE — Progress Notes (Deleted)
Subjective:   Patient ID: Wanda [name] female   DOB: [birth year] 58 y.o.   MRN: [id number]  HPI: Wanda [name] is a 58 y.o. woman pmh as listed below presents for ***.     Past Medical History  Diagnosis Date  . Depression   . GERD (gastroesophageal reflux disease)   . Hypertension   . 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 SLE (systemic lupus erythematosus)   . Venous insufficiency (chronic) (peripheral)   . Osteopenia   . ANEMIA-IRON DEFICIENCY 03/02/2006    H&H 8.5/25.9 5/09. On 5/13 12.8/39.7 with MCV 80.4.     Marland Kitchen CEREBROVASCULAR ACCIDENT, HX OF 03/02/2006    Ruptured berry aneurysm, right hemiparesis & expressive aphasia     . SVT (supraventricular tachycardia)     a. 03/2013 with mildly elevated troponin at that time.   Current Outpatient Prescriptions  Medication Sig Dispense Refill  . aspirin 81 MG tablet Take 81 mg by mouth daily.        Marland Kitchen atorvastatin (LIPITOR) 40 MG tablet TAKE 1 TABLET BY MOUTH EVERY DAY  30 tablet  6  . calcium-vitamin D (OSCAL) 250-125 MG-UNIT per tablet Take 1 tablet by mouth daily.        . Cholecalciferol (REPLESTA) 50000 UNITS WAFR Take 50,000 Units by mouth every 7 (seven) days. On wednesdays      . denosumab (PROLIA) 60 MG/ML SOLN injection Inject 60 mg into the skin every 6 (six) months. Administer in upper arm, thigh, or abdomen    0  . gabapentin (NEURONTIN) 300 MG capsule TAKE 1 CAPSULE (300 MG TOTAL) BY MOUTH 3 (THREE) TIMES DAILY.  270 capsule  3  . hydrochlorothiazide (HYDRODIURIL) 12.5 MG tablet TAKE 1 TABLET BY MOUTH DAILY  30 tablet  6  . HYDROcodone-acetaminophen (NORCO/VICODIN) 5-325 MG per tablet Take 1 tablet by mouth every 6 (six) hours as needed for moderate pain.  60 tablet  0  . ipratropium (ATROVENT) 0.02 % nebulizer solution Take 2.5 mLs (0.5 mg total) by nebulization every 4 (four) hours as needed for wheezing or shortness of breath.  75 mL  3  . levETIRAcetam (KEPPRA) 500 MG tablet TAKE ONE TABLET  TWICE DAILY  60 tablet  11  . loratadine (CLARITIN) 10 MG tablet TAKE 1 TABLET (10 MG TOTAL) BY MOUTH DAILY.  30 tablet  6  . metoCLOPramide (REGLAN) 5 MG tablet TAKE ONE TABLET FOUR TIMES DAILY  120 tablet  6  . metoprolol (LOPRESSOR) 50 MG tablet Take 1.5 tablets (75 mg total) by mouth 2 (two) times daily.  90 tablet  11  . Multiple Vitamins-Minerals (ADULT ONE DAILY GUMMIES) CHEW Chew 2 each by mouth daily.      Marland Kitchen omeprazole (PRILOSEC) 20 MG capsule TAKE ONE (1) CAPSULE EACH DAY  31 capsule  11  . potassium chloride (K-DUR) 10 MEQ tablet TAKE 1 TABLET EVERY DAY WHILE TAKING FUROSEMIDE  30 tablet  5  . promethazine (PHENERGAN) 12.5 MG tablet Take 1 tablet (12.5 mg total) by mouth every 8 (eight) hours as needed for nausea.  45 tablet  2  . venlafaxine XR (EFFEXOR-XR) 75 MG 24 hr capsule TAKE ONE (1) CAPSULE EACH DAY  31 capsule  11   No current facility-administered medications for this visit.   Family History  Problem Relation Age of Onset  . Heart attack Brother 35   History   Social History  . Marital Status: Widowed  Spouse Name: N/A    Number of Children: N/A  . Years of Education: N/A   Social History Main Topics  . Smoking status: Former Smoker -- 1.50 packs/day    Types: Cigarettes    Quit date: 05/30/2003  . Smokeless tobacco: Never Used     Comment: she smoked for 30 years before she quit  . Alcohol Use: No  . Drug Use: No  . Sexual Activity: Not on file   Other Topics Concern  . Not on file   Social History Narrative   Has a home nurse,    Lives with her 2 sons,    Former smoker   Widow         Review of Systems: Pertinent items are noted in HPI. Objective:  Physical Exam: There were no vitals filed for this visit. General: sitting in chair, NAD *** HEENT: PERRL, EOMI, no scleral icterus Cardiac: RRR, no rubs, murmurs or gallops Pulm: clear to auscultation bilaterally, moving normal volumes of air Abd: soft, nontender, nondistended, BS  present Ext: warm and well perfused, no pedal edema Neuro: alert and oriented X3, cranial nerves II-XII grossly intact  Assessment & Plan:  Please see problem oriented charting  Pt discussed with Dr. Marland Kitchen***

## 2014-03-22 ENCOUNTER — Encounter: Payer: Medicare Other | Admitting: Internal Medicine

## 2014-03-26 ENCOUNTER — Ambulatory Visit: Payer: Medicare Other | Admitting: Internal Medicine

## 2014-03-28 ENCOUNTER — Ambulatory Visit (INDEPENDENT_AMBULATORY_CARE_PROVIDER_SITE_OTHER): Payer: Medicare Other | Admitting: Internal Medicine

## 2014-03-28 ENCOUNTER — Ambulatory Visit (HOSPITAL_COMMUNITY): Payer: Medicare Other

## 2014-03-28 ENCOUNTER — Ambulatory Visit (HOSPITAL_COMMUNITY)
Admission: RE | Admit: 2014-03-28 | Discharge: 2014-03-28 | Disposition: A | Discharge status: Home / Self Care | Payer: Medicare Other | Source: Ambulatory Visit | Attending: Internal Medicine | Admitting: Internal Medicine

## 2014-03-28 VITALS — BP 122/77 | HR 85 | Temp 97.9°F

## 2014-03-28 DIAGNOSIS — I10 Essential (primary) hypertension: Secondary | ICD-10-CM

## 2014-03-28 DIAGNOSIS — Z0000 Encounter for general adult medical examination without abnormal findings: Secondary | ICD-10-CM | POA: Diagnosis not present

## 2014-03-28 DIAGNOSIS — R300 Dysuria: Secondary | ICD-10-CM

## 2014-03-28 DIAGNOSIS — R0602 Shortness of breath: Secondary | ICD-10-CM | POA: Diagnosis not present

## 2014-03-28 DIAGNOSIS — M25569 Pain in unspecified knee: Secondary | ICD-10-CM | POA: Diagnosis not present

## 2014-03-28 DIAGNOSIS — J4520 Mild intermittent asthma, uncomplicated: Secondary | ICD-10-CM

## 2014-03-28 DIAGNOSIS — R062 Wheezing: Secondary | ICD-10-CM | POA: Insufficient documentation

## 2014-03-28 DIAGNOSIS — R600 Localized edema: Secondary | ICD-10-CM

## 2014-03-28 LAB — CBC WITH DIFFERENTIAL/PLATELET
BASOS PCT: 0 % (ref 0–1)
Basophils Absolute: 0 10*3/uL (ref 0.0–0.1)
Eosinophils Absolute: 0.1 10*3/uL (ref 0.0–0.7)
Eosinophils Relative: 2 % (ref 0–5)
HEMATOCRIT: 42.9 % (ref 36.0–46.0)
HEMOGLOBIN: 13.2 g/dL (ref 12.0–15.0)
Lymphocytes Relative: 35 % (ref 12–46)
Lymphs Abs: 2.3 10*3/uL (ref 0.7–4.0)
MCH: 25.3 pg — AB (ref 26.0–34.0)
MCHC: 30.8 g/dL (ref 30.0–36.0)
MCV: 82.3 fL (ref 78.0–100.0)
MONO ABS: 0.4 10*3/uL (ref 0.1–1.0)
MONOS PCT: 6 % (ref 3–12)
Neutro Abs: 3.8 10*3/uL (ref 1.7–7.7)
Neutrophils Relative %: 57 % (ref 43–77)
Platelets: 492 10*3/uL — ABNORMAL HIGH (ref 150–400)
RBC: 5.21 MIL/uL — ABNORMAL HIGH (ref 3.87–5.11)
RDW: 15.4 % (ref 11.5–15.5)
WBC: 6.7 10*3/uL (ref 4.0–10.5)

## 2014-03-28 LAB — URINALYSIS, ROUTINE W REFLEX MICROSCOPIC
BILIRUBIN URINE: NEGATIVE
GLUCOSE, UA: NEGATIVE mg/dL
Ketones, ur: NEGATIVE mg/dL
Leukocytes, UA: NEGATIVE
Nitrite: NEGATIVE
PROTEIN: NEGATIVE mg/dL
Specific Gravity, Urine: 1.023 (ref 1.005–1.030)
Urobilinogen, UA: 1 mg/dL (ref 0.0–1.0)
pH: 6.5 (ref 5.0–8.0)

## 2014-03-28 LAB — COMPLETE METABOLIC PANEL WITH GFR
ALBUMIN: 3.6 g/dL (ref 3.5–5.2)
ALT: 13 U/L (ref 0–35)
AST: 13 U/L (ref 0–37)
Alkaline Phosphatase: 119 U/L — ABNORMAL HIGH (ref 39–117)
BILIRUBIN TOTAL: 0.4 mg/dL (ref 0.3–1.2)
BUN: 15 mg/dL (ref 6–23)
CO2: 32 meq/L (ref 19–32)
Calcium: 10.4 mg/dL (ref 8.4–10.5)
Chloride: 96 mEq/L (ref 96–112)
Creat: 0.74 mg/dL (ref 0.50–1.10)
GFR, Est African American: >89 mL/min
GFR, Est Non African American: >89 mL/min
Glucose, Bld: 114 mg/dL — ABNORMAL HIGH (ref 70–99)
POTASSIUM: 4.3 meq/L (ref 3.5–5.3)
SODIUM: 141 meq/L (ref 135–145)
Total Protein: 8.6 g/dL — ABNORMAL HIGH (ref 6.0–8.3)

## 2014-03-28 LAB — URINALYSIS, MICROSCOPIC ONLY

## 2014-03-28 MED ORDER — POTASSIUM CHLORIDE ER 10 MEQ PO TBCR: EXTENDED_RELEASE_TABLET | ORAL | Status: DC

## 2014-03-28 MED ORDER — ALBUTEROL SULFATE (2.5 MG/3ML) 0.083% IN NEBU: 2.5000 mg | INHALATION_SOLUTION | Freq: Four times a day (QID) | RESPIRATORY_TRACT | Status: DC | PRN

## 2014-03-28 NOTE — Patient Instructions (Signed)
General Instructions: Follow up in 2 weeks if not feeling better We will check labs today, chest Xray, do an ultrasound of your legs to make sure you dont have a clot  Try Albuterol nebulizer for wheezing Robitussin or Mucinex DM for cough, continue your allergy medications Take care    Please bring your medicines with you each time you come to clinic.  Medicines may include prescription medications, over-the-counter medications, herbal remedies, eye drops, vitamins, or other pills.   Progress Toward Treatment Goals:  Treatment Goal 03/28/2014  Blood pressure at goal    Self Care Goals & Plans:  Self Care Goal 03/28/2014  Manage my medications take my medicines as prescribed; bring my medications to every visit; refill my medications on time; follow the sick day instructions if I am sick  Monitor my health keep track of my blood pressure  Eat healthy foods drink diet soda or water instead of juice or soda; eat more vegetables; eat foods that are low in salt; eat baked foods instead of fried foods; eat fruit for snacks and desserts; eat smaller portions  Be physically active find an activity I enjoy  Meeting treatment goals maintain the current self-care plan    No flowsheet data found.   Care Management & Community Referrals:  Referral 03/28/2014  Referrals made for care management support none needed  Referrals made to community resources none         Treatment Goals:  Goals (1 Years of Data) as of 03/28/14          As of Today 09/05/13 08/31/13 07/26/13 07/24/13     Blood Pressure   . Blood Pressure < 130/80  122/77 145/80 135/85 138/79 134/79      Progress Toward Treatment Goals:  Treatment Goal 03/28/2014  Blood pressure at goal    Self Care Goals & Plans:  Self Care Goal 03/28/2014  Manage my medications take my medicines as prescribed; bring my medications to every visit; refill my medications on time; follow the sick day instructions if I am sick    Monitor my health keep track of my blood pressure  Eat healthy foods drink diet soda or water instead of juice or soda; eat more vegetables; eat foods that are low in salt; eat baked foods instead of fried foods; eat fruit for snacks and desserts; eat smaller portions  Be physically active find an activity I enjoy  Meeting treatment goals maintain the current self-care plan    No flowsheet data found.   Care Management & Community Referrals:  Referral 03/28/2014  Referrals made for care management support none needed  Referrals made to community resources none    Dysuria Dysuria is the medical term for pain with urination. There are many causes for dysuria, but urinary tract infection is the most common. If a urinalysis was performed it can show that there is a urinary tract infection. A urine culture confirms that you or your child is sick. You will need to follow up with a healthcare provider because:  If a urine culture was done you will need to know the culture results and treatment recommendations.  If the urine culture was positive, you or your child will need to be put on antibiotics or know if the antibiotics prescribed are the right antibiotics for your urinary tract infection.  If the urine culture is negative (no urinary tract infection), then other causes may need to be explored or antibiotics need to be stopped. Today laboratory work may  have been done and there does not seem to be an infection. If cultures were done they will take at least 24 to 48 hours to be completed. Today x-rays may have been taken and they read as normal. No cause can be found for the problems. The x-rays may be re-read by a radiologist and you will be contacted if additional findings are made. You or your child may have been put on medications to help with this problem until you can see your primary caregiver. If the problems get better, see your primary caregiver if the problems return. If you were  given antibiotics (medications which kill germs), take all of the mediations as directed for the full course of treatment.  If laboratory work was done, you need to find the results. Leave a telephone number where you can be reached. If this is not possible, make sure you find out how you are to get test results. HOME CARE INSTRUCTIONS   Drink lots of fluids. For adults, drink eight, 8 ounce glasses of clear juice or water a day. For children, replace fluids as suggested by your caregiver.  Empty the bladder often. Avoid holding urine for long periods of time.  After a bowel movement, women should cleanse front to back, using each tissue only once.  Empty your bladder before and after sexual intercourse.  Take all the medicine given to you until it is gone. You may feel better in a few days, but TAKE ALL MEDICINE.  Avoid caffeine, tea, alcohol and carbonated beverages, because they tend to irritate the bladder.  In men, alcohol may irritate the prostate.  Only take over-the-counter or prescription medicines for pain, discomfort, or fever as directed by your caregiver.  If your caregiver has given you a follow-up appointment, it is very important to keep that appointment. Not keeping the appointment could result in a chronic or permanent injury, pain, and disability. If there is any problem keeping the appointment, you must call back to this facility for assistance. SEEK IMMEDIATE MEDICAL CARE IF:   Back pain develops.  A fever develops.  There is nausea (feeling sick to your stomach) or vomiting (throwing up).  Problems are no better with medications or are getting worse. MAKE SURE YOU:   Understand these instructions.  Will watch your condition.  Will get help right away if you are not doing well or get worse. Document Released: 01/31/2004 Document Revised: 07/27/2011 Document Reviewed: 12/08/2007 [facility] Patient Information 2015 [place], [place]. This information is not  intended to replace advice given to you by your health care provider. Make sure you discuss any questions you have with your health care provider.  Cough, Adult  A cough is a reflex that helps clear your throat and airways. It can help heal the body or may be a reaction to an irritated airway. A cough may only last 2 or 3 weeks (acute) or may last more than 8 weeks (chronic).  CAUSES Acute cough:  Viral or bacterial infections. Chronic cough:  Infections.  Allergies.  Asthma.  Post-nasal drip.  Smoking.  Heartburn or acid reflux.  Some medicines.  Chronic lung problems (COPD).  Cancer. SYMPTOMS   Cough.  Fever.  Chest pain.  Increased breathing rate.  High-pitched whistling sound when breathing (wheezing).  Colored mucus that you cough up (sputum). TREATMENT   A bacterial cough may be treated with antibiotic medicine.  A viral cough must run its course and will not respond to antibiotics.  Your caregiver may  recommend other treatments if you have a chronic cough. HOME CARE INSTRUCTIONS   Only take over-the-counter or prescription medicines for pain, discomfort, or fever as directed by your caregiver. Use cough suppressants only as directed by your caregiver.  Use a cold steam vaporizer or humidifier in your bedroom or home to help loosen secretions.  Sleep in a semi-upright position if your cough is worse at night.  Rest as needed.  Stop smoking if you smoke. SEEK IMMEDIATE MEDICAL CARE IF:   You have pus in your sputum.  Your cough starts to worsen.  You cannot control your cough with suppressants and are losing sleep.  You begin coughing up blood.  You have difficulty breathing.  You develop pain which is getting worse or is uncontrolled with medicine.  You have a fever. MAKE SURE YOU:   Understand these instructions.  Will watch your condition.  Will get help right away if you are not doing well or get worse. Document Released:  10/31/2010 Document Revised: 07/27/2011 Document Reviewed: 10/31/2010 [facility] Patient Information 2015 [place], [place]. This information is not intended to replace advice given to you by your health care provider. Make sure you discuss any questions you have with your health care provider.

## 2014-03-29 ENCOUNTER — Ambulatory Visit (HOSPITAL_COMMUNITY)
Admission: RE | Admit: 2014-03-29 | Discharge: 2014-03-29 | Disposition: A | Discharge status: Home / Self Care | Payer: Medicare Other | Source: Ambulatory Visit | Attending: Internal Medicine | Admitting: Internal Medicine

## 2014-03-29 ENCOUNTER — Ambulatory Visit (HOSPITAL_COMMUNITY)
Admission: RE | Admit: 2014-03-29 | Discharge: 2014-03-29 | Disposition: A | Discharge status: Home / Self Care | Payer: Medicare Other | Source: Ambulatory Visit | Attending: Vascular Surgery | Admitting: Vascular Surgery

## 2014-03-29 ENCOUNTER — Other Ambulatory Visit: Payer: Self-pay | Admitting: Internal Medicine

## 2014-03-29 DIAGNOSIS — M25569 Pain in unspecified knee: Secondary | ICD-10-CM | POA: Diagnosis not present

## 2014-03-29 DIAGNOSIS — R059 Cough, unspecified: Secondary | ICD-10-CM

## 2014-03-29 DIAGNOSIS — R05 Cough: Secondary | ICD-10-CM

## 2014-03-29 DIAGNOSIS — R062 Wheezing: Secondary | ICD-10-CM

## 2014-03-29 DIAGNOSIS — M79609 Pain in unspecified limb: Secondary | ICD-10-CM | POA: Diagnosis not present

## 2014-03-29 DIAGNOSIS — R0602 Shortness of breath: Secondary | ICD-10-CM | POA: Diagnosis not present

## 2014-03-29 LAB — URINE CULTURE: Colony Count: >=100000

## 2014-03-29 MED ORDER — IPRATROPIUM BROMIDE 0.02 % IN SOLN: 0.5000 mg | RESPIRATORY_TRACT | Status: DC | PRN

## 2014-03-29 NOTE — Assessment & Plan Note (Addendum)
Currently wheezing likely asthma mildly uncontrolled due to URI (viral i.e rhinovirus), will r/o pneumonia with CXR and check  CBC Will continue Duonebs prn  Also sx'matic tx with Mucinex or Robitussin Pt will come back and get CXR

## 2014-03-29 NOTE — Assessment & Plan Note (Signed)
With h/o UTI concerned pt may have another UTI Will check UA, urine culture dipstick with trace blood and trace leuks If + will tx

## 2014-03-29 NOTE — Assessment & Plan Note (Addendum)
Currently pain in right calf >left but pain in b/l legs. Not much edema today Will r/o DVT with doppler. Pt unable to stay will come back and get [place]

## 2014-03-29 NOTE — Progress Notes (Signed)
*  Preliminary Results* Bilateral lower extremity venous duplex completed. Visualized veins of bilateral lower extremities are negative for deep vein thrombosis. There is no evidence of Baker's cyst bilaterally.  03/29/2014  Gertie FeyMichelle Wilton Thrall, RVT, RDCS, RDMS

## 2014-03-29 NOTE — Progress Notes (Signed)
   Subjective:    Patient ID: Wanda [name], female    DOB: 02/25/1956, 58 y.o.   MRN: [id number]  HPI Comments: 58 y.o significant pmh   She presents for multiple sx's. History given somewhat by patient mostly by caretaker 1. For 1 week pt has been coughing yellow phelegm, runny nose, denies fever, had chills and episodes where she felt cold and sweaty.  She also has been wheezing more and using the Atrovent nebulizer more.  She has had decreased sleep due to sx'. She states her children may have been sick around her. Pt has h/o asthma and caretaker states she does not use inhalers b/c of difficulty of use and it is easier for her to use nebulizers.   2. Insomnia-denies watching TV, caffeine, gatorade 3. Caretaker c/o right foot/leg swelling and pt c/o pain in the area right leg but also left leg. Caretaker states pt is immobile at home  4. Pt also c/o dysuria.   5. HTN-BP controlled today 122/77.  Caretaker mentions pt is taking a 12.5 mg and 25 mg HCTZ pill for blood pressure. Advised only to take 25 mg or (2) 12.5 mg tablets.        Review of Systems  Respiratory:       Sob with exertion   Cardiovascular: Positive for leg swelling. Negative for chest pain.  Gastrointestinal: Positive for abdominal pain. Negative for constipation.  Genitourinary: Positive for dysuria.       Objective:   Physical Exam  Constitutional: She is oriented to person, place, and time. Vital signs are normal. She appears well-developed and well-nourished. She is cooperative. No distress.  HENT:  Head: Normocephalic and atraumatic.  Mouth/Throat: No oropharyngeal exudate.  Eyes: Conjunctivae are normal. Right eye exhibits no discharge. Left eye exhibits no discharge. No scleral icterus.  Cardiovascular: Normal rate, regular rhythm, S1 normal, S2 normal and normal heart sounds.   No murmur heard. Trace leg edema right lower ext  Pulmonary/Chest: Effort normal and breath sounds normal. No respiratory  distress. She has no wheezes.  Abdominal: Soft. Bowel sounds are normal. There is tenderness.  Lower ab mild ttp b/l   Neurological: She is alert and oriented to person, place, and time.  BL in wheelchair and bedbound 2/2 stroke  Skin: Skin is warm, dry and intact. No rash noted. She is not diaphoretic.  Psychiatric: She has a normal mood and affect. Her speech is normal and behavior is normal. Judgment and thought content normal. Cognition and memory are normal.  Nursing note and vitals reviewed.         Assessment & Plan:  F/u in 2 weeks if not feeling better

## 2014-03-29 NOTE — Assessment & Plan Note (Signed)
When pt feels better to return for flu shot

## 2014-03-29 NOTE — Assessment & Plan Note (Signed)
BP Readings from Last 3 Encounters:  03/28/14 122/77  09/05/13 145/80  08/31/13 135/85    Lab Results  Component Value Date   NA 141 03/28/2014   K 4.3 03/28/2014   CREATININE 0.74 03/28/2014    Assessment: Blood pressure control: controlled Progress toward BP goal:  at goal Comments: none  Plan: Medications:  continue current medications-advised to take only HCTZ 25 mg daily, Lopressor 75 mg bid Other plans: check CMET today

## 2014-03-30 ENCOUNTER — Encounter: Payer: Self-pay | Admitting: Internal Medicine

## 2014-03-30 NOTE — Progress Notes (Signed)
Internal Medicine Clinic Attending  Case discussed with Dr. McLean soon after the resident saw the patient.  We reviewed the resident's history and exam and pertinent patient test results.  I agree with the assessment, diagnosis, and plan of care documented in the resident's note. 

## 2014-04-23 ENCOUNTER — Other Ambulatory Visit: Payer: Self-pay | Admitting: Internal Medicine

## 2014-05-16 ENCOUNTER — Other Ambulatory Visit: Payer: Self-pay | Admitting: *Deleted

## 2014-05-16 DIAGNOSIS — J4520 Mild intermittent asthma, uncomplicated: Secondary | ICD-10-CM

## 2014-05-16 DIAGNOSIS — G40909 Epilepsy, unspecified, not intractable, without status epilepticus: Secondary | ICD-10-CM

## 2014-05-16 DIAGNOSIS — I10 Essential (primary) hypertension: Secondary | ICD-10-CM

## 2014-05-16 DIAGNOSIS — I471 Supraventricular tachycardia: Secondary | ICD-10-CM

## 2014-05-16 DIAGNOSIS — R062 Wheezing: Secondary | ICD-10-CM

## 2014-05-16 MED ORDER — GABAPENTIN 300 MG PO CAPS: ORAL_CAPSULE | ORAL | Status: DC

## 2014-05-16 MED ORDER — LEVETIRACETAM 500 MG PO TABS: 500.0000 mg | ORAL_TABLET | Freq: Two times a day (BID) | ORAL | Status: DC

## 2014-05-16 MED ORDER — METOPROLOL TARTRATE 50 MG PO TABS: 75.0000 mg | ORAL_TABLET | Freq: Two times a day (BID) | ORAL | Status: DC

## 2014-05-21 ENCOUNTER — Other Ambulatory Visit: Payer: Self-pay | Admitting: Internal Medicine

## 2014-06-21 ENCOUNTER — Other Ambulatory Visit: Payer: Self-pay | Admitting: *Deleted

## 2014-06-21 MED ORDER — HYDROCHLOROTHIAZIDE 12.5 MG PO TABS: 12.5000 mg | ORAL_TABLET | Freq: Every day | ORAL | Status: DC

## 2014-06-22 ENCOUNTER — Other Ambulatory Visit: Payer: Self-pay | Admitting: *Deleted

## 2014-06-25 ENCOUNTER — Other Ambulatory Visit: Payer: Self-pay | Admitting: *Deleted

## 2014-06-25 MED ORDER — ATORVASTATIN CALCIUM 40 MG PO TABS: 40.0000 mg | ORAL_TABLET | Freq: Every day | ORAL | Status: DC

## 2014-06-25 NOTE — Telephone Encounter (Signed)
Talked with pt's daughter and she states she has been given pt HCTZ 25 mg a day.  She is using 2 of the 12.5 mg tabs so runs out every 2 weeks.  If you want her taking HCTZ 25 mg please refill at that strength. Also she has not been taking KCL.  Do you want her to stay off it?

## 2014-06-26 NOTE — Telephone Encounter (Signed)
She can continue to take the HCTZ 25mg  daily if it is controlling her peripheral edema. How long has she been off the potassium supplement? [facility] but [facility] looks like the rx for KCl was filled in December. Can you call to schedule her an appt this or next week for labs to check her potassium and evaluate her peripheral edema? Thanks!

## 2014-06-29 NOTE — Telephone Encounter (Addendum)
Pt scheduled for OV Will you refill HCTZ?

## 2014-07-03 MED ORDER — HYDROCHLOROTHIAZIDE 12.5 MG PO TABS: 25.0000 mg | ORAL_TABLET | Freq: Every day | ORAL | Status: DC

## 2014-07-04 ENCOUNTER — Telehealth: Payer: Self-pay | Admitting: *Deleted

## 2014-07-04 ENCOUNTER — Telehealth: Payer: Self-pay | Admitting: Internal Medicine

## 2014-07-04 NOTE — Telephone Encounter (Signed)
Caregiver called about pt - both feet swollen past 2 days. Has appt sch with Dr Allena KatzPatel 07/05/14 1:45PM in clinic. Has bought pt back to her home to make sure feet are elevated and restricting salt intake. Stanton KidneyDebra Josehua Hammar RN 2/167/16 11:30AM

## 2014-07-04 NOTE — Telephone Encounter (Signed)
Call to patient to confirm appointment for 07/05/14 at 1:45 home is disconnected and cell mailbox is full unable to leave message

## 2014-07-05 ENCOUNTER — Ambulatory Visit (INDEPENDENT_AMBULATORY_CARE_PROVIDER_SITE_OTHER): Payer: Medicare Other | Admitting: Internal Medicine

## 2014-07-05 ENCOUNTER — Encounter: Payer: Self-pay | Admitting: Internal Medicine

## 2014-07-05 VITALS — BP 161/88 | HR 73 | Temp 97.7°F

## 2014-07-05 DIAGNOSIS — R600 Localized edema: Secondary | ICD-10-CM | POA: Diagnosis present

## 2014-07-05 MED ORDER — HYDROCHLOROTHIAZIDE 12.5 MG PO TABS: 50.0000 mg | ORAL_TABLET | Freq: Every day | ORAL | Status: DC

## 2014-07-05 MED ORDER — POTASSIUM CHLORIDE ER 10 MEQ PO TBCR: 10.0000 meq | EXTENDED_RELEASE_TABLET | Freq: Two times a day (BID) | ORAL | Status: DC

## 2014-07-05 NOTE — Patient Instructions (Signed)
Please take HCTZ twice a day to see if it improves your leg swelling.   Also take K supplements as well while you are on this dose.  If you start feeling dizzy or sick, please call the clinic at [phone].

## 2014-07-05 NOTE — Assessment & Plan Note (Signed)
Overview -Her daughter reports that the patient has had worsening leg edema over the last several days.  -She is also required more her inhaler but thinks it might be related to the cold weather.  -Lower extremity Dopplers done back in November 2015 were unremarkable for any acute DVT.  -Cardiac echo in 2014 was notable for grade 1 diastolic dysfunction.  -She denies any chest pain, abdominal pain, nausea, vomiting, diarrhea.  Assessment -Unsure the etiology of her leg edema aside from grade 1 diastolic dysfunction. -Lab work done back in November 2015 did not show signs of renal failure or hepatic failure which could also account for bilateral leg edema. -Urinalysis done at that time also did not show any signs of nephrotic syndrome given the absence of proteinuria -Definitive assessment and management was limited by the constraints of time as her daughter had to leave to go pick up her son. -Does not appear that she has had any recent medication changes which could explain this acute onset of edema -No signs of respiratory distress or hemodynamic instability are otherwise reassuring  Plan -Increased HCTZ from 25 mg to 50 mg as she cannot tolerate Lasix given left facial swelling noted back in 2014 -Gave them strict precautions to call on-call provider should her mother started having worsening dizziness, weakness, and not feeling well overall -Recommended she continue taking potassium supplement 10 mEq twice a day while she is on this increased diuretic dose

## 2014-07-05 NOTE — Progress Notes (Signed)
   Subjective:    Patient ID: Wanda [name], female    DOB: 10/03/1955, 59 y.o.   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 Please see assessment & plan for documentation of each problem.   Review of Systems  Constitutional: Negative for fever.  Respiratory: Negative for shortness of breath.   Cardiovascular: Positive for leg swelling (bilateral, symmetric). Negative for chest pain.  Gastrointestinal: Negative for nausea, abdominal pain and diarrhea.       Objective:   Physical Exam Constitutional: She is oriented to person, place, and time. Sitting in wheelchair in no distress.  Head: Normocephalic and atraumatic.  Eyes: Conjunctivae are normal.  Cardiovascular: Normal rate, regular rhythm and normal heart sounds.  Exam reveals no gallop and no friction rub.   No murmur heard. Pulmonary/Chest: Effort normal. No respiratory distress. She has no wheezes. She has no rales.  Extremities: 2+ pitting edema noted up to the knees bilaterally. No leg asymmetry was nor was there calf tenderness bilaterally. Heel edema noted as well bilaterally.  Skin: Skin is warm and dry. She is not diaphoretic.  Psychiatric: Her behavior is normal.         Assessment & Plan:

## 2014-07-06 NOTE — Progress Notes (Signed)
Medicine attending: Medical history, presenting problems, physical findings, and medications, reviewed with Dr Rushil Patel on the day of the patient visit  and I concur with his evaluation and management plan. 

## 2014-07-23 ENCOUNTER — Other Ambulatory Visit: Payer: Self-pay | Admitting: *Deleted

## 2014-07-23 MED ORDER — OMEPRAZOLE 20 MG PO CPDR: 20.0000 mg | DELAYED_RELEASE_CAPSULE | Freq: Every day | ORAL | Status: DC

## 2014-09-07 ENCOUNTER — Telehealth: Payer: Self-pay | Admitting: *Deleted

## 2014-09-07 ENCOUNTER — Encounter: Payer: Medicare Other | Admitting: Internal Medicine

## 2014-09-07 NOTE — Telephone Encounter (Signed)
She really needs to be seen sooner rather than later. If we have openings today, pls sch for this afternoon. If not, and pt sxs are bad, [facility] IF she has new weakness, slurred speech, confusion, AMS, CP - she needs to be seen today.  IF she cannot be seen today, pls advise her resume at 25 QD (her old dose).

## 2014-09-07 NOTE — Telephone Encounter (Signed)
Pt has been scheduled for 145 today 

## 2014-09-07 NOTE — Telephone Encounter (Signed)
Pt's aide called to reports Wanda [name] has been lightheaded for past 2 weeks.  She is not sleeping well and they feel this may be the cause. Pt is Wheelchair bound, no ambulation.  She gets lightheaded on and off during the day.  No other changes, eating and drinking the same.   Last visit 2/18.  Her dose of  HCTZ was increased  at that visit  # [phone]  Please advise

## 2014-10-02 ENCOUNTER — Encounter: Payer: Self-pay | Admitting: *Deleted

## 2014-11-14 ENCOUNTER — Telehealth: Payer: Self-pay | Admitting: *Deleted

## 2014-11-14 NOTE — Telephone Encounter (Signed)
Pt CNA called to make an appt since it has been awhile since seen in clinic. Appt made 02/08/15 1:30PM Dr Ladona Ridgelaylor. CNA denies pt having any dizziness at this time. To call clinic if any change. Stanton KidneyDebra Kolin Erdahl RN 11/14/14 10:30AM.

## 2014-11-20 ENCOUNTER — Telehealth: Payer: Self-pay | Admitting: Internal Medicine

## 2014-11-20 ENCOUNTER — Other Ambulatory Visit: Payer: Self-pay | Admitting: *Deleted

## 2014-11-20 MED ORDER — POTASSIUM CHLORIDE ER 10 MEQ PO TBCR: 10.0000 meq | EXTENDED_RELEASE_TABLET | Freq: Two times a day (BID) | ORAL | Status: DC

## 2014-11-20 MED ORDER — METOCLOPRAMIDE HCL 5 MG PO TABS: 5.0000 mg | ORAL_TABLET | Freq: Four times a day (QID) | ORAL | Status: DC | PRN

## 2014-11-20 MED ORDER — OMEPRAZOLE 20 MG PO CPDR: 20.0000 mg | DELAYED_RELEASE_CAPSULE | Freq: Every day | ORAL | Status: DC

## 2014-11-20 MED ORDER — VENLAFAXINE HCL ER 75 MG PO CP24: 75.0000 mg | ORAL_CAPSULE | Freq: Every day | ORAL | Status: DC

## 2014-11-20 NOTE — Telephone Encounter (Signed)
Will give 1 time 3 month supply until she sees new PCP.  She likely needs to be weaned off of reglan.

## 2014-11-20 NOTE — Telephone Encounter (Signed)
Called pharmacy, requested 2 meds

## 2014-11-20 NOTE — Telephone Encounter (Signed)
[facility] 

## 2015-01-09 ENCOUNTER — Other Ambulatory Visit: Payer: Self-pay | Admitting: *Deleted

## 2015-01-10 MED ORDER — ATORVASTATIN CALCIUM 40 MG PO TABS: 40.0000 mg | ORAL_TABLET | Freq: Every day | ORAL | Status: DC

## 2015-02-07 ENCOUNTER — Telehealth: Payer: Self-pay | Admitting: Internal Medicine

## 2015-02-07 NOTE — Telephone Encounter (Signed)
Called to pt to confirm appt for 02/08/15 at 1:30 mailbox full

## 2015-02-08 ENCOUNTER — Ambulatory Visit (INDEPENDENT_AMBULATORY_CARE_PROVIDER_SITE_OTHER): Payer: Medicare Other | Admitting: Internal Medicine

## 2015-02-08 ENCOUNTER — Encounter: Payer: Self-pay | Admitting: Internal Medicine

## 2015-02-08 VITALS — BP 160/80 | HR 79 | Temp 98.2°F | Ht 70.0 in | Wt 204.0 lb

## 2015-02-08 DIAGNOSIS — Z23 Encounter for immunization: Secondary | ICD-10-CM | POA: Diagnosis not present

## 2015-02-08 DIAGNOSIS — Z7982 Long term (current) use of aspirin: Secondary | ICD-10-CM | POA: Diagnosis not present

## 2015-02-08 DIAGNOSIS — G8191 Hemiplegia, unspecified affecting right dominant side: Secondary | ICD-10-CM

## 2015-02-08 DIAGNOSIS — I69320 Aphasia following cerebral infarction: Secondary | ICD-10-CM

## 2015-02-08 DIAGNOSIS — I69351 Hemiplegia and hemiparesis following cerebral infarction affecting right dominant side: Secondary | ICD-10-CM | POA: Diagnosis not present

## 2015-02-08 DIAGNOSIS — I639 Cerebral infarction, unspecified: Secondary | ICD-10-CM

## 2015-02-08 DIAGNOSIS — D509 Iron deficiency anemia, unspecified: Secondary | ICD-10-CM | POA: Diagnosis not present

## 2015-02-08 DIAGNOSIS — Z87891 Personal history of nicotine dependence: Secondary | ICD-10-CM

## 2015-02-08 DIAGNOSIS — R32 Unspecified urinary incontinence: Secondary | ICD-10-CM

## 2015-02-08 DIAGNOSIS — R600 Localized edema: Secondary | ICD-10-CM | POA: Diagnosis not present

## 2015-02-08 DIAGNOSIS — I10 Essential (primary) hypertension: Secondary | ICD-10-CM

## 2015-02-08 LAB — POCT GLYCOSYLATED HEMOGLOBIN (HGB A1C): Hemoglobin A1C: 6.9

## 2015-02-08 LAB — GLUCOSE, CAPILLARY: Glucose-Capillary: 88 mg/dL (ref 65–99)

## 2015-02-08 MED ORDER — VENLAFAXINE HCL ER 75 MG PO CP24: 75.0000 mg | ORAL_CAPSULE | Freq: Every day | ORAL | Status: DC

## 2015-02-08 MED ORDER — HYDROCHLOROTHIAZIDE 12.5 MG PO TABS: 25.0000 mg | ORAL_TABLET | Freq: Every day | ORAL | Status: DC

## 2015-02-08 MED ORDER — POTASSIUM CHLORIDE ER 10 MEQ PO TBCR: 10.0000 meq | EXTENDED_RELEASE_TABLET | Freq: Two times a day (BID) | ORAL | Status: DC

## 2015-02-08 NOTE — Assessment & Plan Note (Signed)
BP Readings from Last 3 Encounters:  02/08/15 160/80  07/05/14 161/88  03/28/14 122/77    Lab Results  Component Value Date   NA 141 03/28/2014   K 4.3 03/28/2014   CREATININE 0.74 03/28/2014    Assessment: Blood pressure control:  uncontrolled Progress toward BP goal:    Comments: Patient's family states she only takes HCTZ 12.5mg  daily, though previous clinic reports increasing to  daily.  Patient also taking Metoprolol 75 mg daily.    Plan: Medications:  Continue Metop 75 mg daily.  Increase HCTZ to 25 mg daily Educational resources provided:  AVS summary Self management tools provided:   Other plans: recheck in 1 month

## 2015-02-08 NOTE — Assessment & Plan Note (Signed)
Patient complains of continued bilateral LE edema, right worse than left.  Current swelling has been present for 2 days, with swelling isolated to feet and ankles.  No signs or symptoms of calf swelling.  LE dopplers in February negative for DVT.  Patient has only been taking HCTZ 12.5 mg daily, and has documented reaction to Lasix.  A/P: Chronic LE edema with chronic venous stasis changes.  Pain likely related to dermatosclerosis 2/2 chronic venous stasis.  Without being able to give patient Lasix, will increase HCTZ to 25 mg daily.  Do not see signs of acute infection or DVT. - HCTZ 25 mg daily - Home health for wound care

## 2015-02-08 NOTE — Patient Instructions (Signed)
1. Elevate feet above level of heart. 2. Take HCTZ 25 mg daily 3. Take Metoprolol 75 mg twice daily 4. Clean and dry abdominal skin folds with moist cloth and baby powder.  Return to clinic in 1 month for recheck BP  Hypertension Hypertension, commonly called high blood pressure, is when the force of blood pumping through your arteries is too strong. Your arteries are the blood vessels that carry blood from your heart throughout your body. A blood pressure reading consists of a higher number over a lower number, such as 110/72. The higher number (systolic) is the pressure inside your arteries when your heart pumps. The lower number (diastolic) is the pressure inside your arteries when your heart relaxes. Ideally you want your blood pressure below 120/80. Hypertension forces your heart to work harder to pump blood. Your arteries may become narrow or stiff. Having hypertension puts you at risk for heart disease, stroke, and other problems.  RISK FACTORS Some risk factors for high blood pressure are controllable. Others are not.  Risk factors you cannot control include:   Race. You may be at higher risk if you are African American.  Age. Risk increases with age.  Gender. Men are at higher risk than women before age 7 years. After age 25, women are at higher risk than men. Risk factors you can control include:  Not getting enough exercise or physical activity.  Being overweight.  Getting too much fat, sugar, calories, or salt in your diet.  Drinking too much alcohol. SIGNS AND SYMPTOMS Hypertension does not usually cause signs or symptoms. Extremely high blood pressure (hypertensive crisis) may cause headache, anxiety, shortness of breath, and nosebleed. DIAGNOSIS  To check if you have hypertension, your health care provider will measure your blood pressure while you are seated, with your arm held at the level of your heart. It should be measured at least twice using the same arm. Certain  conditions can cause a difference in blood pressure between your right and left arms. A blood pressure reading that is higher than normal on one occasion does not mean that you need treatment. If one blood pressure reading is high, ask your health care provider about having it checked again. TREATMENT  Treating high blood pressure includes making lifestyle changes and possibly taking medicine. Living a healthy lifestyle can help lower high blood pressure. You may need to change some of your habits. Lifestyle changes may include:  Following the DASH diet. This diet is high in fruits, vegetables, and whole grains. It is low in salt, red meat, and added sugars.  Getting at least 2 hours of brisk physical activity every week.  Losing weight if necessary.  Not smoking.  Limiting alcoholic beverages.  Learning ways to reduce stress. If lifestyle changes are not enough to get your blood pressure under control, your health care provider may prescribe medicine. You may need to take more than one. Work closely with your health care provider to understand the risks and benefits. HOME CARE INSTRUCTIONS  Have your blood pressure rechecked as directed by your health care provider.   Take medicines only as directed by your health care provider. Follow the directions carefully. Blood pressure medicines must be taken as prescribed. The medicine does not work as well when you skip doses. Skipping doses also puts you at risk for problems.   Do not smoke.   Monitor your blood pressure at home as directed by your health care provider. SEEK MEDICAL CARE IF:   You  think you are having a reaction to medicines taken.  You have recurrent headaches or feel dizzy.  You have swelling in your ankles.  You have trouble with your vision. SEEK IMMEDIATE MEDICAL CARE IF:  You develop a severe headache or confusion.  You have unusual weakness, numbness, or feel faint.  You have severe chest or abdominal  pain.  You vomit repeatedly.  You have trouble breathing. MAKE SURE YOU:   Understand these instructions.  Will watch your condition.  Will get help right away if you are not doing well or get worse. Document Released: 05/04/2005 Document Revised: 09/18/2013 Document Reviewed: 02/24/2013 [facility] Patient Information 2015 [place], [place]. This information is not intended to replace advice given to you by your health care provider. Make sure you discuss any questions you have with your health care provider.

## 2015-02-08 NOTE — Progress Notes (Signed)
Patient ID: Wanda [name], female   DOB: 1955-06-14, 59 y.o.   MRN: [id number]    Subjective:   Patient ID: Wanda [name] female   DOB: [birth year] 59 y.o.   MRN: [id number]  HPI: [name] is a 59 y.o. female with PMH as below, here for routine follow-up.  Please see Problem-Based charting for the status of the patient's chronic medical issues.    Past Medical History  Diagnosis Date  . Depression   . GERD (gastroesophageal reflux disease)   . Hypertension   . 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 SLE (systemic lupus erythematosus)   . Venous insufficiency (chronic) (peripheral)   . Osteopenia   . ANEMIA-IRON DEFICIENCY 03/02/2006    H&H 8.5/25.9 5/09. On 5/13 12.8/39.7 with MCV 80.4.     Marland Kitchen CEREBROVASCULAR ACCIDENT, HX OF 03/02/2006    Ruptured berry aneurysm, right hemiparesis & expressive aphasia     . SVT (supraventricular tachycardia)     a. 03/2013 with mildly elevated troponin at that time.   Current Outpatient Prescriptions  Medication Sig Dispense Refill  . albuterol (PROVENTIL) (2.5 MG/3ML) 0.083% nebulizer solution Take 3 mLs (2.5 mg total) by nebulization every 6 (six) hours as needed for wheezing or shortness of breath. 75 mL 12  . aspirin 81 MG tablet Take 81 mg by mouth daily.      Marland Kitchen atorvastatin (LIPITOR) 40 MG tablet Take 1 tablet (40 mg total) by mouth daily. 30 tablet 6  . calcium-vitamin D (OSCAL) 250-125 MG-UNIT per tablet Take 1 tablet by mouth daily.      . Cholecalciferol (REPLESTA) 50000 UNITS WAFR Take 50,000 Units by mouth every 7 (seven) days. On wednesdays    . denosumab (PROLIA) 60 MG/ML SOLN injection Inject 60 mg into the skin every 6 (six) months. Administer in upper arm, thigh, or abdomen  0  . gabapentin (NEURONTIN) 300 MG capsule TAKE 1 CAPSULE (300 MG TOTAL) BY MOUTH 3 (THREE) TIMES DAILY. 270 capsule 3  . hydrochlorothiazide (HYDRODIURIL) 12.5 MG tablet Take 4 tablets (50 mg total) by mouth daily. 30 tablet 6  .  HYDROcodone-acetaminophen (NORCO/VICODIN) 5-325 MG per tablet Take 1 tablet by mouth every 6 (six) hours as needed for moderate pain. 60 tablet 0  . ipratropium (ATROVENT) 0.02 % nebulizer solution Take 2.5 mLs (0.5 mg total) by nebulization every 4 (four) hours as needed for wheezing or shortness of breath. 62.5 mL 2  . levETIRAcetam (KEPPRA) 500 MG tablet Take 1 tablet (500 mg total) by mouth 2 (two) times daily. 60 tablet 11  . loratadine (CLARITIN) 10 MG tablet TAKE 1 TABLET (10 MG TOTAL) BY MOUTH DAILY. 30 tablet 6  . metoCLOPramide (REGLAN) 5 MG tablet Take 1 tablet (5 mg total) by mouth every 6 (six) hours as needed for nausea. 360 tablet 0  . metoprolol (LOPRESSOR) 50 MG tablet Take 1.5 tablets (75 mg total) by mouth 2 (two) times daily. 90 tablet 11  . Multiple Vitamins-Minerals (ADULT ONE DAILY GUMMIES) CHEW Chew 2 each by mouth daily.    Marland Kitchen omeprazole (PRILOSEC) 20 MG capsule Take 1 capsule (20 mg total) by mouth daily. 90 capsule 0  . potassium chloride (K-DUR) 10 MEQ tablet Take 1 tablet (10 mEq total) by mouth 2 (two) times daily. 120 tablet 0  . promethazine (PHENERGAN) 12.5 MG tablet Take 1 tablet (12.5 mg total) by mouth every 8 (eight) hours as needed for nausea. 45 tablet 2  .  venlafaxine XR (EFFEXOR-XR) 75 MG 24 hr capsule Take 1 capsule (75 mg total) by mouth daily with breakfast. 90 capsule 0   No current facility-administered medications for this visit.   Family History  Problem Relation Age of Onset  . Heart attack Brother 6   Social History   Social History  . Marital Status: Widowed    Spouse Name: N/A  . Number of Children: N/A  . Years of Education: N/A   Social History Main Topics  . Smoking status: Former Smoker -- 1.50 packs/day    Types: Cigarettes    Quit date: 05/30/2003  . Smokeless tobacco: Never Used     Comment: she smoked for 30 years before she quit  . Alcohol Use: No  . Drug Use: No  . Sexual Activity: Not on file   Other Topics Concern  .  Not on file   Social History Narrative   Has a home nurse,    Lives with her 2 sons,    Former smoker   Widow         Review of Systems: Review of Systems  Constitutional: Negative for fever, chills, malaise/fatigue and diaphoresis.  HENT: Negative for congestion and sore throat.   Eyes: Positive for blurred vision.  Respiratory: Positive for cough. Negative for wheezing.   Cardiovascular: Positive for leg swelling. Negative for chest pain, palpitations and orthopnea.  Gastrointestinal: Positive for abdominal pain. Negative for nausea, vomiting, diarrhea and constipation.  Genitourinary: Negative for dysuria, urgency and frequency.  Musculoskeletal: Positive for back pain.  Skin: Negative for rash.  Neurological: Negative for dizziness and tingling.  Psychiatric/Behavioral: Negative for depression.     Objective:  Physical Exam: There were no vitals filed for this visit. Physical Exam  Constitutional: She appears well-developed and well-nourished. No distress.  HENT:  Head: Normocephalic and atraumatic.  Eyes: EOM are normal.  Neck: Normal range of motion. No JVD present. No tracheal deviation present.  Cardiovascular: Normal rate, regular rhythm and normal heart sounds.   Distal pulses could not be palpated 2/2 edema.  Pulmonary/Chest: Breath sounds normal. No respiratory distress. She has no wheezes.  Abdominal: Soft. She exhibits no distension. There is no tenderness. There is no rebound and no guarding.  Musculoskeletal:  Right arm contracture.  Bilateral lower extremities with 1+ edema to midshin.    Neurological: She is alert.  Responsive to questions, though aphasic.  Skin: Skin is warm. She is not diaphoretic.  Abdominal pannus with slight maceration and erythema. No papules, erosions, or satellite lesions.     Assessment & Plan:   Patient and case were discussed with Dr. Rogelia Boga.  Please refer to Problem Based charting for further documentation.

## 2015-02-08 NOTE — Assessment & Plan Note (Signed)
Patient has continued right hemiparesis and aphasia.  Complaining of worsening right arm contracture.  She had been doing well with PT in the past, but has been kicked out of multiple groups because she was being mean to them.  Patient expresses willingness to undergo PT again.    Due to her incontinence, she is also wearing Depends diapers.  This is increasing the sweat and occlusion under her abdominal pannus, causing maceration and pain.  She has not noticed any red lesions.  She has been applying some cream, though family does not know what it is.  Plan:  - Home health for PT, OT, speech - Clean and dry abdominal pannus.  No need for antifungal powder or ointment at this time.

## 2015-02-09 LAB — BMP8+ANION GAP
Anion Gap: 17 mmol/L (ref 10.0–18.0)
BUN / CREAT RATIO: 16 (ref 9–23)
BUN: 11 mg/dL (ref 6–24)
CO2: 27 mmol/L (ref 18–29)
CREATININE: 0.67 mg/dL (ref 0.57–1.00)
Calcium: 9.9 mg/dL (ref 8.7–10.2)
Chloride: 97 mmol/L (ref 97–108)
GFR calc Af Amer: 114 mL/min/{1.73_m2} (ref >59)
GFR, EST NON AFRICAN AMERICAN: 99 mL/min/{1.73_m2} (ref >59)
Glucose: 94 mg/dL (ref 65–99)
Potassium: 4.7 mmol/L (ref 3.5–5.2)
Sodium: 141 mmol/L (ref 134–144)

## 2015-02-09 LAB — LIPID PANEL
CHOL/HDL RATIO: 3.3 ratio (ref 0.0–4.4)
CHOLESTEROL TOTAL: 164 mg/dL (ref 100–199)
HDL: 49 mg/dL (ref >39)
LDL CALC: 96 mg/dL (ref 0–99)
Triglycerides: 95 mg/dL (ref 0–149)
VLDL CHOLESTEROL CAL: 19 mg/dL (ref 5–40)

## 2015-02-09 LAB — CBC
Hematocrit: 41.6 % (ref 34.0–46.6)
Hemoglobin: 12.9 g/dL (ref 11.1–15.9)
MCH: 25.2 pg — ABNORMAL LOW (ref 26.6–33.0)
MCHC: 31 g/dL — AB (ref 31.5–35.7)
MCV: 81 fL (ref 79–97)
PLATELETS: 420 10*3/uL — AB (ref 150–379)
RBC: 5.12 x10E6/uL (ref 3.77–5.28)
RDW: 14.9 % (ref 12.3–15.4)
WBC: 5.5 10*3/uL (ref 3.4–10.8)

## 2015-02-09 NOTE — Progress Notes (Signed)
Internal Medicine Clinic Attending  I saw and evaluated the patient.  I personally confirmed the key portions of the history and exam documented by Dr. Taylor and I reviewed pertinent patient test results.  The assessment, diagnosis, and plan were formulated together and I agree with the documentation in the resident's note.  

## 2015-02-11 ENCOUNTER — Telehealth: Payer: Self-pay | Admitting: Licensed Clinical Social Worker

## 2015-02-11 NOTE — Telephone Encounter (Signed)
CSW placed call to Wanda [name] to obtain agency of choice for [facility] services.  Daughter states, pt is unable to speak and she handles pt's affairs.  Daughter states referral for [facility] should go to [facility].  CSW will send referral to [facility].  CSW confirmed that pt does have PCS through [facility].  Referral for [facility] completed.

## 2015-02-13 ENCOUNTER — Telehealth: Payer: Self-pay | Admitting: Internal Medicine

## 2015-02-13 NOTE — Telephone Encounter (Signed)
Return call to Diane with [facility] - clinic aware she will see pt 02/14/16. Stanton Kidney Ditzler RN 02/13/15 10:10AM

## 2015-02-13 NOTE — Telephone Encounter (Signed)
Diane from [facility] called to informed the nurse that she will go out to see pt 02/14/2015.

## 2015-02-14 DIAGNOSIS — K219 Gastro-esophageal reflux disease without esophagitis: Secondary | ICD-10-CM | POA: Diagnosis not present

## 2015-02-14 DIAGNOSIS — D649 Anemia, unspecified: Secondary | ICD-10-CM | POA: Diagnosis not present

## 2015-02-14 DIAGNOSIS — I872 Venous insufficiency (chronic) (peripheral): Secondary | ICD-10-CM | POA: Diagnosis not present

## 2015-02-14 DIAGNOSIS — F329 Major depressive disorder, single episode, unspecified: Secondary | ICD-10-CM | POA: Diagnosis not present

## 2015-02-14 DIAGNOSIS — I69351 Hemiplegia and hemiparesis following cerebral infarction affecting right dominant side: Secondary | ICD-10-CM | POA: Diagnosis not present

## 2015-02-14 DIAGNOSIS — I10 Essential (primary) hypertension: Secondary | ICD-10-CM | POA: Diagnosis not present

## 2015-02-14 DIAGNOSIS — Z87891 Personal history of nicotine dependence: Secondary | ICD-10-CM | POA: Diagnosis not present

## 2015-02-14 DIAGNOSIS — M329 Systemic lupus erythematosus, unspecified: Secondary | ICD-10-CM | POA: Diagnosis not present

## 2015-02-14 DIAGNOSIS — I69320 Aphasia following cerebral infarction: Secondary | ICD-10-CM | POA: Diagnosis not present

## 2015-02-15 DIAGNOSIS — M329 Systemic lupus erythematosus, unspecified: Secondary | ICD-10-CM | POA: Diagnosis not present

## 2015-02-15 DIAGNOSIS — D649 Anemia, unspecified: Secondary | ICD-10-CM | POA: Diagnosis not present

## 2015-02-15 DIAGNOSIS — I10 Essential (primary) hypertension: Secondary | ICD-10-CM | POA: Diagnosis not present

## 2015-02-15 DIAGNOSIS — F329 Major depressive disorder, single episode, unspecified: Secondary | ICD-10-CM | POA: Diagnosis not present

## 2015-02-15 DIAGNOSIS — I69320 Aphasia following cerebral infarction: Secondary | ICD-10-CM | POA: Diagnosis not present

## 2015-02-15 DIAGNOSIS — I69351 Hemiplegia and hemiparesis following cerebral infarction affecting right dominant side: Secondary | ICD-10-CM | POA: Diagnosis not present

## 2015-02-18 DIAGNOSIS — F329 Major depressive disorder, single episode, unspecified: Secondary | ICD-10-CM | POA: Diagnosis not present

## 2015-02-18 DIAGNOSIS — M329 Systemic lupus erythematosus, unspecified: Secondary | ICD-10-CM | POA: Diagnosis not present

## 2015-02-18 DIAGNOSIS — D649 Anemia, unspecified: Secondary | ICD-10-CM | POA: Diagnosis not present

## 2015-02-18 DIAGNOSIS — I69320 Aphasia following cerebral infarction: Secondary | ICD-10-CM | POA: Diagnosis not present

## 2015-02-18 DIAGNOSIS — I69351 Hemiplegia and hemiparesis following cerebral infarction affecting right dominant side: Secondary | ICD-10-CM | POA: Diagnosis not present

## 2015-02-18 DIAGNOSIS — I10 Essential (primary) hypertension: Secondary | ICD-10-CM | POA: Diagnosis not present

## 2015-02-19 DIAGNOSIS — I69351 Hemiplegia and hemiparesis following cerebral infarction affecting right dominant side: Secondary | ICD-10-CM | POA: Diagnosis not present

## 2015-02-19 DIAGNOSIS — D649 Anemia, unspecified: Secondary | ICD-10-CM | POA: Diagnosis not present

## 2015-02-19 DIAGNOSIS — I69320 Aphasia following cerebral infarction: Secondary | ICD-10-CM | POA: Diagnosis not present

## 2015-02-19 DIAGNOSIS — M329 Systemic lupus erythematosus, unspecified: Secondary | ICD-10-CM | POA: Diagnosis not present

## 2015-02-19 DIAGNOSIS — F329 Major depressive disorder, single episode, unspecified: Secondary | ICD-10-CM | POA: Diagnosis not present

## 2015-02-19 DIAGNOSIS — I10 Essential (primary) hypertension: Secondary | ICD-10-CM | POA: Diagnosis not present

## 2015-02-20 DIAGNOSIS — M329 Systemic lupus erythematosus, unspecified: Secondary | ICD-10-CM | POA: Diagnosis not present

## 2015-02-20 DIAGNOSIS — I69351 Hemiplegia and hemiparesis following cerebral infarction affecting right dominant side: Secondary | ICD-10-CM | POA: Diagnosis not present

## 2015-02-20 DIAGNOSIS — I69320 Aphasia following cerebral infarction: Secondary | ICD-10-CM | POA: Diagnosis not present

## 2015-02-20 DIAGNOSIS — D649 Anemia, unspecified: Secondary | ICD-10-CM | POA: Diagnosis not present

## 2015-02-20 DIAGNOSIS — I10 Essential (primary) hypertension: Secondary | ICD-10-CM | POA: Diagnosis not present

## 2015-02-20 DIAGNOSIS — F329 Major depressive disorder, single episode, unspecified: Secondary | ICD-10-CM | POA: Diagnosis not present

## 2015-02-21 DIAGNOSIS — I10 Essential (primary) hypertension: Secondary | ICD-10-CM | POA: Diagnosis not present

## 2015-02-21 DIAGNOSIS — F329 Major depressive disorder, single episode, unspecified: Secondary | ICD-10-CM | POA: Diagnosis not present

## 2015-02-21 DIAGNOSIS — D649 Anemia, unspecified: Secondary | ICD-10-CM | POA: Diagnosis not present

## 2015-02-21 DIAGNOSIS — I69351 Hemiplegia and hemiparesis following cerebral infarction affecting right dominant side: Secondary | ICD-10-CM | POA: Diagnosis not present

## 2015-02-21 DIAGNOSIS — I69320 Aphasia following cerebral infarction: Secondary | ICD-10-CM | POA: Diagnosis not present

## 2015-02-21 DIAGNOSIS — M329 Systemic lupus erythematosus, unspecified: Secondary | ICD-10-CM | POA: Diagnosis not present

## 2015-02-25 DIAGNOSIS — M329 Systemic lupus erythematosus, unspecified: Secondary | ICD-10-CM | POA: Diagnosis not present

## 2015-02-25 DIAGNOSIS — F329 Major depressive disorder, single episode, unspecified: Secondary | ICD-10-CM | POA: Diagnosis not present

## 2015-02-25 DIAGNOSIS — I69351 Hemiplegia and hemiparesis following cerebral infarction affecting right dominant side: Secondary | ICD-10-CM | POA: Diagnosis not present

## 2015-02-25 DIAGNOSIS — D649 Anemia, unspecified: Secondary | ICD-10-CM | POA: Diagnosis not present

## 2015-02-25 DIAGNOSIS — I69320 Aphasia following cerebral infarction: Secondary | ICD-10-CM | POA: Diagnosis not present

## 2015-02-25 DIAGNOSIS — I10 Essential (primary) hypertension: Secondary | ICD-10-CM | POA: Diagnosis not present

## 2015-02-26 DIAGNOSIS — I69351 Hemiplegia and hemiparesis following cerebral infarction affecting right dominant side: Secondary | ICD-10-CM | POA: Diagnosis not present

## 2015-02-26 DIAGNOSIS — M329 Systemic lupus erythematosus, unspecified: Secondary | ICD-10-CM | POA: Diagnosis not present

## 2015-02-26 DIAGNOSIS — I69320 Aphasia following cerebral infarction: Secondary | ICD-10-CM | POA: Diagnosis not present

## 2015-02-26 DIAGNOSIS — F329 Major depressive disorder, single episode, unspecified: Secondary | ICD-10-CM | POA: Diagnosis not present

## 2015-02-26 DIAGNOSIS — D649 Anemia, unspecified: Secondary | ICD-10-CM | POA: Diagnosis not present

## 2015-02-26 DIAGNOSIS — I10 Essential (primary) hypertension: Secondary | ICD-10-CM | POA: Diagnosis not present

## 2015-02-27 DIAGNOSIS — I69351 Hemiplegia and hemiparesis following cerebral infarction affecting right dominant side: Secondary | ICD-10-CM | POA: Diagnosis not present

## 2015-02-27 DIAGNOSIS — D649 Anemia, unspecified: Secondary | ICD-10-CM | POA: Diagnosis not present

## 2015-02-27 DIAGNOSIS — F329 Major depressive disorder, single episode, unspecified: Secondary | ICD-10-CM | POA: Diagnosis not present

## 2015-02-27 DIAGNOSIS — I10 Essential (primary) hypertension: Secondary | ICD-10-CM | POA: Diagnosis not present

## 2015-02-27 DIAGNOSIS — I69320 Aphasia following cerebral infarction: Secondary | ICD-10-CM | POA: Diagnosis not present

## 2015-02-27 DIAGNOSIS — M329 Systemic lupus erythematosus, unspecified: Secondary | ICD-10-CM | POA: Diagnosis not present

## 2015-02-28 DIAGNOSIS — M329 Systemic lupus erythematosus, unspecified: Secondary | ICD-10-CM | POA: Diagnosis not present

## 2015-02-28 DIAGNOSIS — F329 Major depressive disorder, single episode, unspecified: Secondary | ICD-10-CM | POA: Diagnosis not present

## 2015-02-28 DIAGNOSIS — I10 Essential (primary) hypertension: Secondary | ICD-10-CM | POA: Diagnosis not present

## 2015-02-28 DIAGNOSIS — D649 Anemia, unspecified: Secondary | ICD-10-CM | POA: Diagnosis not present

## 2015-02-28 DIAGNOSIS — I69320 Aphasia following cerebral infarction: Secondary | ICD-10-CM | POA: Diagnosis not present

## 2015-02-28 DIAGNOSIS — I69351 Hemiplegia and hemiparesis following cerebral infarction affecting right dominant side: Secondary | ICD-10-CM | POA: Diagnosis not present

## 2015-03-05 DIAGNOSIS — I10 Essential (primary) hypertension: Secondary | ICD-10-CM | POA: Diagnosis not present

## 2015-03-05 DIAGNOSIS — D649 Anemia, unspecified: Secondary | ICD-10-CM | POA: Diagnosis not present

## 2015-03-05 DIAGNOSIS — I69320 Aphasia following cerebral infarction: Secondary | ICD-10-CM | POA: Diagnosis not present

## 2015-03-05 DIAGNOSIS — M329 Systemic lupus erythematosus, unspecified: Secondary | ICD-10-CM | POA: Diagnosis not present

## 2015-03-05 DIAGNOSIS — I69351 Hemiplegia and hemiparesis following cerebral infarction affecting right dominant side: Secondary | ICD-10-CM | POA: Diagnosis not present

## 2015-03-05 DIAGNOSIS — F329 Major depressive disorder, single episode, unspecified: Secondary | ICD-10-CM | POA: Diagnosis not present

## 2015-03-07 DIAGNOSIS — D649 Anemia, unspecified: Secondary | ICD-10-CM | POA: Diagnosis not present

## 2015-03-07 DIAGNOSIS — F329 Major depressive disorder, single episode, unspecified: Secondary | ICD-10-CM | POA: Diagnosis not present

## 2015-03-07 DIAGNOSIS — M329 Systemic lupus erythematosus, unspecified: Secondary | ICD-10-CM | POA: Diagnosis not present

## 2015-03-07 DIAGNOSIS — I69351 Hemiplegia and hemiparesis following cerebral infarction affecting right dominant side: Secondary | ICD-10-CM | POA: Diagnosis not present

## 2015-03-07 DIAGNOSIS — I69320 Aphasia following cerebral infarction: Secondary | ICD-10-CM | POA: Diagnosis not present

## 2015-03-07 DIAGNOSIS — I10 Essential (primary) hypertension: Secondary | ICD-10-CM | POA: Diagnosis not present

## 2015-03-12 DIAGNOSIS — D649 Anemia, unspecified: Secondary | ICD-10-CM | POA: Diagnosis not present

## 2015-03-12 DIAGNOSIS — F329 Major depressive disorder, single episode, unspecified: Secondary | ICD-10-CM | POA: Diagnosis not present

## 2015-03-12 DIAGNOSIS — I69320 Aphasia following cerebral infarction: Secondary | ICD-10-CM | POA: Diagnosis not present

## 2015-03-12 DIAGNOSIS — I10 Essential (primary) hypertension: Secondary | ICD-10-CM | POA: Diagnosis not present

## 2015-03-12 DIAGNOSIS — M329 Systemic lupus erythematosus, unspecified: Secondary | ICD-10-CM | POA: Diagnosis not present

## 2015-03-12 DIAGNOSIS — I69351 Hemiplegia and hemiparesis following cerebral infarction affecting right dominant side: Secondary | ICD-10-CM | POA: Diagnosis not present

## 2015-03-13 DIAGNOSIS — I69351 Hemiplegia and hemiparesis following cerebral infarction affecting right dominant side: Secondary | ICD-10-CM | POA: Diagnosis not present

## 2015-03-13 DIAGNOSIS — M329 Systemic lupus erythematosus, unspecified: Secondary | ICD-10-CM | POA: Diagnosis not present

## 2015-03-13 DIAGNOSIS — D649 Anemia, unspecified: Secondary | ICD-10-CM | POA: Diagnosis not present

## 2015-03-13 DIAGNOSIS — I69320 Aphasia following cerebral infarction: Secondary | ICD-10-CM | POA: Diagnosis not present

## 2015-03-13 DIAGNOSIS — F329 Major depressive disorder, single episode, unspecified: Secondary | ICD-10-CM | POA: Diagnosis not present

## 2015-03-13 DIAGNOSIS — I10 Essential (primary) hypertension: Secondary | ICD-10-CM | POA: Diagnosis not present

## 2015-03-15 ENCOUNTER — Ambulatory Visit: Payer: Medicare Other | Admitting: Internal Medicine

## 2015-03-18 DIAGNOSIS — D649 Anemia, unspecified: Secondary | ICD-10-CM | POA: Diagnosis not present

## 2015-03-18 DIAGNOSIS — I69320 Aphasia following cerebral infarction: Secondary | ICD-10-CM | POA: Diagnosis not present

## 2015-03-18 DIAGNOSIS — F329 Major depressive disorder, single episode, unspecified: Secondary | ICD-10-CM | POA: Diagnosis not present

## 2015-03-18 DIAGNOSIS — M329 Systemic lupus erythematosus, unspecified: Secondary | ICD-10-CM | POA: Diagnosis not present

## 2015-03-18 DIAGNOSIS — I10 Essential (primary) hypertension: Secondary | ICD-10-CM | POA: Diagnosis not present

## 2015-03-18 DIAGNOSIS — I69351 Hemiplegia and hemiparesis following cerebral infarction affecting right dominant side: Secondary | ICD-10-CM | POA: Diagnosis not present

## 2015-03-27 ENCOUNTER — Telehealth: Payer: Self-pay | Admitting: Internal Medicine

## 2015-03-27 ENCOUNTER — Emergency Department (EMERGENCY_DEPARTMENT_HOSPITAL)
Admit: 2015-03-27 | Discharge: 2015-03-27 | Disposition: A | Discharge status: Home / Self Care | Payer: Medicare Other | Attending: Emergency Medicine | Admitting: Emergency Medicine

## 2015-03-27 ENCOUNTER — Emergency Department (HOSPITAL_COMMUNITY)
Admission: EM | Admit: 2015-03-27 | Discharge: 2015-03-27 | Disposition: A | Discharge status: Home / Self Care | Payer: Medicare Other | Attending: Emergency Medicine | Admitting: Emergency Medicine

## 2015-03-27 DIAGNOSIS — Z7982 Long term (current) use of aspirin: Secondary | ICD-10-CM | POA: Insufficient documentation

## 2015-03-27 DIAGNOSIS — I872 Venous insufficiency (chronic) (peripheral): Secondary | ICD-10-CM | POA: Diagnosis not present

## 2015-03-27 DIAGNOSIS — R609 Edema, unspecified: Secondary | ICD-10-CM | POA: Diagnosis not present

## 2015-03-27 DIAGNOSIS — M8580 Other specified disorders of bone density and structure, unspecified site: Secondary | ICD-10-CM | POA: Diagnosis not present

## 2015-03-27 DIAGNOSIS — Z880 Allergy status to penicillin: Secondary | ICD-10-CM | POA: Diagnosis not present

## 2015-03-27 DIAGNOSIS — R52 Pain, unspecified: Secondary | ICD-10-CM | POA: Diagnosis not present

## 2015-03-27 DIAGNOSIS — M329 Systemic lupus erythematosus, unspecified: Secondary | ICD-10-CM | POA: Diagnosis not present

## 2015-03-27 DIAGNOSIS — L03115 Cellulitis of right lower limb: Secondary | ICD-10-CM | POA: Insufficient documentation

## 2015-03-27 DIAGNOSIS — Z8673 Personal history of transient ischemic attack (TIA), and cerebral infarction without residual deficits: Secondary | ICD-10-CM | POA: Diagnosis not present

## 2015-03-27 DIAGNOSIS — Z87891 Personal history of nicotine dependence: Secondary | ICD-10-CM | POA: Insufficient documentation

## 2015-03-27 DIAGNOSIS — I878 Other specified disorders of veins: Secondary | ICD-10-CM | POA: Diagnosis not present

## 2015-03-27 DIAGNOSIS — I69351 Hemiplegia and hemiparesis following cerebral infarction affecting right dominant side: Secondary | ICD-10-CM | POA: Diagnosis not present

## 2015-03-27 DIAGNOSIS — I69320 Aphasia following cerebral infarction: Secondary | ICD-10-CM | POA: Diagnosis not present

## 2015-03-27 DIAGNOSIS — Z862 Personal history of diseases of the blood and blood-forming organs and certain disorders involving the immune mechanism: Secondary | ICD-10-CM | POA: Diagnosis not present

## 2015-03-27 DIAGNOSIS — M7989 Other specified soft tissue disorders: Secondary | ICD-10-CM

## 2015-03-27 DIAGNOSIS — M79671 Pain in right foot: Secondary | ICD-10-CM | POA: Diagnosis not present

## 2015-03-27 DIAGNOSIS — Z79899 Other long term (current) drug therapy: Secondary | ICD-10-CM | POA: Insufficient documentation

## 2015-03-27 DIAGNOSIS — I10 Essential (primary) hypertension: Secondary | ICD-10-CM | POA: Insufficient documentation

## 2015-03-27 DIAGNOSIS — M79673 Pain in unspecified foot: Secondary | ICD-10-CM | POA: Diagnosis not present

## 2015-03-27 DIAGNOSIS — D649 Anemia, unspecified: Secondary | ICD-10-CM | POA: Diagnosis not present

## 2015-03-27 DIAGNOSIS — F329 Major depressive disorder, single episode, unspecified: Secondary | ICD-10-CM | POA: Insufficient documentation

## 2015-03-27 DIAGNOSIS — K219 Gastro-esophageal reflux disease without esophagitis: Secondary | ICD-10-CM | POA: Diagnosis not present

## 2015-03-27 LAB — CBC WITH DIFFERENTIAL/PLATELET
BASOS PCT: 0 %
Basophils Absolute: 0 10*3/uL (ref 0.0–0.1)
EOS ABS: 0.2 10*3/uL (ref 0.0–0.7)
Eosinophils Relative: 2 %
HCT: 36.7 % (ref 36.0–46.0)
Hemoglobin: 11.6 g/dL — ABNORMAL LOW (ref 12.0–15.0)
LYMPHS ABS: 2.1 10*3/uL (ref 0.7–4.0)
Lymphocytes Relative: 31 %
MCH: 25.9 pg — AB (ref 26.0–34.0)
MCHC: 31.6 g/dL (ref 30.0–36.0)
MCV: 81.9 fL (ref 78.0–100.0)
MONO ABS: 0.4 10*3/uL (ref 0.1–1.0)
MONOS PCT: 6 %
Neutro Abs: 4.2 10*3/uL (ref 1.7–7.7)
Neutrophils Relative %: 61 %
Platelets: 415 10*3/uL — ABNORMAL HIGH (ref 150–400)
RBC: 4.48 MIL/uL (ref 3.87–5.11)
RDW: 15.2 % (ref 11.5–15.5)
WBC: 6.8 10*3/uL (ref 4.0–10.5)

## 2015-03-27 LAB — BASIC METABOLIC PANEL
Anion gap: 7 (ref 5–15)
BUN: 14 mg/dL (ref 6–20)
CO2: 30 mmol/L (ref 22–32)
Calcium: 9.4 mg/dL (ref 8.9–10.3)
Chloride: 102 mmol/L (ref 101–111)
Creatinine, Ser: 0.66 mg/dL (ref 0.44–1.00)
Glucose, Bld: 99 mg/dL (ref 65–99)
Potassium: 4.5 mmol/L (ref 3.5–5.1)
SODIUM: 139 mmol/L (ref 135–145)

## 2015-03-27 MED ORDER — OXYCODONE-ACETAMINOPHEN 5-325 MG PO TABS
1.0000 | ORAL_TABLET | Freq: Once | ORAL | Status: AC
  Administered 2015-03-27: 1 via ORAL
  Filled 2015-03-27: qty 1

## 2015-03-27 MED ORDER — OXYCODONE-ACETAMINOPHEN 5-325 MG PO TABS: 1.0000 | ORAL_TABLET | Freq: Four times a day (QID) | ORAL | Status: DC | PRN

## 2015-03-27 MED ORDER — SULFAMETHOXAZOLE-TRIMETHOPRIM 800-160 MG PO TABS: 1.0000 | ORAL_TABLET | Freq: Two times a day (BID) | ORAL | Status: DC

## 2015-03-27 NOTE — ED Notes (Signed)
UNABLE TO COLLECT LABS AT THIS TIME NURSE WORKING WITH PATIENT.

## 2015-03-27 NOTE — ED Provider Notes (Signed)
CSN: [id number]     Arrival date & time 03/27/15  1457 History   First MD Initiated Contact with Patient 03/27/15 1515     Chief Complaint  Patient presents with  . Foot Pain     (Consider location/radiation/quality/duration/timing/severity/associated sxs/prior Treatment) HPI Comments: Wanda [name] is a 59 y.o. female with a PMHx of GERD, depression, HTN, seizures, SLE, venous insufficiency, osteopenia, CVA, SVT, and anemia, who presents to the ED with complaints of right foot pain and swelling. Level V caveat due to patient being nonverbal as a result of a stroke, no caregiver at bedside. Vision is unable to tell me how long this pain and swelling has been going on, but chart review reveals a visit on 9/23 for bilateral lower extremity swelling at which time they increased her HCTZ. Patient is unable to describe the pain at this time. Patient is able to  shake her head no when asked whether she has any chest pain, shortness of breath, abdominal pain, nausea, or vomiting. No further information can be obtained secondary to the patient's aphasia.  Patient is a 59 y.o. female presenting with lower extremity pain. The history is provided by the patient and medical records. The history is limited by the absence of a caregiver and the condition of the patient. No language interpreter was used.  Foot Pain This is a recurrent problem. The problem occurs constantly. The problem has been unchanged. Pertinent negatives include no abdominal pain, chest pain, nausea or vomiting.    Past Medical History  Diagnosis Date  . Depression   . GERD (gastroesophageal reflux disease)   . Hypertension   . 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 SLE (systemic lupus erythematosus)   . Venous insufficiency (chronic) (peripheral)   . Osteopenia   . ANEMIA-IRON DEFICIENCY 03/02/2006    H&H 8.5/25.9 5/09. On 5/13 12.8/39.7 with MCV 80.4.     Marland Kitchen. CEREBROVASCULAR ACCIDENT, HX OF 03/02/2006    Ruptured  berry aneurysm, right hemiparesis & expressive aphasia     . SVT (supraventricular tachycardia)     a. 03/2013 with mildly elevated troponin at that time.   Past Surgical History  Procedure Laterality Date  . Total abdominal hysterectomy  5/09    Benign cervix, uterine fibroids on bx  . Cerebral aneurysm repair      1990's  . Esophagogastroduodenoscopy  2001    Varices and esophagitis   Family History  Problem Relation Age of Onset  . Heart attack Brother 7460   Social History  Substance Use Topics  . Smoking status: Former Smoker -- 1.50 packs/day    Types: Cigarettes    Quit date: 05/30/2003  . Smokeless tobacco: Never Used     Comment: she smoked for 30 years before she quit  . Alcohol Use: No   OB History    No data available     Review of Systems  Unable to perform ROS: Patient nonverbal  Respiratory: Negative for shortness of breath.   Cardiovascular: Positive for leg swelling. Negative for chest pain.  Gastrointestinal: Negative for nausea, vomiting and abdominal pain.     Allergies  Lasix; Penicillins; and Sulfur  Home Medications   Prior to Admission medications   Medication Sig Start Date End Date Taking? Authorizing Provider  albuterol (PROVENTIL) (2.5 MG/3ML) 0.083% nebulizer solution Take 3 mLs (2.5 mg total) by nebulization every 6 (six) hours as needed for wheezing or shortness of breath. 03/28/14   Pasty Spillersracy N McLean-Scocozza,  MD  aspirin 81 MG tablet Take 81 mg by mouth daily.      Historical Provider, MD  atorvastatin (LIPITOR) 40 MG tablet Take 1 tablet (40 mg total) by mouth daily. 01/10/15   Jana Half, MD  calcium-vitamin D (OSCAL) 250-125 MG-UNIT per tablet Take 1 tablet by mouth daily.      Historical Provider, MD  Cholecalciferol (REPLESTA) 50000 UNITS WAFR Take 50,000 Units by mouth every 7 (seven) days. On wednesdays    Historical Provider, MD  denosumab (PROLIA) 60 MG/ML SOLN injection Inject 60 mg into the skin every 6 (six) months.  Administer in upper arm, thigh, or abdomen 03/27/13   Genelle Gather, MD  gabapentin (NEURONTIN) 300 MG capsule TAKE 1 CAPSULE (300 MG TOTAL) BY MOUTH 3 (THREE) TIMES DAILY. 05/16/14   Levert Feinstein, MD  hydrochlorothiazide (HYDRODIURIL) 12.5 MG tablet Take 2 tablets (25 mg total) by mouth daily. 02/08/15   Jana Half, MD  HYDROcodone-acetaminophen (NORCO/VICODIN) 5-325 MG per tablet Take 1 tablet by mouth every 6 (six) hours as needed for moderate pain. 08/31/13   Genelle Gather, MD  ipratropium (ATROVENT) 0.02 % nebulizer solution Take 2.5 mLs (0.5 mg total) by nebulization every 4 (four) hours as needed for wheezing or shortness of breath. 05/22/14   Genelle Gather, MD  levETIRAcetam (KEPPRA) 500 MG tablet Take 1 tablet (500 mg total) by mouth 2 (two) times daily. 05/16/14   Levert Feinstein, MD  loratadine (CLARITIN) 10 MG tablet TAKE 1 TABLET (10 MG TOTAL) BY MOUTH DAILY. 05/22/13   Genelle Gather, MD  metoCLOPramide (REGLAN) 5 MG tablet Take 1 tablet (5 mg total) by mouth every 6 (six) hours as needed for nausea. 11/20/14   Inez Catalina, MD  metoprolol (LOPRESSOR) 50 MG tablet Take 1.5 tablets (75 mg total) by mouth 2 (two) times daily. 05/16/14   Levert Feinstein, MD  Multiple Vitamins-Minerals (ADULT ONE DAILY GUMMIES) CHEW Chew 2 each by mouth daily.    Historical Provider, MD  omeprazole (PRILOSEC) 20 MG capsule Take 1 capsule (20 mg total) by mouth daily. 11/20/14   Inez Catalina, MD  potassium chloride (K-DUR) 10 MEQ tablet Take 1 tablet (10 mEq total) by mouth 2 (two) times daily. 02/08/15   Jana Half, MD  promethazine (PHENERGAN) 12.5 MG tablet Take 1 tablet (12.5 mg total) by mouth every 8 (eight) hours as needed for nausea. 03/14/13   Genelle Gather, MD  venlafaxine XR (EFFEXOR-XR) 75 MG 24 hr capsule Take 1 capsule (75 mg total) by mouth daily with breakfast. 02/08/15   Jana Half, MD   BP 170/99 mmHg  Pulse 88  Temp(Src) 97.7 F (36.5 C)  Resp 20   SpO2 96% Physical Exam  Constitutional: Vital signs are normal. She appears well-developed and well-nourished.  Non-toxic appearance. No distress.  Afebrile, nontoxic, NAD  HENT:  Head: Normocephalic and atraumatic.  Mouth/Throat: Oropharynx is clear and moist and mucous membranes are normal.  Eyes: Conjunctivae and EOM are normal. Right eye exhibits no discharge. Left eye exhibits no discharge.  Neck: Normal range of motion. Neck supple.  Cardiovascular: Normal rate, regular rhythm, normal heart sounds and intact distal pulses.  Exam reveals no gallop and no friction rub.   No murmur heard. RRR, nl s1/s2, no m/r/g, distal pulses intact, trace LLE pedal edema with 1+ RLE pitting edema  Pulmonary/Chest: Effort normal and breath sounds normal. No respiratory distress. She has no decreased breath  sounds. She has no wheezes. She has no rhonchi. She has no rales.  CTAB in all lung fields, no w/r/r, no hypoxia or increased WOB, SpO2 96-100% on RA  Transmitted upper airway sounds, but not audible during deep inspiration/auscultation  Abdominal: Soft. Normal appearance and bowel sounds are normal. She exhibits no distension. There is no tenderness. There is no rigidity, no rebound and no guarding.  Musculoskeletal: Normal range of motion.  Contractured R arm, diminished strength in R side which is baseline per chart review Sensation grossly intact in all extremities Distal pulses intact Trace LLE pedal edema with 1+ RLE pitting edema with mild erythema and warmth to the foot, flaking skin, hypertrophic yellowed toenails  Neurological: She is alert. No sensory deficit.  Aphasic  Skin: Skin is warm, dry and intact. No rash noted. There is erythema.  RLE erythema and warmth as noted above  Psychiatric: She has a normal mood and affect.  Nursing note and vitals reviewed.   ED Course  Procedures (including critical care time) Labs Review Labs Reviewed  CBC WITH DIFFERENTIAL/PLATELET - Abnormal;  Notable for the following:    Hemoglobin 11.6 (*)    MCH 25.9 (*)    Platelets 415 (*)    All other components within normal limits  BASIC METABOLIC PANEL    Imaging Review No results found.   Progress Notes by Glendale Chard at 03/27/2015 4:03 PM    Author: Glendale Chard Service: Vascular Lab Author Type: Cardiovascular Sonographer   Filed: 03/27/2015 4:04 PM Note Time: 03/27/2015 4:03 PM Status: Signed   Editor: Glendale Chard (Cardiovascular Sonographer)     Expand All Collapse All   *PRELIMINARY RESULTS* Vascular Ultrasound Right lower extremity venous duplex has been completed. Preliminary findings: Technically limited due to body habitus. No obvious DVT noted in visualized veins.   Farrel Demark, RDMS, RVT 03/27/2015, 4:03 PM      I have personally reviewed and evaluated these images and lab results as part of my medical decision-making.   EKG Interpretation None      MDM   Final diagnoses:  Peripheral edema  Cellulitis of right lower extremity  Venous stasis dermatitis of right lower extremity    59 y.o. female here with RLE swelling/erythema, and pain. Unknown chronicity. Pt nonverbal and no family members in the room during initial eval. Chart review reveals visit with PCP on 02/08/15 for BLE edema, increased her HCTZ dose. Today on exam, RLE erythematous, slightly warm to touch, and tender to palpation, will obtain basic labs and DVT study. Pt allergic to lasix. Denies CP/SOB, no hypoxia or tachycardia, doubt PE. Will reassess shortly.   6:47 PM Labs unremarkable, DVT study neg. Likely stasis dermatitis/cellulitis. Will send home with bactrim and pain meds. Will have her f/up with PCP in 5-7 days. I explained the diagnosis and have given explicit precautions to return to the ER including for any other new or worsening symptoms. The patient understands and accepts the medical plan as it's been dictated and I have answered their questions. Discharge instructions  concerning home care and prescriptions have been given. The patient is STABLE and is discharged to home in good condition.  BP 180/73 mmHg  Pulse 92  Temp(Src) 97.9 F (36.6 C) (Temporal)  Resp 25  SpO2 97%  Meds ordered this encounter  Medications  . oxyCODONE-acetaminophen (PERCOCET/ROXICET) 5-325 MG per tablet 1 tablet    Sig:   . oxyCODONE-acetaminophen (PERCOCET) 5-325 MG tablet    Sig: Take  1 tablet by mouth every 6 (six) hours as needed for severe pain.    Dispense:  6 tablet    Refill:  0    Order Specific Question:  Supervising Provider    Answer:  MILLER, BRIAN [[id number]]  . sulfamethoxazole-trimethoprim (BACTRIM DS,SEPTRA DS) 800-160 MG tablet    Sig: Take 1 tablet by mouth 2 (two) times daily.    Dispense:  14 tablet    Refill:  0    Order Specific Question:  Supervising Provider    Answer:  Eber Hong [[id number]]     Dustina Scoggin Camprubi-Soms, PA-C 03/27/15 1848  Alvira Monday, MD 03/28/15 1718  Alvira Monday, MD 04/09/15 1610

## 2015-03-27 NOTE — Discharge Instructions (Signed)
Keep leg clean and dry. Elevate the leg for help with swelling and pain. Take antibiotic until it is finished. Take ibuprofen or percocet as directed, as needed for pain but do not drive or operate machinery with pain medication use. Followup with Primary Care doctor in 5-7 days for recheck. Monitor area for signs of infection to include, but not limited to: increasing pain, spreading redness, drainage/pus, worsening swelling, or fevers. Return to emergency department for emergent changing or worsening symptoms.    Cellulitis Cellulitis is an infection of the skin and the tissue under the skin. The infected area is usually red and tender. This happens most often in the arms and lower legs. HOME CARE   Take your antibiotic medicine as told. Finish the medicine even if you start to feel better.  Keep the infected arm or leg raised (elevated).  Put a warm cloth on the area up to 4 times per day.  Only take medicines as told by your doctor.  Keep all doctor visits as told. GET HELP IF:  You see red streaks on the skin coming from the infected area.  Your red area gets bigger or turns a dark color.  Your bone or joint under the infected area is painful after the skin heals.  Your infection comes back in the same area or different area.  You have a puffy (swollen) bump in the infected area.  You have new symptoms.  You have a fever. GET HELP RIGHT AWAY IF:   You feel very sleepy.  You throw up (vomit) or have watery poop (diarrhea).  You feel sick and have muscle aches and pains.   This information is not intended to replace advice given to you by your health care provider. Make sure you discuss any questions you have with your health care provider.   Document Released: 10/21/2007 Document Revised: 01/23/2015 Document Reviewed: 07/20/2011 Elsevier Interactive Patient Education 2016 [employer]  Peripheral Edema You have swelling in your legs (peripheral edema). This  swelling is due to excess accumulation of salt and water in your body. Edema may be a sign of heart, kidney or liver disease, or a side effect of a medication. It may also be due to problems in the leg veins. Elevating your legs and using special support stockings may be very helpful, if the cause of the swelling is due to poor venous circulation. Avoid long periods of standing, whatever the cause. Treatment of edema depends on identifying the cause. Chips, pretzels, pickles and other salty foods should be avoided. Restricting salt in your diet is almost always needed. Water pills (diuretics) are often used to remove the excess salt and water from your body via urine. These medicines prevent the kidney from reabsorbing sodium. This increases urine flow. Diuretic treatment may also result in lowering of potassium levels in your body. Potassium supplements may be needed if you have to use diuretics daily. Daily weights can help you keep track of your progress in clearing your edema. You should call your caregiver for follow up care as recommended. SEEK IMMEDIATE MEDICAL CARE IF:   You have increased swelling, pain, redness, or heat in your legs.  You develop shortness of breath, especially when lying down.  You develop chest or abdominal pain, weakness, or fainting.  You have a fever.   This information is not intended to replace advice given to you by your health care provider. Make sure you discuss any questions you have with your health care provider.  Document Released: 06/11/2004 Document Revised: 07/27/2011 Document Reviewed: 11/14/2014 Elsevier Interactive Patient Education 2016 [employer] Inc[employer]  Stasis Dermatitis Stasis dermatitis occurs when veins lose the ability to pump blood back to the heart (poor venous circulation). It causes a reddish-purple to brownish scaly, itchy rash on the legs. The rash comes from pooling of blood (stasis). CAUSES  This occurs because the veins do not work  very well anymore or because pressure may be increased in the veins due to other conditions. With blood pooling, the increased pressure in the tiny blood vessels (capillaries) causes fluid to leak out of the capillaries into the tissue. The extra fluid makes it harder for the blood to feed the cells and get rid of waste products. SYMPTOMS  Stasis dermatitis appears as red, scaly, itchy patches on the legs. A yellowish or light brown discoloration is also present. Due to scratching or other injury, these patches can become an ulcer. This ulcer may remain for long periods of time. The ulcer can also become infected. Swelling of the legs is often present with stasis dermatitis. If the leg is swollen, this increases the risk of infection and further damage to the skin. Sometimes, intense itching, tingling, and burning occurs before signs of stasis dermatitis appear. You may find yourself scratching the insides of your ankles or rubbing your ankles together before the rash appears. After healing, there are often brown spots on the affected skin. DIAGNOSIS  Your caregiver makes this diagnosis based on an exam. Other tests may be done to better understand the cause. TREATMENT  If underlying conditions are present, they must be treated. Some of these conditions are heart failure, thyroid problems, poor nutrition, and varicose veins.  Cortisone creams and ointments applied to the skin (topically) may be needed, as well as medicine to reduce swelling in the legs (diuretics).  Compression stockings or an elastic wrap may also be needed to reduce swelling.  If there is an infection, antibiotic medicines may also be used. HOME CARE INSTRUCTIONS   Try to rest and raise (elevate) the affected leg above the level of the heart, if possible.  Burow's solution wet packs applied for 30 minutes, 3 times daily, will help the weepy rash. Stop using the packs before your skin gets too dry. You can also use a mixture of 3  parts white vinegar to 1 quart water.  Grease your legs daily with ointments, such as petroleum jelly, to fight dryness.  Avoid scratching or injuring the area. SEEK IMMEDIATE MEDICAL CARE IF:   Your rash gets worse.  An ulcer forms.  You have an oral temperature above 102 F (38.9 C), not controlled by medicine.  You have any other severe symptoms.   This information is not intended to replace advice given to you by your health care provider. Make sure you discuss any questions you have with your health care provider.   Document Released: 08/13/2005 Document Revised: 07/27/2011 Document Reviewed: 09/19/2014 Elsevier Interactive Patient Education [employer]

## 2015-03-27 NOTE — ED Notes (Signed)
Per EMS, pt came from home with right foot pain and swelling. Brother of pt. Was unsure of chronicity of condition. Hx. Of stroke. Wheezing breathing said to be baseline.

## 2015-03-27 NOTE — Progress Notes (Signed)
*  PRELIMINARY RESULTS* Vascular Ultrasound Right lower extremity venous duplex has been completed.  Preliminary findings: Technically limited due to body habitus. No obvious DVT noted in visualized veins.   Farrel DemarkJill Eunice, RDMS, RVT  03/27/2015, 4:03 PM

## 2015-03-27 NOTE — Telephone Encounter (Signed)
[facility] verbal order for speech therapy to continue for once a week until 04/14/2015.  She is working to get the patient speech device. If you have any questions or concerns now please do not hesitate to give her a call @ [phone] and she also be faxing a report once it is completed and will need to be signed off on once the therapy is over.

## 2015-03-27 NOTE — ED Notes (Signed)
Bed: WA06 Expected date:  Expected time:  Means of arrival:  Comments: EMS- 59yo F, foot swelling/HTN

## 2015-03-27 NOTE — ED Notes (Signed)
Lab delay due to Ultrasound procedure

## 2015-03-28 DIAGNOSIS — I69351 Hemiplegia and hemiparesis following cerebral infarction affecting right dominant side: Secondary | ICD-10-CM | POA: Diagnosis not present

## 2015-03-28 DIAGNOSIS — D649 Anemia, unspecified: Secondary | ICD-10-CM | POA: Diagnosis not present

## 2015-03-28 DIAGNOSIS — M329 Systemic lupus erythematosus, unspecified: Secondary | ICD-10-CM | POA: Diagnosis not present

## 2015-03-28 DIAGNOSIS — I69320 Aphasia following cerebral infarction: Secondary | ICD-10-CM | POA: Diagnosis not present

## 2015-03-28 DIAGNOSIS — F329 Major depressive disorder, single episode, unspecified: Secondary | ICD-10-CM | POA: Diagnosis not present

## 2015-03-28 DIAGNOSIS — I10 Essential (primary) hypertension: Secondary | ICD-10-CM | POA: Diagnosis not present

## 2015-03-28 NOTE — Telephone Encounter (Signed)
Faxed to PCP - Home services aware will they will be notified with Dr Ladona Ridgelaylor response.

## 2015-03-29 NOTE — Telephone Encounter (Signed)
Ms. Ditzler,  [facility]  I agree with extending her therapy.  Dr. Ladona Ridgelaylor

## 2015-03-29 NOTE — Telephone Encounter (Signed)
[facility] - Dr Ladona Ridgelaylor response.

## 2015-04-01 DIAGNOSIS — I10 Essential (primary) hypertension: Secondary | ICD-10-CM | POA: Diagnosis not present

## 2015-04-01 DIAGNOSIS — D649 Anemia, unspecified: Secondary | ICD-10-CM | POA: Diagnosis not present

## 2015-04-01 DIAGNOSIS — I69351 Hemiplegia and hemiparesis following cerebral infarction affecting right dominant side: Secondary | ICD-10-CM | POA: Diagnosis not present

## 2015-04-01 DIAGNOSIS — F329 Major depressive disorder, single episode, unspecified: Secondary | ICD-10-CM | POA: Diagnosis not present

## 2015-04-01 DIAGNOSIS — M329 Systemic lupus erythematosus, unspecified: Secondary | ICD-10-CM | POA: Diagnosis not present

## 2015-04-01 DIAGNOSIS — I69320 Aphasia following cerebral infarction: Secondary | ICD-10-CM | POA: Diagnosis not present

## 2015-04-08 DIAGNOSIS — I69320 Aphasia following cerebral infarction: Secondary | ICD-10-CM | POA: Diagnosis not present

## 2015-04-08 DIAGNOSIS — D649 Anemia, unspecified: Secondary | ICD-10-CM | POA: Diagnosis not present

## 2015-04-08 DIAGNOSIS — I10 Essential (primary) hypertension: Secondary | ICD-10-CM | POA: Diagnosis not present

## 2015-04-08 DIAGNOSIS — I69351 Hemiplegia and hemiparesis following cerebral infarction affecting right dominant side: Secondary | ICD-10-CM | POA: Diagnosis not present

## 2015-04-08 DIAGNOSIS — F329 Major depressive disorder, single episode, unspecified: Secondary | ICD-10-CM | POA: Diagnosis not present

## 2015-04-08 DIAGNOSIS — M329 Systemic lupus erythematosus, unspecified: Secondary | ICD-10-CM | POA: Diagnosis not present

## 2015-04-12 DIAGNOSIS — I10 Essential (primary) hypertension: Secondary | ICD-10-CM | POA: Diagnosis not present

## 2015-04-12 DIAGNOSIS — M329 Systemic lupus erythematosus, unspecified: Secondary | ICD-10-CM | POA: Diagnosis not present

## 2015-04-12 DIAGNOSIS — I69320 Aphasia following cerebral infarction: Secondary | ICD-10-CM | POA: Diagnosis not present

## 2015-04-12 DIAGNOSIS — I69351 Hemiplegia and hemiparesis following cerebral infarction affecting right dominant side: Secondary | ICD-10-CM | POA: Diagnosis not present

## 2015-04-12 DIAGNOSIS — F329 Major depressive disorder, single episode, unspecified: Secondary | ICD-10-CM | POA: Diagnosis not present

## 2015-04-12 DIAGNOSIS — D649 Anemia, unspecified: Secondary | ICD-10-CM | POA: Diagnosis not present

## 2015-04-15 DIAGNOSIS — I10 Essential (primary) hypertension: Secondary | ICD-10-CM | POA: Diagnosis not present

## 2015-04-15 DIAGNOSIS — M329 Systemic lupus erythematosus, unspecified: Secondary | ICD-10-CM | POA: Diagnosis not present

## 2015-04-15 DIAGNOSIS — I69320 Aphasia following cerebral infarction: Secondary | ICD-10-CM | POA: Diagnosis not present

## 2015-04-15 DIAGNOSIS — K219 Gastro-esophageal reflux disease without esophagitis: Secondary | ICD-10-CM | POA: Diagnosis not present

## 2015-04-15 DIAGNOSIS — F329 Major depressive disorder, single episode, unspecified: Secondary | ICD-10-CM | POA: Diagnosis not present

## 2015-04-15 DIAGNOSIS — Z87891 Personal history of nicotine dependence: Secondary | ICD-10-CM | POA: Diagnosis not present

## 2015-04-15 DIAGNOSIS — I69351 Hemiplegia and hemiparesis following cerebral infarction affecting right dominant side: Secondary | ICD-10-CM | POA: Diagnosis not present

## 2015-04-15 DIAGNOSIS — D649 Anemia, unspecified: Secondary | ICD-10-CM | POA: Diagnosis not present

## 2015-04-15 DIAGNOSIS — I872 Venous insufficiency (chronic) (peripheral): Secondary | ICD-10-CM | POA: Diagnosis not present

## 2015-04-16 ENCOUNTER — Other Ambulatory Visit: Payer: Self-pay | Admitting: Internal Medicine

## 2015-04-16 ENCOUNTER — Telehealth: Payer: Self-pay | Admitting: *Deleted

## 2015-04-16 DIAGNOSIS — G40909 Epilepsy, unspecified, not intractable, without status epilepticus: Secondary | ICD-10-CM

## 2015-04-16 DIAGNOSIS — I471 Supraventricular tachycardia, unspecified: Secondary | ICD-10-CM

## 2015-04-16 DIAGNOSIS — I10 Essential (primary) hypertension: Secondary | ICD-10-CM

## 2015-04-16 NOTE — Telephone Encounter (Signed)
Received call from Allen DerryAllison Mallory with [facility] [phone])-requesting verbal order for an additional 60 days of speech therapy for patient.  States she is currently working on trying to obtain a "communication device" for patient.  During this time period, she will be sending in paperwork that will need to be completed by our office. . Verbal order given for continuation of services.  Will let pt's pcp know, phone call complete .Kingsley SpittleGoldston, Darlene Cassady11/29/20162:47 PM

## 2015-04-17 DIAGNOSIS — I10 Essential (primary) hypertension: Secondary | ICD-10-CM | POA: Diagnosis not present

## 2015-04-17 DIAGNOSIS — I69351 Hemiplegia and hemiparesis following cerebral infarction affecting right dominant side: Secondary | ICD-10-CM | POA: Diagnosis not present

## 2015-04-17 DIAGNOSIS — D649 Anemia, unspecified: Secondary | ICD-10-CM | POA: Diagnosis not present

## 2015-04-17 DIAGNOSIS — M329 Systemic lupus erythematosus, unspecified: Secondary | ICD-10-CM | POA: Diagnosis not present

## 2015-04-17 DIAGNOSIS — F329 Major depressive disorder, single episode, unspecified: Secondary | ICD-10-CM | POA: Diagnosis not present

## 2015-04-17 DIAGNOSIS — I69320 Aphasia following cerebral infarction: Secondary | ICD-10-CM | POA: Diagnosis not present

## 2015-04-17 MED ORDER — GABAPENTIN 300 MG PO CAPS: 300.0000 mg | ORAL_CAPSULE | Freq: Three times a day (TID) | ORAL | Status: DC

## 2015-04-17 MED ORDER — METOPROLOL TARTRATE 50 MG PO TABS: 75.0000 mg | ORAL_TABLET | Freq: Two times a day (BID) | ORAL | Status: DC

## 2015-04-17 MED ORDER — LEVETIRACETAM 500 MG PO TABS: 500.0000 mg | ORAL_TABLET | Freq: Two times a day (BID) | ORAL | Status: DC

## 2015-04-22 DIAGNOSIS — I69351 Hemiplegia and hemiparesis following cerebral infarction affecting right dominant side: Secondary | ICD-10-CM | POA: Diagnosis not present

## 2015-04-22 DIAGNOSIS — M329 Systemic lupus erythematosus, unspecified: Secondary | ICD-10-CM | POA: Diagnosis not present

## 2015-04-22 DIAGNOSIS — I69320 Aphasia following cerebral infarction: Secondary | ICD-10-CM | POA: Diagnosis not present

## 2015-04-22 DIAGNOSIS — F329 Major depressive disorder, single episode, unspecified: Secondary | ICD-10-CM | POA: Diagnosis not present

## 2015-04-22 DIAGNOSIS — D649 Anemia, unspecified: Secondary | ICD-10-CM | POA: Diagnosis not present

## 2015-04-22 DIAGNOSIS — I10 Essential (primary) hypertension: Secondary | ICD-10-CM | POA: Diagnosis not present

## 2015-04-24 ENCOUNTER — Telehealth: Payer: Self-pay | Admitting: *Deleted

## 2015-04-24 NOTE — Telephone Encounter (Signed)
I agree. Continue home health services.

## 2015-04-24 NOTE — Telephone Encounter (Signed)
[facility],it was given,  do you agree?

## 2015-04-25 DIAGNOSIS — I69320 Aphasia following cerebral infarction: Secondary | ICD-10-CM | POA: Diagnosis not present

## 2015-04-25 DIAGNOSIS — I69351 Hemiplegia and hemiparesis following cerebral infarction affecting right dominant side: Secondary | ICD-10-CM | POA: Diagnosis not present

## 2015-04-25 DIAGNOSIS — M329 Systemic lupus erythematosus, unspecified: Secondary | ICD-10-CM | POA: Diagnosis not present

## 2015-04-25 DIAGNOSIS — I10 Essential (primary) hypertension: Secondary | ICD-10-CM | POA: Diagnosis not present

## 2015-04-25 DIAGNOSIS — D649 Anemia, unspecified: Secondary | ICD-10-CM | POA: Diagnosis not present

## 2015-04-25 DIAGNOSIS — F329 Major depressive disorder, single episode, unspecified: Secondary | ICD-10-CM | POA: Diagnosis not present

## 2015-05-02 DIAGNOSIS — F329 Major depressive disorder, single episode, unspecified: Secondary | ICD-10-CM | POA: Diagnosis not present

## 2015-05-02 DIAGNOSIS — I69320 Aphasia following cerebral infarction: Secondary | ICD-10-CM | POA: Diagnosis not present

## 2015-05-02 DIAGNOSIS — I10 Essential (primary) hypertension: Secondary | ICD-10-CM | POA: Diagnosis not present

## 2015-05-02 DIAGNOSIS — M329 Systemic lupus erythematosus, unspecified: Secondary | ICD-10-CM | POA: Diagnosis not present

## 2015-05-02 DIAGNOSIS — D649 Anemia, unspecified: Secondary | ICD-10-CM | POA: Diagnosis not present

## 2015-05-02 DIAGNOSIS — I69351 Hemiplegia and hemiparesis following cerebral infarction affecting right dominant side: Secondary | ICD-10-CM | POA: Diagnosis not present

## 2015-05-06 DIAGNOSIS — I69320 Aphasia following cerebral infarction: Secondary | ICD-10-CM | POA: Diagnosis not present

## 2015-05-06 DIAGNOSIS — M329 Systemic lupus erythematosus, unspecified: Secondary | ICD-10-CM | POA: Diagnosis not present

## 2015-05-06 DIAGNOSIS — F329 Major depressive disorder, single episode, unspecified: Secondary | ICD-10-CM | POA: Diagnosis not present

## 2015-05-06 DIAGNOSIS — I69351 Hemiplegia and hemiparesis following cerebral infarction affecting right dominant side: Secondary | ICD-10-CM | POA: Diagnosis not present

## 2015-05-06 DIAGNOSIS — D649 Anemia, unspecified: Secondary | ICD-10-CM | POA: Diagnosis not present

## 2015-05-06 DIAGNOSIS — I10 Essential (primary) hypertension: Secondary | ICD-10-CM | POA: Diagnosis not present

## 2015-05-09 ENCOUNTER — Encounter: Payer: Self-pay | Admitting: Internal Medicine

## 2015-05-09 ENCOUNTER — Ambulatory Visit (INDEPENDENT_AMBULATORY_CARE_PROVIDER_SITE_OTHER): Payer: Medicare Other | Admitting: Internal Medicine

## 2015-05-09 VITALS — BP 162/86 | HR 74 | Temp 97.8°F | Wt 209.3 lb

## 2015-05-09 DIAGNOSIS — Z7982 Long term (current) use of aspirin: Secondary | ICD-10-CM

## 2015-05-09 DIAGNOSIS — Z8673 Personal history of transient ischemic attack (TIA), and cerebral infarction without residual deficits: Secondary | ICD-10-CM

## 2015-05-09 DIAGNOSIS — R221 Localized swelling, mass and lump, neck: Secondary | ICD-10-CM

## 2015-05-09 DIAGNOSIS — S161XXA Strain of muscle, fascia and tendon at neck level, initial encounter: Secondary | ICD-10-CM | POA: Insufficient documentation

## 2015-05-09 DIAGNOSIS — I10 Essential (primary) hypertension: Secondary | ICD-10-CM | POA: Diagnosis not present

## 2015-05-09 DIAGNOSIS — Z87891 Personal history of nicotine dependence: Secondary | ICD-10-CM | POA: Diagnosis not present

## 2015-05-09 MED ORDER — LISINOPRIL 10 MG PO TABS: 10.0000 mg | ORAL_TABLET | Freq: Every day | ORAL | Status: DC

## 2015-05-09 MED ORDER — CYCLOBENZAPRINE HCL 5 MG PO TABS: 5.0000 mg | ORAL_TABLET | Freq: Every evening | ORAL | Status: DC | PRN

## 2015-05-09 NOTE — Assessment & Plan Note (Signed)
Pt's caregiver states she has a knot on the right side of her neck. Pt has a hx of CVA and does not move around much. On exam there is a palpable knot that is not present on the left side. Most likely due to muscle strain. Will give flexeril 5mg  qhs prn x 14 days. Re evaluate in 1 month.

## 2015-05-09 NOTE — Assessment & Plan Note (Signed)
BP Readings from Last 3 Encounters:  05/09/15 162/86  03/27/15 144/78  02/08/15 160/80    Lab Results  Component Value Date   NA 139 03/27/2015   K 4.5 03/27/2015   CREATININE 0.66 03/27/2015    Assessment: Blood pressure control:  uncontrolled Progress toward BP goal:   deteriorated  Comments: Pt has a home health PT who noted that her BPs were elevated and previously were normal in the past. She has been on norvasc and lisinopril in the past but was taken off due to low nl BPs. Eating well, does not have DM, and has actually lost weight.   Plan: Medications:  She has LE edema, thus will start lisinopril 10mg  instead of norvasc at this time. Cont metoprolol 75mg  BID and HCTZ 25mg .  Educational resources provided:   Self management tools provided:   Other plans: f/u in 1 month to check a BMET and adjust lisinopril as needed. Can also add norvasc as an ACEinh will counteract the edema effects of norvasc.

## 2015-05-09 NOTE — Patient Instructions (Signed)
Start taking lisinopril 10mg  once a day. You will return to the clinic in 1 month to recheck blood pressure and get blood work.   You can take flexeril 5mg  as needed at night for muscle spasm in her rt neck.    Cyclobenzaprine tablets What is this medicine? CYCLOBENZAPRINE (sye kloe BEN za preen) is a muscle relaxer. It is used to treat muscle pain, spasms, and stiffness. This medicine may be used for other purposes; ask your health care provider or pharmacist if you have questions. What should I tell my health care provider before I take this medicine? They need to know if you have any of these conditions: -heart disease, irregular heartbeat, or previous heart attack -liver disease -thyroid problem -an unusual or allergic reaction to cyclobenzaprine, tricyclic antidepressants, lactose, other medicines, foods, dyes, or preservatives -pregnant or trying to get pregnant -breast-feeding How should I use this medicine? Take this medicine by mouth with a glass of water. Follow the directions on the prescription label. If this medicine upsets your stomach, take it with food or milk. Take your medicine at regular intervals. Do not take it more often than directed. Talk to your pediatrician regarding the use of this medicine in children. Special care may be needed. Overdosage: If you think you have taken too much of this medicine contact a poison control center or emergency room at once. NOTE: This medicine is only for you. Do not share this medicine with others. What if I miss a dose? If you miss a dose, take it as soon as you can. If it is almost time for your next dose, take only that dose. Do not take double or extra doses. What may interact with this medicine? Do not take this medicine with any of the following medications: -certain medicines for fungal infections like fluconazole, itraconazole, ketoconazole, posaconazole,  voriconazole -cisapride -dofetilide -dronedarone -droperidol -flecainide -grepafloxacin -halofantrine -levomethadyl -MAOIs like Carbex, Eldepryl, Marplan, Nardil, and Parnate -nilotinib -pimozide -probucol -sertindole -thioridazine -ziprasidone This medicine may also interact with the following medications: -abarelix -alcohol -certain medicines for cancer -certain medicines for depression, anxiety, or psychotic disturbances -certain medicines for infection like alfuzosin, chloroquine, clarithromycin, levofloxacin, mefloquine, pentamidine, troleandomycin -certain medicines for an irregular heart beat -certain medicines used for sleep or numbness during surgery or procedure -contrast dyes -dolasetron -guanethidine -methadone -octreotide -ondansetron -other medicines that prolong the QT interval (cause an abnormal heart rhythm) -palonosetron -phenothiazines like chlorpromazine, mesoridazine, prochlorperazine, thioridazine -tramadol -vardenafil This list may not describe all possible interactions. Give your health care provider a list of all the medicines, herbs, non-prescription drugs, or dietary supplements you use. Also tell them if you smoke, drink alcohol, or use illegal drugs. Some items may interact with your medicine. What should I watch for while using this medicine? Check with your doctor or health care professional if your condition does not improve within 1 to 3 weeks. You may get drowsy or dizzy when you first start taking the medicine or change doses. Do not drive, use machinery, or do anything that may be dangerous until you know how the medicine affects you. Stand or sit up slowly. Your mouth may get dry. Drinking water, chewing sugarless gum, or sucking on hard candy may help. What side effects may I notice from receiving this medicine? Side effects that you should report to your doctor or health care professional as soon as possible: -allergic reactions like  skin rash, itching or hives, swelling of the face, lips, or tongue -chest pain -fast heartbeat -  hallucinations -seizures -vomiting Side effects that usually do not require medical attention (report to your doctor or health care professional if they continue or are bothersome): -headache This list may not describe all possible side effects. Call your doctor for medical advice about side effects. You may report side effects to FDA at 1-800-FDA-1088. Where should I keep my medicine? Keep out of the reach of children. Store at room temperature between 15 and 30 degrees C (59 and 86 degrees F). Keep container tightly closed. Throw away any unused medicine after the expiration date. NOTE: This sheet is a summary. It may not cover all possible information. If you have questions about this medicine, talk to your doctor, pharmacist, or health care provider.    2016, [employer]vier/[employer] Standard. (2012-11-29 12:48:19)

## 2015-05-09 NOTE — Progress Notes (Signed)
Subjective:   Patient ID: Wanda [name] female   DOB: [birth year] 59 y.o.   MRN: [id number]  HPI: Wanda [name] is a 59 y.o. with past medical history as outlined below who presents to clinic for BP f/u. Her caregiver and her son are with her today. She lives with her 2 sons who help take care of her. Her caregiver states that she has a knot on the right side of her neck. She has full ROM of her neck and is not contracted but does not move around much. Pt denies any pain or dysuria. Her son and caregiver do not have any other concerns.   Please see problem list for status of the pt's chronic medical problems.  Past Medical History  Diagnosis Date  . Depression   . GERD (gastroesophageal reflux disease)   . Hypertension   . 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 SLE (systemic lupus erythematosus) (HCC)   . Venous insufficiency (chronic) (peripheral)   . Osteopenia   . ANEMIA-IRON DEFICIENCY 03/02/2006    H&H 8.5/25.9 5/09. On 5/13 12.8/39.7 with MCV 80.4.     Marland Kitchen CEREBROVASCULAR ACCIDENT, HX OF 03/02/2006    Ruptured berry aneurysm, right hemiparesis & expressive aphasia     . SVT (supraventricular tachycardia) (HCC)     a. 03/2013 with mildly elevated troponin at that time.   Current Outpatient Prescriptions  Medication Sig Dispense Refill  . albuterol (PROVENTIL) (2.5 MG/3ML) 0.083% nebulizer solution Take 3 mLs (2.5 mg total) by nebulization every 6 (six) hours as needed for wheezing or shortness of breath. 75 mL 12  . aspirin 81 MG tablet Take 81 mg by mouth daily.      Marland Kitchen atorvastatin (LIPITOR) 40 MG tablet Take 1 tablet (40 mg total) by mouth daily. 30 tablet 6  . calcium-vitamin D (OSCAL) 250-125 MG-UNIT per tablet Take 1 tablet by mouth daily.      . Cholecalciferol (REPLESTA) 50000 UNITS WAFR Take 50,000 Units by mouth every 7 (seven) days. On wednesdays    . cyclobenzaprine (FLEXERIL) 5 MG tablet Take 1 tablet (5 mg total) by mouth at bedtime as  needed for muscle spasms. 14 tablet 1  . denosumab (PROLIA) 60 MG/ML SOLN injection Inject 60 mg into the skin every 6 (six) months. Administer in upper arm, thigh, or abdomen  0  . gabapentin (NEURONTIN) 300 MG capsule Take 1 capsule (300 mg total) by mouth 3 (three) times daily. 270 capsule 3  . hydrochlorothiazide (HYDRODIURIL) 12.5 MG tablet Take 2 tablets (25 mg total) by mouth daily. 30 tablet 6  . HYDROcodone-acetaminophen (NORCO/VICODIN) 5-325 MG per tablet Take 1 tablet by mouth every 6 (six) hours as needed for moderate pain. 60 tablet 0  . ipratropium (ATROVENT) 0.02 % nebulizer solution Take 2.5 mLs (0.5 mg total) by nebulization every 4 (four) hours as needed for wheezing or shortness of breath. 62.5 mL 2  . levETIRAcetam (KEPPRA) 500 MG tablet Take 1 tablet (500 mg total) by mouth 2 (two) times daily. 180 tablet 3  . lisinopril (PRINIVIL,ZESTRIL) 10 MG tablet Take 1 tablet (10 mg total) by mouth daily. 30 tablet 1  . loratadine (CLARITIN) 10 MG tablet TAKE 1 TABLET (10 MG TOTAL) BY MOUTH DAILY. 30 tablet 6  . metoCLOPramide (REGLAN) 5 MG tablet Take 1 tablet (5 mg total) by mouth every 6 (six) hours as needed for nausea. 360 tablet 0  . metoprolol (LOPRESSOR) 50 MG tablet Take  1.5 tablets (75 mg total) by mouth 2 (two) times daily. 270 tablet 3  . Multiple Vitamins-Minerals (ADULT ONE DAILY GUMMIES) CHEW Chew 2 each by mouth daily.    Marland Kitchen. omeprazole (PRILOSEC) 20 MG capsule Take 1 capsule (20 mg total) by mouth daily. 90 capsule 0  . oxyCODONE-acetaminophen (PERCOCET) 5-325 MG tablet Take 1 tablet by mouth every 6 (six) hours as needed for severe pain. 6 tablet 0  . potassium chloride (K-DUR) 10 MEQ tablet Take 1 tablet (10 mEq total) by mouth 2 (two) times daily. 180 tablet 3  . promethazine (PHENERGAN) 12.5 MG tablet Take 1 tablet (12.5 mg total) by mouth every 8 (eight) hours as needed for nausea. 45 tablet 2  . sulfamethoxazole-trimethoprim (BACTRIM DS,SEPTRA DS) 800-160 MG tablet  Take 1 tablet by mouth 2 (two) times daily. 14 tablet 0  . venlafaxine XR (EFFEXOR-XR) 75 MG 24 hr capsule Take 1 capsule (75 mg total) by mouth daily with breakfast. 90 capsule 3   No current facility-administered medications for this visit.   Family History  Problem Relation Age of Onset  . Heart attack Brother 4960   Social History   Social History  . Marital Status: Widowed    Spouse Name: N/A  . Number of Children: N/A  . Years of Education: N/A   Social History Main Topics  . Smoking status: Former Smoker -- 1.50 packs/day    Types: Cigarettes    Quit date: 05/30/2003  . Smokeless tobacco: Never Used     Comment: she smoked for 30 years before she quit  . Alcohol Use: No  . Drug Use: No  . Sexual Activity: Not Asked   Other Topics Concern  . None   Social History Narrative   Has a home nurse,    Lives with her 2 sons,    Former smoker   Widow         Review of Systems: Review of systems limited by pt's aphasia. She will not yes and no, denies pain and dysuria, otherwise doing well.   Objective:  Physical Exam: Filed Vitals:   05/09/15 0833  BP: 162/86  Pulse: 74  Temp: 97.8 F (36.6 C)  TempSrc: Oral  Weight: 209 lb 4.8 oz (94.938 kg)  SpO2: 98%   Physical Exam  Constitutional: She appears well-developed and well-nourished. No distress.  HENT:  Rt side of neck has a palpable 0.5 inch knot that is fixed. Left side of the neck WNL. No LAD  Cardiovascular: Regular rhythm and normal heart sounds.   No murmur heard. Pulmonary/Chest: Effort normal and breath sounds normal. No respiratory distress. She has no wheezes.  Abdominal: Soft. Bowel sounds are normal. She exhibits no distension.  Neurological: She is alert.  Skin: Skin is warm and dry.   Assessment & Plan:   Please see problem based assessment and plan.

## 2015-05-15 NOTE — Progress Notes (Signed)
[facility] Medicine Clinic Attending  Case discussed with Dr. Truong at the time of the visit.  We reviewed the resident's history and exam and pertinent patient test results.  I agree with the assessment, diagnosis, and plan of care documented in the resident's note.  

## 2015-05-15 NOTE — Addendum Note (Signed)
Addended by: Debe CoderMULLEN, Ali Mclaurin B on: 05/15/2015 04:18 PM   Modules accepted: Level of Service

## 2015-05-16 ENCOUNTER — Other Ambulatory Visit: Payer: Self-pay | Admitting: *Deleted

## 2015-05-16 DIAGNOSIS — M329 Systemic lupus erythematosus, unspecified: Secondary | ICD-10-CM | POA: Diagnosis not present

## 2015-05-16 DIAGNOSIS — F329 Major depressive disorder, single episode, unspecified: Secondary | ICD-10-CM | POA: Diagnosis not present

## 2015-05-16 DIAGNOSIS — I69351 Hemiplegia and hemiparesis following cerebral infarction affecting right dominant side: Secondary | ICD-10-CM | POA: Diagnosis not present

## 2015-05-16 DIAGNOSIS — I10 Essential (primary) hypertension: Secondary | ICD-10-CM | POA: Diagnosis not present

## 2015-05-16 DIAGNOSIS — I69320 Aphasia following cerebral infarction: Secondary | ICD-10-CM | POA: Diagnosis not present

## 2015-05-16 DIAGNOSIS — D649 Anemia, unspecified: Secondary | ICD-10-CM | POA: Diagnosis not present

## 2015-05-16 MED ORDER — HYDROCHLOROTHIAZIDE 25 MG PO TABS: 25.0000 mg | ORAL_TABLET | Freq: Every day | ORAL | Status: DC

## 2015-05-20 ENCOUNTER — Other Ambulatory Visit: Payer: Self-pay | Admitting: Internal Medicine

## 2015-05-20 DIAGNOSIS — S161XXA Strain of muscle, fascia and tendon at neck level, initial encounter: Secondary | ICD-10-CM

## 2015-05-23 DIAGNOSIS — I69351 Hemiplegia and hemiparesis following cerebral infarction affecting right dominant side: Secondary | ICD-10-CM | POA: Diagnosis not present

## 2015-05-23 DIAGNOSIS — D649 Anemia, unspecified: Secondary | ICD-10-CM | POA: Diagnosis not present

## 2015-05-23 DIAGNOSIS — I69320 Aphasia following cerebral infarction: Secondary | ICD-10-CM | POA: Diagnosis not present

## 2015-05-23 DIAGNOSIS — I10 Essential (primary) hypertension: Secondary | ICD-10-CM | POA: Diagnosis not present

## 2015-05-23 DIAGNOSIS — M329 Systemic lupus erythematosus, unspecified: Secondary | ICD-10-CM | POA: Diagnosis not present

## 2015-05-23 DIAGNOSIS — F329 Major depressive disorder, single episode, unspecified: Secondary | ICD-10-CM | POA: Diagnosis not present

## 2015-05-29 ENCOUNTER — Other Ambulatory Visit: Payer: Self-pay | Admitting: Internal Medicine

## 2015-05-29 DIAGNOSIS — F329 Major depressive disorder, single episode, unspecified: Secondary | ICD-10-CM | POA: Diagnosis not present

## 2015-05-29 DIAGNOSIS — M329 Systemic lupus erythematosus, unspecified: Secondary | ICD-10-CM | POA: Diagnosis not present

## 2015-05-29 DIAGNOSIS — I10 Essential (primary) hypertension: Secondary | ICD-10-CM | POA: Diagnosis not present

## 2015-05-29 DIAGNOSIS — D649 Anemia, unspecified: Secondary | ICD-10-CM | POA: Diagnosis not present

## 2015-05-29 DIAGNOSIS — I69320 Aphasia following cerebral infarction: Secondary | ICD-10-CM | POA: Diagnosis not present

## 2015-05-29 DIAGNOSIS — I69351 Hemiplegia and hemiparesis following cerebral infarction affecting right dominant side: Secondary | ICD-10-CM | POA: Diagnosis not present

## 2015-06-07 ENCOUNTER — Ambulatory Visit (INDEPENDENT_AMBULATORY_CARE_PROVIDER_SITE_OTHER): Payer: Medicare Other | Admitting: Internal Medicine

## 2015-06-07 ENCOUNTER — Encounter: Payer: Self-pay | Admitting: Internal Medicine

## 2015-06-07 VITALS — BP 159/81 | HR 77 | Temp 98.1°F | Ht 70.0 in | Wt 204.0 lb

## 2015-06-07 DIAGNOSIS — Z87891 Personal history of nicotine dependence: Secondary | ICD-10-CM

## 2015-06-07 DIAGNOSIS — M6249 Contracture of muscle, multiple sites: Secondary | ICD-10-CM | POA: Diagnosis not present

## 2015-06-07 DIAGNOSIS — Z7982 Long term (current) use of aspirin: Secondary | ICD-10-CM | POA: Diagnosis not present

## 2015-06-07 DIAGNOSIS — Z79891 Long term (current) use of opiate analgesic: Secondary | ICD-10-CM

## 2015-06-07 DIAGNOSIS — I10 Essential (primary) hypertension: Secondary | ICD-10-CM

## 2015-06-07 DIAGNOSIS — I69359 Hemiplegia and hemiparesis following cerebral infarction affecting unspecified side: Secondary | ICD-10-CM

## 2015-06-07 DIAGNOSIS — I639 Cerebral infarction, unspecified: Secondary | ICD-10-CM

## 2015-06-07 DIAGNOSIS — I69398 Other sequelae of cerebral infarction: Secondary | ICD-10-CM

## 2015-06-07 MED ORDER — POLYETHYLENE GLYCOL 3350 17 G PO PACK: 17.0000 g | PACK | Freq: Two times a day (BID) | ORAL | Status: DC

## 2015-06-07 MED ORDER — SENNOSIDES-DOCUSATE SODIUM 8.6-50 MG PO TABS: 2.0000 | ORAL_TABLET | Freq: Two times a day (BID) | ORAL | Status: DC

## 2015-06-07 MED ORDER — POLYETHYLENE GLYCOL 3350 17 G PO PACK
17.0000 g | PACK | Freq: Every day | ORAL | Status: DC | PRN
Start: 2015-06-07 — End: 2016-05-20

## 2015-06-07 MED ORDER — LISINOPRIL 10 MG PO TABS: 10.0000 mg | ORAL_TABLET | Freq: Every day | ORAL | Status: DC

## 2015-06-07 NOTE — Assessment & Plan Note (Signed)
BP Readings from Last 3 Encounters:  06/07/15 159/81  05/09/15 162/86  03/27/15 144/78    Lab Results  Component Value Date   NA 139 03/27/2015   K 4.5 03/27/2015   CREATININE 0.66 03/27/2015    Assessment: Blood pressure control:  poor Progress toward BP goal:    Comments: Patient has not taken Lisinopril in a week.  She is also on Kdur supplementation.  Unclear whether dizziness is related to the Lisinopril, as patient has had continued symptoms since stopping the medication.  Plan: Medications:  continue current medications, Lisinopril and HCTZ Educational resources provided:   Self management tools provided:   Other plans: Recheck BP and BMP in one month, while patient on medication.

## 2015-06-07 NOTE — Patient Instructions (Signed)
1. Take Lisinopril 10 mg (1 tab) once a day. 2. Take Senna 2 tabs twice a day to help with BM.  Can decrease dose with regular BMs. 3. Take Miralax (1 packet) once a day as needed to help with BM.  4. Stop Potassium 5. Return to clinic to recheck BP and labs.  Lisinopril tablets What is this medicine? LISINOPRIL (lyse IN oh pril) is an ACE inhibitor. This medicine is used to treat high blood pressure and heart failure. It is also used to protect the heart immediately after a heart attack. This medicine may be used for other purposes; ask your health care provider or pharmacist if you have questions. What should I tell my health care provider before I take this medicine? They need to know if you have any of these conditions: -diabetes -heart or blood vessel disease -kidney disease -low blood pressure -previous swelling of the tongue, face, or lips with difficulty breathing, difficulty swallowing, hoarseness, or tightening of the throat -an unusual or allergic reaction to lisinopril, other ACE inhibitors, insect venom, foods, dyes, or preservatives -pregnant or trying to get pregnant -breast-feeding How should I use this medicine? Take this medicine by mouth with a glass of water. Follow the directions on your prescription label. You may take this medicine with or without food. If it upsets your stomach, take it with food. Take your medicine at regular intervals. Do not take it more often than directed. Do not stop taking except on your doctor's advice. Talk to your pediatrician regarding the use of this medicine in children. Special care may be needed. While this drug may be prescribed for children as young as 21 years of age for selected conditions, precautions do apply. Overdosage: If you think you have taken too much of this medicine contact a poison control center or emergency room at once. NOTE: This medicine is only for you. Do not share this medicine with others. What if I miss a  dose? If you miss a dose, take it as soon as you can. If it is almost time for your next dose, take only that dose. Do not take double or extra doses. What may interact with this medicine? Do not take this medicine with any of the following medications: -hymenoptera venomThis medicines may also interact with the following medications: -aliskiren -angiotensin receptor blockers, like losartan or valsartan -certain medicines for diabetes -diuretics -everolimus -gold compounds -lithium -NSAIDs, medicines for pain and inflammation, like ibuprofen or naproxen -potassium salts or supplements -salt substitutes -sirolimus -temsirolimus This list may not describe all possible interactions. Give your health care provider a list of all the medicines, herbs, non-prescription drugs, or dietary supplements you use. Also tell them if you smoke, drink alcohol, or use illegal drugs. Some items may interact with your medicine. What should I watch for while using this medicine? Visit your doctor or health care professional for regular check ups. Check your blood pressure as directed. Ask your doctor what your blood pressure should be, and when you should contact him or her. Do not treat yourself for coughs, colds, or pain while you are using this medicine without asking your doctor or health care professional for advice. Some ingredients may increase your blood pressure. Women should inform their doctor if they wish to become pregnant or think they might be pregnant. There is a potential for serious side effects to an unborn child. Talk to your health care professional or pharmacist for more information. Check with your doctor or health care  professional if you get an attack of severe diarrhea, nausea and vomiting, or if you sweat a lot. The loss of too much body fluid can make it dangerous for you to take this medicine. You may get drowsy or dizzy. Do not drive, use machinery, or do anything that needs mental  alertness until you know how this drug affects you. Do not stand or sit up quickly, especially if you are an older patient. This reduces the risk of dizzy or fainting spells. Alcohol can make you more drowsy and dizzy. Avoid alcoholic drinks. Avoid salt substitutes unless you are told otherwise by your doctor or health care professional. What side effects may I notice from receiving this medicine? Side effects that you should report to your doctor or health care professional as soon as possible: -allergic reactions like skin rash, itching or hives, swelling of the hands, feet, face, lips, throat, or tongue -breathing problems -signs and symptoms of kidney injury like trouble passing urine or change in the amount of urine -signs and symptoms of increased potassium like muscle weakness; chest pain; or fast, irregular heartbeat -signs and symptoms of liver injury like dark yellow or brown urine; general ill feeling or flu-like symptoms; light-colored stools; loss of appetite; nausea; right upper belly pain; unusually weak or tired; yellowing of the eyes or skin -signs and symptoms of low blood pressure like dizziness; feeling faint or lightheaded, falls; unusually weak or tired -stomach pain with or without nausea and vomiting Side effects that usually do not require medical attention (report to your doctor or health care professional if they continue or are bothersome): -changes in taste -cough -dizziness -fever -headache -sensitivity to light This list may not describe all possible side effects. Call your doctor for medical advice about side effects. You may report side effects to FDA at 1-800-FDA-1088. Where should I keep my medicine? Keep out of the reach of children. Store at room temperature between 15 and 30 degrees C (59 and 86 degrees F). Protect from moisture. Keep container tightly closed. Throw away any unused medicine after the expiration date. NOTE: This sheet is a summary. It may  not cover all possible information. If you have questions about this medicine, talk to your doctor, pharmacist, or health care provider.    2016, [employer] Standard. (2014-12-27 20:38:20)

## 2015-06-07 NOTE — Assessment & Plan Note (Signed)
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  She had a colonoscopy in 2008, which was normal.  A/P: Likely constipation, though other intraabdominal pathology is possible (c/f underlying ovarian).  Will treat conservatively and reassess at next visit - Senna 2 tabs BID, titrate to regular BMs - Miralax qd, titrate to regular BM

## 2015-06-07 NOTE — Progress Notes (Signed)
Patient ID: Wanda [name], female   DOB: 1956/01/21, 60 y.o.   MRN: [id number]   Subjective:   Patient ID: Wanda [name] female   DOB: Nov 02, 1955 60 y.o.   MRN: [id number]  HPI: Wanda [name] is a 61 y.o. female with PMH as below, here for BP recheck.  Please see Problem-Based charting for the status of the patient's chronic medical issues.  She was started on Lisinopril last visit, with stopping of Amlodipine. Patient denies CP, SOB, HA, or cough. However, she reports feeling dizzy while on the medication.  She has not taken the medication for one week, but reports still feeling dizzy. She is wheelchair bound and does not ambulate.  She has not had any falls.  She has also had 2 weeks of LLQ abdominal pain.  She is wheelchair bound on chronic opioids.  She is normally constipated, having a BM once every 2 days. However, she has not had a BM in 5 days.  She had one episode of vomiting 2 days ago without repeat.  She had a normal colonoscopy in 2008.  She denies fever, chills, hematemesis, or blood in the stool.   Past Medical History  Diagnosis Date  . Depression   . GERD (gastroesophageal reflux disease)   . Hypertension   . 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 SLE (systemic lupus erythematosus) (HCC)   . Venous insufficiency (chronic) (peripheral)   . Osteopenia   . ANEMIA-IRON DEFICIENCY 03/02/2006    H&H 8.5/25.9 5/09. On 5/13 12.8/39.7 with MCV 80.4.     Marland Kitchen CEREBROVASCULAR ACCIDENT, HX OF 03/02/2006    Ruptured berry aneurysm, right hemiparesis & expressive aphasia     . SVT (supraventricular tachycardia) (HCC)     a. 03/2013 with mildly elevated troponin at that time.   Current Outpatient Prescriptions  Medication Sig Dispense Refill  . albuterol (PROVENTIL) (2.5 MG/3ML) 0.083% nebulizer solution Take 3 mLs (2.5 mg total) by nebulization every 6 (six) hours as needed for wheezing or shortness of breath. 75 mL 12  . aspirin 81 MG tablet Take 81 mg  by mouth daily.      Marland Kitchen atorvastatin (LIPITOR) 40 MG tablet Take 1 tablet (40 mg total) by mouth daily. 30 tablet 6  . calcium-vitamin D (OSCAL) 250-125 MG-UNIT per tablet Take 1 tablet by mouth daily.      . Cholecalciferol (REPLESTA) 50000 UNITS WAFR Take 50,000 Units by mouth every 7 (seven) days. On wednesdays    . cyclobenzaprine (FLEXERIL) 5 MG tablet TAKE 1 TABLET BY MOUTH AT BEDTIME AS NEEDED FOR MUSCLE SPASMS 14 tablet 0  . denosumab (PROLIA) 60 MG/ML SOLN injection Inject 60 mg into the skin every 6 (six) months. Administer in upper arm, thigh, or abdomen  0  . gabapentin (NEURONTIN) 300 MG capsule Take 1 capsule (300 mg total) by mouth 3 (three) times daily. 270 capsule 3  . hydrochlorothiazide (HYDRODIURIL) 25 MG tablet Take 1 tablet (25 mg total) by mouth daily. 90 tablet 3  . HYDROcodone-acetaminophen (NORCO/VICODIN) 5-325 MG per tablet Take 1 tablet by mouth every 6 (six) hours as needed for moderate pain. 60 tablet 0  . ipratropium (ATROVENT) 0.02 % nebulizer solution Take 2.5 mLs (0.5 mg total) by nebulization every 4 (four) hours as needed for wheezing or shortness of breath. 62.5 mL 2  . levETIRAcetam (KEPPRA) 500 MG tablet Take 1 tablet (500 mg total) by mouth 2 (two) times daily. 180 tablet 3  .  lisinopril (PRINIVIL,ZESTRIL) 10 MG tablet TAKE 1 TABLET BY MOUTH EVERY DAY 30 tablet 3  . loratadine (CLARITIN) 10 MG tablet TAKE 1 TABLET (10 MG TOTAL) BY MOUTH DAILY. 30 tablet 6  . metoCLOPramide (REGLAN) 5 MG tablet Take 1 tablet (5 mg total) by mouth every 6 (six) hours as needed for nausea. 360 tablet 0  . metoprolol (LOPRESSOR) 50 MG tablet Take 1.5 tablets (75 mg total) by mouth 2 (two) times daily. 270 tablet 3  . Multiple Vitamins-Minerals (ADULT ONE DAILY GUMMIES) CHEW Chew 2 each by mouth daily.    Marland Kitchen omeprazole (PRILOSEC) 20 MG capsule Take 1 capsule (20 mg total) by mouth daily. 90 capsule 0  . oxyCODONE-acetaminophen (PERCOCET) 5-325 MG tablet Take 1 tablet by mouth every 6  (six) hours as needed for severe pain. 6 tablet 0  . potassium chloride (K-DUR) 10 MEQ tablet Take 1 tablet (10 mEq total) by mouth 2 (two) times daily. 180 tablet 3  . promethazine (PHENERGAN) 12.5 MG tablet Take 1 tablet (12.5 mg total) by mouth every 8 (eight) hours as needed for nausea. 45 tablet 2  . sulfamethoxazole-trimethoprim (BACTRIM DS,SEPTRA DS) 800-160 MG tablet Take 1 tablet by mouth 2 (two) times daily. 14 tablet 0  . venlafaxine XR (EFFEXOR-XR) 75 MG 24 hr capsule Take 1 capsule (75 mg total) by mouth daily with breakfast. 90 capsule 3   No current facility-administered medications for this visit.   Family History  Problem Relation Age of Onset  . Heart attack Brother 54   Social History   Social History  . Marital Status: Widowed    Spouse Name: N/A  . Number of Children: N/A  . Years of Education: N/A   Social History Main Topics  . Smoking status: Former Smoker -- 1.50 packs/day    Types: Cigarettes    Quit date: 05/30/2003  . Smokeless tobacco: Never Used     Comment: she smoked for 30 years before she quit  . Alcohol Use: No  . Drug Use: No  . Sexual Activity: Not Asked   Other Topics Concern  . None   Social History Narrative   Has a home nurse,    Lives with her 2 sons,    Former smoker   Widow         Review of Systems: Pertinent items are noted in HPI. Objective:  Physical Exam: Filed Vitals:   06/07/15 1104  BP: 159/81  Pulse: 77  Temp: 98.1 F (36.7 C)  TempSrc: Oral  Height:  (1.778 m)  Weight: 204 lb (92.534 kg)  SpO2: 96%   Physical Exam  Constitutional: She is well-developed, well-nourished, and in no distress. No distress.  HENT:  Head: Normocephalic and atraumatic.  Eyes: EOM are normal. No scleral icterus.  Neck: No tracheal deviation present.  Cardiovascular: Normal rate, regular rhythm and normal heart sounds.   Pulmonary/Chest: Effort normal and breath sounds normal. No stridor. No respiratory distress. She has  no wheezes.  Abdominal: Soft. She exhibits no distension. There is no rebound and no guarding.  Mild TTP in left quadrants.  Palpable stool.   Musculoskeletal:  Right arm contractured.  Neurological: She is alert.  Skin: Skin is warm and dry. She is not diaphoretic.     Assessment & Plan:   Patient and case were discussed with Dr. Oswaldo Done.  Please refer to Problem Based charting for further documentation.

## 2015-06-10 NOTE — Progress Notes (Signed)
[facility] Attending  Case discussed with Dr. Taylor at the time of the visit.  We reviewed the resident's history and exam and pertinent patient test results.  I agree with the assessment, diagnosis, and plan of care documented in the resident's note. 

## 2015-06-12 DIAGNOSIS — I10 Essential (primary) hypertension: Secondary | ICD-10-CM | POA: Diagnosis not present

## 2015-06-12 DIAGNOSIS — I69320 Aphasia following cerebral infarction: Secondary | ICD-10-CM | POA: Diagnosis not present

## 2015-06-12 DIAGNOSIS — F329 Major depressive disorder, single episode, unspecified: Secondary | ICD-10-CM | POA: Diagnosis not present

## 2015-06-12 DIAGNOSIS — I69351 Hemiplegia and hemiparesis following cerebral infarction affecting right dominant side: Secondary | ICD-10-CM | POA: Diagnosis not present

## 2015-06-12 DIAGNOSIS — D649 Anemia, unspecified: Secondary | ICD-10-CM | POA: Diagnosis not present

## 2015-06-12 DIAGNOSIS — M329 Systemic lupus erythematosus, unspecified: Secondary | ICD-10-CM | POA: Diagnosis not present

## 2015-07-04 ENCOUNTER — Telehealth: Payer: Self-pay | Admitting: Internal Medicine

## 2015-07-04 NOTE — Telephone Encounter (Signed)
APPT REMINDER CALL, LMTCB IF SHE NEEDS TO CANCEL °

## 2015-07-05 ENCOUNTER — Ambulatory Visit (INDEPENDENT_AMBULATORY_CARE_PROVIDER_SITE_OTHER): Payer: Medicare Other | Admitting: Internal Medicine

## 2015-07-05 ENCOUNTER — Encounter: Payer: Self-pay | Admitting: Internal Medicine

## 2015-07-05 VITALS — BP 152/81 | HR 81 | Temp 98.1°F | Ht 70.0 in | Wt 196.0 lb

## 2015-07-05 DIAGNOSIS — R1032 Left lower quadrant pain: Secondary | ICD-10-CM | POA: Diagnosis not present

## 2015-07-05 DIAGNOSIS — K5900 Constipation, unspecified: Secondary | ICD-10-CM | POA: Insufficient documentation

## 2015-07-05 DIAGNOSIS — I639 Cerebral infarction, unspecified: Secondary | ICD-10-CM

## 2015-07-05 NOTE — Patient Instructions (Signed)
1. Follow up with Podiatry. 2. Follow up with physical therapy. 3. Take the Senna 2 tabs twice daily as needed for constipation.  You can titrate them to soft, regular BMs.  Constipation, Adult Constipation is when a person has fewer than three bowel movements a week, has difficulty having a bowel movement, or has stools that are dry, hard, or larger than normal. As people grow older, constipation is more common. A low-fiber diet, not taking in enough fluids, and taking certain medicines may make constipation worse.  CAUSES   Certain medicines, such as antidepressants, pain medicine, iron supplements, antacids, and water pills.   Certain diseases, such as diabetes, irritable bowel syndrome (IBS), thyroid disease, or depression.   Not drinking enough water.   Not eating enough fiber-rich foods.   Stress or travel.   Lack of physical activity or exercise.   Ignoring the urge to have a bowel movement.   Using laxatives too much.  SIGNS AND SYMPTOMS   Having fewer than three bowel movements a week.   Straining to have a bowel movement.   Having stools that are hard, dry, or larger than normal.   Feeling full or bloated.   Pain in the lower abdomen.   Not feeling relief after having a bowel movement.  DIAGNOSIS  Your health care provider will take a medical history and perform a physical exam. Further testing may be done for severe constipation. Some tests may include:  A barium enema X-ray to examine your rectum, colon, and, sometimes, your small intestine.   A sigmoidoscopy to examine your lower colon.   A colonoscopy to examine your entire colon. TREATMENT  Treatment will depend on the severity of your constipation and what is causing it. Some dietary treatments include drinking more fluids and eating more fiber-rich foods. Lifestyle treatments may include regular exercise. If these diet and lifestyle recommendations do not help, your health care provider  may recommend taking over-the-counter laxative medicines to help you have bowel movements. Prescription medicines may be prescribed if over-the-counter medicines do not work.  HOME CARE INSTRUCTIONS   Eat foods that have a lot of fiber, such as fruits, vegetables, whole grains, and beans.  Limit foods high in fat and processed sugars, such as french fries, hamburgers, cookies, candies, and soda.   A fiber supplement may be added to your diet if you cannot get enough fiber from foods.   Drink enough fluids to keep your urine clear or pale yellow.   Exercise regularly or as directed by your health care provider.   Go to the restroom when you have the urge to go. Do not hold it.   Only take over-the-counter or prescription medicines as directed by your health care provider. Do not take other medicines for constipation without talking to your health care provider first.  SEEK IMMEDIATE MEDICAL CARE IF:   You have bright red blood in your stool.   Your constipation lasts for more than 4 days or gets worse.   You have abdominal or rectal pain.   You have thin, pencil-like stools.   You have unexplained weight loss. MAKE SURE YOU:   Understand these instructions.  Will watch your condition.  Will get help right away if you are not doing well or get worse.   This information is not intended to replace advice given to you by your health care provider. Make sure you discuss any questions you have with your health care provider.   Document Released: 01/31/2004 Document  Revised: 05/25/2014 Document Reviewed: 02/13/2013 Elsevier Interactive Patient Education [employer].

## 2015-07-05 NOTE — Progress Notes (Signed)
Patient ID: Wanda [name], female   DOB: 14-Nov-1955, 60 y.o.   MRN: [id number]   Subjective:   Patient ID: Wanda [name] female   DOB: June 19, 1955 60 y.o.   MRN: [id number]  HPI: [name] is a 60 y.o. female with PMH as below, here for f/u abdominal pain.  Please see Problem-Based charting for the status of the patient's chronic medical issues.     Past Medical History  Diagnosis Date  . Depression   . GERD (gastroesophageal reflux disease)   . Hypertension   . 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 SLE (systemic lupus erythematosus) (HCC)   . Venous insufficiency (chronic) (peripheral)   . Osteopenia   . ANEMIA-IRON DEFICIENCY 03/02/2006    H&H 8.5/25.9 5/09. On 5/13 12.8/39.7 with MCV 80.4.     Marland Kitchen CEREBROVASCULAR ACCIDENT, HX OF 03/02/2006    Ruptured berry aneurysm, right hemiparesis & expressive aphasia     . SVT (supraventricular tachycardia) (HCC)     a. 03/2013 with mildly elevated troponin at that time.   Current Outpatient Prescriptions  Medication Sig Dispense Refill  . albuterol (PROVENTIL) (2.5 MG/3ML) 0.083% nebulizer solution Take 3 mLs (2.5 mg total) by nebulization every 6 (six) hours as needed for wheezing or shortness of breath. 75 mL 12  . aspirin 81 MG tablet Take 81 mg by mouth daily.      Marland Kitchen atorvastatin (LIPITOR) 40 MG tablet Take 1 tablet (40 mg total) by mouth daily. 30 tablet 6  . calcium-vitamin D (OSCAL) 250-125 MG-UNIT per tablet Take 1 tablet by mouth daily.      . Cholecalciferol (REPLESTA) 50000 UNITS WAFR Take 50,000 Units by mouth every 7 (seven) days. On wednesdays    . cyclobenzaprine (FLEXERIL) 5 MG tablet TAKE 1 TABLET BY MOUTH AT BEDTIME AS NEEDED FOR MUSCLE SPASMS 14 tablet 0  . denosumab (PROLIA) 60 MG/ML SOLN injection Inject 60 mg into the skin every 6 (six) months. Administer in upper arm, thigh, or abdomen  0  . gabapentin (NEURONTIN) 300 MG capsule Take 1 capsule (300 mg total) by mouth 3 (three) times  daily. 270 capsule 3  . hydrochlorothiazide (HYDRODIURIL) 25 MG tablet Take 1 tablet (25 mg total) by mouth daily. 90 tablet 3  . HYDROcodone-acetaminophen (NORCO/VICODIN) 5-325 MG per tablet Take 1 tablet by mouth every 6 (six) hours as needed for moderate pain. 60 tablet 0  . ipratropium (ATROVENT) 0.02 % nebulizer solution Take 2.5 mLs (0.5 mg total) by nebulization every 4 (four) hours as needed for wheezing or shortness of breath. 62.5 mL 2  . levETIRAcetam (KEPPRA) 500 MG tablet Take 1 tablet (500 mg total) by mouth 2 (two) times daily. 180 tablet 3  . lisinopril (PRINIVIL,ZESTRIL) 10 MG tablet Take 1 tablet (10 mg total) by mouth daily. 30 tablet 1  . loratadine (CLARITIN) 10 MG tablet TAKE 1 TABLET (10 MG TOTAL) BY MOUTH DAILY. 30 tablet 6  . metoCLOPramide (REGLAN) 5 MG tablet Take 1 tablet (5 mg total) by mouth every 6 (six) hours as needed for nausea. 360 tablet 0  . metoprolol (LOPRESSOR) 50 MG tablet Take 1.5 tablets (75 mg total) by mouth 2 (two) times daily. 270 tablet 3  . Multiple Vitamins-Minerals (ADULT ONE DAILY GUMMIES) CHEW Chew 2 each by mouth daily.    Marland Kitchen omeprazole (PRILOSEC) 20 MG capsule Take 1 capsule (20 mg total) by mouth daily. 90 capsule 0  . oxyCODONE-acetaminophen (PERCOCET) 5-325 MG  tablet Take 1 tablet by mouth every 6 (six) hours as needed for severe pain. 6 tablet 0  . polyethylene glycol (MIRALAX / GLYCOLAX) packet Take 17 g by mouth daily as needed. 100 each 0  . potassium chloride (K-DUR) 10 MEQ tablet Take 1 tablet (10 mEq total) by mouth 2 (two) times daily. 180 tablet 3  . promethazine (PHENERGAN) 12.5 MG tablet Take 1 tablet (12.5 mg total) by mouth every 8 (eight) hours as needed for nausea. 45 tablet 2  . senna-docusate (SENOKOT-S) 8.6-50 MG tablet Take 2 tablets by mouth 2 (two) times daily. 120 tablet 3  . sulfamethoxazole-trimethoprim (BACTRIM DS,SEPTRA DS) 800-160 MG tablet Take 1 tablet by mouth 2 (two) times daily. 14 tablet 0  . venlafaxine XR  (EFFEXOR-XR) 75 MG 24 hr capsule Take 1 capsule (75 mg total) by mouth daily with breakfast. 90 capsule 3   No current facility-administered medications for this visit.   Family History  Problem Relation Age of Onset  . Heart attack Brother 25   Social History   Social History  . Marital Status: Widowed    Spouse Name: N/A  . Number of Children: N/A  . Years of Education: N/A   Social History Main Topics  . Smoking status: Former Smoker -- 1.50 packs/day    Types: Cigarettes    Quit date: 05/30/2003  . Smokeless tobacco: Never Used     Comment: she smoked for 30 years before she quit  . Alcohol Use: No  . Drug Use: No  . Sexual Activity: Not on file   Other Topics Concern  . Not on file   Social History Narrative   Has a home nurse,    Lives with her 2 sons,    Former smoker   Widow         Review of Systems: No fevers, chills, or nausea.  She endorses vomiting 2/2 cough. She endorses occasional diarrhea and constipation. Objective:  Physical Exam: There were no vitals filed for this visit. Physical Exam  Constitutional: She is oriented to person, place, and time and well-developed, well-nourished, and in no distress. No distress.  HENT:  Head: Normocephalic and atraumatic.  Eyes: EOM are normal. No scleral icterus.  Neck: No tracheal deviation present.  Cardiovascular: Normal rate, regular rhythm and normal heart sounds.   Pulmonary/Chest: Effort normal and breath sounds normal. No stridor. No respiratory distress. She has no wheezes.  Abdominal: Soft. She exhibits no distension. There is no tenderness. There is no rebound and no guarding.  Musculoskeletal:  Right arm contracture.  Right foot with thickened yellow toenails and excessive scale on plantar aspect.  Neurological: She is alert and oriented to person, place, and time.  Skin: Skin is warm and dry. She is not diaphoretic.     Assessment & Plan:   Patient and case were discussed with Dr.  Cyndie Chime.  Please refer to Problem Based charting for further documentation.

## 2015-07-05 NOTE — Assessment & Plan Note (Signed)
Patient continues to complain of LLQ abdominal pain. She states the pain is worse in the afternoon and relieved with BM.  It has not changed since being seen last month. She is taking Miralax daily, but is not taking the Senna prescribed at last visit.  She endorses feeling constipated. She only has a BM every 2 days.  She denies weight loss or night sweats.  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  Will continue to escalate bowel regimen with Senna.  If symptoms persist, patient should undergo routine pelvic exam.  She is s/p hysterectomy, but has her ovaries. - Senna 2 tabs BID PRN

## 2015-07-06 ENCOUNTER — Other Ambulatory Visit: Payer: Self-pay | Admitting: Internal Medicine

## 2015-07-08 NOTE — Progress Notes (Signed)
Medicine attending: Medical history, presenting problems, physical findings, and medications, reviewed with resident physician Dr Nicholas Taylor on the day of the patient visit and I concur with his evaluation and management plan. 

## 2015-07-15 ENCOUNTER — Ambulatory Visit: Payer: Medicare Other | Admitting: Sports Medicine

## 2015-07-18 ENCOUNTER — Encounter: Payer: Self-pay | Admitting: *Deleted

## 2015-07-29 ENCOUNTER — Other Ambulatory Visit: Payer: Self-pay | Admitting: Internal Medicine

## 2015-07-30 ENCOUNTER — Other Ambulatory Visit: Payer: Self-pay | Admitting: Internal Medicine

## 2015-07-31 ENCOUNTER — Ambulatory Visit: Payer: Medicare Other | Attending: Oncology | Admitting: Physical Therapy

## 2015-07-31 ENCOUNTER — Encounter: Payer: Self-pay | Admitting: Physical Therapy

## 2015-07-31 DIAGNOSIS — G8111 Spastic hemiplegia affecting right dominant side: Secondary | ICD-10-CM | POA: Diagnosis not present

## 2015-07-31 DIAGNOSIS — R2991 Unspecified symptoms and signs involving the musculoskeletal system: Secondary | ICD-10-CM | POA: Insufficient documentation

## 2015-07-31 DIAGNOSIS — M25661 Stiffness of right knee, not elsewhere classified: Secondary | ICD-10-CM | POA: Diagnosis not present

## 2015-07-31 DIAGNOSIS — R2681 Unsteadiness on feet: Secondary | ICD-10-CM | POA: Insufficient documentation

## 2015-07-31 DIAGNOSIS — M25671 Stiffness of right ankle, not elsewhere classified: Secondary | ICD-10-CM | POA: Diagnosis not present

## 2015-07-31 DIAGNOSIS — R29818 Other symptoms and signs involving the nervous system: Secondary | ICD-10-CM

## 2015-07-31 NOTE — Therapy (Signed)
[facility] [address] [place], [place], [address] Phone: [phone]   Fax:  [phone]  Physical Therapy Evaluation  Patient Details  Name: Wanda [name] MRN: [id number] Date of Birth: 09-22-1955 Referring Provider: Venia Minks, MD (Send PT cert to Levert Feinstein, MD)  Encounter Date: 07/31/2015      PT End of Session - 07/31/15 1018    Visit Number 1   Number of Visits 9  eval + 8 visits   Date for PT Re-Evaluation 09/29/15   Authorization Type Medicare traditional primary; Medicaid secondary - G Codes and PN required every 10 visits   PT Start Time 0807  Pt arrived late to evaluation   PT Stop Time 0846   PT Time Calculation (min) 39 min   Activity Tolerance Patient tolerated treatment well   Behavior During Therapy [facility] for tasks assessed/performed      Past Medical History  Diagnosis Date  . Depression   . GERD (gastroesophageal reflux disease)   . Hypertension   . 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 SLE (systemic lupus erythematosus) (HCC)   . Venous insufficiency (chronic) (peripheral)   . Osteopenia   . ANEMIA-IRON DEFICIENCY 03/02/2006    H&H 8.5/25.9 5/09. On 5/13 12.8/39.7 with MCV 80.4.     Marland Kitchen CEREBROVASCULAR ACCIDENT, HX OF 03/02/2006    Ruptured berry aneurysm, right hemiparesis & expressive aphasia     . SVT (supraventricular tachycardia) (HCC)     a. 03/2013 with mildly elevated troponin at that time.    Past Surgical History  Procedure Laterality Date  . Total abdominal hysterectomy  5/09    Benign cervix, uterine fibroids on bx  . Cerebral aneurysm repair      1990's  . Esophagogastroduodenoscopy  2001    Varices and esophagitis    There were no vitals filed for this visit.  Visit Diagnosis:  Right spastic hemiparesis (HCC) - Plan: PT plan of care cert/re-cert  Poor motor control of trunk - Plan: PT plan of care cert/re-cert  Unsteadiness  - Plan: PT plan of care cert/re-cert  Stiffness of right ankle joint - Plan: PT plan of care cert/re-cert  Stiffness of right knee - Plan: PT plan of care cert/re-cert      Subjective Assessment - 07/31/15 0810    Subjective Subjective/history limited by aphasia. Son, Berna Spare, provided majority of history with verbal agreement from patient.  Pt sustained CVA in 1991 and had since been non-ambulatory. Pt has become less mobile over the past few years. Pt's "joints are stiffening up" and it's painful to move. Berna Spare provides assist for all transfers and for standing to pull up pants in morning. HH Aide currently unable to assist with transfers due to restrictions associated with back injury. Pt received a new manual wheelchair 2 years ago; L brake is now broken. 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  Son would like to increase pt independence with transfers,   Currently in Pain? Yes   Pain Score 7    Pain Location Leg   Pain Orientation Right;Left;Distal  Pain Descriptors / Indicators Aching   Pain Type Chronic pain   Pain Onset More than a month ago   Pain Frequency Intermittent   Aggravating Factors  prolonged sitting   Pain Relieving Factors moving around   Effect of Pain on Daily Activities PT will monitor pain but will not directly address due to nature of referral and chronicity of pain.   Multiple Pain Sites No            [facility] PT Assessment - 07/31/15 0001    Assessment   Medical Diagnosis R hemiparesis due to CVA   Referring Provider Venia Minks, MD  Send PT cert to Levert Feinstein, MD   Onset Date/Surgical  Date 05/18/13  CVA in 1991   Precautions   Precautions Fall;Other (comment)  last Seizure was "a long time ago", per son   Restrictions   Weight Bearing Restrictions No   Balance Screen   Has the patient fallen in the past 6 months No   Has the patient had a decrease in activity level because of a fear of falling?  Yes   Is the patient reluctant to leave their home because of a fear of falling?  Yes   Home Environment   Living Environment Private residence   Living Arrangements Children   Type of Home House   Home Access Ramped entrance  pt requires assist to negotiate ramp   Home Layout One level   Home Equipment Walker - 2 wheels;Tub bench;Bedside commode   Additional Comments HHA, Clarisse Gouge, is with patient 7 hours/day   Prior Function   Level of Independence Needs assistance with ADLs;Needs assistance with homemaking;Needs assistance with gait;Needs assistance with transfers   Cognition   Overall Cognitive Status History of cognitive impairments - at baseline  since CVA in 1991   Observation/Other Assessments   Observations At rest, RLE positioned in R forefoot supination. RUE flexor synergy at rest.    Skin Integrity All visible areas grossly intact. HH aide reports recent "rashes that were really hard to get rid of." Note pt is incontent of bowel and bladder.   Observation/Other Assessments-Edema    Edema --  BLE's; no formal measurement taken   Sensation   Light Touch Appears Intact   Additional Comments Sensation/proprioception not formally assessed due to expressive aphasia; however, observation of functional movement suggests limited RLE proprioception.   Coordination   Gross Motor Movements are Fluid and Coordinated No   Heel Shin Test Unable to perform on RLE due to R hemiplegia, impaired motor control, and spasticity.   Tone   Assessment Location Right Lower Extremity   ROM / Strength   AROM / PROM / Strength AROM;PROM;Strength   AROM   Overall AROM   Deficits;Unable to assess;Other (comment)   Overall AROM Comments Assessment limited by aphasia; however, active R knee extension limited ot grossly -5 degrees. Active R ankle DF limited (less than neutral) by weakness and hypertonia. Active R subtalar eversion limited by R hemiplegia.   PROM   Overall PROM  Deficits;Unable to assess;Other (comment)   Overall PROM Comments Assessment limited by aphasia. However, R knee extension limited to grossly -3 degrees by soft tissue restriction and pain/stiffness. R ankle DF limited to grossly neutral by soft tissue restriction.   Strength   Overall Strength Deficits;Unable to assess;Other (comment)   Overall Strength Comments Assessment of RLE strength limited by aphasia. R hip flexion 3-/5, R knee extension/flexion 4-/5 (within available ROM), R ankle DF and eversion  0/5; R ankle PF 2/5. LLE strength grossly 4/5 in L knee/ankle and 4-/5 in L hip.   Ambulation/Gait   Ambulation/Gait No  Pt has been non-ambulatory since CVA sustained in 1991                   [facility] Treatment/Exercise - 07/31/15 0001    Bed Mobility   Bed Mobility --  NT due to time constraint; however, family assists at home   Transfers   Transfers Squat Pivot Transfers;Stand Pivot Transfers   Stand Pivot Transfers 2: Max assist;1: +2 Total assist  +2A for w/c stabilization due to L brake not present   Stand Pivot Transfer Details (indicate cue type and reason) Stand pivot transfer from w/c > mat table (to L side) with max A of son.   Squat Pivot Transfers 2: Max Development worker, communityassist   Squat Pivot Transfer Details (indicate cue type and reason) Squat pivot transfer from mat table > w.c (to R side) with max A of PT. Hand-over-back technique utilized to promote full anterior weight shift.                PT Education - 07/31/15 0941    Education provided Yes   Education Details PT eval findings, goals, and POC. Explained that PT will focus on transfers, as opposed to  walking, as patient has been non-ambulatory since prior to CVA in 1991.   Person(s) Educated Patient;Caregiver(s);Child(ren)  son, Berna SpareMarcus; [facility], Clarisse GougeBridget   Methods Explanation   Comprehension Verbalized understanding          PT Short Term Goals - 07/31/15 0939    PT SHORT TERM GOAL #1   Title STG's = LTG's           PT Long Term Goals - 07/31/15 0939    PT LONG TERM GOAL #1   Title Pt will effectively perform home stretching program with assist of caregiver (son or [facility] aide) with no cueing from this PT to maximize functional gains made in therapy.   (Target date: 08/28/15)   PT LONG TERM GOAL #2   Title Caregiver will verbalize understanding of frequency/duration of pressure relief and assist pt in effectively performing pressure relief to preserve skin integrity.  (Target date: 08/28/15)   PT LONG TERM GOAL #3   Title Pt will perform supine <> sit with min A, 25% cueing from caregiver to indicate increased independence getting into/out of bed.   (Target date: 08/28/15)   PT LONG TERM GOAL #4   Title Pt will perform supine bridging with supervision and 25% cueing to decrease caregiver burden with scooting and hygiene.   (Target date: 08/28/15)   PT LONG TERM GOAL #5   Title Pt will perform level transfers from w/c <> mat table with min A and 25% cueing to decrease caregiver burden.   (Target date: 08/28/15)   Additional Long Term Goals   Additional Long Term Goals Yes   PT LONG TERM GOAL #6   Title Pt will perform static standing with LUE support at countertop x15 seconds with min guard to increase pt/caregiver safety with LB dressing.   (Target date: 08/28/15)   PT LONG TERM GOAL #7   Title Assess current R AFO for appropriate fit and make recommendations to maximize pt safety with transfers, maintain joint positioning/alignment.  (Target date: 08/28/15)               Plan - 07/31/15 1024    Clinical Impression Statement  Pt is a 60 y/o F referred to outpatient neuro PT to  address functional impairments associated with R hemiparesis due to CVA sustained in 1991. PMH significant for: depression, SLE, asthma, SVT, and BLE edema. PT evaluation reveals the following impairments: R spastic hemiplegia, stiffness of R knee/ankle joint; impaired trunk control; poor seated postural alignment; poor R ankle alignment, placing pt at risk of further contracture, injury (during transfers) and skin breakdown; decreased stability/independence with funcitonal transfers and standing balance. Pt will benefit from skilled outpatient PT 2x/week for 4 weeks to address said impairments.   Pt will benefit from skilled therapeutic intervention in order to improve on the following deficits Postural dysfunction;Impaired tone;Cardiopulmonary status limiting activity;Increased edema;Decreased balance;Decreased activity tolerance;Decreased endurance;Decreased skin integrity;Impaired sensation;Decreased strength;Impaired flexibility;Impaired UE functional use;Decreased mobility;Decreased coordination   Rehab Potential Fair   Clinical Impairments Affecting Rehab Potential time since CVA (1991)   PT Frequency 2x / week   PT Duration 4 weeks   PT Treatment/Interventions ADLs/Self Care Home Management;Functional mobility training;Therapeutic activities;Therapeutic exercise;Balance training;DME Instruction;Neuromuscular re-education;Cognitive remediation;Patient/family education;Orthotic Fit/Training   PT Next Visit Plan If caregivers bring in R AFO, see if still appropriate, find out why pt not wearing. If still appropriate, educate pt/family on rationale for AFO for positioning and transfer safety. Initiate home stretching program for spasticity management. Trial sit > stand, static standing at // bars. Contact [facility] about L w/c brake being broken.   Consulted and Agree with Plan of Care Patient;Family member/caregiver   Family Member Consulted son, Berna Spare;  [facility] Lacretia Leigh          G-Codes  - 2015-08-08 1051    Functional Assessment Tool Used Max A for level transfer from w/c <> mat table   Functional Limitation Changing and maintaining body position   Changing and Maintaining Body Position Current Status [phone]) At least 60 percent but less than 80 percent impaired, limited or restricted   Changing and Maintaining Body Position Goal Status (U0454) At least 40 percent but less than 60 percent impaired, limited or restricted   Changing and Maintaining Body Position Discharge Status (U9811) --       Problem List Patient Active Problem List   Diagnosis Date Noted  . Constipation 07/05/2015  . Neck muscle strain 05/09/2015  . Diastolic dysfunction 08/31/2013  . Right hemiparesis (HCC) 07/18/2013  . SVT (supraventricular tachycardia) (HCC) 03/18/2013  . Need for prophylactic vaccination with combined diphtheria-tetanus-pertussis (DTP) vaccine 09/21/2012  . Seasonal allergies 09/21/2012  . Osteoporosis, unspecified 09/21/2012  . Routine adult health maintenance 11/27/2010  . INSOMNIA 09/19/2009  . Bilateral lower extremity edema 12/25/2008  . Asthma 07/13/2006  . ANEMIA-IRON DEFICIENCY 03/02/2006  . DEPRESSION 03/02/2006  . Essential hypertension 03/02/2006  . GERD 03/02/2006  . SEIZURE DISORDER 03/02/2006  . CVA (cerebral infarction) 03/02/2006    Jorje Guild, PT, DPT [facility] [address] [place], [place], [address] Phone: [phone]   Fax:  [phone] 08/08/2015, 10:53 AM   Name: Wanda [name] MRN: [id number] Date of Birth: 1955-12-04

## 2015-08-02 DIAGNOSIS — H527 Unspecified disorder of refraction: Secondary | ICD-10-CM | POA: Diagnosis not present

## 2015-08-02 DIAGNOSIS — H53469 Homonymous bilateral field defects, unspecified side: Secondary | ICD-10-CM | POA: Diagnosis not present

## 2015-08-02 DIAGNOSIS — H25813 Combined forms of age-related cataract, bilateral: Secondary | ICD-10-CM | POA: Diagnosis not present

## 2015-08-02 DIAGNOSIS — I69398 Other sequelae of cerebral infarction: Secondary | ICD-10-CM | POA: Diagnosis not present

## 2015-08-05 ENCOUNTER — Ambulatory Visit (INDEPENDENT_AMBULATORY_CARE_PROVIDER_SITE_OTHER): Payer: Medicare Other | Admitting: Sports Medicine

## 2015-08-05 ENCOUNTER — Encounter: Payer: Self-pay | Admitting: Sports Medicine

## 2015-08-05 DIAGNOSIS — I639 Cerebral infarction, unspecified: Secondary | ICD-10-CM | POA: Diagnosis not present

## 2015-08-05 DIAGNOSIS — B351 Tinea unguium: Secondary | ICD-10-CM | POA: Diagnosis not present

## 2015-08-05 DIAGNOSIS — M79671 Pain in right foot: Secondary | ICD-10-CM | POA: Diagnosis not present

## 2015-08-05 DIAGNOSIS — M79672 Pain in left foot: Secondary | ICD-10-CM | POA: Diagnosis not present

## 2015-08-05 DIAGNOSIS — Z8673 Personal history of transient ischemic attack (TIA), and cerebral infarction without residual deficits: Secondary | ICD-10-CM

## 2015-08-05 NOTE — Progress Notes (Signed)
Patient ID: Wanda [name], female   DOB: 01/20/1956, 60 y.o.   MRN: [id number] Subjective: Wanda PrimesShirley A [name] is a 60 y.o. female patient seen today in office with complaint of painful thickened and elongated toenails; unable to trim. Patient denies history of Diabetes or Neuropathy. Patient is assisted by son who states that his mother has no other pedal complaints at this time.   Patient Active Problem List   Diagnosis Date Noted  . Constipation 07/05/2015  . Neck muscle strain 05/09/2015  . Diastolic dysfunction 08/31/2013  . Right hemiparesis (HCC) 07/18/2013  . SVT (supraventricular tachycardia) (HCC) 03/18/2013  . Need for prophylactic vaccination with combined diphtheria-tetanus-pertussis (DTP) vaccine 09/21/2012  . Seasonal allergies 09/21/2012  . Osteoporosis, unspecified 09/21/2012  . Routine adult health maintenance 11/27/2010  . INSOMNIA 09/19/2009  . Bilateral lower extremity edema 12/25/2008  . Asthma 07/13/2006  . ANEMIA-IRON DEFICIENCY 03/02/2006  . DEPRESSION 03/02/2006  . Essential hypertension 03/02/2006  . GERD 03/02/2006  . SEIZURE DISORDER 03/02/2006  . CVA (cerebral infarction) 03/02/2006    Current Outpatient Prescriptions on File Prior to Visit  Medication Sig Dispense Refill  . albuterol (PROVENTIL) (2.5 MG/3ML) 0.083% nebulizer solution Take 3 mLs (2.5 mg total) by nebulization every 6 (six) hours as needed for wheezing or shortness of breath. 75 mL 12  . aspirin 81 MG tablet Take 81 mg by mouth daily.      Marland Kitchen. atorvastatin (LIPITOR) 40 MG tablet Take 1 tablet (40 mg total) by mouth daily. 30 tablet 3  . calcium-vitamin D (OSCAL) 250-125 MG-UNIT per tablet Take 1 tablet by mouth daily.      . Cholecalciferol (REPLESTA) 50000 UNITS WAFR Take 50,000 Units by mouth every 7 (seven) days. On wednesdays    . cyclobenzaprine (FLEXERIL) 5 MG tablet TAKE 1 TABLET BY MOUTH AT BEDTIME AS NEEDED FOR MUSCLE SPASMS 30 tablet 3  . denosumab (PROLIA) 60 MG/ML SOLN injection  Inject 60 mg into the skin every 6 (six) months. Administer in upper arm, thigh, or abdomen  0  . gabapentin (NEURONTIN) 300 MG capsule Take 1 capsule (300 mg total) by mouth 3 (three) times daily. 270 capsule 3  . hydrochlorothiazide (HYDRODIURIL) 25 MG tablet Take 1 tablet (25 mg total) by mouth daily. 90 tablet 3  . HYDROcodone-acetaminophen (NORCO/VICODIN) 5-325 MG per tablet Take 1 tablet by mouth every 6 (six) hours as needed for moderate pain. 60 tablet 0  . ipratropium (ATROVENT) 0.02 % nebulizer solution Take 2.5 mLs (0.5 mg total) by nebulization every 4 (four) hours as needed for wheezing or shortness of breath. 62.5 mL 2  . levETIRAcetam (KEPPRA) 500 MG tablet Take 1 tablet (500 mg total) by mouth 2 (two) times daily. 180 tablet 3  . lisinopril (PRINIVIL,ZESTRIL) 10 MG tablet Take 1 tablet (10 mg total) by mouth daily. 30 tablet 3  . loratadine (CLARITIN) 10 MG tablet TAKE 1 TABLET (10 MG TOTAL) BY MOUTH DAILY. 30 tablet 6  . metoCLOPramide (REGLAN) 5 MG tablet Take 1 tablet (5 mg total) by mouth every 6 (six) hours as needed for nausea. 360 tablet 0  . metoprolol (LOPRESSOR) 50 MG tablet Take 1.5 tablets (75 mg total) by mouth 2 (two) times daily. 270 tablet 3  . Multiple Vitamins-Minerals (ADULT ONE DAILY GUMMIES) CHEW Chew 2 each by mouth daily.    Marland Kitchen. omeprazole (PRILOSEC) 20 MG capsule Take 1 capsule (20 mg total) by mouth daily. 90 capsule 0  . oxyCODONE-acetaminophen (PERCOCET) 5-325 MG tablet Take 1  tablet by mouth every 6 (six) hours as needed for severe pain. 6 tablet 0  . polyethylene glycol (MIRALAX / GLYCOLAX) packet Take 17 g by mouth daily as needed. 100 each 0  . potassium chloride (K-DUR) 10 MEQ tablet Take 1 tablet (10 mEq total) by mouth 2 (two) times daily. 180 tablet 3  . promethazine (PHENERGAN) 12.5 MG tablet Take 1 tablet (12.5 mg total) by mouth every 8 (eight) hours as needed for nausea. 45 tablet 2  . senna-docusate (SENOKOT-S) 8.6-50 MG tablet Take 2 tablets by  mouth 2 (two) times daily. 120 tablet 3  . sulfamethoxazole-trimethoprim (BACTRIM DS,SEPTRA DS) 800-160 MG tablet Take 1 tablet by mouth 2 (two) times daily. 14 tablet 0  . venlafaxine XR (EFFEXOR-XR) 75 MG 24 hr capsule Take 1 capsule (75 mg total) by mouth daily with breakfast. 90 capsule 3   No current facility-administered medications on file prior to visit.    Allergies  Allergen Reactions  . Lasix [Furosemide] Swelling    mild L facial swelling  . Penicillins Swelling  . Sulfur Itching    Objective: Physical Exam  General: Well developed, nourished, no acute distress, awake, alert and oriented x 3  Vascular: Dorsalis pedis artery 0/4 bilateral, Posterior tibial artery 1/4 bilateral, skin temperature warm to warm proximal to distal bilateral lower extremities, no varicosities, no pedal hair present bilateral. Trace edema right>left.  Neurological: Gross sensation present via light touch bilateral. Patient is hyper-sensitive bilateral.   Dermatological: Skin is warm, dry, and supple bilateral, Nails 1-10 are tender, long, thick, and discolored with moderate subungal debris, no webspace macerations present bilateral, no open lesions present bilateral, no callus/corns/hyperkeratotic tissue present bilateral. No signs of infection bilateral.  Musculoskeletal:Bunion and hammertoe boney deformities noted bilateral. Muscular strength within normal limits without pain on range of motion on left, 4/5 on right. No pain with calf compression bilateral.  Assessment and Plan:  Problem List Items Addressed This Visit    None    Visit Diagnoses    Dermatophytosis of nail    -  Primary    Foot pain, bilateral        History of stroke        Residual right sided weakness      -Examined patient.  -Discussed treatment options for painful mycotic nails. -Mechanically debrided and reduced mycotic nails with sterile nail nipper and dremel nail file without incident. -Patient to return in 3  months for follow up evaluation or sooner if symptoms worsen.  Asencion Islam, DPM

## 2015-08-07 ENCOUNTER — Ambulatory Visit: Payer: Medicare Other | Admitting: Physical Therapy

## 2015-08-07 DIAGNOSIS — M25671 Stiffness of right ankle, not elsewhere classified: Secondary | ICD-10-CM

## 2015-08-07 DIAGNOSIS — R2681 Unsteadiness on feet: Secondary | ICD-10-CM | POA: Diagnosis not present

## 2015-08-07 DIAGNOSIS — G8111 Spastic hemiplegia affecting right dominant side: Secondary | ICD-10-CM

## 2015-08-07 DIAGNOSIS — R2991 Unspecified symptoms and signs involving the musculoskeletal system: Secondary | ICD-10-CM | POA: Diagnosis not present

## 2015-08-07 DIAGNOSIS — M25661 Stiffness of right knee, not elsewhere classified: Secondary | ICD-10-CM

## 2015-08-07 DIAGNOSIS — R29818 Other symptoms and signs involving the nervous system: Secondary | ICD-10-CM

## 2015-08-07 NOTE — Patient Instructions (Signed)
Scooting    Wanda [name] should perform this exercise sitting on the edge of her bed with feet touching the floor with caregiver sitting in front of her at all times. Cue Shirly to "walk" forward to front by shifting weight from buttock to buttock. Repeat going backward. Make sure Wanda [name] is not shearing skin but is instead shifting weight off hip prior to scooting.   1. Repeat __5__ times per session. Do __3__ sessions per day, every day. 2. Use this scooting technique to increase Wanda [name]/s independence with setting up transfers (for example, scooting to edge of wheelchair seat).  Dorsiflexion: Stretch - Heel Cord / Gastrocnemius    Position (A) Patient: Lie or sit with knee straight. Helper: Cup RIGHT heel. Make sure grip is firm. Motion (B) - Helper uses forearm to apply pressure to entire sole of foot, stretching foot toward shin. CAUTION: Stretch should be felt in calf. Do not allow foot to twist. Hold _60_ seconds. Repeat __3_ times on the RIGHT leg.  Abduction / External Rotation: ROM (Supine)    Position (A) Patient: Bend RIGHT leg, foot flat on bed. Helper: Stabilize hip. Place other hand on knee. Motion (B) -Move knee out to side, pelvis remains flat on bed. -Stop at point of tension in inner thigh OR before discomfort. Hold for 30 seconds, as tolerated. Repeat _4-6_ times per day on the RIGHT leg.   Here are some things we talked about today: - Try to bring the right ankle brace to the next session. Wanda [name] will take a look at it and make sure it's still appropriate. - If the brace is uncomfortable, the orthotist should be able to modify it. - Avoid having Wanda [name] lie flat on her back while she is sick. This made it difficult for her to breathe today. Try to elevate the head of her hospital bed to at least 30-45 degrees while she's lying down.

## 2015-08-07 NOTE — Therapy (Signed)
[facility] [address] [place], [place], [address] Phone: [phone]   Fax:  [phone]  Physical Therapy Treatment  Patient Details  Name: Wanda [name] MRN: [id number] Date of Birth: 11/30/1955 Referring Provider: Venia MinksNicholas Taylor, MD (Send Wanda [name] cert to Levert FeinsteinJames M Granfortuna, MD)  Encounter Date: 08/07/2015      Wanda [name] End of Session - 08/07/15 1134    Visit Number 2   Number of Visits 9   Date for Wanda [name] Re-Evaluation 09/29/15   Authorization Type Medicare traditional primary; Medicaid secondary - G Codes and PN required every 10 visits   Wanda [name] Start Time 1017   Wanda [name] Stop Time 1111   Wanda [name] Time Calculation (min) 54 min   Equipment Utilized During Treatment Other (comment)  ACE bandage at R ankle for stability/positioning   Activity Tolerance Other (comment)  Frequent rest breaks due to Wanda [name] having bad cold today   Behavior [facility] for tasks assessed/performed      Past Medical History  Diagnosis Date  . Depression   . GERD (gastroesophageal reflux disease)   . Hypertension   . 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 SLE (systemic lupus erythematosus) (HCC)   . Venous insufficiency (chronic) (peripheral)   . Osteopenia   . ANEMIA-IRON DEFICIENCY 03/02/2006    H&H 8.5/25.9 5/09. On 5/13 12.8/39.7 with MCV 80.4.     Marland Kitchen. CEREBROVASCULAR ACCIDENT, HX OF 03/02/2006    Ruptured berry aneurysm, right hemiparesis & expressive aphasia     . SVT (supraventricular tachycardia) (HCC)     a. 03/2013 with mildly elevated troponin at that time.    Past Surgical History  Procedure Laterality Date  . Total abdominal hysterectomy  5/09    Benign cervix, uterine fibroids on bx  . Cerebral aneurysm repair      1990's  . Esophagogastroduodenoscopy  2001    Varices and esophagitis    There were no vitals filed for this visit.  Visit Diagnosis:  Right spastic hemiparesis (HCC)  Poor motor control of  trunk  Unsteadiness  Stiffness of right ankle joint  Stiffness of right knee      Subjective Assessment - 08/07/15 1021    Subjective Wanda [name] reports no falls and no significant changes.  HH aide, Wanda GougeBridget, mentioned possibility of mechanical lift to increase safety with transfers at home. Son, Wanda [name], did forget to bring AFO to this session; plans to bring to next session. Wanda [name] reports Wanda [name] no longer wears AFO due to discomfort.   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  Son would like to increase Wanda [name] independence with transfers,   Currently in Pain? No/denies  no pain at rest 4/10 in R foot/ankle (per FLACC scale) during transfers                         [facility] Wanda [name] Treatment/Exercise - 08/07/15 0001    Transfers   Transfers Stand Pivot Transfers;Squat Pivot Transfers;Supine to Sit;Sit to Supine   Squat Pivot Transfers 3: Mod assist;2: Max assist   Squat Pivot Transfer Details (indicate cue type and reason) Level transfer from w/c <> mat table; wedge behing L wheel due to L w/c brake dysfunction.  Required mod A to L side, Max A to L side. Max multimodal cueing focused on full anterior weight shift. Cueing for foot placement with inconsistent within-session carryover.   Supine to Sit 3: Mod assist   Supine to Sit Details (indicate cue type and reason) Verbal and demo cueing for setup, RLE management using LLE with effective return demo; however, Wanda [name] required mod A for BLE management.   Sit to Supine 1: +1 Total assist   Sit to Supine Details  Upon getting into supine position, Wanda [name] noted Wanda [name] exhibiting signs of difficulty breathing (due to Wanda [name] having cold at this time). Therefore, Wanda [name] provided Total A for transition from supine > sit to  enable Wanda [name] to breathe easily. Upon getting into seated position, Wanda [name] in no apparent distress and reports she is ready to continue treatment.   Comments During all anterior weight shifts, scooting, and partial stands, ACE bandage on R ankle to promote R ankle DF/eversion to prevent injury.   Ambulation/Gait   Ambulation/Gait No  Wanda [name] has been non-ambulatory since CVA sustained in 1991   Self-Care   Self-Care Other Self-Care Comments   Other Self-Care Comments  During seated rest breaks, provided education on the following: recommended to maintain Wanda [name]'s hospital bed elevated > 30 degrees and to observe closely for any signs of labored breathing or distress due to difficulty breathing in supine position today; advised that son and aide take patient to PCP if cough continues to avoid PNA; requesting son bring R AFO to next session so this Wanda [name] will be able to sasess if still appropriate. Educated Wanda [name]/family that orthotist should be able to modify brace based on areas of increased pressure or discomfort.   Neuro Re-ed    Neuro Re-ed Details  Seated EOM, Wanda [name] performed LUE anterolateral reaching to initiate full anterior weight shift to increase Wanda [name] independence with scooting and functional transfers. Multimodal cueing focused on upright posture (thoracic spine extension); tactile cueing at R knee for increaed WB/proprioception and to maintain position with onset of RLE spasticity (causing R hip internal rotation). Wanda [name] did exhibit effective within-session carrryover of tsetup for scooting/transfers. Explained and demonstrated seated prolonged passive stretch of R ankle plantarflexors x1 minute and R hip internal rotators 2 x30-sec holds (to Wanda [name] tolerance) for improved positioning and to maintain functional muscle length. Blocked practice of reciprocal scooting A/P on mat table; Wanda [name] required reinforcement to use reciprocal scooting functionalls (to reposition self in w/c) for remainder of session.                Wanda [name]  Education - 08/07/15 1117    Education provided Yes   Education Details Discussed possible mechanical lifts; specifically, focused on STEDY lift to maximize Wanda [name] independence while decreasing caregiver burden. Provider paper handout on specific lift. HEP iniitiated; see Wanda [name] Instructions for details. See Self Care for details on additional education provided.   Person(s) Educated Patient;Child(ren);Caregiver(s)  HH aide, Wanda [name]   Methods Explanation;Demonstration;Tactile cues;Verbal cues;Handout   Comprehension Verbalized understanding          Wanda [name] Short Term Goals - 07/31/15 0939    Wanda [name] SHORT TERM GOAL #1   Title STG's = LTG's           Wanda [name] Long Term Goals - 07/31/15 0939    Wanda [name] LONG TERM GOAL #1   Title Wanda [name] will effectively perform home stretching program with assist of caregiver (son or [facility] aide) with no cueing from this Wanda [name] to maximize functional gains made in therapy.   (  Target date: 08/28/15)   Wanda [name] LONG TERM GOAL #2   Title Caregiver will verbalize understanding of frequency/duration of pressure relief and assist Wanda [name] in effectively performing pressure relief to preserve skin integrity.  (Target date: 08/28/15)   Wanda [name] LONG TERM GOAL #3   Title Wanda [name] will perform supine <> sit with min A, 25% cueing from caregiver to indicate increased independence getting into/out of bed.   (Target date: 08/28/15)   Wanda [name] LONG TERM GOAL #4   Title Wanda [name] will perform supine bridging with supervision and 25% cueing to decrease caregiver burden with scooting and hygiene.   (Target date: 08/28/15)   Wanda [name] LONG TERM GOAL #5   Title Wanda [name] will perform level transfers from w/c <> mat table with min A and 25% cueing to decrease caregiver burden.   (Target date: 08/28/15)   Additional Long Term Goals   Additional Long Term Goals Yes   Wanda [name] LONG TERM GOAL #6   Title Wanda [name] will perform static standing with LUE support at countertop x15 seconds with min guard to increase Wanda [name]/caregiver safety with LB dressing.   (Target date: 08/28/15)   Wanda [name] LONG  TERM GOAL #7   Title Assess current R AFO for appropriate fit and make recommendations to maximize Wanda [name] safety with transfers, maintain joint positioning/alignment.  (Target date: 08/28/15)               Plan - 08/07/15 1135    Clinical Impression Statement Session focused on initiation of home stretching program and maximizing Wanda [name] independence with functional transfers and bed mobility. Independence with seated scooting and transfers limited by Wanda [name] difficulty with/fear of performing full anterior weight shift. RLE spasticty (R knee flexion and hip internal rotation) also intefering with position and causing pain during scooting and transfers.   Wanda [name] will benefit from skilled therapeutic intervention in order to improve on the following deficits Postural dysfunction;Impaired tone;Cardiopulmonary status limiting activity;Increased edema;Decreased balance;Decreased activity tolerance;Decreased endurance;Decreased skin integrity;Impaired sensation;Decreased strength;Impaired flexibility;Impaired UE functional use;Decreased mobility;Decreased coordination   Rehab Potential Fair   Clinical Impairments Affecting Rehab Potential time since CVA (1991)   Wanda [name] Frequency 2x / week   Wanda [name] Duration 4 weeks   Wanda [name] Treatment/Interventions ADLs/Self Care Home Management;Functional mobility training;Therapeutic activities;Therapeutic exercise;Balance training;DME Instruction;Neuromuscular re-education;Cognitive remediation;Patient/family education;Orthotic Fit/Training   Wanda [name] Next Visit Plan If caregivers bring in R AFO, trial AFO during session to see if Wanda [name] comfortable and if still appropriate. Reiterated education on seeing orthotist about areas of pressure/discomfort. Assess compliance with home stretching program for spasticity management. If Wanda [name] no longer has cold, revisit supine <> sit. Trial sit > stand, static standing at // bars.    Consulted and Agree with Plan of Care Patient;Family member/caregiver   Family Member  Consulted son, Wanda Spare;  [facility] Aide, Wanda [name]        Problem List Patient Active Problem List   Diagnosis Date Noted  . Constipation 07/05/2015  . Neck muscle strain 05/09/2015  . Diastolic dysfunction 08/31/2013  . Right hemiparesis (HCC) 07/18/2013  . SVT (supraventricular tachycardia) (HCC) 03/18/2013  . Need for prophylactic vaccination with combined diphtheria-tetanus-pertussis (DTP) vaccine 09/21/2012  . Seasonal allergies 09/21/2012  . Osteoporosis, unspecified 09/21/2012  . Routine adult health maintenance 11/27/2010  . INSOMNIA 09/19/2009  . Bilateral lower extremity edema 12/25/2008  . Asthma 07/13/2006  . ANEMIA-IRON DEFICIENCY 03/02/2006  . DEPRESSION 03/02/2006  . Essential hypertension 03/02/2006  . GERD 03/02/2006  . SEIZURE DISORDER 03/02/2006  . CVA (cerebral infarction) 03/02/2006   Wanda [name], Wanda [name],  Wanda [name] [facility] [address] [place], [place], [address] Phone: [phone]   Fax:  [phone] 08/07/2015, 11:40 AM   Name: ALANTRA POPOCA MRN: [id number] Date of Birth: 07/09/1955

## 2015-08-08 ENCOUNTER — Telehealth: Payer: Self-pay | Admitting: Internal Medicine

## 2015-08-08 NOTE — Telephone Encounter (Signed)
Wanda [name] AT [facility] WOULD LIKE TO HELP THE PATIENT AGAIN WITH HER SPEECH SINCE THE SPEECH DEVICE HAS BEEN APPROVED

## 2015-08-08 NOTE — Telephone Encounter (Signed)
CALLED ABOUT EQUIPMENT FOR SPEECH

## 2015-08-09 ENCOUNTER — Ambulatory Visit: Payer: Medicare Other | Admitting: Physical Therapy

## 2015-08-09 ENCOUNTER — Telehealth: Payer: Self-pay | Admitting: Physical Therapy

## 2015-08-09 NOTE — Telephone Encounter (Signed)
Speech therapist is allison, gave VO to continue speech therapy, do you agree?

## 2015-08-09 NOTE — Telephone Encounter (Signed)
Called to check on patient due to missed PT appointment today at 10:15 am. Spoke with son, Berna SpareMarcus, who reported pt was unable to make it to therapy due to cold. Notified Berna SpareMarcus of next appointment date time. Berna SpareMarcus states Ms. Boehme plans to be present.  Berna SpareMarcus did speak with Melrose NakayamaJoshua Cadle, [employer] this morning about getting patient a custom wheelchair. Patient and son in agreement with this plan.  Jorje GuildBlair Hobble, PT, DPT [facility] [address] [place], [place], [address] Phone: [phone]   Fax:  [phone] 08/09/2015, 11:02 AM

## 2015-08-12 ENCOUNTER — Ambulatory Visit: Payer: Medicare Other | Admitting: Physical Therapy

## 2015-08-14 NOTE — Telephone Encounter (Signed)
I agree to continue Speech Therapy.

## 2015-08-15 ENCOUNTER — Ambulatory Visit: Payer: Medicare Other | Admitting: Physical Therapy

## 2015-08-15 ENCOUNTER — Telehealth: Payer: Self-pay | Admitting: Physical Therapy

## 2015-08-15 NOTE — Telephone Encounter (Signed)
Attempted to contact patient/caregiver due to missed PT appt this morning at 10:15 am and to notify patient of next PT appt time/date. No answer and unable to leave voicemail due to mailbox full.   Jorje GuildBlair Jaziah Kwasnik, PT, DPT [facility] [address] [place], [place], [address] Phone: [phone]   Fax:  [phone] 08/15/2015, 10:29 AM

## 2015-08-16 NOTE — Telephone Encounter (Signed)
Rec'd call from speech therapist, they need brand new written order to start speech therapy again faxed to [phone].  They have rec'd communication device and need to see patient for training and customization.  They are unable to go back into patients chart unless they receive new order.  Shana?

## 2015-08-19 NOTE — Telephone Encounter (Signed)
Pt had an appointment on 07/05/15, which [facility]  However, I do see an order for outpatient PT, although most visits have been canceled.  [name] would be unable to have both outpatient therapy and home health ST.

## 2015-08-20 ENCOUNTER — Other Ambulatory Visit: Payer: Self-pay | Admitting: Internal Medicine

## 2015-08-20 DIAGNOSIS — I639 Cerebral infarction, unspecified: Secondary | ICD-10-CM

## 2015-08-21 ENCOUNTER — Ambulatory Visit: Payer: Medicare Other | Admitting: Physical Therapy

## 2015-08-21 ENCOUNTER — Telehealth: Payer: Self-pay | Admitting: Licensed Clinical Social Worker

## 2015-08-21 NOTE — Addendum Note (Signed)
Addended by: Venia MinksAYLOR, Julieana Eshleman A on: 08/21/2015 07:17 AM   Modules accepted: Orders

## 2015-08-21 NOTE — Telephone Encounter (Signed)
[facility] ST order as pt's communication device has been approved.  [facility].  CSW attempted to contact [name] to notify of new referral unable to leave message, voice mailbox full.  Letter mailed.

## 2015-08-21 NOTE — Telephone Encounter (Signed)
HH ST [facility]

## 2015-08-23 ENCOUNTER — Ambulatory Visit: Payer: Medicare Other | Admitting: Physical Therapy

## 2015-08-26 ENCOUNTER — Ambulatory Visit: Payer: Medicare Other | Admitting: Physical Therapy

## 2015-08-28 ENCOUNTER — Ambulatory Visit: Payer: Medicare Other | Admitting: Physical Therapy

## 2015-08-29 DIAGNOSIS — I872 Venous insufficiency (chronic) (peripheral): Secondary | ICD-10-CM | POA: Diagnosis not present

## 2015-08-29 DIAGNOSIS — F329 Major depressive disorder, single episode, unspecified: Secondary | ICD-10-CM | POA: Diagnosis not present

## 2015-08-29 DIAGNOSIS — I10 Essential (primary) hypertension: Secondary | ICD-10-CM | POA: Diagnosis not present

## 2015-08-29 DIAGNOSIS — D509 Iron deficiency anemia, unspecified: Secondary | ICD-10-CM | POA: Diagnosis not present

## 2015-08-29 DIAGNOSIS — I69320 Aphasia following cerebral infarction: Secondary | ICD-10-CM | POA: Diagnosis not present

## 2015-08-29 DIAGNOSIS — I69351 Hemiplegia and hemiparesis following cerebral infarction affecting right dominant side: Secondary | ICD-10-CM | POA: Diagnosis not present

## 2015-09-02 ENCOUNTER — Ambulatory Visit: Payer: Medicare Other | Admitting: Physical Therapy

## 2015-09-04 DIAGNOSIS — D509 Iron deficiency anemia, unspecified: Secondary | ICD-10-CM | POA: Diagnosis not present

## 2015-09-04 DIAGNOSIS — I10 Essential (primary) hypertension: Secondary | ICD-10-CM | POA: Diagnosis not present

## 2015-09-04 DIAGNOSIS — I69351 Hemiplegia and hemiparesis following cerebral infarction affecting right dominant side: Secondary | ICD-10-CM | POA: Diagnosis not present

## 2015-09-04 DIAGNOSIS — I872 Venous insufficiency (chronic) (peripheral): Secondary | ICD-10-CM | POA: Diagnosis not present

## 2015-09-04 DIAGNOSIS — I69320 Aphasia following cerebral infarction: Secondary | ICD-10-CM | POA: Diagnosis not present

## 2015-09-04 DIAGNOSIS — F329 Major depressive disorder, single episode, unspecified: Secondary | ICD-10-CM | POA: Diagnosis not present

## 2015-09-05 ENCOUNTER — Ambulatory Visit: Payer: Medicare Other | Admitting: Physical Therapy

## 2015-09-05 ENCOUNTER — Ambulatory Visit: Payer: Medicare Other | Admitting: Internal Medicine

## 2015-09-05 ENCOUNTER — Encounter: Payer: Self-pay | Admitting: Internal Medicine

## 2015-09-11 NOTE — Telephone Encounter (Signed)
CSW [facility] [place] at [phone] to [facility].  [place] confirmed [facility] will be able to accept if they have not already and will initiate services.

## 2015-09-12 ENCOUNTER — Telehealth: Payer: Self-pay | Admitting: *Deleted

## 2015-09-12 ENCOUNTER — Encounter: Payer: Self-pay | Admitting: Internal Medicine

## 2015-09-12 ENCOUNTER — Ambulatory Visit (INDEPENDENT_AMBULATORY_CARE_PROVIDER_SITE_OTHER): Payer: Medicare Other | Admitting: Internal Medicine

## 2015-09-12 ENCOUNTER — Other Ambulatory Visit (HOSPITAL_COMMUNITY)
Admission: RE | Admit: 2015-09-12 | Discharge: 2015-09-12 | Disposition: A | Discharge status: Home / Self Care | Payer: Medicare Other | Source: Ambulatory Visit | Attending: Internal Medicine | Admitting: Internal Medicine

## 2015-09-12 VITALS — BP 137/84 | HR 79 | Temp 97.9°F

## 2015-09-12 DIAGNOSIS — R5381 Other malaise: Secondary | ICD-10-CM | POA: Diagnosis not present

## 2015-09-12 DIAGNOSIS — R5383 Other fatigue: Secondary | ICD-10-CM

## 2015-09-12 DIAGNOSIS — I872 Venous insufficiency (chronic) (peripheral): Secondary | ICD-10-CM | POA: Diagnosis not present

## 2015-09-12 DIAGNOSIS — I10 Essential (primary) hypertension: Secondary | ICD-10-CM | POA: Diagnosis not present

## 2015-09-12 DIAGNOSIS — F329 Major depressive disorder, single episode, unspecified: Secondary | ICD-10-CM | POA: Diagnosis not present

## 2015-09-12 DIAGNOSIS — I69320 Aphasia following cerebral infarction: Secondary | ICD-10-CM | POA: Diagnosis not present

## 2015-09-12 DIAGNOSIS — L299 Pruritus, unspecified: Secondary | ICD-10-CM | POA: Diagnosis not present

## 2015-09-12 DIAGNOSIS — R21 Rash and other nonspecific skin eruption: Secondary | ICD-10-CM

## 2015-09-12 DIAGNOSIS — L308 Other specified dermatitis: Secondary | ICD-10-CM | POA: Diagnosis not present

## 2015-09-12 DIAGNOSIS — D509 Iron deficiency anemia, unspecified: Secondary | ICD-10-CM | POA: Diagnosis not present

## 2015-09-12 DIAGNOSIS — I69351 Hemiplegia and hemiparesis following cerebral infarction affecting right dominant side: Secondary | ICD-10-CM | POA: Diagnosis not present

## 2015-09-12 NOTE — Patient Instructions (Signed)
Thank you for your visit today.   Please return to the internal medicine clinic in about 3-4 weeks  or sooner if needed.     I have made the following additions/changes to your medications:  Continue your current meds.  We will let you know the results of your biopsy.   Please be sure to bring all of your medications with you to every visit; this includes herbal supplements, vitamins, eye drops, and any over-the-counter medications.   Should you have any questions regarding your medications and/or any new or worsening symptoms, please be sure to call the clinic at [phone].   If you believe that you are suffering from a life threatening condition or one that may result in the loss of limb or function, then you should call 911 and proceed to the nearest Emergency Department.   A healthy lifestyle and preventative care can promote health and wellness.   Maintain regular health, dental, and eye exams.  Eat a healthy diet. Foods like vegetables, fruits, whole grains, low-fat dairy products, and lean protein foods contain the nutrients you need without too many calories. Decrease your intake of foods high in solid fats, added sugars, and salt. Get information about a proper diet from your caregiver, if necessary.  Regular physical exercise is one of the most important things you can do for your health. Most adults should get at least 150 minutes of moderate-intensity exercise (any activity that increases your heart rate and causes you to sweat) each week. In addition, most adults need muscle-strengthening exercises on 2 or more days a week.   Maintain a healthy weight. The body mass index (BMI) is a screening tool to identify possible weight problems. It provides an estimate of body fat based on height and weight. Your caregiver can help determine your BMI, and can help you achieve or maintain a healthy weight. For adults 20 years and older:  A BMI below 18.5 is considered  underweight.  A BMI of 18.5 to 24.9 is normal.  A BMI of 25 to 29.9 is considered overweight.  A BMI of 30 and above is considered obese.

## 2015-09-12 NOTE — Progress Notes (Signed)
Patient ID: Wanda [name], female   DOB: 1955/08/15, 60 y.o.   MRN: [id number]      Subjective:   Patient ID: Wanda [name] female    DOB: December 06, 1955 60 y.o.    MRN: [id number] Health Maintenance Due: Health Maintenance Due  Topic Date Due  . Hepatitis C Screening  04-14-1956  . HIV Screening  02/16/1973  . MAMMOGRAM  06/09/2015    _________________________________________________  HPI: [name] is a 60 y.o. female here for an acute rash.  Pt has a PMH outlined below.  Please see problem-based charting assessment and plan for further status of patient's chronic medical problems addressed at today's visit.  PMH: Past Medical History  Diagnosis Date  . Depression   . GERD (gastroesophageal reflux disease)   . Hypertension   . 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 SLE (systemic lupus erythematosus) (HCC)   . Venous insufficiency (chronic) (peripheral)   . Osteopenia   . ANEMIA-IRON DEFICIENCY 03/02/2006    H&H 8.5/25.9 5/09. On 5/13 12.8/39.7 with MCV 80.4.     Marland Kitchen CEREBROVASCULAR ACCIDENT, HX OF 03/02/2006    Ruptured berry aneurysm, right hemiparesis & expressive aphasia     . SVT (supraventricular tachycardia) (HCC)     a. 03/2013 with mildly elevated troponin at that time.    Medications: Current Outpatient Prescriptions on File Prior to Visit  Medication Sig Dispense Refill  . albuterol (PROVENTIL) (2.5 MG/3ML) 0.083% nebulizer solution Take 3 mLs (2.5 mg total) by nebulization every 6 (six) hours as needed for wheezing or shortness of breath. 75 mL 12  . aspirin 81 MG tablet Take 81 mg by mouth daily.      Marland Kitchen atorvastatin (LIPITOR) 40 MG tablet Take 1 tablet (40 mg total) by mouth daily. 30 tablet 3  . calcium-vitamin D (OSCAL) 250-125 MG-UNIT per tablet Take 1 tablet by mouth daily.      . Cholecalciferol (REPLESTA) 50000 UNITS WAFR Take 50,000 Units by mouth every 7 (seven) days. On wednesdays    . cyclobenzaprine (FLEXERIL) 5 MG  tablet TAKE 1 TABLET BY MOUTH AT BEDTIME AS NEEDED FOR MUSCLE SPASMS 30 tablet 3  . denosumab (PROLIA) 60 MG/ML SOLN injection Inject 60 mg into the skin every 6 (six) months. Administer in upper arm, thigh, or abdomen  0  . gabapentin (NEURONTIN) 300 MG capsule Take 1 capsule (300 mg total) by mouth 3 (three) times daily. 270 capsule 3  . hydrochlorothiazide (HYDRODIURIL) 25 MG tablet Take 1 tablet (25 mg total) by mouth daily. 90 tablet 3  . HYDROcodone-acetaminophen (NORCO/VICODIN) 5-325 MG per tablet Take 1 tablet by mouth every 6 (six) hours as needed for moderate pain. 60 tablet 0  . ipratropium (ATROVENT) 0.02 % nebulizer solution Take 2.5 mLs (0.5 mg total) by nebulization every 4 (four) hours as needed for wheezing or shortness of breath. 62.5 mL 2  . levETIRAcetam (KEPPRA) 500 MG tablet Take 1 tablet (500 mg total) by mouth 2 (two) times daily. 180 tablet 3  . lisinopril (PRINIVIL,ZESTRIL) 10 MG tablet Take 1 tablet (10 mg total) by mouth daily. 30 tablet 3  . loratadine (CLARITIN) 10 MG tablet TAKE 1 TABLET (10 MG TOTAL) BY MOUTH DAILY. 30 tablet 6  . metoCLOPramide (REGLAN) 5 MG tablet Take 1 tablet (5 mg total) by mouth every 6 (six) hours as needed for nausea. 360 tablet 0  . metoprolol (LOPRESSOR) 50 MG tablet Take 1.5 tablets (75 mg total)  by mouth 2 (two) times daily. 270 tablet 3  . Multiple Vitamins-Minerals (ADULT ONE DAILY GUMMIES) CHEW Chew 2 each by mouth daily.    Marland Kitchen. omeprazole (PRILOSEC) 20 MG capsule Take 1 capsule (20 mg total) by mouth daily. 90 capsule 0  . oxyCODONE-acetaminophen (PERCOCET) 5-325 MG tablet Take 1 tablet by mouth every 6 (six) hours as needed for severe pain. 6 tablet 0  . polyethylene glycol (MIRALAX / GLYCOLAX) packet Take 17 g by mouth daily as needed. 100 each 0  . potassium chloride (K-DUR) 10 MEQ tablet Take 1 tablet (10 mEq total) by mouth 2 (two) times daily. 180 tablet 3  . promethazine (PHENERGAN) 12.5 MG tablet Take 1 tablet (12.5 mg total) by  mouth every 8 (eight) hours as needed for nausea. 45 tablet 2  . senna-docusate (SENOKOT-S) 8.6-50 MG tablet Take 2 tablets by mouth 2 (two) times daily. 120 tablet 3  . sulfamethoxazole-trimethoprim (BACTRIM DS,SEPTRA DS) 800-160 MG tablet Take 1 tablet by mouth 2 (two) times daily. 14 tablet 0  . venlafaxine XR (EFFEXOR-XR) 75 MG 24 hr capsule Take 1 capsule (75 mg total) by mouth daily with breakfast. 90 capsule 3   No current facility-administered medications on file prior to visit.    Allergies: Allergies  Allergen Reactions  . Lasix [Furosemide] Swelling    mild L facial swelling  . Penicillins Swelling  . Sulfur Itching    FH: Family History  Problem Relation Age of Onset  . Heart attack Brother 2460    SH: Social History   Social History  . Marital Status: Widowed    Spouse Name: N/A  . Number of Children: N/A  . Years of Education: N/A   Social History Main Topics  . Smoking status: Former Smoker -- 1.50 packs/day    Types: Cigarettes    Quit date: 05/30/2003  . Smokeless tobacco: Never Used     Comment: she smoked for 30 years before she quit  . Alcohol Use: No  . Drug Use: No  . Sexual Activity: Not Asked   Other Topics Concern  . None   Social History Narrative   Has a home nurse,    Lives with her 2 sons,    Former smoker   Widow          Review of Systems: Constitutional: Negative for fever, chills.  Eyes: Negative for blurred vision.  Respiratory: Negative for cough and shortness of breath.  Cardiovascular: Negative for chest pain.  Gastrointestinal: Negative for nausea, vomiting. Neurological: +dizziness.   Objective:   Vital Signs: Filed Vitals:   09/12/15 1603 09/12/15 1604  BP:  137/84  Pulse:  79  Temp: 97.9 F (36.6 C)   TempSrc: Oral   SpO2:  98%      BP Readings from Last 3 Encounters:  09/12/15 137/84  07/05/15 152/81  06/07/15 159/81    Physical Exam: Constitutional: Vital signs reviewed.  Patient is in NAD and  cooperative with exam.  She is sitting in a wheelchair and appears much older than stated age.  Head: Normocephalic and atraumatic. Eyes: Conjunctivae nl, no scleral icterus.  Neck: Supple. Cardiovascular: RRR, no MRG. Pulmonary/Chest: Normal effort, CTAB, no wheezes, rales, or rhonchi. Abdominal: Soft. NT/ND +BS. Extremities: Several scaly patches confined to the thighs bilaterally.    Assessment & Plan:   Assessment and plan was discussed and formulated with my attending.

## 2015-09-12 NOTE — Telephone Encounter (Signed)
Pt's speech therapist calls at today's visit- BP 160/110, denies h/a, weakness, chest pain. Does endorse abd pain, leg pain 5/10. Having some swallowing difficulty but this is the first time speech therapist has seen pt have a meal. Pt also has a fine rash that started on her legs and moved up to the trunk of her body and now to her arms, it is itchy. Family has transportation issues but will be here today for 1545 dr gill

## 2015-09-13 LAB — HIV ANTIBODY (ROUTINE TESTING W REFLEX): HIV SCREEN 4TH GENERATION: NONREACTIVE

## 2015-09-13 LAB — HEPATITIS C ANTIBODY: Hep C Virus Ab: <0.1 s/co ratio (ref 0.0–0.9)

## 2015-09-13 NOTE — Assessment & Plan Note (Addendum)
Pt has experience a pruritic rash confined to the thigh bilaterally ongoing for the past couple of weeks.  Son reports she had a similar rash that responded somewhat to A&D ointment?  No fever/chills or other systemic s/s.  She has changed soaps briefly but is now back to using [place].  Also uses Tide detergent.  No new medications.  VSS.  We discussed symptomatic tx or obtaining a biopsy and the son and patient wish to proceed with biopsy.  Per chart review, she does carry a history of SLE.   -advised to d/c Tide and use ALL free and clear -cont Dove -will obtain punch biopsy  -will check HIV, HepC    Procedure Note for Skin Biopsy  Site: Left thigh   Consent was signed.  The skin was cleaned with alcohol, and local anesthesia with lidocaine was obtained.  The specimen was collected using a 4mm biopsy punch.  Hemostasis was obtained by gentle pressure with gauze.  The site was dressed with a bandage.  The patient tolerated the procedure well. Plan: 1. Wound care reviewed with the patient. 2. Follow-up to be scheduled when the pathology results are available.

## 2015-09-15 NOTE — Progress Notes (Signed)
I saw and evaluated the patient.  I personally confirmed the key portions of Dr. Shiela MayerGill's history and exam and reviewed pertinent patient test results.  The assessment, diagnosis, and plan were formulated together and I agree with the documentation in the resident's note.  I was present at the bedside and supervised the punch biopsy. The patient tolerated the procedure well.

## 2015-09-20 DIAGNOSIS — D509 Iron deficiency anemia, unspecified: Secondary | ICD-10-CM | POA: Diagnosis not present

## 2015-09-20 DIAGNOSIS — I69320 Aphasia following cerebral infarction: Secondary | ICD-10-CM | POA: Diagnosis not present

## 2015-09-20 DIAGNOSIS — I872 Venous insufficiency (chronic) (peripheral): Secondary | ICD-10-CM | POA: Diagnosis not present

## 2015-09-20 DIAGNOSIS — I69351 Hemiplegia and hemiparesis following cerebral infarction affecting right dominant side: Secondary | ICD-10-CM | POA: Diagnosis not present

## 2015-09-20 DIAGNOSIS — I10 Essential (primary) hypertension: Secondary | ICD-10-CM | POA: Diagnosis not present

## 2015-09-20 DIAGNOSIS — F329 Major depressive disorder, single episode, unspecified: Secondary | ICD-10-CM | POA: Diagnosis not present

## 2015-09-21 ENCOUNTER — Other Ambulatory Visit: Payer: Self-pay | Admitting: Internal Medicine

## 2015-09-26 ENCOUNTER — Telehealth: Payer: Self-pay | Admitting: *Deleted

## 2015-09-26 DIAGNOSIS — I10 Essential (primary) hypertension: Secondary | ICD-10-CM | POA: Diagnosis not present

## 2015-09-26 DIAGNOSIS — I69351 Hemiplegia and hemiparesis following cerebral infarction affecting right dominant side: Secondary | ICD-10-CM | POA: Diagnosis not present

## 2015-09-26 DIAGNOSIS — D509 Iron deficiency anemia, unspecified: Secondary | ICD-10-CM | POA: Diagnosis not present

## 2015-09-26 DIAGNOSIS — F329 Major depressive disorder, single episode, unspecified: Secondary | ICD-10-CM | POA: Diagnosis not present

## 2015-09-26 DIAGNOSIS — I69320 Aphasia following cerebral infarction: Secondary | ICD-10-CM | POA: Diagnosis not present

## 2015-09-26 DIAGNOSIS — I872 Venous insufficiency (chronic) (peripheral): Secondary | ICD-10-CM | POA: Diagnosis not present

## 2015-09-26 NOTE — Telephone Encounter (Signed)
Speech path calls and states pt's bp is up again at her visit, she states that she always visits at the same time of day and has been seeing the pt for a long time, she is concerned though pt denies h/a, shortness of breath bp today was 163/101 Called son Berna [place]arcus, [place], he called back and encouraged him to check pt's bp daily at different times- tomorrow in am, sat mid day, Sunday evening, keep record and bring to 5/26 1545 dr taylor, also if pt c/o chest pain, short of breath, weakness to please call 911, he is agreeable

## 2015-10-02 ENCOUNTER — Telehealth: Payer: Self-pay | Admitting: *Deleted

## 2015-10-02 DIAGNOSIS — D509 Iron deficiency anemia, unspecified: Secondary | ICD-10-CM | POA: Diagnosis not present

## 2015-10-02 DIAGNOSIS — I69351 Hemiplegia and hemiparesis following cerebral infarction affecting right dominant side: Secondary | ICD-10-CM | POA: Diagnosis not present

## 2015-10-02 DIAGNOSIS — I69320 Aphasia following cerebral infarction: Secondary | ICD-10-CM | POA: Diagnosis not present

## 2015-10-02 DIAGNOSIS — I10 Essential (primary) hypertension: Secondary | ICD-10-CM | POA: Diagnosis not present

## 2015-10-02 DIAGNOSIS — F329 Major depressive disorder, single episode, unspecified: Secondary | ICD-10-CM | POA: Diagnosis not present

## 2015-10-02 DIAGNOSIS — I872 Venous insufficiency (chronic) (peripheral): Secondary | ICD-10-CM | POA: Diagnosis not present

## 2015-10-02 NOTE — Telephone Encounter (Signed)
recert for 1xweek for 3 weeks- speech therapy bp 162/110 this morning Needs an order written for a blood pressure cuff, the one they have is not functioning properly

## 2015-10-04 ENCOUNTER — Other Ambulatory Visit: Payer: Self-pay | Admitting: Internal Medicine

## 2015-10-04 MED ORDER — BLOOD PRESSURE CUFF MISC: Status: AC

## 2015-10-10 DIAGNOSIS — D509 Iron deficiency anemia, unspecified: Secondary | ICD-10-CM | POA: Diagnosis not present

## 2015-10-10 DIAGNOSIS — I872 Venous insufficiency (chronic) (peripheral): Secondary | ICD-10-CM | POA: Diagnosis not present

## 2015-10-10 DIAGNOSIS — F329 Major depressive disorder, single episode, unspecified: Secondary | ICD-10-CM | POA: Diagnosis not present

## 2015-10-10 DIAGNOSIS — I69351 Hemiplegia and hemiparesis following cerebral infarction affecting right dominant side: Secondary | ICD-10-CM | POA: Diagnosis not present

## 2015-10-10 DIAGNOSIS — I69320 Aphasia following cerebral infarction: Secondary | ICD-10-CM | POA: Diagnosis not present

## 2015-10-10 DIAGNOSIS — I10 Essential (primary) hypertension: Secondary | ICD-10-CM | POA: Diagnosis not present

## 2015-10-11 ENCOUNTER — Ambulatory Visit (INDEPENDENT_AMBULATORY_CARE_PROVIDER_SITE_OTHER): Payer: Medicare Other | Admitting: Internal Medicine

## 2015-10-11 ENCOUNTER — Encounter: Payer: Self-pay | Admitting: Internal Medicine

## 2015-10-11 VITALS — BP 127/85 | HR 89 | Temp 98.4°F | Ht 71.0 in | Wt 203.0 lb

## 2015-10-11 DIAGNOSIS — I10 Essential (primary) hypertension: Secondary | ICD-10-CM

## 2015-10-11 NOTE — Assessment & Plan Note (Signed)
Patient presents 2/2 speech therapy concerns of BP elevated to 160s during therapy.  Patient denies HAs but endorses blurry vision 2/2 cataracts.  She also feels dizzy upon standing, usually in the morning after taking all of her medications.  A/P: HTN. Currently normotensive.  Given her age, BP of 160 is likely OK, especially if she is orthostatic.  Will see if she less symptomatic after changing timing of her medications and monitor her BP log. - Take Lisinopril at night - Continue HCTZ and Metop in the morning. - RTC 1 month

## 2015-10-11 NOTE — Patient Instructions (Signed)
1. Take the LISINOPRIL before bed.  Check your blood pressure every morning.  How to Take Your Blood Pressure HOW DO I GET A BLOOD PRESSURE MACHINE?  You can buy an electronic home blood pressure machine at your local pharmacy. Insurance will sometimes cover the cost if you have a prescription.  Ask your doctor what type of machine is best for you. There are different machines for your arm and your wrist.  If you decide to buy a machine to check your blood pressure on your arm, first check the size of your arm so you can buy the right size cuff. To check the size of your arm:   Use a measuring tape that shows both inches and centimeters.   Wrap the measuring tape around the upper-middle part of your arm. You may need someone to help you measure.   Write down your arm measurement in both inches and centimeters.   To measure your blood pressure correctly, it is important to have the right size cuff.   If your arm is up to 13 inches (up to 34 centimeters), get an adult cuff size.  If your arm is 13 to 17 inches (35 to 44 centimeters), get a large adult cuff size.    If your arm is 17 to 20 inches (45 to 52 centimeters), get an adult thigh cuff.  WHAT DO THE NUMBERS MEAN?   There are two numbers that make up your blood pressure. For example: 120/80.  The first number (120 in our example) is called the "systolic pressure." It is a measure of the pressure in your blood vessels when your heart is pumping blood.  The second number (80 in our example) is called the "diastolic pressure." It is a measure of the pressure in your blood vessels when your heart is resting between beats.  Your doctor will tell you what your blood pressure should be. WHAT SHOULD I DO BEFORE I CHECK MY BLOOD PRESSURE?   Try to rest or relax for at least 30 minutes before you check your blood pressure.  Do not smoke.  Do not have any drinks with caffeine, such as:  Soda.  Coffee.  Tea.  Check your  blood pressure in a quiet room.  Sit down and stretch out your arm on a table. Keep your arm at about the level of your heart. Let your arm relax.  Make sure that your legs are not crossed. HOW DO I CHECK MY BLOOD PRESSURE?  Follow the directions that came with your machine.  Make sure you remove any tight-fitting clothing from your arm or wrist. Wrap the cuff around your upper arm or wrist. You should be able to fit a finger between the cuff and your arm. If you cannot fit a finger between the cuff and your arm, it is too tight and should be removed and rewrapped.  Some units require you to manually pump up the arm cuff.  Automatic units inflate the cuff when you press a button.  Cuff deflation is automatic in both models.  After the cuff is inflated, the unit measures your blood pressure and pulse. The readings are shown on a monitor. Hold still and breathe normally while the cuff is inflated.  Getting a reading takes less than a minute.  Some models store readings in a memory. Some provide a printout of readings. If your machine does not store your readings, keep a written record.  Take readings with you to your next visit with  your doctor.   This information is not intended to replace advice given to you by your health care provider. Make sure you discuss any questions you have with your health care provider.   Document Released: 04/16/2008 Document Revised: 05/25/2014 Document Reviewed: 06/29/2013 Elsevier Interactive Patient Education [employer]2016 [employer] Inc.

## 2015-10-11 NOTE — Progress Notes (Signed)
Patient ID: Wanda [name], female   DOB: 1956/03/10, 61 y.o.   MRN: [id number]   Subjective:   Patient ID: Wanda [name] female   DOB: Feb 03, 1956 60 y.o.   MRN: [id number]  HPI: [name] is a 60 y.o. female with PMH as below, here for f/u BP.  Please see Problem-Based charting for the status of the patient's chronic medical issues.     Past Medical History  Diagnosis Date  . Depression   . GERD (gastroesophageal reflux disease)   . Hypertension   . 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 SLE (systemic lupus erythematosus) (HCC)   . Venous insufficiency (chronic) (peripheral)   . Osteopenia   . ANEMIA-IRON DEFICIENCY 03/02/2006    H&H 8.5/25.9 5/09. On 5/13 12.8/39.7 with MCV 80.4.     Marland Kitchen CEREBROVASCULAR ACCIDENT, HX OF 03/02/2006    Ruptured berry aneurysm, right hemiparesis & expressive aphasia     . SVT (supraventricular tachycardia) (HCC)     a. 03/2013 with mildly elevated troponin at that time.   Current Outpatient Prescriptions  Medication Sig Dispense Refill  . albuterol (PROVENTIL) (2.5 MG/3ML) 0.083% nebulizer solution Take 3 mLs (2.5 mg total) by nebulization every 6 (six) hours as needed for wheezing or shortness of breath. 75 mL 12  . aspirin 81 MG tablet Take 81 mg by mouth daily.      Marland Kitchen atorvastatin (LIPITOR) 40 MG tablet Take 1 tablet (40 mg total) by mouth daily. 30 tablet 3  . Blood Pressure Monitoring (BLOOD PRESSURE CUFF) MISC Does not apply route 1 each 0  . calcium-vitamin D (OSCAL) 250-125 MG-UNIT per tablet Take 1 tablet by mouth daily.      . Cholecalciferol (REPLESTA) 50000 UNITS WAFR Take 50,000 Units by mouth every 7 (seven) days. On wednesdays    . cyclobenzaprine (FLEXERIL) 5 MG tablet TAKE 1 TABLET BY MOUTH AT BEDTIME AS NEEDED FOR MUSCLE SPASMS 30 tablet 3  . denosumab (PROLIA) 60 MG/ML SOLN injection Inject 60 mg into the skin every 6 (six) months. Administer in upper arm, thigh, or abdomen  0  . gabapentin  (NEURONTIN) 300 MG capsule Take 1 capsule (300 mg total) by mouth 3 (three) times daily. 270 capsule 3  . hydrochlorothiazide (HYDRODIURIL) 25 MG tablet Take 1 tablet (25 mg total) by mouth daily. 90 tablet 3  . HYDROcodone-acetaminophen (NORCO/VICODIN) 5-325 MG per tablet Take 1 tablet by mouth every 6 (six) hours as needed for moderate pain. 60 tablet 0  . ipratropium (ATROVENT) 0.02 % nebulizer solution Take 2.5 mLs (0.5 mg total) by nebulization every 4 (four) hours as needed for wheezing or shortness of breath. 62.5 mL 2  . levETIRAcetam (KEPPRA) 500 MG tablet Take 1 tablet (500 mg total) by mouth 2 (two) times daily. 180 tablet 3  . lisinopril (PRINIVIL,ZESTRIL) 10 MG tablet Take 1 tablet (10 mg total) by mouth daily. 30 tablet 3  . loratadine (CLARITIN) 10 MG tablet TAKE 1 TABLET (10 MG TOTAL) BY MOUTH DAILY. 30 tablet 6  . metoCLOPramide (REGLAN) 5 MG tablet Take 1 tablet (5 mg total) by mouth every 6 (six) hours as needed for nausea. 360 tablet 0  . metoprolol (LOPRESSOR) 50 MG tablet Take 1.5 tablets (75 mg total) by mouth 2 (two) times daily. 270 tablet 3  . Multiple Vitamins-Minerals (ADULT ONE DAILY GUMMIES) CHEW Chew 2 each by mouth daily.    Marland Kitchen omeprazole (PRILOSEC) 20 MG capsule Take 1 capsule (  20 mg total) by mouth daily. 90 capsule 0  . oxyCODONE-acetaminophen (PERCOCET) 5-325 MG tablet Take 1 tablet by mouth every 6 (six) hours as needed for severe pain. 6 tablet 0  . polyethylene glycol (MIRALAX / GLYCOLAX) packet Take 17 g by mouth daily as needed. 100 each 0  . potassium chloride (K-DUR) 10 MEQ tablet Take 1 tablet (10 mEq total) by mouth 2 (two) times daily. 180 tablet 3  . promethazine (PHENERGAN) 12.5 MG tablet Take 1 tablet (12.5 mg total) by mouth every 8 (eight) hours as needed for nausea. 45 tablet 2  . senna-docusate (SENOKOT-S) 8.6-50 MG tablet Take 2 tablets by mouth 2 (two) times daily. 120 tablet 3  . sulfamethoxazole-trimethoprim (BACTRIM DS,SEPTRA DS) 800-160 MG  tablet Take 1 tablet by mouth 2 (two) times daily. 14 tablet 0  . venlafaxine XR (EFFEXOR-XR) 75 MG 24 hr capsule Take 1 capsule (75 mg total) by mouth daily with breakfast. 90 capsule 3   No current facility-administered medications for this visit.   Family History  Problem Relation Age of Onset  . Heart attack Brother 7460   Social History   Social History  . Marital Status: Widowed    Spouse Name: N/A  . Number of Children: N/A  . Years of Education: N/A   Social History Main Topics  . Smoking status: Former Smoker -- 1.50 packs/day    Types: Cigarettes    Quit date: 05/30/2003  . Smokeless tobacco: Never Used     Comment: she smoked for 30 years before she quit  . Alcohol Use: No  . Drug Use: No  . Sexual Activity: Not Asked   Other Topics Concern  . None   Social History Narrative   Has a home nurse,    Lives with her 2 sons,    Former smoker   Widow         Review of Systems: No HAs.  She has blurry vision 2/2 cataracts.  No CP or SOB.  Endorses dizziness on standing. Objective:  Physical Exam: Filed Vitals:   10/11/15 1554  BP: 127/85  Pulse: 89  Temp: 98.4 F (36.9 C)  TempSrc: Oral  Height: 5\' 11"  (1.803 m)  Weight: 203 lb (92.08 kg)  SpO2: 98%   Physical Exam  Constitutional: She is well-developed, well-nourished, and in no distress. No distress.  Right sided paralysis with contracture.  HENT:  Head: Normocephalic and atraumatic.  Eyes: EOM are normal. No scleral icterus.  Neck: No tracheal deviation present.  Cardiovascular: Normal rate, regular rhythm and normal heart sounds.   Pulmonary/Chest: Effort normal and breath sounds normal. No stridor. No respiratory distress. She has no wheezes.  Neurological: She is alert.  Skin: Skin is warm and dry. She is not diaphoretic.     Assessment & Plan:   Patient and case were discussed with Dr. Heide SparkNarendra.  Please refer to Problem Based charting for further documentation.

## 2015-10-15 DIAGNOSIS — D509 Iron deficiency anemia, unspecified: Secondary | ICD-10-CM | POA: Diagnosis not present

## 2015-10-15 DIAGNOSIS — I872 Venous insufficiency (chronic) (peripheral): Secondary | ICD-10-CM | POA: Diagnosis not present

## 2015-10-15 DIAGNOSIS — I10 Essential (primary) hypertension: Secondary | ICD-10-CM | POA: Diagnosis not present

## 2015-10-15 DIAGNOSIS — I69320 Aphasia following cerebral infarction: Secondary | ICD-10-CM | POA: Diagnosis not present

## 2015-10-15 DIAGNOSIS — I69351 Hemiplegia and hemiparesis following cerebral infarction affecting right dominant side: Secondary | ICD-10-CM | POA: Diagnosis not present

## 2015-10-15 DIAGNOSIS — F329 Major depressive disorder, single episode, unspecified: Secondary | ICD-10-CM | POA: Diagnosis not present

## 2015-10-17 ENCOUNTER — Other Ambulatory Visit: Payer: Self-pay | Admitting: Internal Medicine

## 2015-10-17 NOTE — Progress Notes (Signed)
[facility] Attending  Case discussed with Dr. Taylor at the time of the visit.  We reviewed the resident's history and exam and pertinent patient test results.  I agree with the assessment, diagnosis, and plan of care documented in the resident's note. 

## 2015-10-23 DIAGNOSIS — I69351 Hemiplegia and hemiparesis following cerebral infarction affecting right dominant side: Secondary | ICD-10-CM | POA: Diagnosis not present

## 2015-10-23 DIAGNOSIS — I872 Venous insufficiency (chronic) (peripheral): Secondary | ICD-10-CM | POA: Diagnosis not present

## 2015-10-23 DIAGNOSIS — I10 Essential (primary) hypertension: Secondary | ICD-10-CM | POA: Diagnosis not present

## 2015-10-23 DIAGNOSIS — I69320 Aphasia following cerebral infarction: Secondary | ICD-10-CM | POA: Diagnosis not present

## 2015-10-23 DIAGNOSIS — D509 Iron deficiency anemia, unspecified: Secondary | ICD-10-CM | POA: Diagnosis not present

## 2015-10-23 DIAGNOSIS — F329 Major depressive disorder, single episode, unspecified: Secondary | ICD-10-CM | POA: Diagnosis not present

## 2015-10-24 ENCOUNTER — Ambulatory Visit: Payer: Medicare Other | Attending: Oncology | Admitting: Physical Therapy

## 2015-10-24 DIAGNOSIS — M6281 Muscle weakness (generalized): Secondary | ICD-10-CM | POA: Insufficient documentation

## 2015-10-24 DIAGNOSIS — I69851 Hemiplegia and hemiparesis following other cerebrovascular disease affecting right dominant side: Secondary | ICD-10-CM | POA: Insufficient documentation

## 2015-10-24 NOTE — Therapy (Signed)
[facility] [address] [place], [place], [address] Phone: [phone]   Fax:  [phone]  Patient Details  Name: Wanda [name] MRN: [id number] Date of Birth: 29-Jan-1956 Referring Provider:  Iline Oven, MD  Encounter Date: 10/24/2015   .  Mobility/Seating Evaluation    PATIENT INFORMATION: Name: Wanda [name] DOB: 30-Mar-1956  Sex: Female Date seen: 10/24/15 Time: 0900  Address:  [address] [place] [address] Physician: Sharia Reeve, MD This evaluation/justification form will serve as the LMN for the following suppliers: __________________________ Supplier: [facility] Contact Person: Yvone Neu Phone:  [phone]   Seating Therapist: Mady Haagensen, PT Phone:   [phone]   Phone: [phone] (Mobile)    Spouse/Parent/Caregiver name: Neva Ramaswamy (son)  Phone number: [phone]/ [phone]  Insurance/Payer: Medicare/Medicaid     Reason for Referral: Wheelchair evaluation  Patient/Caregiver Goals: Custom manual wheelchair for lighter weight, ease of use  Patient was seen for face-to-face evaluation for new manual wheelchair.  Also present was Luz Brazen, ATP, Mady Haagensen, PT, [place], CNA to discuss recommendations and wheelchair options.  Further paperwork was completed and sent to vendor.  Patient appears to qualify for manual mobility device at this time per objective findings.   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   Height: 5'11" Weight: 203 lbs. Explain recent changes or trends in weight: ?????   History including Falls: No history of falls recently.  Pt had CVA in 02/2006 with R hemiplegia and expressive aphasia.  Pt has communication device she is learning to use and  new caregiver present.     HOME ENVIRONMENT: '[x]'$ House  '[]'$ Condo/town home  '[]'$ Apartment  '[]'$ Assisted Living    '[]'$ Lives Alone '[x]'$  Lives with Others                                                                                          Hours with caregiver: 7-8 hours/day 6 days/wk  '[x]'$ Home is accessible to patient           Stairs      '[]'$ Yes '[]'$  No     Ramp '[x]'$ Yes '[]'$ No Comments:  ?????   COMMUNITY ADL: TRANSPORTATION: '[x]'$ Car    '[]'$ Van    '[]'$ Public Transportation    '[]'$ Adapted w/c Lift    '[]'$ Ambulance    '[]'$ Other:       '[]'$ Sits in wheelchair during transport  Employment/School: ????? Specific requirements pertaining to mobility ?????  Other: ?????    FUNCTIONAL/SENSORY PROCESSING SKILLS:  Handedness:   '[]'$ Right     '[]'$ Left    '[]'$ NA  Comments:  R hand hemiplegic side; uses L hand  Functional Processing Skills for Wheeled Mobility '[x]'$ Processing Skills are adequate for safe wheelchair operation  Areas of concern than may interfere with safe operation of wheelchair Description of problem   '[]'$  Attention to environment      '[]'$ Judgment      '[]'$  Hearing  '[]'$  Vision or visual processing      '[]'$ Motor Planning  '[]'$  Fluctuations  in Behavior  ?????    VERBAL COMMUNICATION: '[x]'$ WFL receptive '[]'$  WFL expressive '[]'$ Understandable  '[x]'$ Difficult to understand  '[]'$ non-communicative '[x]'$  Uses an augmented communication device  CURRENT SEATING / MOBILITY: Current Mobility Base:  '[]'$ None '[]'$ Dependent '[x]'$ Manual '[]'$ Scooter '[]'$ Power  Type of Control: ?????  Manufacturer:  BreezySize:  ?????Age: ?????  Current Condition of Mobility Base:  In disrepair   Current Wheelchair components:  ?????  Describe posture in present seating system:  Pt arrives via car and uses manual wheelchair in clinic-posterior pevlic tilt, slumped posture.      SENSATION and SKIN ISSUES: Sensation '[x]'$ Intact  '[]'$ Impaired '[]'$ Absent  Level of sensation: ????? Pressure Relief: Able to perform effective pressure relief :    '[]'$ Yes  '[x]'$  No Method: Caregiver  assisted transfers to other chairs in home. If not, Why?: weakness, hemiparesis  Skin Issues/Skin Integrity Current Skin Issues  '[]'$ Yes '[x]'$ No '[]'$ Intact '[]'$  Red area'[]'$  Open Area  '[]'$ Scar Tissue '[]'$ At risk from prolonged sitting Where  ?????  History of Skin Issues  '[]'$ Yes '[x]'$ No Where  ????? When  ?????  Hx of skin flap surgeries  '[]'$ Yes '[x]'$ No Where  ????? When  ?????  Limited sitting tolerance '[x]'$ Yes '[]'$ No Hours spent sitting in wheelchair daily: Pt currently uses wheelchair only for room to room transport for position changes.  Complaint of Pain:  Please describe: No pain at eval, but pt answers yes to having pain at times in L leg and lower back.   Swelling/Edema: RLE edema noted   ADL STATUS (in reference to wheelchair use):  Indep Assist Unable Indep with Equip Not assessed Comments  Dressing ????? X X ????? ????? Needs caregiver assistance for these activities of daily living  Eating X ????? ????? ????? ????? ?????  Toileting ????? X X ????? ????? ?????  Bathing ????? X X ????? ????? ?????  Grooming/Hygiene ????? X X ????? ????? ?????  Meal Prep ????? ????? X ????? ????? ?????  IADLS ????? ????? ????? ????? X ?????  Bowel Management: '[x]'$ Continent  '[]'$ Incontinent  '[]'$ Accidents Comments:  ?????  Bladder Management: '[x]'$ Continent  '[]'$ Incontinent  '[]'$ Accidents Comments:  Wears briefs, sometimes accidents at night.     WHEELCHAIR SKILLS: Manual w/c Propulsion: '[]'$ UE or LE strength and endurance sufficient to participate in ADLs using manual wheelchair Arm : '[x]'$ left '[]'$ right   '[]'$ Both      Distance: 25-50 ft Foot:  '[x]'$ left '[]'$ right   '[]'$ Both  Operate Scooter: '[]'$  Strength, hand grip, balance and transfer appropriate for use '[]'$ Living environment is accessible for use of scooter  Operate Power w/c:  '[]'$  Std. Joystick   '[]'$  Alternative Controls Indep '[]'$  Assist '[]'$  Dependent/unable '[]'$  N/A '[]'$   '[]'$ Safe          '[]'$  Functional      Distance: ?????  Bed confined without wheelchair '[]'$  Yes '[]'$  No    STRENGTH/RANGE OF MOTION:  ????? Range of Motion Strength  Shoulder Holds R shoulder in abduction and internal rotation-fixed contracture  L shoulder abduction 4/5, flexion 4/5  Elbow R elbow limited P/ROM 95 degrees from full extension in contracture L elbow flexion and extension 5/5  Wrist/Hand contracture of R wrist held in 90 degrees of flexion L wrist flexion and extension 5/5  Hip WFL-painful R hip flexion 4/5, L hip flexion 4/5  Knee limited A/ROM in full extension L knee extension 4/5, R knee extension 3-/5; L knee flexion 4/5, R knee flexion 3-/5  Ankle neutral passive ROM dorsiflexion L ankle dorsiflexion 4+/5, R ankle dorsiflexion 3-/5  MOBILITY/BALANCE:  '[]'$  Patient is totally dependent for mobility  Pt requires max/total assist for transfers and brief standing.    Balance Transfers Ambulation  Sitting Balance: Standing Balance: '[]'$  Independent '[]'$  Independent/Modified Independent  '[]'$  WFL     '[]'$  WFL '[]'$  Supervision '[]'$  Supervision  '[x]'$  Uses UE for balance  '[]'$  Supervision '[]'$  Min Assist '[]'$  Ambulates with Assist  ?????    '[]'$  Min Assist '[]'$  Min assist '[]'$  Mod Assist '[]'$  Ambulates with Device:      '[]'$  RW  '[]'$  StW  '[]'$  Cane  '[]'$  ?????  '[]'$  Mod Assist '[]'$  Mod assist '[x]'$  Max assist   '[]'$  Max Assist '[x]'$  Max assist '[]'$  Dependent '[]'$  Indep. Short Distance Only  '[]'$  Unable '[]'$  Unable '[]'$  Lift / Sling Required Distance (in feet)  ?????   '[]'$  Sliding board '[x]'$  Unable to Ambulate (see explanation below)  Cardio Status:  '[]'$ Intact  '[]'$  Impaired   '[]'$  NA     ?????  Respiratory Status:  '[]'$ Intact   '[]'$ Impaired   '[]'$ NA     ?????  Orthotics/Prosthetics: NA  Comments (Address manual vs power w/c vs scooter): Pt not able to ambulate-pt max assist to total assist for stand pivot transfer wheelchair<>mat.         Anterior / Posterior Obliquity Rotation-Pelvis ?????  PELVIS    '[]'$  '[x]'$  '[]'$   Neutral Posterior Anterior  '[x]'$  '[]'$  '[]'$   WFL Rt elev Lt elev  '[x]'$  '[]'$  '[]'$   WFL Right Left                      Anterior     Anterior     '[]'$  Fixed '[]'$  Other '[x]'$  Partly Flexible '[]'$  Flexible   '[]'$  Fixed '[]'$  Other '[]'$  Partly Flexible  '[]'$  Flexible  '[]'$  Fixed '[]'$  Other '[]'$  Partly Flexible  '[]'$  Flexible   TRUNK  '[]'$  '[x]'$  '[]'$   WFL ? Thoracic ? Lumbar  Kyphosis Lordosis  '[x]'$  '[]'$  '[]'$   WFL Convex Convex  Right Left '[]'$ c-curve '[]'$ s-curve '[]'$ multiple  '[]'$  Neutral '[x]'$  Left-anterior '[]'$  Right-anterior     '[]'$  Fixed '[]'$  Flexible '[x]'$  Partly Flexible '[]'$  Other  '[]'$  Fixed '[]'$  Flexible '[]'$  Partly Flexible '[]'$  Other  '[]'$  Fixed             '[]'$  Flexible '[x]'$  Partly Flexible '[]'$  Other    Position Windswept  ?????  HIPS          '[x]'$            '[]'$               '[]'$    Neutral       Abduct        ADduct         '[x]'$           '[]'$            '[]'$   Neutral Right           Left      '[]'$  Fixed '[]'$  Subluxed '[]'$  Partly Flexible '[]'$  Dislocated '[]'$  Flexible  '[]'$  Fixed '[]'$  Other '[]'$  Partly Flexible  '[]'$  Flexible                 Foot Positioning Knee Positioning  ?????    '[]'$  WFL  '[x]'$ Lt '[]'$ Rt '[x]'$  WFL  '[]'$ Lt '[]'$ Rt    KNEES ROM concerns: ROM concerns:    & Dorsi-Flexed '[]'$ Lt '[]'$ Rt ?????    FEET Plantar Flexed '[]'$ Lt '[x]'$ Rt      Inversion                 '[]'$   Lt '[x]'$ Rt      Eversion                 '[]'$ Lt '[]'$ Rt     HEAD '[x]'$  Functional '[x]'$  Good Head Control  ?????  & '[]'$  Flexed         '[]'$  Extended '[]'$  Adequate Head Control    NECK '[]'$  Rotated  Lt  '[]'$  Lat Flexed Lt '[]'$  Rotated  Rt '[]'$  Lat Flexed Rt '[]'$  Limited Head Control     '[]'$  Cervical Hyperextension '[]'$  Absent  Head Control     SHOULDERS ELBOWS WRIST& HAND R wrist contracted in 90 degrees of flexion; fingers in flexion and thumb drawn into palm of hand (contracted)      Left     Right    Left     Right    Left     Right   U/E '[x]'$ Functional           '[]'$ Functional ????? contracted in elbox flexion '[]'$ Fisting             '[]'$ Fisting      '[]'$ elev   '[]'$ dep      '[x]'$ elev   '[]'$ dep       '[]'$ pro -'[]'$ retract     '[x]'$ pro  '[]'$ retract '[]'$ subluxed             '[]'$ subluxed           Goals for Wheelchair Mobility  '[x]'$  Independence with mobility in the home  with motor related ADLs (MRADLs)  '[]'$  Independence with MRADLs in the community '[]'$  Provide dependent mobility  '[]'$  Provide recline     '[]'$ Provide tilt   Goals for Seating system '[x]'$  Optimize pressure distribution '[x]'$  Provide support needed to facilitate function or safety '[x]'$  Provide corrective forces to assist with maintaining or improving posture '[x]'$  Accommodate client's posture:   current seated postures and positions are not flexible or will not tolerate corrective forces '[x]'$  Client to be independent with relieving pressure in the wheelchair '[]'$ Enhance physiological function such as breathing, swallowing, digestion  Simulation ideas/Equipment trials:????? State why other equipment was unsuccessful:?????   MOBILITY BASE RECOMMENDATIONS and JUSTIFICATION: MOBILITY COMPONENT JUSTIFICATION  Manufacturer: Ki MobilityModel: Catalyst 5   Size: Width 19Seat Depth 20 '[x]'$ provide transport from point A to B      '[x]'$ promote Indep mobility  '[x]'$ is not a safe, functional ambulator '[x]'$ walker or cane inadequate '[x]'$ non-standard width/depth necessary to accommodate anatomical measurement '[]'$  ?????  '[x]'$ Manual Mobility Base '[x]'$ non-functional ambulator    '[]'$ Scooter/POV  '[]'$ can safely operate  '[]'$ can safely transfer   '[]'$ has adequate trunk stability  '[]'$ cannot functionally propel manual w/c  '[]'$ Power Mobility Base  '[]'$ non-ambulatory  '[]'$ cannot functionally propel manual wheelchair  '[]'$  cannot functionally and safely operate scooter/POV '[]'$ can safely operate and willing to  '[]'$ Stroller Base '[]'$ infant/child  '[]'$ unable to propel manual wheelchair '[]'$ allows for growth '[]'$ non-functional ambulator '[]'$ non-functional UE '[]'$ Indep mobility is not a goal at this time  '[]'$ Tilt  '[]'$ Forward '[]'$ Backward '[]'$ Powered tilt  '[]'$ Manual tilt  '[]'$ change position against gravitational force on head and shoulders  '[]'$ change position for pressure relief/cannot weight shift '[]'$ transfers  '[]'$ management of tone '[]'$ rest periods '[]'$ control  edema '[]'$ facilitate postural control  '[]'$  ?????  '[]'$ Recline  '[]'$ Power recline on power base '[]'$ Manual recline on manual base  '[]'$ accommodate femur to back angle  '[]'$ bring to full recline for ADL care  '[]'$ change position for pressure relief/cannot weight shift '[]'$ rest periods '[]'$ repositioning for transfers or clothing/diaper /catheter changes '[]'$ head positioning  '[x]'$ Lighter weight required '[x]'$ self- propulsion  '[x]'$ lifting '[]'$  ?????  '[]'$   Heavy Duty required '[]'$ user weight greater than 250# '[]'$ extreme tone/ over active movement '[]'$ broken frame on previous chair '[]'$  ?????  '[x]'$  Back  '[]'$  Angle Adjustable '[]'$  Custom molded Tension adjustable '[x]'$ postural control '[x]'$ control of tone/spasticity '[]'$ accommodation of range of motion '[]'$ UE functional control '[]'$ accommodation for seating system '[]'$  ????? '[x]'$ provide lateral trunk support '[]'$ accommodate deformity '[x]'$ provide posterior trunk support '[x]'$ provide lumbar/sacral support '[x]'$ support trunk in midline '[x]'$ Pressure relief over spinal processes  '[x]'$  [place] skin protection '[]'$ impaired sensation  '[]'$ decubitus ulcers present '[]'$ history of pressure ulceration '[]'$ prevent pelvic extension '[x]'$ low maintenance  '[x]'$ stabilize pelvis  '[]'$ accommodate obliquity '[]'$ accommodate multiple deformity '[x]'$ neutralize lower extremity position '[x]'$ increase pressure distribution '[]'$  ?????  '[]'$  Pelvic/thigh support  '[]'$  Lateral thigh guide '[]'$  Distal medial pad  '[]'$  Distal lateral pad '[]'$  pelvis in neutral '[]'$ accommodate pelvis '[]'$  position upper legs '[]'$  alignment '[]'$  accommodate ROM '[]'$  decr adduction '[]'$ accommodate tone '[]'$ removable for transfers '[]'$ decr abduction  '[]'$  Lateral trunk Supports '[]'$  Lt     '[]'$  Rt '[]'$ decrease lateral trunk leaning '[]'$ control tone '[]'$ contour for increased contact '[]'$ safety  '[]'$ accommodate asymmetry '[]'$  ?????  '[]'$  Mounting hardware  '[]'$ lateral trunk supports  '[]'$ back   '[]'$ seat '[]'$ headrest      '[]'$  thigh support '[]'$ fixed   '[]'$ swing away '[]'$ attach seat  platform/cushion to w/c frame '[]'$ attach back cushion to w/c frame '[]'$ mount postural supports '[]'$ mount headrest  '[]'$ swing medial thigh support away '[]'$ swing lateral supports away for transfers  '[]'$  ?????    Armrests  '[]'$ fixed '[x]'$ adjustable height '[]'$ removable   '[]'$ swing away  '[x]'$ flip back   '[]'$ reclining '[x]'$ full length pads '[]'$ desk    '[]'$ pads tubular  '[x]'$ provide support with elbow at 90   '[]'$ provide support for w/c tray '[x]'$ change of height/angles for variable activities '[x]'$ remove for transfers '[x]'$ allow to come closer to table top '[x]'$ remove for access to tables '[]'$  ?????  Hangers/ Leg rests  '[]'$ 60 '[x]'$ 70 '[]'$ 90 '[]'$ elevating '[]'$ heavy duty  '[]'$ articulating '[]'$ fixed '[x]'$ lift off '[x]'$ swing away     '[]'$ power '[x]'$ provide LE support  '[x]'$ accommodate to hamstring tightness '[]'$ elevate legs during recline   '[]'$ provide change in position for Legs '[x]'$ Maintain placement of feet on footplate '[]'$ durability '[x]'$ enable transfers '[]'$ decrease edema '[]'$ Accommodate lower leg length '[]'$  ?????  Foot support Footplate    '[x]'$ Lt  '[x]'$  Rt  '[]'$  Center mount '[x]'$ flip up     '[x]'$ depth/angle adjustable '[]'$ Amputee adapter    '[]'$  Lt     '[]'$  Rt '[x]'$ provide foot support '[x]'$ accommodate to ankle ROM '[x]'$ transfers '[]'$ Provide support for residual extremity '[]'$  allow foot to go under wheelchair base '[]'$  decrease tone  '[]'$  ?????  '[x]'$  Ankle strap/heel loops '[x]'$ support foot on foot support '[]'$ decrease extraneous movement '[x]'$ provide input to heel  '[x]'$ protect foot  Tires: '[]'$ pneumatic  '[]'$ flat free inserts  '[x]'$ solid  '[x]'$ decrease maintenance  '[x]'$ prevent frequent flats '[]'$ increase shock absorbency '[]'$ decrease pain from road shock '[]'$ decrease spasms from road shock '[]'$  ?????  '[]'$  Headrest  '[]'$ provide posterior head support '[]'$ provide posterior neck support '[]'$ provide lateral head support '[]'$ provide anterior head support '[]'$ support during tilt and recline '[]'$ improve feeding   '[]'$ improve respiration '[]'$ placement of switches '[]'$ safety  '[]'$ accommodate ROM  '[]'$ accommodate  tone '[]'$ improve visual orientation  '[]'$  Anterior chest strap '[]'$  Vest '[]'$  Shoulder retractors  '[]'$ decrease forward movement of shoulder '[]'$ accommodation of TLSO '[]'$ decrease forward movement of trunk '[]'$ decrease shoulder elevation '[]'$ added abdominal support '[]'$ alignment '[]'$ assistance with shoulder control  '[]'$  ?????  Pelvic Positioner '[x]'$ Belt '[]'$ SubASIS bar '[]'$ Dual Pull '[]'$ stabilize tone '[x]'$ decrease falling out of chair/ **will not Decr potential for sliding due to pelvic tilting '[]'$ prevent excessive rotation '[]'$ pad for protection over  boney prominence '[]'$ prominence comfort '[]'$ special pull angle to control rotation '[]'$  ?????  Upper Extremity Support '[]'$ L   '[]'$  R '[]'$ Arm trough    '[]'$ hand support '[]'$  tray       '[]'$ full tray '[]'$ swivel mount '[]'$ decrease edema      '[]'$ decrease subluxation   '[]'$ control tone   '[]'$ placement for AAC/Computer/EADL '[]'$ decrease gravitational pull on shoulders '[]'$ provide midline positioning '[]'$ provide support to increase UE function '[]'$ provide hand support in natural position '[]'$ provide work surface   POWER WHEELCHAIR CONTROLS  '[]'$ Proportional  '[]'$ Non-Proportional Type ????? '[]'$ Left  '[]'$ Right '[]'$ provides access for controlling wheelchair   '[]'$ lacks motor control to operate proportional drive control '[]'$ unable to understand proportional controls  Actuator Control Module  '[]'$ Single  '[]'$ Multiple   '[]'$ Allow the client to operate the power seat function(s) through the joystick control   '[]'$ Safety Reset Switches '[]'$ Used to change modes and stop the wheelchair when driving in latch mode    '[]'$ Upgraded Electronics   '[]'$ programming for accurate control '[]'$ progressive Disease/changing condition '[]'$ non-proportional drive control needed '[]'$ Needed in order to operate power seat functions through joystick control   '[]'$ Display box '[]'$ Allows user to see in which mode and drive the wheelchair is set  '[]'$ necessary for alternate controls    '[]'$ Digital interface electronics '[]'$ Allows w/c to operate when using alternative  drive controls  '[]'$ ASL Head Array '[]'$ Allows client to operate wheelchair  through switches placed in tri-panel headrest  '[]'$ Sip and puff with tubing kit '[]'$ needed to operate sip and puff drive controls  '[]'$ Upgraded tracking electronics '[]'$ increase safety when driving '[]'$ correct tracking when on uneven surfaces  '[]'$ Mount for switches or joystick '[]'$ Attaches switches to w/c  '[]'$ Swing away for access or transfers '[]'$ midline for optimal placement '[]'$ provides for consistent access  '[]'$ Attendant controlled joystick plus mount '[]'$ safety '[]'$ long distance driving '[]'$ operation of seat functions '[]'$ compliance with transportation regulations '[]'$  ?????    Rear wheel placement/Axle adjustability '[]'$ None '[]'$ semi adjustable '[x]'$ fully adjustable  '[x]'$ improved UE access to wheels '[]'$ improved stability '[]'$ changing angle in space for improvement of postural stability '[]'$ 1-arm drive access '[]'$ amputee pad placement '[]'$  ?????  Wheel rims/ hand rims  '[x]'$ metal  '[]'$ plastic coated '[]'$ oblique projections '[]'$ vertical projections '[x]'$ Provide ability to propel manual wheelchair  '[]'$  Increase self-propulsion with hand weakness/decreased grasp  Push handles '[]'$ extended  '[]'$ angle adjustable  '[x]'$ standard '[x]'$ caregiver access '[x]'$ caregiver assist '[]'$ allows "hooking" to enable increased ability to perform ADLs or maintain balance  One armed device  '[]'$ Lt   '[]'$ Rt '[]'$ enable propulsion of manual wheelchair with one arm   '[]'$  ?????   Brake/wheel lock extension '[x]'$  Lt   '[x]'$  Rt '[x]'$ increase indep in applying wheel locks   '[]'$ Side guards '[]'$ prevent clothing getting caught in wheel or becoming soiled '[]'$  prevent skin tears/abrasions  Battery: ????? '[]'$ to power wheelchair ?????  Other: ????? ????? ?????  The above equipment has a life- long use expectancy. Growth and changes in medical and/or functional conditions would be the exceptions. This is to certify that the therapist has no financial relationship with durable medical provider or manufacturer. The therapist will not  receive remuneration of any kind for the equipment recommended in this evaluation.   Patient has mobility limitation that significantly impairs safe, timely participation in one or more mobility related ADL's.  (bathing, toileting, feeding, dressing, grooming, moving from room to room)                                                             [  x] Yes '[]'$  No Will mobility device sufficiently improve ability to participate and/or be aided in participation of MRADL's?         '[x]'$  Yes '[]'$  No Can limitation be compensated for with use of a cane or walker?                                                                                '[]'$  Yes '[x]'$  No Does patient or caregiver demonstrate ability/potential ability & willingness to safely use the mobility device?   '[x]'$  Yes '[]'$  No Does patient's home environment support use of recommended mobility device?                                                    '[x]'$  Yes '[]'$  No Does patient have sufficient upper extremity function necessary to functionally propel a manual wheelchair?    '[x]'$  Yes '[]'$  No Does patient have sufficient strength and trunk stability to safely operate a POV (scooter)?                                  '[]'$  Yes '[x]'$  No Does patient need additional features/benefits provided by a power wheelchair for MRADL's in the home?       '[]'$  Yes '[x]'$  No Does the patient demonstrate the ability to safely use a power wheelchair?                                                              '[]'$  Yes '[]'$  No  Therapist Name Printed: ????? Date: ?????  Therapist's Signature:   Date:   Supplier's Name Printed: ????? Date: ?????  Supplier's Signature:   Date:  Patient/Caregiver Signature:   Date:     This is to certify that I have read this evaluation and do agree with the content within:    Physician's Name Printed: ?????  Physician's Signature:  Date:     This is to certify that I, the above signed therapist have the following affiliations: '[]'$  This DME  provider '[]'$  Manufacturer of recommended equipment '[]'$  Patient's long term care facility '[x]'$  None of the above   Miryah Ralls W. 10/24/2015, 2:18 PM Frazier Butt., PT    [facility] [address] [place], [place], [address] Phone: [phone]   Fax:  [phone]

## 2015-11-06 ENCOUNTER — Ambulatory Visit (INDEPENDENT_AMBULATORY_CARE_PROVIDER_SITE_OTHER): Payer: Medicare Other | Admitting: Internal Medicine

## 2015-11-06 ENCOUNTER — Encounter: Payer: Self-pay | Admitting: *Deleted

## 2015-11-06 ENCOUNTER — Encounter: Payer: Self-pay | Admitting: Internal Medicine

## 2015-11-06 DIAGNOSIS — I639 Cerebral infarction, unspecified: Secondary | ICD-10-CM

## 2015-11-06 NOTE — Progress Notes (Signed)
Patient ID: Wanda PrimesShirley A Lubke, female   DOB: 04/25/1956, 60 y.o.   MRN: [id number]  No show.

## 2015-11-06 NOTE — Progress Notes (Deleted)
Patient ID: Wanda [name], female   DOB: 06-10-1955, 60 y.o.   MRN: [id number]   Subjective:   Patient ID: Wanda [name] female   DOB: 26-Jun-1955 60 y.o.   MRN: [id number]  HPI: [name] is a 60 y.o. female with PMH as below, here for ***.  Please see Problem-Based charting for the status of the patient's chronic medical issues.     Past Medical History  Diagnosis Date  . Depression   . GERD (gastroesophageal reflux disease)   . Hypertension   . 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 SLE (systemic lupus erythematosus) (HCC)   . Venous insufficiency (chronic) (peripheral)   . Osteopenia   . ANEMIA-IRON DEFICIENCY 03/02/2006    H&H 8.5/25.9 5/09. On 5/13 12.8/39.7 with MCV 80.4.     Marland Kitchen CEREBROVASCULAR ACCIDENT, HX OF 03/02/2006    Ruptured berry aneurysm, right hemiparesis & expressive aphasia     . SVT (supraventricular tachycardia) (HCC)     a. 03/2013 with mildly elevated troponin at that time.   Current Outpatient Prescriptions  Medication Sig Dispense Refill  . albuterol (PROVENTIL) (2.5 MG/3ML) 0.083% nebulizer solution Take 3 mLs (2.5 mg total) by nebulization every 6 (six) hours as needed for wheezing or shortness of breath. 75 mL 12  . aspirin 81 MG tablet Take 81 mg by mouth daily.      Marland Kitchen atorvastatin (LIPITOR) 40 MG tablet Take 1 tablet (40 mg total) by mouth daily. 30 tablet 3  . Blood Pressure Monitoring (BLOOD PRESSURE CUFF) MISC Does not apply route 1 each 0  . calcium-vitamin D (OSCAL) 250-125 MG-UNIT per tablet Take 1 tablet by mouth daily.      . Cholecalciferol (REPLESTA) 50000 UNITS WAFR Take 50,000 Units by mouth every 7 (seven) days. On wednesdays    . cyclobenzaprine (FLEXERIL) 5 MG tablet TAKE 1 TABLET BY MOUTH AT BEDTIME AS NEEDED FOR MUSCLE SPASMS 30 tablet 3  . denosumab (PROLIA) 60 MG/ML SOLN injection Inject 60 mg into the skin every 6 (six) months. Administer in upper arm, thigh, or abdomen  0  . gabapentin  (NEURONTIN) 300 MG capsule Take 1 capsule (300 mg total) by mouth 3 (three) times daily. 270 capsule 3  . hydrochlorothiazide (HYDRODIURIL) 25 MG tablet Take 1 tablet (25 mg total) by mouth daily. 90 tablet 3  . HYDROcodone-acetaminophen (NORCO/VICODIN) 5-325 MG per tablet Take 1 tablet by mouth every 6 (six) hours as needed for moderate pain. 60 tablet 0  . ipratropium (ATROVENT) 0.02 % nebulizer solution Take 2.5 mLs (0.5 mg total) by nebulization every 4 (four) hours as needed for wheezing or shortness of breath. 62.5 mL 2  . levETIRAcetam (KEPPRA) 500 MG tablet Take 1 tablet (500 mg total) by mouth 2 (two) times daily. 180 tablet 3  . lisinopril (PRINIVIL,ZESTRIL) 10 MG tablet Take 1 tablet (10 mg total) by mouth daily. 30 tablet 3  . loratadine (CLARITIN) 10 MG tablet TAKE 1 TABLET (10 MG TOTAL) BY MOUTH DAILY. 30 tablet 6  . metoCLOPramide (REGLAN) 5 MG tablet Take 1 tablet (5 mg total) by mouth every 6 (six) hours as needed for nausea. 360 tablet 0  . metoprolol (LOPRESSOR) 50 MG tablet Take 1.5 tablets (75 mg total) by mouth 2 (two) times daily. 270 tablet 3  . Multiple Vitamins-Minerals (ADULT ONE DAILY GUMMIES) CHEW Chew 2 each by mouth daily.    Marland Kitchen omeprazole (PRILOSEC) 20 MG capsule Take 1 capsule (20  mg total) by mouth daily. 90 capsule 0  . oxyCODONE-acetaminophen (PERCOCET) 5-325 MG tablet Take 1 tablet by mouth every 6 (six) hours as needed for severe pain. 6 tablet 0  . polyethylene glycol (MIRALAX / GLYCOLAX) packet Take 17 g by mouth daily as needed. 100 each 0  . potassium chloride (K-DUR) 10 MEQ tablet Take 1 tablet (10 mEq total) by mouth 2 (two) times daily. 180 tablet 3  . promethazine (PHENERGAN) 12.5 MG tablet Take 1 tablet (12.5 mg total) by mouth every 8 (eight) hours as needed for nausea. 45 tablet 2  . senna-docusate (SENOKOT-S) 8.6-50 MG tablet Take 2 tablets by mouth 2 (two) times daily. 120 tablet 3  . sulfamethoxazole-trimethoprim (BACTRIM DS,SEPTRA DS) 800-160 MG  tablet Take 1 tablet by mouth 2 (two) times daily. 14 tablet 0  . venlafaxine XR (EFFEXOR-XR) 75 MG 24 hr capsule Take 1 capsule (75 mg total) by mouth daily with breakfast. 90 capsule 3   No current facility-administered medications for this visit.   Family History  Problem Relation Age of Onset  . Heart attack Brother 1460   Social History   Social History  . Marital Status: Widowed    Spouse Name: N/A  . Number of Children: N/A  . Years of Education: N/A   Social History Main Topics  . Smoking status: Former Smoker -- 1.50 packs/day    Types: Cigarettes    Quit date: 05/30/2003  . Smokeless tobacco: Never Used     Comment: she smoked for 30 years before she quit  . Alcohol Use: No  . Drug Use: No  . Sexual Activity: Not Asked   Other Topics Concern  . None   Social History Narrative   Has a Lexicographerhome nurse,    Lives with her 2 sons,    Former smoker   Widow         Review of Systems: {Review Of Systems:30496} Objective:  Physical Exam: Filed Vitals:   {Exam, Complete:17964} Assessment & Plan:   Patient and case were discussed with Dr. Marland Kitchen***.  Please refer to Problem Based charting for further documentation.

## 2015-11-07 ENCOUNTER — Encounter: Payer: Self-pay | Admitting: Internal Medicine

## 2015-11-07 ENCOUNTER — Ambulatory Visit (INDEPENDENT_AMBULATORY_CARE_PROVIDER_SITE_OTHER): Payer: Medicare Other | Admitting: Internal Medicine

## 2015-11-07 VITALS — BP 117/68 | HR 72 | Temp 98.0°F | Wt 207.7 lb

## 2015-11-07 DIAGNOSIS — M62431 Contracture of muscle, right forearm: Secondary | ICD-10-CM | POA: Diagnosis not present

## 2015-11-07 DIAGNOSIS — I639 Cerebral infarction, unspecified: Secondary | ICD-10-CM

## 2015-11-07 DIAGNOSIS — Z79891 Long term (current) use of opiate analgesic: Secondary | ICD-10-CM | POA: Diagnosis not present

## 2015-11-07 DIAGNOSIS — M62421 Contracture of muscle, right upper arm: Secondary | ICD-10-CM | POA: Diagnosis not present

## 2015-11-07 DIAGNOSIS — L209 Atopic dermatitis, unspecified: Secondary | ICD-10-CM | POA: Diagnosis not present

## 2015-11-07 DIAGNOSIS — I69398 Other sequelae of cerebral infarction: Secondary | ICD-10-CM | POA: Diagnosis not present

## 2015-11-07 DIAGNOSIS — I69351 Hemiplegia and hemiparesis following cerebral infarction affecting right dominant side: Secondary | ICD-10-CM

## 2015-11-07 DIAGNOSIS — Z7982 Long term (current) use of aspirin: Secondary | ICD-10-CM | POA: Diagnosis not present

## 2015-11-07 DIAGNOSIS — R21 Rash and other nonspecific skin eruption: Secondary | ICD-10-CM

## 2015-11-07 MED ORDER — CYCLOBENZAPRINE HCL 5 MG PO TABS: 5.0000 mg | ORAL_TABLET | Freq: Three times a day (TID) | ORAL | Status: DC | PRN

## 2015-11-07 NOTE — Assessment & Plan Note (Signed)
Patient complaining of continued rash, previously biopsied showing spongiotic dermatitis c/w eczema.  Rash is only minimally itchy, but spreading.  Exam shows plaques of peeling skin, but no cracking or noticeably thickened plaques.    A/P: Atopic dermatitis. Will attempt conservative treatment with ointment rehydration before initiating topical steroids, as patient is nonmobile and overweight and would likely have opposing skin in some of the affected areas.  If no improvement with Vaseline, can try low dose topical steroid at next visit. - Vaseline AAA BID - RTC 1 month

## 2015-11-07 NOTE — Progress Notes (Signed)
Patient ID: Wanda [name], female   DOB: 05/18/1955, 60 y.o.   MRN: [id number]   Subjective:   Patient ID: Wanda PrimesShirley A [name] female   DOB: 08/14/1955 60 y.o.   MRN: [id number]  HPI: [name] is a 60 y.o. female with PMH as below, here for mobility assessment for an ultra lightweight manual wheelchair.  Please see Problem-Based charting for the status of the patient's chronic medical issues.  [name] is a 60 yo female with history of stroke and right arm contracture.  She has been assessed for a need for ultra lightweight manual wheelchair.  I have read and agree with the Physical Therapy evaluation.  Patient needs a ultra lightweight wheelchair because she is unable to move a regular weighted wheelchair and this will allow her to propel herself unassisted.  This will allow her to accomplish her ADLs by getting around the house more safely, though she will still require assistance for most tasks.    Currently, her arm remains in contracture and right leg pain from previous CVA.  It is worsened when attempting transfers, and is rated 8-9, and sometimes unbearable.    Past Medical History  Diagnosis Date  . Depression   . GERD (gastroesophageal reflux disease)   . Hypertension   . 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 SLE (systemic lupus erythematosus) (HCC)   . Venous insufficiency (chronic) (peripheral)   . Osteopenia   . ANEMIA-IRON DEFICIENCY 03/02/2006    H&H 8.5/25.9 5/09. On 5/13 12.8/39.7 with MCV 80.4.     Marland Kitchen. CEREBROVASCULAR ACCIDENT, HX OF 03/02/2006    Ruptured berry aneurysm, right hemiparesis & expressive aphasia     . SVT (supraventricular tachycardia) (HCC)     a. 03/2013 with mildly elevated troponin at that time.   Current Outpatient Prescriptions  Medication Sig Dispense Refill  . albuterol (PROVENTIL) (2.5 MG/3ML) 0.083% nebulizer solution Take 3 mLs (2.5 mg total) by nebulization every 6 (six) hours as needed for wheezing or shortness  of breath. 75 mL 12  . aspirin 81 MG tablet Take 81 mg by mouth daily.      Marland Kitchen. atorvastatin (LIPITOR) 40 MG tablet Take 1 tablet (40 mg total) by mouth daily. 30 tablet 3  . Blood Pressure Monitoring (BLOOD PRESSURE CUFF) MISC Does not apply route 1 each 0  . calcium-vitamin D (OSCAL) 250-125 MG-UNIT per tablet Take 1 tablet by mouth daily.      . Cholecalciferol (REPLESTA) 50000 UNITS WAFR Take 50,000 Units by mouth every 7 (seven) days. On wednesdays    . cyclobenzaprine (FLEXERIL) 5 MG tablet TAKE 1 TABLET BY MOUTH AT BEDTIME AS NEEDED FOR MUSCLE SPASMS 30 tablet 3  . denosumab (PROLIA) 60 MG/ML SOLN injection Inject 60 mg into the skin every 6 (six) months. Administer in upper arm, thigh, or abdomen  0  . gabapentin (NEURONTIN) 300 MG capsule Take 1 capsule (300 mg total) by mouth 3 (three) times daily. 270 capsule 3  . hydrochlorothiazide (HYDRODIURIL) 25 MG tablet Take 1 tablet (25 mg total) by mouth daily. 90 tablet 3  . HYDROcodone-acetaminophen (NORCO/VICODIN) 5-325 MG per tablet Take 1 tablet by mouth every 6 (six) hours as needed for moderate pain. 60 tablet 0  . ipratropium (ATROVENT) 0.02 % nebulizer solution Take 2.5 mLs (0.5 mg total) by nebulization every 4 (four) hours as needed for wheezing or shortness of breath. 62.5 mL 2  . levETIRAcetam (KEPPRA) 500 MG tablet Take 1  tablet (500 mg total) by mouth 2 (two) times daily. 180 tablet 3  . lisinopril (PRINIVIL,ZESTRIL) 10 MG tablet Take 1 tablet (10 mg total) by mouth daily. 30 tablet 3  . loratadine (CLARITIN) 10 MG tablet TAKE 1 TABLET (10 MG TOTAL) BY MOUTH DAILY. 30 tablet 6  . metoCLOPramide (REGLAN) 5 MG tablet Take 1 tablet (5 mg total) by mouth every 6 (six) hours as needed for nausea. 360 tablet 0  . metoprolol (LOPRESSOR) 50 MG tablet Take 1.5 tablets (75 mg total) by mouth 2 (two) times daily. 270 tablet 3  . Multiple Vitamins-Minerals (ADULT ONE DAILY GUMMIES) CHEW Chew 2 each by mouth daily.    Marland Kitchen. omeprazole (PRILOSEC) 20  MG capsule Take 1 capsule (20 mg total) by mouth daily. 90 capsule 0  . oxyCODONE-acetaminophen (PERCOCET) 5-325 MG tablet Take 1 tablet by mouth every 6 (six) hours as needed for severe pain. 6 tablet 0  . polyethylene glycol (MIRALAX / GLYCOLAX) packet Take 17 g by mouth daily as needed. 100 each 0  . potassium chloride (K-DUR) 10 MEQ tablet Take 1 tablet (10 mEq total) by mouth 2 (two) times daily. 180 tablet 3  . promethazine (PHENERGAN) 12.5 MG tablet Take 1 tablet (12.5 mg total) by mouth every 8 (eight) hours as needed for nausea. 45 tablet 2  . senna-docusate (SENOKOT-S) 8.6-50 MG tablet Take 2 tablets by mouth 2 (two) times daily. 120 tablet 3  . sulfamethoxazole-trimethoprim (BACTRIM DS,SEPTRA DS) 800-160 MG tablet Take 1 tablet by mouth 2 (two) times daily. 14 tablet 0  . venlafaxine XR (EFFEXOR-XR) 75 MG 24 hr capsule Take 1 capsule (75 mg total) by mouth daily with breakfast. 90 capsule 3   No current facility-administered medications for this visit.   Family History  Problem Relation Age of Onset  . Heart attack Brother 8160   Social History   Social History  . Marital Status: Widowed    Spouse Name: N/A  . Number of Children: N/A  . Years of Education: N/A   Social History Main Topics  . Smoking status: Former Smoker -- 1.50 packs/day    Types: Cigarettes    Quit date: 05/30/2003  . Smokeless tobacco: Never Used     Comment: she smoked for 30 years before she quit  . Alcohol Use: No  . Drug Use: No  . Sexual Activity: Not Asked   Other Topics Concern  . None   Social History Narrative   Has a home nurse,    Lives with her 2 sons,    Former smoker   Widow         Review of Systems: No fevers, chills, CP, or SOB.  Endorses right arm and leg pain and muscle spasms. Objective:  Physical Exam: Filed Vitals:   11/07/15 1015  BP: 117/68  Pulse: 72  Temp: 98 F (36.7 C)  TempSrc: Oral  Weight: 207 lb 11.2 oz (94.212 kg)  SpO2: 96%   Physical Exam    Constitutional: She is oriented to person, place, and time and well-developed, well-nourished, and in no distress. No distress.  HENT:  Head: Normocephalic and atraumatic.  Eyes: EOM are normal. No scleral icterus.  Neck: No tracheal deviation present.  Cardiovascular: Normal rate, regular rhythm and normal heart sounds.   Pulmonary/Chest: Effort normal and breath sounds normal. No stridor. No respiratory distress. She has no wheezes.  Neurological: She is alert and oriented to person, place, and time.  Unable to articulate full words.  Right arm contracted.  Unable to move right arm or leg.  Skin: Skin is warm and dry. She is not diaphoretic.  Peeling skin over bilateral tibia.  Minimally itchy.     Assessment & Plan:   Patient and case were discussed with Dr. Cyndie Chime.  Please refer to Problem Based charting for further documentation.

## 2015-11-07 NOTE — Patient Instructions (Signed)
1. Apply copious Vaseline to areas of dry skin TWICE daily. 2. Increase Cyclobenzaprine to 5 mg THREE times daily as needed for muscle spasm. 3. Return to clinic in 1 month.  Eczema Eczema, also called atopic dermatitis, is a skin disorder that causes inflammation of the skin. It causes a red rash and dry, scaly skin. The skin becomes very itchy. Eczema is generally worse during the cooler winter months and often improves with the warmth of summer. Eczema usually starts showing signs in infancy. Some children outgrow eczema, but it may last through adulthood.  CAUSES  The exact cause of eczema is not known, but it appears to run in families. People with eczema often have a family history of eczema, allergies, asthma, or hay fever. Eczema is not contagious. Flare-ups of the condition may be caused by:   Contact with something you are sensitive or allergic to.   Stress. SIGNS AND SYMPTOMS  Dry, scaly skin.   Red, itchy rash.   Itchiness. This may occur before the skin rash and may be very intense.  DIAGNOSIS  The diagnosis of eczema is usually made based on symptoms and medical history. TREATMENT  Eczema cannot be cured, but symptoms usually can be controlled with treatment and other strategies. A treatment plan might include:  Controlling the itching and scratching.   Use over-the-counter antihistamines as directed for itching. This is especially useful at night when the itching tends to be worse.   Use over-the-counter steroid creams as directed for itching.   Avoid scratching. Scratching makes the rash and itching worse. It may also result in a skin infection (impetigo) due to a break in the skin caused by scratching.   Keeping the skin well moisturized with creams every day. This will seal in moisture and help prevent dryness. Lotions that contain alcohol and water should be avoided because they can dry the skin.   Limiting exposure to things that you are sensitive or  allergic to (allergens).   Recognizing situations that cause stress.   Developing a plan to manage stress.  HOME CARE INSTRUCTIONS   Only take over-the-counter or prescription medicines as directed by your health care provider.   Do not use anything on the skin without checking with your health care provider.   Keep baths or showers short (5 minutes) in warm (not hot) water. Use mild cleansers for bathing. These should be unscented. You may add nonperfumed bath oil to the bath water. It is best to avoid soap and bubble bath.   Immediately after a bath or shower, when the skin is still damp, apply a moisturizing ointment to the entire body. This ointment should be a petroleum ointment. This will seal in moisture and help prevent dryness. The thicker the ointment, the better. These should be unscented.   Keep fingernails cut short. Children with eczema may need to wear soft gloves or mittens at night after applying an ointment.   Dress in clothes made of cotton or cotton blends. Dress lightly, because heat increases itching.   A child with eczema should stay away from anyone with fever blisters or cold sores. The virus that causes fever blisters (herpes simplex) can cause a serious skin infection in children with eczema. SEEK MEDICAL CARE IF:   Your itching interferes with sleep.   Your rash gets worse or is not better within 1 week after starting treatment.   You see pus or soft yellow scabs in the rash area.   You have a fever.  You have a rash flare-up after contact with someone who has fever blisters.    This information is not intended to replace advice given to you by your health care provider. Make sure you discuss any questions you have with your health care provider.   Document Released: 05/01/2000 Document Revised: 02/22/2013 Document Reviewed: 12/05/2012 Elsevier Interactive Patient Education 2016 [employer]  

## 2015-11-07 NOTE — Assessment & Plan Note (Addendum)
[name] is a 60 yo female with history of stroke and right arm contracture.  She has been assessed for a need for ultra lightweight manual wheelchair.  I have read and agree with the Physical Therapy evaluation.  Patient needs a ultra lightweight wheelchair because she is unable to move a regular weighted wheelchair and this will allow her to propel herself unassisted.  This will allow her to accomplish her ADLs by getting around the house more safely, though she will still require assistance for most tasks.    Currently, her arm remains in contracture and right leg pain from previous CVA.  It is worsened when attempting transfers, and is rated 8-9, and sometimes unbearable.   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  Patient unable to ambulate without wheelchair, and needs ultra lightweight wheelchair in order to move herself around her home.  I agree with the PT evaluation.  Will also increase Flexeril dosing to help alleviate her muscle spasms. - Ultra Lightweight Wheelchair - Flexeril 5 mg TID PRN spasm

## 2015-11-07 NOTE — Progress Notes (Signed)
Medicine attending: Medical history, presenting problems, physical findings, and medications, reviewed with resident physician Dr Nicholas Taylor on the day of the patient visit and I concur with his evaluation and management plan. 

## 2015-11-12 ENCOUNTER — Ambulatory Visit: Payer: Medicare Other | Admitting: Sports Medicine

## 2015-11-28 ENCOUNTER — Other Ambulatory Visit: Payer: Self-pay | Admitting: Internal Medicine

## 2015-12-03 ENCOUNTER — Ambulatory Visit: Payer: Medicare Other | Admitting: Sports Medicine

## 2015-12-10 ENCOUNTER — Encounter: Payer: Self-pay | Admitting: Sports Medicine

## 2015-12-10 ENCOUNTER — Ambulatory Visit (INDEPENDENT_AMBULATORY_CARE_PROVIDER_SITE_OTHER): Payer: Medicare Other | Admitting: Sports Medicine

## 2015-12-10 DIAGNOSIS — M79672 Pain in left foot: Secondary | ICD-10-CM | POA: Diagnosis not present

## 2015-12-10 DIAGNOSIS — B351 Tinea unguium: Secondary | ICD-10-CM

## 2015-12-10 DIAGNOSIS — M79671 Pain in right foot: Secondary | ICD-10-CM | POA: Diagnosis not present

## 2015-12-10 DIAGNOSIS — Z8673 Personal history of transient ischemic attack (TIA), and cerebral infarction without residual deficits: Secondary | ICD-10-CM

## 2015-12-10 NOTE — Progress Notes (Signed)
Patient ID: Wanda [name], female   DOB: [birth year], 58 y.o.   MRN: [id number] Subjective: Wanda [name] is a 60 y.o. female patient seen today in office with complaint of painful thickened and elongated toenails; unable to trim. Patient has history of stroke. Patient is assisted by son who states that his mother has no other pedal complaints at this time.   Patient Active Problem List   Diagnosis Date Noted  . Rash and nonspecific skin eruption 09/12/2015  . Constipation 07/05/2015  . Neck muscle strain 05/09/2015  . Diastolic dysfunction 08/31/2013  . Right hemiparesis (HCC) 07/18/2013  . SVT (supraventricular tachycardia) (HCC) 03/18/2013  . Need for prophylactic vaccination with combined diphtheria-tetanus-pertussis (DTP) vaccine 09/21/2012  . Seasonal allergies 09/21/2012  . Osteoporosis, unspecified 09/21/2012  . Routine adult health maintenance 11/27/2010  . INSOMNIA 09/19/2009  . Bilateral lower extremity edema 12/25/2008  . Asthma 07/13/2006  . ANEMIA-IRON DEFICIENCY 03/02/2006  . DEPRESSION 03/02/2006  . Essential hypertension 03/02/2006  . GERD 03/02/2006  . SEIZURE DISORDER 03/02/2006  . CVA (cerebral infarction) 03/02/2006    Current Outpatient Prescriptions on File Prior to Visit  Medication Sig Dispense Refill  . albuterol (PROVENTIL) (2.5 MG/3ML) 0.083% nebulizer solution Take 3 mLs (2.5 mg total) by nebulization every 6 (six) hours as needed for wheezing or shortness of breath. 75 mL 12  . aspirin 81 MG tablet Take 81 mg by mouth daily.      Marland Kitchen atorvastatin (LIPITOR) 40 MG tablet Take 1 tablet (40 mg total) by mouth daily. 30 tablet 3  . Blood Pressure Monitoring (BLOOD PRESSURE CUFF) MISC Does not apply route 1 each 0  . calcium-vitamin D (OSCAL) 250-125 MG-UNIT per tablet Take 1 tablet by mouth daily.      . Cholecalciferol (REPLESTA) 50000 UNITS WAFR Take 50,000 Units by mouth every 7 (seven) days. On wednesdays    . cyclobenzaprine (FLEXERIL) 5 MG tablet  Take 1 tablet (5 mg total) by mouth 3 (three) times daily as needed for muscle spasms. 90 tablet 3  . denosumab (PROLIA) 60 MG/ML SOLN injection Inject 60 mg into the skin every 6 (six) months. Administer in upper arm, thigh, or abdomen  0  . gabapentin (NEURONTIN) 300 MG capsule Take 1 capsule (300 mg total) by mouth 3 (three) times daily. 270 capsule 3  . hydrochlorothiazide (HYDRODIURIL) 25 MG tablet Take 1 tablet (25 mg total) by mouth daily. 90 tablet 3  . HYDROcodone-acetaminophen (NORCO/VICODIN) 5-325 MG per tablet Take 1 tablet by mouth every 6 (six) hours as needed for moderate pain. 60 tablet 0  . ipratropium (ATROVENT) 0.02 % nebulizer solution Take 2.5 mLs (0.5 mg total) by nebulization every 4 (four) hours as needed for wheezing or shortness of breath. 62.5 mL 2  . levETIRAcetam (KEPPRA) 500 MG tablet Take 1 tablet (500 mg total) by mouth 2 (two) times daily. 180 tablet 3  . lisinopril (PRINIVIL,ZESTRIL) 10 MG tablet Take 1 tablet (10 mg total) by mouth daily. 90 tablet 3  . loratadine (CLARITIN) 10 MG tablet TAKE 1 TABLET (10 MG TOTAL) BY MOUTH DAILY. 30 tablet 6  . metoCLOPramide (REGLAN) 5 MG tablet Take 1 tablet (5 mg total) by mouth every 6 (six) hours as needed for nausea. 360 tablet 0  . metoprolol (LOPRESSOR) 50 MG tablet Take 1.5 tablets (75 mg total) by mouth 2 (two) times daily. 270 tablet 3  . Multiple Vitamins-Minerals (ADULT ONE DAILY GUMMIES) CHEW Chew 2 each by mouth daily.    Marland Kitchen  omeprazole (PRILOSEC) 20 MG capsule Take 1 capsule (20 mg total) by mouth daily. 90 capsule 0  . oxyCODONE-acetaminophen (PERCOCET) 5-325 MG tablet Take 1 tablet by mouth every 6 (six) hours as needed for severe pain. 6 tablet 0  . polyethylene glycol (MIRALAX / GLYCOLAX) packet Take 17 g by mouth daily as needed. 100 each 0  . potassium chloride (K-DUR) 10 MEQ tablet Take 1 tablet (10 mEq total) by mouth 2 (two) times daily. 180 tablet 3  . promethazine (PHENERGAN) 12.5 MG tablet Take 1 tablet  (12.5 mg total) by mouth every 8 (eight) hours as needed for nausea. 45 tablet 2  . senna-docusate (SENOKOT-S) 8.6-50 MG tablet Take 2 tablets by mouth 2 (two) times daily. 120 tablet 3  . sulfamethoxazole-trimethoprim (BACTRIM DS,SEPTRA DS) 800-160 MG tablet Take 1 tablet by mouth 2 (two) times daily. 14 tablet 0  . venlafaxine XR (EFFEXOR-XR) 75 MG 24 hr capsule Take 1 capsule (75 mg total) by mouth daily with breakfast. 90 capsule 3   No current facility-administered medications on file prior to visit.     Allergies  Allergen Reactions  . Lasix [Furosemide] Swelling    mild L facial swelling  . Penicillins Swelling  . Sulfur Itching    Objective: Physical Exam  General: Well developed, nourished, no acute distress, awake, alert and oriented x 3  Vascular: Dorsalis pedis artery 0/4 bilateral, Posterior tibial artery 1/4 bilateral, skin temperature warm to warm proximal to distal bilateral lower extremities, no varicosities, no pedal hair present bilateral. Trace edema right>left.  Neurological: Gross sensation present via light touch bilateral. Patient is hyper-sensitive bilateral.   Dermatological: Skin is warm, dry, and supple bilateral, Nails 1-10 are tender, long, thick, and discolored with moderate subungal debris, no webspace macerations present bilateral, no open lesions present bilateral, no callus/corns/hyperkeratotic tissue present bilateral. No signs of infection bilateral.  Musculoskeletal:Bunion and hammertoe boney deformities noted bilateral. Muscular strength within normal limits without pain on range of motion on left, 4/5 on right. No pain with calf compression bilateral.  Assessment and Plan:  Problem List Items Addressed This Visit    None    Visit Diagnoses    Dermatophytosis of nail    -  Primary   Foot pain, bilateral       History of stroke         -Examined patient.  -Discussed treatment options for painful mycotic nails. -Mechanically debrided and  reduced mycotic nails with sterile nail nipper and dremel nail file without incident. -Patient to return in 3 months for follow up evaluation or sooner if symptoms worsen.  Asencion Islam, DPM

## 2015-12-31 ENCOUNTER — Other Ambulatory Visit: Payer: Self-pay | Admitting: *Deleted

## 2015-12-31 DIAGNOSIS — I471 Supraventricular tachycardia: Secondary | ICD-10-CM

## 2015-12-31 DIAGNOSIS — I10 Essential (primary) hypertension: Secondary | ICD-10-CM

## 2015-12-31 NOTE — Telephone Encounter (Signed)
Received faxed refill request from pt's pharmacy stating "this prescription was filled today (12/30/2015).  Any refills authorized will be placed on file".  Request is for additional refills.  Will send to pcp for review-pt has a clinic appt on 01/23/2016.  Please advise.Kingsley SpittleGoldston, Raghad Lorenz Cassady8/15/201712:30 PM

## 2016-01-04 MED ORDER — POTASSIUM CHLORIDE ER 10 MEQ PO TBCR: 10.0000 meq | EXTENDED_RELEASE_TABLET | Freq: Two times a day (BID) | ORAL | 0 refills | Status: DC

## 2016-01-04 MED ORDER — METOPROLOL TARTRATE 50 MG PO TABS: 75.0000 mg | ORAL_TABLET | Freq: Two times a day (BID) | ORAL | 0 refills | Status: DC

## 2016-01-09 ENCOUNTER — Other Ambulatory Visit: Payer: Self-pay | Admitting: *Deleted

## 2016-01-13 MED ORDER — VENLAFAXINE HCL ER 75 MG PO CP24: 75.0000 mg | ORAL_CAPSULE | Freq: Every day | ORAL | 3 refills | Status: DC

## 2016-01-15 ENCOUNTER — Encounter: Payer: Self-pay | Admitting: *Deleted

## 2016-01-23 ENCOUNTER — Encounter: Payer: Medicare Other | Admitting: Internal Medicine

## 2016-01-25 ENCOUNTER — Other Ambulatory Visit: Payer: Self-pay | Admitting: Internal Medicine

## 2016-01-25 DIAGNOSIS — I639 Cerebral infarction, unspecified: Secondary | ICD-10-CM

## 2016-01-28 ENCOUNTER — Other Ambulatory Visit: Payer: Self-pay | Admitting: *Deleted

## 2016-01-28 DIAGNOSIS — G40909 Epilepsy, unspecified, not intractable, without status epilepticus: Secondary | ICD-10-CM

## 2016-01-29 MED ORDER — LEVETIRACETAM 500 MG PO TABS: 500.0000 mg | ORAL_TABLET | Freq: Two times a day (BID) | ORAL | 3 refills | Status: DC

## 2016-01-31 ENCOUNTER — Ambulatory Visit (INDEPENDENT_AMBULATORY_CARE_PROVIDER_SITE_OTHER): Payer: Medicare Other | Admitting: Internal Medicine

## 2016-01-31 VITALS — BP 129/72 | HR 78 | Temp 98.2°F | Ht 69.0 in | Wt 213.4 lb

## 2016-01-31 DIAGNOSIS — G8191 Hemiplegia, unspecified affecting right dominant side: Secondary | ICD-10-CM

## 2016-01-31 DIAGNOSIS — M25552 Pain in left hip: Secondary | ICD-10-CM

## 2016-01-31 DIAGNOSIS — Z87891 Personal history of nicotine dependence: Secondary | ICD-10-CM

## 2016-01-31 DIAGNOSIS — L304 Erythema intertrigo: Secondary | ICD-10-CM

## 2016-01-31 DIAGNOSIS — R21 Rash and other nonspecific skin eruption: Secondary | ICD-10-CM | POA: Diagnosis not present

## 2016-01-31 DIAGNOSIS — Z7982 Long term (current) use of aspirin: Secondary | ICD-10-CM | POA: Diagnosis not present

## 2016-01-31 DIAGNOSIS — I69351 Hemiplegia and hemiparesis following cerebral infarction affecting right dominant side: Secondary | ICD-10-CM

## 2016-01-31 MED ORDER — DICLOFENAC SODIUM 1 % TD GEL: 2.0000 g | Freq: Four times a day (QID) | TRANSDERMAL | 2 refills | Status: DC

## 2016-01-31 MED ORDER — NYSTATIN 100000 UNIT/GM EX POWD: Freq: Four times a day (QID) | CUTANEOUS | 0 refills | Status: DC

## 2016-01-31 NOTE — Patient Instructions (Signed)
Thank you for coming to see me today. It was a pleasure. Today we talked about:   Hip Pain: 1) use Voltaren gel topically to help with pain 2) also use tylenol and aleve as directed on the prescription bottle.  You can alternate between using the 2. 3) physical therapy will be ordered 4) alternate with heat and ice 5) we will order a hoyer lift  Rash under breast and groin: 1) please keep the area dry 2) use nystatin powder  Please follow-up with us in 3 weeks  If you have any questions or concerns, please do not hesitate to call the office at [phone].  Take Care,   Wanda BurlyAndrew Mckena Chern, DO

## 2016-01-31 NOTE — Progress Notes (Addendum)
CC: left hip pain  HPI:  [name] is a 60 y.o. woman with a past medical history listed below here today for left hip pain.  Symptoms began about 2 weeks ago and are located around her posterior left hip.  She denies low back pain.  Pain is described as sharp but she is unable to explain other symptoms.  Her son thinks she complains of more pain when lying in bed.  States the pain will come and go for about 2 minutes at a time and occur frequently throughout the day.  She has right sided hemiparesis from her stroke and her caregiver is concerned of overuse on the left side causing her symptoms.  They have been sporadically using tylenol and NSAIDs with little relief.  She uses her gabapentin and flexeril as prescribed. Her care taker reports bathing her daily and watching her skin for any breakdown.  They are also concerned about a rash in her left groin area and under her left breast that they use vaseline on.  For details of today's visit and the status of her chronic medical issues please refer to the assessment and plan.   Past Medical History:  Diagnosis Date  . ANEMIA-IRON DEFICIENCY 03/02/2006   H&H 8.5/25.9 5/09. On 5/13 12.8/39.7 with MCV 80.4.     Marland Kitchen. 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 Depression   . GERD (gastroesophageal reflux disease)   . Hypertension   . Osteopenia   . 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 SLE (systemic lupus erythematosus) (HCC)   . SVT (supraventricular tachycardia) (HCC)    a. 03/2013 with mildly elevated troponin at that time.  . Venous insufficiency (chronic) (peripheral)     Review of Systems:   Reports left hip pain, contracture pain in her right arm, muscle spasms, and right leg pain. Denies fever, chills, CP, SOB, dysuria, abdominal pain.  Physical Exam:  Vitals:   01/31/16 1336  BP: 129/72  Pulse: 78  Temp: 98.2 F (36.8 C)    TempSrc: Oral  SpO2: 99%  Weight: 213 lb 6.4 oz (96.8 kg)  Height: 5\' 9"  (1.753 m)   Physical Exam  Constitutional: She is oriented to person, place, and time.  HENT:  Head: Normocephalic and atraumatic.  Eyes: EOM are normal.  Musculoskeletal:  She does not have any tenderness over her lateral thigh, lumbar spine, or hip on the left side.    Neurological: She is alert and oriented to person, place, and time.  Unable to articulate full words.  Right arm contracted.  Unable to move right arm or leg.   Skin: Rash (Candidal intertrigo appearing infection under left breast and groin area.) noted.    Assessment & Plan:   See Encounters Tab for problem based charting.  Patient discussed with Dr. Heide SparkNarendra.  Intertrigo A: Exam consistent with candidal intertrigo.  P: - Nystatin powder - keep area dry - advised to not use vaseline in this area  Posterior pain of left hip A: Left hip pain may be secondary to overuse and compensation as a result of her right-sided hemiparesis.  P: - will attempt conservative management right now with voltaren gel, alternate heat and ice as tolerated. - regular tylenol and ibuprofen for the next few days for symptomatic relief. - referral to physical therapy. - continue gabapentin and Flexeril as already doing. Michiel Sites- Hoyer lift ordered to  help with transferring and off-loading. - RTC 2-3 weeks if not improving.  With her history of osteopenia, she may benefit from imaging at that point to assess for compression or other fracture.  Right hemiparesis A: Patient has a history of right-sided hemiparesis and contracture of her right arm secondary to her history of CVA.  This leaves her unable to use her right arm or leg.  Her caregiver feels that this lack of utility on the right has resulted in overuse on the left which may be contributing to her left hip pain.  They have utilized phyiscal therapy in the past and found it beneficial to the patient.  They  are also asking for a DME order for [facility] lift to help with transferring.  P: - DME order for hoyer lift - PT referral for evaluation and treatment of left hip pain associated with right sided deficits.

## 2016-01-31 NOTE — Assessment & Plan Note (Addendum)
A: Left hip pain may be secondary to overuse and compensation as a result of her right-sided hemiparesis.  P: - will attempt conservative management right now with voltaren gel, alternate heat and ice as tolerated. - regular tylenol and ibuprofen for the next few days for symptomatic relief. - referral to physical therapy. - continue gabapentin and Flexeril as already doing. - Hoyer lift ordered to help with transferring and off-loading. - RTC 2-3 weeks if not improving.  With her history of osteopenia, she may benefit from imaging at that point to assess for compression or other fracture.

## 2016-01-31 NOTE — Assessment & Plan Note (Signed)
A: Exam consistent with candidal intertrigo.  P: - Nystatin powder - keep area dry - advised to not use vaseline in this area

## 2016-02-03 NOTE — Assessment & Plan Note (Signed)
A: Patient has a history of right-sided hemiparesis and contracture of her right arm secondary to her history of CVA.  This leaves her unable to use her right arm or leg.  Her caregiver feels that this lack of utility on the right has resulted in overuse on the left which may be contributing to her left hip pain.  They have utilized phyiscal therapy in the past and found it beneficial to the patient.  [facility].  P: - DME order for hoyer lift - PT referral for evaluation and treatment of left hip pain associated with right sided deficits.

## 2016-02-03 NOTE — Progress Notes (Signed)
Internal Medicine Clinic Attending  Case discussed with Dr. Wallace at the time of the visit.  We reviewed the resident's history and exam and pertinent patient test results.  I agree with the assessment, diagnosis, and plan of care documented in the resident's note.  

## 2016-02-11 ENCOUNTER — Encounter: Payer: Self-pay | Admitting: *Deleted

## 2016-02-20 ENCOUNTER — Other Ambulatory Visit: Payer: Self-pay | Admitting: Internal Medicine

## 2016-02-25 DIAGNOSIS — M810 Age-related osteoporosis without current pathological fracture: Secondary | ICD-10-CM | POA: Diagnosis not present

## 2016-02-25 DIAGNOSIS — M8589 Other specified disorders of bone density and structure, multiple sites: Secondary | ICD-10-CM | POA: Diagnosis not present

## 2016-02-28 ENCOUNTER — Other Ambulatory Visit: Payer: Self-pay | Admitting: Internal Medicine

## 2016-03-02 ENCOUNTER — Encounter: Payer: Self-pay | Admitting: Physical Therapy

## 2016-03-02 DIAGNOSIS — R29818 Other symptoms and signs involving the nervous system: Secondary | ICD-10-CM | POA: Insufficient documentation

## 2016-03-02 DIAGNOSIS — R2681 Unsteadiness on feet: Secondary | ICD-10-CM | POA: Insufficient documentation

## 2016-03-02 DIAGNOSIS — M6281 Muscle weakness (generalized): Secondary | ICD-10-CM | POA: Insufficient documentation

## 2016-03-02 NOTE — Therapy (Signed)
[facility] [address] [place], [place], [address] Phone: [phone]   Fax:  [phone]  Patient Details  Name: Wanda [name] MRN: [id number] Date of Birth: 07/05/1955 Referring Provider:  Murriel Hopper, MD Encounter Date: 03/09/16   PHYSICAL THERAPY DISCHARGE SUMMARY  Visits from Start of Care: 2  Current functional level related to goals / functional outcomes: Unknown, as patient did not return to PT after initial 2 sessions.      PT Long Term Goals - 07/31/15 0939      PT LONG TERM GOAL #1   Title Pt will effectively perform home stretching program with assist of caregiver (son or [facility] aide) with no cueing from this PT to maximize functional gains made in therapy.   (Target date: 08/28/15)     PT LONG TERM GOAL #2   Title Caregiver will verbalize understanding of frequency/duration of pressure relief and assist pt in effectively performing pressure relief to preserve skin integrity.  (Target date: 08/28/15)     PT LONG TERM GOAL #3   Title Pt will perform supine <> sit with min A, 25% cueing from caregiver to indicate increased independence getting into/out of bed.   (Target date: 08/28/15)     PT LONG TERM GOAL #4   Title Pt will perform supine bridging with supervision and 25% cueing to decrease caregiver burden with scooting and hygiene.   (Target date: 08/28/15)     PT LONG TERM GOAL #5   Title Pt will perform level transfers from w/c <> mat table with min A and 25% cueing to decrease caregiver burden.   (Target date: 08/28/15)     Additional Long Term Goals   Additional Long Term Goals Yes     PT LONG TERM GOAL #6   Title Pt will perform static standing with LUE support at countertop x15 seconds with min guard to increase pt/caregiver safety with LB dressing.   (Target date: 08/28/15)     PT LONG TERM GOAL #7   Title Assess current R AFO for appropriate fit and make recommendations to maximize pt  safety with transfers, maintain joint positioning/alignment.  (Target date: 08/28/15)        Remaining deficits: Unknown, as patient did not return to PT after initial 2 sessions.    Education / Equipment: Education provided for transfer technique, pressure relief, HEP. Plan: Patient agrees to discharge.  Patient goals were not met. Patient is being discharged due to not returning since the last visit.  ?????            G-Codes - 2016-03-09 1052    Functional Assessment Tool Used max A for transfer w/c<>mat   Functional Limitation Changing and maintaining body position   Changing and Maintaining Body Position Goal Status (W1093) At least 40 percent but less than 60 percent impaired, limited or restricted   Changing and Maintaining Body Position Discharge Status (A3557) At least 40 percent but less than 60 percent impaired, limited or restricted      Billie Ruddy, PT, DPT [facility] [address] [place], [place], [address] Phone: [phone]   Fax:  [phone] 2016/03/09, 10:53 AM

## 2016-03-10 ENCOUNTER — Encounter: Payer: Self-pay | Admitting: Sports Medicine

## 2016-03-10 ENCOUNTER — Ambulatory Visit (INDEPENDENT_AMBULATORY_CARE_PROVIDER_SITE_OTHER): Payer: Medicare Other | Admitting: Sports Medicine

## 2016-03-10 DIAGNOSIS — B351 Tinea unguium: Secondary | ICD-10-CM | POA: Diagnosis not present

## 2016-03-10 DIAGNOSIS — M79671 Pain in right foot: Secondary | ICD-10-CM | POA: Diagnosis not present

## 2016-03-10 DIAGNOSIS — M79672 Pain in left foot: Secondary | ICD-10-CM

## 2016-03-10 DIAGNOSIS — Z8673 Personal history of transient ischemic attack (TIA), and cerebral infarction without residual deficits: Secondary | ICD-10-CM

## 2016-03-10 NOTE — Progress Notes (Signed)
Patient ID: DEJANIRA PAMINTUAN, female   DOB: [birth year], 60 y.o.   MRN: [id number] Subjective: LISBETH PULLER is a 60 y.o. female patient seen today in office with complaint of painful thickened and elongated toenails; unable to trim. Patient has history of stroke. Patient is assisted by son and family caregiver who states that his mother has no other pedal complaints at this time.   Patient Active Problem List   Diagnosis Date Noted  . Intertrigo 01/31/2016  . Posterior pain of left hip 01/31/2016  . Rash and nonspecific skin eruption 09/12/2015  . Constipation 07/05/2015  . Neck muscle strain 05/09/2015  . Diastolic dysfunction 08/31/2013  . Right hemiparesis (HCC) 07/18/2013  . SVT (supraventricular tachycardia) (HCC) 03/18/2013  . Need for prophylactic vaccination with combined diphtheria-tetanus-pertussis (DTP) vaccine 09/21/2012  . Seasonal allergies 09/21/2012  . Osteoporosis 09/21/2012  . Routine adult health maintenance 11/27/2010  . INSOMNIA 09/19/2009  . Bilateral lower extremity edema 12/25/2008  . Asthma 07/13/2006  . ANEMIA-IRON DEFICIENCY 03/02/2006  . DEPRESSION 03/02/2006  . Essential hypertension 03/02/2006  . GERD 03/02/2006  . SEIZURE DISORDER 03/02/2006  . CVA (cerebral infarction) 03/02/2006    Current Outpatient Prescriptions on File Prior to Visit  Medication Sig Dispense Refill  . albuterol (PROVENTIL) (2.5 MG/3ML) 0.083% nebulizer solution Take 3 mLs (2.5 mg total) by nebulization every 6 (six) hours as needed for wheezing or shortness of breath. 75 mL 12  . aspirin 81 MG tablet Take 81 mg by mouth daily.      Marland Kitchen atorvastatin (LIPITOR) 40 MG tablet Take 1 tablet (40 mg total) by mouth daily. 30 tablet 3  . Blood Pressure Monitoring (BLOOD PRESSURE CUFF) MISC Does not apply route 1 each 0  . calcium-vitamin D (OSCAL) 250-125 MG-UNIT per tablet Take 1 tablet by mouth daily.      . Cholecalciferol (REPLESTA) 50000 UNITS WAFR Take 50,000 Units by mouth every 7  (seven) days. On wednesdays    . cyclobenzaprine (FLEXERIL) 5 MG tablet Take 1 tablet (5 mg total) by mouth 3 (three) times daily as needed for muscle spasms. 90 tablet 2  . denosumab (PROLIA) 60 MG/ML SOLN injection Inject 60 mg into the skin every 6 (six) months. Administer in upper arm, thigh, or abdomen  0  . gabapentin (NEURONTIN) 300 MG capsule Take 1 capsule (300 mg total) by mouth 3 (three) times daily. 270 capsule 3  . hydrochlorothiazide (HYDRODIURIL) 25 MG tablet Take 1 tablet (25 mg total) by mouth daily. 90 tablet 3  . ipratropium (ATROVENT) 0.02 % nebulizer solution Take 2.5 mLs (0.5 mg total) by nebulization every 4 (four) hours as needed for wheezing or shortness of breath. 62.5 mL 2  . levETIRAcetam (KEPPRA) 500 MG tablet Take 1 tablet (500 mg total) by mouth 2 (two) times daily. 180 tablet 3  . lisinopril (PRINIVIL,ZESTRIL) 10 MG tablet Take 1 tablet (10 mg total) by mouth daily. 90 tablet 3  . loratadine (CLARITIN) 10 MG tablet TAKE 1 TABLET (10 MG TOTAL) BY MOUTH DAILY. 30 tablet 6  . metoCLOPramide (REGLAN) 5 MG tablet Take 1 tablet (5 mg total) by mouth every 6 (six) hours as needed for nausea. 360 tablet 0  . metoprolol (LOPRESSOR) 50 MG tablet Take 1.5 tablets (75 mg total) by mouth 2 (two) times daily. 90 tablet 0  . Multiple Vitamins-Minerals (ADULT ONE DAILY GUMMIES) CHEW Chew 2 each by mouth daily.    Marland Kitchen nystatin (MYCOSTATIN/NYSTOP) powder Apply topically 4 (four) times daily. 56.7  g 2  . omeprazole (PRILOSEC) 20 MG capsule Take 1 capsule (20 mg total) by mouth daily. 90 capsule 0  . polyethylene glycol (MIRALAX / GLYCOLAX) packet Take 17 g by mouth daily as needed. 100 each 0  . potassium chloride (K-DUR) 10 MEQ tablet Take 1 tablet (10 mEq total) by mouth 2 (two) times daily. 60 tablet 0  . promethazine (PHENERGAN) 12.5 MG tablet Take 1 tablet (12.5 mg total) by mouth every 8 (eight) hours as needed for nausea. 45 tablet 2  . senna-docusate (SENOKOT-S) 8.6-50 MG tablet  Take 2 tablets by mouth 2 (two) times daily. 120 tablet 3  . venlafaxine XR (EFFEXOR-XR) 75 MG 24 hr capsule Take 1 capsule (75 mg total) by mouth daily with breakfast. 90 capsule 3  . VOLTAREN 1 % GEL Apply 2 g topically 4 (four) times daily. 100 g 2   No current facility-administered medications on file prior to visit.     Allergies  Allergen Reactions  . Lasix [Furosemide] Swelling    mild L facial swelling  . Penicillins Swelling  . Sulfur Itching    Objective: Physical Exam  General: Well developed, nourished, no acute distress, awake, alert and oriented x 3  Vascular: Dorsalis pedis artery 0/4 bilateral, Posterior tibial artery 1/4 bilateral, skin temperature warm to warm proximal to distal bilateral lower extremities, no varicosities. Mild venous rash to right dorsal lateral foot. No acute signs of infection, no pedal hair present bilateral. Trace edema right>left.  Neurological: Gross sensation present via light touch bilateral. Patient is hyper-sensitive bilateral.   Dermatological: Skin is warm, dry, and supple bilateral, Nails 1-10 are tender, long, thick, and discolored with moderate subungal debris, no webspace macerations present bilateral, no open lesions present bilateral, no callus/corns/hyperkeratotic tissue present bilateral. No signs of infection bilateral.  Musculoskeletal:Bunion and hammertoe boney deformities noted bilateral. Muscular strength within normal limits without pain on range of motion on left, 4/5 on right. No pain with calf compression bilateral.  Assessment and Plan:  Problem List Items Addressed This Visit    None    Visit Diagnoses    Dermatophytosis of nail    -  Primary   Foot pain, bilateral       History of stroke         -Examined patient.  -Discussed treatment options for painful mycotic nails. -Mechanically debrided and reduced mycotic nails with sterile nail nipper and dremel nail file without incident. -Advised cortisone cream to  venous rash on right foot. If continues to worsen, may consider vascular testing. -Patient to return in 3 months for follow up evaluation or sooner if symptoms worsen.  Asencion Islamitorya Hudson Lehmkuhl, DPM

## 2016-03-11 ENCOUNTER — Ambulatory Visit: Payer: Medicare Other | Attending: Internal Medicine | Admitting: Rehabilitative and Restorative Service Providers"

## 2016-03-11 ENCOUNTER — Encounter: Payer: Self-pay | Admitting: Rehabilitative and Restorative Service Providers"

## 2016-03-11 DIAGNOSIS — R29818 Other symptoms and signs involving the nervous system: Secondary | ICD-10-CM

## 2016-03-11 DIAGNOSIS — R2681 Unsteadiness on feet: Secondary | ICD-10-CM | POA: Diagnosis not present

## 2016-03-11 DIAGNOSIS — M6281 Muscle weakness (generalized): Secondary | ICD-10-CM

## 2016-03-12 NOTE — Therapy (Signed)
[facility] [address] [place], [place], [address] Phone: [phone]   Fax:  [phone]  Physical Therapy Evaluation  Patient Details  Name: Wanda [name] MRN: [id number] Date of Birth: 06/20/1955 Referring Provider: Gwynn BurlyAndrew Wallace, DO  Encounter Date: 03/11/2016      PT End of Session - 03/12/16 0927    Visit Number 1   Number of Visits 9   Date for PT Re-Evaluation 04/26/16   Authorization Type Medicare traditional primary; Medicaid secondary - G Codes and PN required every 10 visits   PT Start Time 1236   PT Stop Time 1325   PT Time Calculation (min) 49 min   Equipment Utilized During Treatment Gait belt   Activity Tolerance Patient tolerated treatment well   Behavior During Therapy [facility] for tasks assessed/performed      Past Medical History:  Diagnosis Date  . ANEMIA-IRON DEFICIENCY 03/02/2006   H&H 8.5/25.9 5/09. On 5/13 12.8/39.7 with MCV 80.4.     Marland Kitchen. 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 Depression   . GERD (gastroesophageal reflux disease)   . Hypertension   . Osteopenia   . 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 SLE (systemic lupus erythematosus) (HCC)   . Stroke (HCC) 1991  . SVT (supraventricular tachycardia) (HCC)    a. 03/2013 with mildly elevated troponin at that time.  . Venous insufficiency (chronic) (peripheral)     Past Surgical History:  Procedure Laterality Date  . CEREBRAL ANEURYSM REPAIR     1990's  . ESOPHAGOGASTRODUODENOSCOPY  2001   Varices and esophagitis  . TOTAL ABDOMINAL HYSTERECTOMY  5/09   Benign cervix, uterine fibroids on bx    There were no vitals filed for this visit.       Subjective Assessment - 03/11/16 2123    Subjective The patient's caregiver and son report they want to learn things to help her at home in order to improve use of the R side.  Initial CVA  was 1991.  Patient has 24 hour care ( 7 hours/ 6 days/week from aide) [facility] and family.  She needs assist for all mobility and gets up to w/c with assist daily.    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   Currently in Pain? Yes   Pain Score --  Patient uses head nodding to distinguish "a little" discomfort, not "a lot"   Pain Location Leg   Pain Orientation Right;Left   Pain Descriptors / Indicators Burning   Pain Type Chronic pain   Pain Onset More than a month ago   Pain Frequency Intermittent   Aggravating Factors  Unable to articulate   Pain Relieving Factors unable to articulate              [facility] PT Assessment - 03/11/16 2125      Assessment   Medical Diagnosis R hemiparesis due to CVA   Referring Provider Gwynn BurlyAndrew Wallace, DO   Onset Date/Surgical Date --  1991   Hand Dominance --  Uses left due to hemiparesis R   Prior Therapy known to our clinic from  prior PT     Precautions   Precautions Fall   Required Braces or Orthoses --  has AFO at home     Restrictions   Weight Bearing Restrictions No     Balance Screen   Has the patient fallen in the past 6 months No   Has the patient had a decrease in activity level because of a fear of falling?  Yes   Is the patient reluctant to leave their home because of a fear of falling?  Yes     Home Environment   Living Environment Private residence   Living Arrangements Children   Type of Home House   Home Access Ramped entrance   Home Layout One level   Home Equipment Walker - 2 wheels;Tub bench;Bedside commode;Wheelchair - manual;Hospital bed  Film/video editor Comments HHA 7 hours/day, 6 days/week     Prior  Function   Level of Independence Needs assistance with ADLs;Needs assistance with homemaking;Needs assistance with gait;Needs assistance with transfers   Leisure gets out with family     Cognition   Overall Cognitive Status History of cognitive impairments - at baseline     Observation/Other Assessments   Observations At rest, RLE positioned in R forefoot supination. RUE flexor synergy at rest.    Skin Integrity all visible areas intact, family reports no current skin issues.  Incontinent of bowel and bladder   Focus on Therapeutic Outcomes (FOTO)  12%   Other Surveys  Other Surveys   Stroke Impact Scale  11.1%     Sensation   Light Touch Appears Intact     ROM / Strength   AROM / PROM / Strength PROM;Strength     AROM   Overall AROM Comments Patient utilizes the L LE for positioning the R leg at times.  She has limited use of R side due to dense hemiplegia, hypertonicity, and joint contractures.     PROM   Overall PROM Comments R ankle maintained in plantar flexion and inversion     Strength   Overall Strength Comments R hemiplegia with no isolated motor control.     Bed Mobility   Bed Mobility --  Requires min to mod A for bed mobility.     Transfers   Transfers Stand Pivot Transfers;Lateral/Scoot Transfers   Stand Pivot Transfers 2: Max Wellsite geologist Details (indicate cue type and reason) Patient's son and caregiver demonstrate stand pivot as they use in home with max assist. Caregiver is c/o back discomfort.   Lateral/Scoot Transfers 4: Min Psychologist, prison and probation services Details (indicate cue type and reason) PT had patient demo use of sliding board with cues and min A to show caregivers that patient would be able to be more actively involved in the transfer process versus caregivers lifting patient.  This would reduce injury to self and patient.   Supine to Sit 4: Min assist   Sit to Supine 3: Mod assist   Comments Recommended family bring in AFO for  next session to assess foot position with brace donned during transfers     Ambulation/Gait   Ambulation/Gait No  non-ambulatory since 1991 due to CVA                           PT Education - 03/12/16 0926    Education provided Yes   Education Details Use of sliding board   Person(s) Educated Patient;Child(ren);Caregiver(s)   Methods  Explanation;Demonstration   Comprehension Verbalized understanding;Returned demonstration;Verbal cues required;Need further instruction          PT Short Term Goals - 03/12/16 1507      PT SHORT TERM GOAL #1   Title STG's = LTG's           PT Long Term Goals - 03/12/16 1507      PT LONG TERM GOAL #1   Title Pt will effectively perform home stretching program with assist of caregiver (son or [facility] aide) with no cueing from this PT to maximize functional gains made in therapy.      Baseline Target date 04/26/2016   Time 4   Period Weeks     PT LONG TERM GOAL #2   Title Caregiver will verbalize understanding of frequency/duration of pressure relief and assist pt in effectively performing pressure relief to preserve skin integrity.   Baseline Target date 04/26/2016   Time 4   Period Weeks     PT LONG TERM GOAL #3   Title Pt will perform supine <> sit with min A, to demo increased independence getting into/out of bed.   Baseline Target date 04/26/2016   Time 4   Period Weeks     PT LONG TERM GOAL #4   Title Pt will perform supine <>sidelying rolling for improved assist with dressing/ADL tasks and positioning in bed with supervision and verbal cues.    Baseline Target date 04/26/2016   Time 4   Period Weeks     PT LONG TERM GOAL #5   Title Pt will perform level transfers from w/c <> mat table with CGA to decrease caregiver burden.     Baseline Target date 04/26/2016   Time 4   Period Weeks     PT LONG TERM GOAL #6   Title Pt will perform static standing with LUE support at countertop x15 seconds with min guard to  increase pt/caregiver safety with LB dressing.     Baseline Target date 04/26/2016   Time 4   Period Weeks     PT LONG TERM GOAL #7   Title Assess current R AFO for appropriate fit and make recommendations to maximize pt safety with transfers, maintain joint positioning/alignment.     Baseline Target date 04/26/2016   Time 4   Period Weeks               Plan - 03/12/16 2115    Clinical Impression Statement The patient is a 60 year old female presenting to OP PT with chronic deficits from CVA in 1991.  The patient has joint contractures limiting positioning during transfers, requires max A from caregivers using stand pivot technique and has poor motor control R side with hemiparesis.  PT to provide patient/caregiver education to optimize patient safety and reduce the risk of caregiver injury.   Rehab Potential Fair   Clinical Impairments Affecting Rehab Potential time since CVA (1991)   PT Frequency 2x / week   PT Duration 4 weeks   PT Treatment/Interventions ADLs/Self Care Home Management;Functional mobility training;Therapeutic activities;Therapeutic exercise;Balance training;DME Instruction;Neuromuscular re-education;Cognitive remediation;Patient/family education;Orthotic Fit/Training   PT Next Visit Plan establish HEP for spasticity management, try sit<>stand and watch ankle positioning *requested family bring in patient's AFO*, static standing in parallel bars, sliding board transfers and improving safety during transfers to reduce risk of caregiver injury.   Consulted and Agree with Plan of Care Patient;Family member/caregiver   Family Member Consulted Son, [place]; and [facility] Aide  Patient will benefit from skilled therapeutic intervention in order to improve the following deficits and impairments:  Postural dysfunction, Impaired tone, Cardiopulmonary status limiting activity, Increased edema, Decreased balance, Decreased activity tolerance, Decreased endurance, Decreased skin  integrity, Impaired sensation, Decreased strength, Impaired flexibility, Impaired UE functional use, Decreased mobility, Decreased coordination  Visit Diagnosis: Muscle weakness (generalized)  Unsteadiness  Other symptoms and signs involving the nervous system     Problem List Patient Active Problem List   Diagnosis Date Noted  . Intertrigo 01/31/2016  . Posterior pain of left hip 01/31/2016  . Rash and nonspecific skin eruption 09/12/2015  . Constipation 07/05/2015  . Neck muscle strain 05/09/2015  . Diastolic dysfunction 08/31/2013  . Right hemiparesis (HCC) 07/18/2013  . SVT (supraventricular tachycardia) (HCC) 03/18/2013  . Need for prophylactic vaccination with combined diphtheria-tetanus-pertussis (DTP) vaccine 09/21/2012  . Seasonal allergies 09/21/2012  . Osteoporosis 09/21/2012  . Routine adult health maintenance 11/27/2010  . INSOMNIA 09/19/2009  . Bilateral lower extremity edema 12/25/2008  . Asthma 07/13/2006  . ANEMIA-IRON DEFICIENCY 03/02/2006  . DEPRESSION 03/02/2006  . Essential hypertension 03/02/2006  . GERD 03/02/2006  . SEIZURE DISORDER 03/02/2006  . CVA (cerebral infarction) 03/02/2006    Kailon Treese, PT 03/12/2016, 9:29 PM  [facility] [address] [place], [place], [address] Phone: [phone]   Fax:  [phone]  Name: Wanda [name] MRN: [id number] Date of Birth: 1956/03/14

## 2016-03-23 ENCOUNTER — Ambulatory Visit: Payer: Medicare Other | Attending: Internal Medicine | Admitting: Physical Therapy

## 2016-03-23 ENCOUNTER — Encounter: Payer: Self-pay | Admitting: Physical Therapy

## 2016-03-23 DIAGNOSIS — R29818 Other symptoms and signs involving the nervous system: Secondary | ICD-10-CM | POA: Insufficient documentation

## 2016-03-23 DIAGNOSIS — M6281 Muscle weakness (generalized): Secondary | ICD-10-CM | POA: Diagnosis not present

## 2016-03-23 DIAGNOSIS — R2681 Unsteadiness on feet: Secondary | ICD-10-CM | POA: Diagnosis not present

## 2016-03-23 NOTE — Patient Instructions (Signed)
Dorsiflexion: Stretch - Heel Cord / Gastrocnemius    Position (A) Patient: Lie or sit with knee straight.  Helper: Cup left heel. Make sure grip is firm. Motion (B) - Helper uses forearm to apply pressure to entire sole of foot, stretching foot toward shin. CAUTION: Stretch should be felt in calf. Do not allow foot to twist.  Hold 60_ seconds. Repeat __2-3_ times. Repeat with other leg. Do _1-2_ sessions per day.   Copyright  VHI. All rights reserved.  Extension: Stretch - Posterior Knee (Supine / Sitting)    Position Patient: Lie with right ankle and foot supported on pillow.  Helper: Place hands above and below knee joint on front of leg. Motion - Helper applies steady downward pressure in direction of straightening knee. CAUTION: Use moderate pressure to patient's tolerance. Do not allow knee to go beyond straight.  Hold _60_ seconds. Repeat _3__ times.   Copyright  VHI. All rights reserved.  Rotation: ROM (Supine)    Position (A) Helper: Place hand on left knee. Stabilize shoulder on same side. Motion (B) - Lower legs to side. - Keep shoulders flat. Hold for 30 seconds. Repeat to other side, hold for 30 seconds. CAUTION: Discontinue if patient experiences back pain during exercise. Repeat 3_ times. Repeat to other side.  Do __1-2_ sessions per day.   Copyright  VHI. All rights reserved.  CAREGIVER ASSISTED: Adductors    Caregiver moves leg out to side, keeping toes pointed up. Hold _60_ seconds. _3__ reps per set, ___ sets per day, __1-2_ days per week   Copyright  VHI. All rights reserved.  CAREGIVER ASSISTED: Hamstrings - Supine    Caregiver holds LEFT leg at ankle and over knee; raises leg straight. Hold __60_ seconds. _3__ reps per set, _1__ sets per day, 1-2 times a day.   Copyright  VHI. All rights reserved.

## 2016-03-24 ENCOUNTER — Other Ambulatory Visit: Payer: Self-pay | Admitting: Internal Medicine

## 2016-03-24 NOTE — Therapy (Signed)
[facility] [address] [place], [place], [address] Phone: [phone]   Fax:  [phone]  Physical Therapy Treatment  Patient Details  Name: Wanda [name] MRN: [id number] Date of Birth: 02/23/1956 Referring Provider: Gwynn BurlyAndrew Wallace, DO  Encounter Date: 03/23/2016      PT End of Session - 03/23/16 1413    Visit Number 2   Number of Visits 9   Date for PT Re-Evaluation 04/26/16   Authorization Type Medicare traditional primary; Medicaid secondary - G Codes and PN required every 10 visits   PT Start Time 1404   PT Stop Time 1448   PT Time Calculation (min) 44 min   Equipment Utilized During Treatment Gait belt   Activity Tolerance Patient tolerated treatment well   Behavior During Therapy [facility] for tasks assessed/performed      Past Medical History:  Diagnosis Date  . ANEMIA-IRON DEFICIENCY 03/02/2006   H&H 8.5/25.9 5/09. On 5/13 12.8/39.7 with MCV 80.4.     Marland Kitchen. 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 Depression   . GERD (gastroesophageal reflux disease)   . Hypertension   . Osteopenia   . 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 SLE (systemic lupus erythematosus) (HCC)   . Stroke (HCC) 1991  . SVT (supraventricular tachycardia) (HCC)    a. 03/2013 with mildly elevated troponin at that time.  . Venous insufficiency (chronic) (peripheral)     Past Surgical History:  Procedure Laterality Date  . CEREBRAL ANEURYSM REPAIR     1990's  . ESOPHAGOGASTRODUODENOSCOPY  2001   Varices and esophagitis  . TOTAL ABDOMINAL HYSTERECTOMY  5/09   Benign cervix, uterine fibroids on bx    There were no vitals filed for this visit.      Subjective Assessment - 03/23/16 1411    Subjective No new complaints. No falls to report.    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   Currently in Pain? Yes   Pain Score --  pt unable to rate   Pain Location Leg   Pain Orientation Right;Left   Pain Descriptors / Indicators Burning   Pain Type Chronic pain   Pain Onset More than a month ago   Pain Frequency Intermittent   Aggravating Factors  unable to state due to aphasia   Pain Relieving Factors unable to state due to aphasia            [facility] PT Treatment/Exercise - 03/23/16 1416      Transfers   Transfers Lateral/Scoot Transfers   Lateral/Scoot Transfers 4: Min assist;3: Mod assist;With FPL Groupslide board;With armrests removed   Lateral/Scoot Transfer Details (indicate cue type and reason) transfer car<>wheelchair with min assist from car and mod/max assist into car, cues on sequencing and technique. wheelchair<>mat table with min assist to mat and mod assist back into wheelchair. cues on anterior weight shifting and sequencing with LE's and UE assist.                             issued the following to HEP: cues to pt and caregiver  on how to perform the following. Pt needed min/mod assist for supine<>sitting EOM. Dorsiflexion: Stretch - Heel Cord / Gastrocnemius    Position (A) Patient: Lie or sit with knee straight.  Helper: Cup left heel. Make sure grip is firm. Motion (B) - Helper uses forearm to apply pressure to entire sole of foot, stretching foot toward shin. CAUTION: Stretch should be felt in calf. Do not allow foot to twist.  Hold 60_ seconds. Repeat __2-3_ times. Repeat with other leg. Do _1-2_ sessions per day.   Copyright  VHI. All rights reserved.  Extension: Stretch - Posterior Knee (Supine / Sitting)    Position Patient: Lie with right ankle and foot supported on pillow.  Helper: Place hands above and below knee  joint on front of leg. Motion - Helper applies steady downward pressure in direction of straightening knee. CAUTION: Use moderate pressure to patient's tolerance. Do not allow knee to go beyond straight.  Hold _60_ seconds. Repeat _3__ times.   Copyright  VHI. All rights reserved.  Rotation: ROM (Supine)    Position (A) Helper: Place hand on left knee. Stabilize shoulder on same side. Motion (B) - Lower legs to side. - Keep shoulders flat. Hold for 30 seconds. Repeat to other side, hold for 30 seconds. CAUTION: Discontinue if patient experiences back pain during exercise. Repeat 3_ times. Repeat to other side.  Do __1-2_ sessions per day.   Copyright  VHI. All rights reserved.  CAREGIVER ASSISTED: Adductors    Caregiver moves leg out to side, keeping toes pointed up. Hold _60_ seconds. _3__ reps per set, ___ sets per day, __1-2_ days per week   Copyright  VHI. All rights reserved.  CAREGIVER ASSISTED: Hamstrings - Supine    Caregiver holds LEFT leg at ankle and over knee; raises leg straight. Hold __60_ seconds. _3__ reps per set, _1__ sets per day, 1-2 times a day.   Copyright  VHI. All rights reserved.            PT Education - 03/24/16 1143    Education provided Yes   Education Details HEP for stretching; slide board transfers   Person(s) Educated Patient;Caregiver(s)   Methods Explanation;Demonstration;Verbal cues;Handout   Comprehension Verbalized understanding;Returned demonstration;Verbal cues required;Need further instruction          PT Short Term Goals - 03/12/16 1507      PT SHORT TERM GOAL #1   Title STG's = LTG's           PT Long Term Goals - 03/12/16 1507      PT LONG TERM GOAL #1   Title Pt will effectively perform home stretching program with assist of caregiver (son or [facility]) with no cueing from this PT to maximize functional gains made in therapy.      Baseline Target date 04/26/2016   Time 4   Period Weeks     PT LONG TERM  GOAL #2   Title Caregiver will verbalize understanding of frequency/duration of pressure relief and assist pt in effectively performing pressure relief to preserve skin integrity.   Baseline Target date 04/26/2016   Time 4   Period Weeks     PT LONG TERM GOAL #3   Title Pt will perform supine <> sit with min A, to demo increased independence getting into/out of bed.   Baseline Target date 04/26/2016   Time 4   Period Weeks     PT LONG TERM GOAL #4   Title Pt will perform supine <>sidelying  rolling for improved assist with dressing/ADL tasks and positioning in bed with supervision and verbal cues.    Baseline Target date 04/26/2016   Time 4   Period Weeks     PT LONG TERM GOAL #5   Title Pt will perform level transfers from w/c <> mat table with CGA to decrease caregiver burden.     Baseline Target date 04/26/2016   Time 4   Period Weeks     PT LONG TERM GOAL #6   Title Pt will perform static standing with LUE support at countertop x15 seconds with min guard to increase pt/caregiver safety with LB dressing.     Baseline Target date 04/26/2016   Time 4   Period Weeks     PT LONG TERM GOAL #7   Title Assess current R AFO for appropriate fit and make recommendations to maximize pt safety with transfers, maintain joint positioning/alignment.     Baseline Target date 04/26/2016   Time 4   Period Weeks            Plan - 03/23/16 1413    Clinical Impression Statement today's skilled session continued to work on sliding board transfers, including in/out of car. Remainder of session focused on issuing stretching HEP with education to pt's HHaide. Pt is making steady progress toward goals and should benefit from continued PT to progres toward unmet goals.    Rehab Potential Fair   Clinical Impairments Affecting Rehab Potential time since CVA (1991)   PT Frequency 2x / week   PT Duration 4 weeks   PT Treatment/Interventions ADLs/Self Care Home Management;Functional mobility  training;Therapeutic activities;Therapeutic exercise;Balance training;DME Instruction;Neuromuscular re-education;Cognitive remediation;Patient/family education;Orthotic Fit/Training   PT Next Visit Plan review HEP for spasticity management, try sit<>stand and watch ankle positioning *requested family bring in patient's AFO*, static standing in parallel bars, sliding board transfers and improving safety during transfers to reduce risk of caregiver injury.   Consulted and Agree with Plan of Care Patient;Family member/caregiver   Family Member Consulted Son, Berna Spare; and [facility] Aide      Patient will benefit from skilled therapeutic intervention in order to improve the following deficits and impairments:  Postural dysfunction, Impaired tone, Cardiopulmonary status limiting activity, Increased edema, Decreased balance, Decreased activity tolerance, Decreased endurance, Decreased skin integrity, Impaired sensation, Decreased strength, Impaired flexibility, Impaired UE functional use, Decreased mobility, Decreased coordination  Visit Diagnosis: Muscle weakness (generalized)  Unsteadiness  Other symptoms and signs involving the nervous system     Problem List Patient Active Problem List   Diagnosis Date Noted  . Intertrigo 01/31/2016  . Posterior pain of left hip 01/31/2016  . Rash and nonspecific skin eruption 09/12/2015  . Constipation 07/05/2015  . Neck muscle strain 05/09/2015  . Diastolic dysfunction 08/31/2013  . Right hemiparesis (HCC) 07/18/2013  . SVT (supraventricular tachycardia) (HCC) 03/18/2013  . Need for prophylactic vaccination with combined diphtheria-tetanus-pertussis (DTP) vaccine 09/21/2012  . Seasonal allergies 09/21/2012  . Osteoporosis 09/21/2012  . Routine adult health maintenance 11/27/2010  . INSOMNIA 09/19/2009  . Bilateral lower extremity edema 12/25/2008  . Asthma 07/13/2006  . ANEMIA-IRON DEFICIENCY 03/02/2006  . DEPRESSION 03/02/2006  . Essential hypertension  03/02/2006  . GERD 03/02/2006  . SEIZURE DISORDER 03/02/2006  . CVA (cerebral infarction) 03/02/2006    Sallyanne Kuster, PTA, [facility] [address], [address] [place], [place] [address] [phone] 03/24/16, 11:44 AM   Name: Wanda [name] MRN: [id number] Date of Birth: [birth year]

## 2016-03-25 ENCOUNTER — Ambulatory Visit: Payer: Medicare Other | Admitting: Rehabilitative and Restorative Service Providers"

## 2016-03-25 DIAGNOSIS — R29818 Other symptoms and signs involving the nervous system: Secondary | ICD-10-CM

## 2016-03-25 DIAGNOSIS — M6281 Muscle weakness (generalized): Secondary | ICD-10-CM | POA: Diagnosis not present

## 2016-03-25 DIAGNOSIS — R2681 Unsteadiness on feet: Secondary | ICD-10-CM | POA: Diagnosis not present

## 2016-03-25 NOTE — Telephone Encounter (Signed)
appt tomorrow with pcpGoldston, Johnmichael Melhorn Cassady11/8/20178:31 AM

## 2016-03-25 NOTE — Therapy (Signed)
[facility] [address] [place], [place], [address] Phone: [phone]   Fax:  [phone]  Physical Therapy Treatment  Patient Details  Name: Wanda [name] MRN: [id number] Date of Birth: 04/07/1956 Referring Provider: Gwynn BurlyAndrew Wallace, DO  Encounter Date: 03/25/2016      PT End of Session - 03/25/16 2047    Visit Number 3   Number of Visits 9   Date for PT Re-Evaluation 04/26/16   Authorization Type Medicare traditional primary; Medicaid secondary - G Codes and PN required every 10 visits   PT Start Time 1108   PT Stop Time 1147   PT Time Calculation (min) 39 min   Equipment Utilized During Treatment Gait belt   Activity Tolerance Patient tolerated treatment well   Behavior During Therapy WFL for tasks assessed/performed      Past Medical History:  Diagnosis Date  . ANEMIA-IRON DEFICIENCY 03/02/2006   H&H 8.5/25.9 5/09. On 5/13 12.8/39.7 with MCV 80.4.     Marland Kitchen. 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 Depression   . GERD (gastroesophageal reflux disease)   . Hypertension   . Osteopenia   . 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 SLE (systemic lupus erythematosus) (HCC)   . Stroke (HCC) 1991  . SVT (supraventricular tachycardia) (HCC)    a. 03/2013 with mildly elevated troponin at that time.  . Venous insufficiency (chronic) (peripheral)     Past Surgical History:  Procedure Laterality Date  . CEREBRAL ANEURYSM REPAIR     1990's  . ESOPHAGOGASTRODUODENOSCOPY  2001   Varices and esophagitis  . TOTAL ABDOMINAL HYSTERECTOMY  5/09   Benign cervix, uterine fibroids on bx    There were no vitals filed for this visit.      Subjective Assessment - 03/25/16 2042    Subjective The patient's caregiver brought AFO with her today.   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   Currently in Pain? No/denies  none reported today, however painful non-verbals with palpating R foot      THERAPEUTIC ACTIVITIES: Sliding board transfer (initially requiring mod A to get started) with min to mod A moving w/c>mat and min A moving mat>w/c. Rolling in bed for trunk mobility and caregiver ease with patient performing supine>R roll x 10 reps and supine to L roll x 10 reps with cues to use L UE to grasp R wrist to improve shoulder initiation on R side for rolling. Sit<>supine x 2 reps with education on compensatory techniques following this sequence:   L elbow>L LE helps R LE lift onto mat>transition to L sidelying>roll to back and reverse.  Patient initially required min to mod A and then performed with CGA to min A while providing verbal and tactile cues.   Sit<>stand to countertop x 2 repetitions with R AFO (static progressive) donned maintaining upright x 1 minute each time with +2 assist for safety (mod A to come to stand)  NEUROMUSCULAR RE-EDUCATION: Seated rolling physioball anteriorly to encourage trunk engagement and anterior leaning with increased LE weight bearing.  SELF CARE/HOME MANAGEMENT: Discussed purpose of type of AFO (static progressive).  Patient has initial discomfort when donning (in posterior aspect/heel cord due to tightness).  This was different than a palpable tenderness of R foot and mild edema.   Recommended patient/family facilitate wear x 30 minutes and check skin.  Can increase as tolerated.       PT Education - 03/25/16 2048    Education provided Yes   Education Details Use of static progressive ankle orthoses to be used x 30 minutes 1-2x/day and during any standing activities to stabilize the ankle and provide  positioning (prevent inversion and ankle injury).   Person(s) Educated Patient;Child(ren);Caregiver(s)   Methods Explanation   Comprehension Verbalized understanding          PT Short Term Goals - 03/12/16 1507      PT SHORT TERM GOAL #1   Title STG's = LTG's           PT Long Term Goals - 03/12/16 1507      PT LONG TERM GOAL #1   Title Pt will effectively perform home stretching program with assist of caregiver (son or [facility] aide) with no cueing from this PT to maximize functional gains made in therapy.      Baseline Target date 04/26/2016   Time 4   Period Weeks     PT LONG TERM GOAL #2   Title Caregiver will verbalize understanding of frequency/duration of pressure relief and assist pt in effectively performing pressure relief to preserve skin integrity.   Baseline Target date 04/26/2016   Time 4   Period Weeks     PT LONG TERM GOAL #3   Title Pt will perform supine <> sit with min A, to demo increased independence getting into/out of bed.   Baseline Target date 04/26/2016   Time 4   Period Weeks     PT LONG TERM GOAL #4   Title Pt will perform supine <>sidelying rolling for improved assist with dressing/ADL tasks and positioning in bed with supervision and verbal cues.    Baseline Target date 04/26/2016   Time 4   Period Weeks     PT LONG TERM GOAL #5   Title Pt will perform level transfers from w/c <> mat table with CGA to decrease caregiver burden.     Baseline Target date 04/26/2016   Time 4   Period Weeks     PT LONG TERM GOAL #6   Title Pt will perform static standing with LUE support at countertop x15 seconds with min guard to increase pt/caregiver safety with LB dressing.     Baseline Target date 04/26/2016   Time 4   Period Weeks     PT LONG TERM GOAL #7   Title Assess current R AFO for appropriate fit and make recommendations to maximize pt safety with transfers, maintain joint positioning/alignment.     Baseline Target date 04/26/2016   Time 4    Period Weeks               Plan - 03/25/16 2050    Clinical Impression Statement The patient has pain (through non-verbal signs) in R foot with edema, skin redness.  PT recommended her caregivers f/u with MD for further assessment.  Did perform weight bearing monitoring patient tolerance with static progressive ankle foot orthoses donned to maintain alignment during standing.  Continue working towards [employer].    PT Treatment/Interventions ADLs/Self Care Home Management;Functional mobility training;Therapeutic activities;Therapeutic exercise;Balance training;DME Instruction;Neuromuscular re-education;Cognitive remediation;Patient/family education;Orthotic Fit/Training   PT Next Visit Plan Review HEP for spasticity  mgmt as needed, standing at sink with AFO (static progressive orthoses) donned, bed mobility, sliding board transfers   Consulted and Agree with Plan of Care Patient;Family member/caregiver   Family Member Consulted Son, Berna SpareMarcus; and [facility]      Patient will benefit from skilled therapeutic intervention in order to improve the following deficits and impairments:  Postural dysfunction, Impaired tone, Cardiopulmonary status limiting activity, Increased edema, Decreased balance, Decreased activity tolerance, Decreased endurance, Decreased skin integrity, Impaired sensation, Decreased strength, Impaired flexibility, Impaired UE functional use, Decreased mobility, Decreased coordination  Visit Diagnosis: Muscle weakness (generalized)  Unsteadiness  Other symptoms and signs involving the nervous system     Problem List Patient Active Problem List   Diagnosis Date Noted  . Intertrigo 01/31/2016  . Posterior pain of left hip 01/31/2016  . Rash and nonspecific skin eruption 09/12/2015  . Constipation 07/05/2015  . Neck muscle strain 05/09/2015  . Diastolic dysfunction 08/31/2013  . Right hemiparesis (HCC) 07/18/2013  . SVT (supraventricular tachycardia) (HCC) 03/18/2013  .  Need for prophylactic vaccination with combined diphtheria-tetanus-pertussis (DTP) vaccine 09/21/2012  . Seasonal allergies 09/21/2012  . Osteoporosis 09/21/2012  . Routine adult health maintenance 11/27/2010  . INSOMNIA 09/19/2009  . Bilateral lower extremity edema 12/25/2008  . Asthma 07/13/2006  . ANEMIA-IRON DEFICIENCY 03/02/2006  . DEPRESSION 03/02/2006  . Essential hypertension 03/02/2006  . GERD 03/02/2006  . SEIZURE DISORDER 03/02/2006  . CVA (cerebral infarction) 03/02/2006    Zarian Colpitts , PT 03/25/2016, 8:52 PM  [facility] [address] [place], [place], [address] Phone: [phone]   Fax:  [phone]  Name: Wanda [name] MRN: [id number] Date of Birth: 03/23/1956

## 2016-03-26 ENCOUNTER — Telehealth: Payer: Self-pay | Admitting: Internal Medicine

## 2016-03-26 ENCOUNTER — Encounter: Payer: Self-pay | Admitting: Internal Medicine

## 2016-03-26 ENCOUNTER — Ambulatory Visit (INDEPENDENT_AMBULATORY_CARE_PROVIDER_SITE_OTHER): Payer: Medicare Other | Admitting: Internal Medicine

## 2016-03-26 DIAGNOSIS — Z7982 Long term (current) use of aspirin: Secondary | ICD-10-CM | POA: Diagnosis not present

## 2016-03-26 DIAGNOSIS — I872 Venous insufficiency (chronic) (peripheral): Secondary | ICD-10-CM | POA: Diagnosis not present

## 2016-03-26 DIAGNOSIS — R600 Localized edema: Secondary | ICD-10-CM

## 2016-03-26 DIAGNOSIS — L821 Other seborrheic keratosis: Secondary | ICD-10-CM

## 2016-03-26 DIAGNOSIS — Z87891 Personal history of nicotine dependence: Secondary | ICD-10-CM

## 2016-03-26 MED ORDER — GABAPENTIN 300 MG PO CAPS: 300.0000 mg | ORAL_CAPSULE | Freq: Three times a day (TID) | ORAL | 3 refills | Status: DC

## 2016-03-26 NOTE — Progress Notes (Signed)
   CC: skin lesion   HPI:  [name] is a 60 y.o. woman with history noted below that presents to the internal medicine clinic for a skin lesion first noted one month ago and located to her right upper back. She has associated pain with the area. Son was present during the interview and stated that the lesion has been stable for the past month. Patient denies any fever or chills, bleeding in the skin lesion area or rash.  Patient is also complaining of bilateral leg and foot pain. She states this is a chronic issue and is using vaseline to keep area moisterized. She denies fever or chills, any fluid weeping from legs or vesicles in the area.   Past Medical History:  Diagnosis Date  . ANEMIA-IRON DEFICIENCY 03/02/2006   H&H 8.5/25.9 5/09. On 5/13 12.8/39.7 with MCV 80.4.     Marland Kitchen. 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 Depression   . GERD (gastroesophageal reflux disease)   . Hypertension   . Osteopenia   . 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 SLE (systemic lupus erythematosus) (HCC)   . Stroke (HCC) 1991  . SVT (supraventricular tachycardia) (HCC)    a. 03/2013 with mildly elevated troponin at that time.  . Venous insufficiency (chronic) (peripheral)     Review of Systems:  As noted per HPI  Physical Exam:  Vitals:   03/26/16 1544  BP: 129/82  Pulse: 79  Temp: 98 F (36.7 C)  TempSrc: Oral  SpO2: 100%  Weight: 215 lb 12.8 oz (97.9 kg)   Physical Exam  Cardiovascular: Normal rate, regular rhythm and normal heart sounds.  Exam reveals no gallop and no friction rub.   No murmur heard. Pulmonary/Chest: Effort normal and breath sounds normal. No respiratory distress. She has no wheezes. She has no rales.  Musculoskeletal:  Stasis dermatitis bilaterally in lower extremities   Skin:  1cm brown, circular, waxy, scaly elevated skin lesion      Assessment & Plan:   See  encounters tab for problem based medical decision making.   Patient seen with Dr. Oswaldo DoneVincent

## 2016-03-26 NOTE — Patient Instructions (Addendum)
[name],  It was a pleasure meeting you and your son today. Please start wearing your compression stockings in the daytime to help with your leg swelling. I will see you back in 6 months. In the meantime you can make an appointment in the acute care clinic for any acute healthcare needs.

## 2016-03-26 NOTE — Telephone Encounter (Signed)
APT. REMINDER CALL, NO ANSWER, MAILBOX FULL °

## 2016-03-27 ENCOUNTER — Other Ambulatory Visit: Payer: Self-pay

## 2016-03-27 NOTE — Telephone Encounter (Signed)
Son stated call the pharmacy, no refills on Lisinopril. Should have at least 2 refills left. I called [facility] - stated she does have refills and will get ready for pt - called son back/informed.

## 2016-03-27 NOTE — Telephone Encounter (Signed)
Requesting lisinopril to be filled @ [facility].

## 2016-03-29 DIAGNOSIS — L821 Other seborrheic keratosis: Secondary | ICD-10-CM | POA: Insufficient documentation

## 2016-03-29 NOTE — Assessment & Plan Note (Signed)
Assessment: Seborrheic keratosis Patient has a 1 cm waxy, scaly skin lesion on the right side of her upper back. It was first noticed 1 month ago and associated symptoms include pain.     Plan In office cryotherapy

## 2016-03-29 NOTE — Assessment & Plan Note (Signed)
Assessment:  Bilateral leg pain Patient has a history of bilateral leg edema due to chronic venous insufficiency.  We will do in office compression stocking measurements to assess for the right size since patient complains of her current stockings.  Plan -Compression stockings

## 2016-03-30 ENCOUNTER — Ambulatory Visit: Payer: Medicare Other | Admitting: Physical Therapy

## 2016-03-30 ENCOUNTER — Other Ambulatory Visit: Payer: Self-pay | Admitting: *Deleted

## 2016-03-30 MED ORDER — POTASSIUM CHLORIDE ER 10 MEQ PO TBCR: 10.0000 meq | EXTENDED_RELEASE_TABLET | Freq: Two times a day (BID) | ORAL | 0 refills | Status: DC

## 2016-03-31 NOTE — Progress Notes (Signed)
Internal Medicine Clinic Attending  I saw and evaluated the patient.  I personally confirmed the key portions of the history and exam documented by Dr. Mikey BussingHoffman and I reviewed pertinent patient test results.  The assessment, diagnosis, and plan were formulated together and I agree with the documentation in the resident's note. I was present for the entirety of the procedure.   Cryotherapy Procedure Note:   Liquid nitrogen was applied for 10-12 seconds to the skin lesions and the expected blistering or scabbing reaction explained. Do not pick at the areas. Patient reminded to expect hypopigmented scars from the procedure. Return if lesions fail to fully resolve.

## 2016-04-01 ENCOUNTER — Ambulatory Visit: Payer: Medicare Other | Admitting: Rehabilitative and Restorative Service Providers"

## 2016-04-01 ENCOUNTER — Other Ambulatory Visit: Payer: Self-pay | Admitting: Internal Medicine

## 2016-04-01 DIAGNOSIS — R29818 Other symptoms and signs involving the nervous system: Secondary | ICD-10-CM

## 2016-04-01 DIAGNOSIS — M6281 Muscle weakness (generalized): Secondary | ICD-10-CM | POA: Diagnosis not present

## 2016-04-01 DIAGNOSIS — R2681 Unsteadiness on feet: Secondary | ICD-10-CM

## 2016-04-01 NOTE — Telephone Encounter (Signed)
Per pharmacy "This prescription was filled on 04/01/2016. Any refills authorized will be placed on file."

## 2016-04-01 NOTE — Therapy (Signed)
[facility] [address] [place], [place], [address] Phone: [phone]   Fax:  [phone]  Physical Therapy Treatment  Patient Details  Name: Wanda [name] MRN: [id number] Date of Birth: 07/23/1955 Referring Provider: Jule Ser, DO  Encounter Date: 04/01/2016      PT End of Session - 04/01/16 1455    Visit Number 4   Number of Visits 9   Date for PT Re-Evaluation 04/26/16   Authorization Type Medicare traditional primary; Medicaid secondary - G Codes and PN required every 10 visits   PT Start Time 1110   PT Stop Time 1140   PT Time Calculation (min) 30 min   Equipment Utilized During Treatment Gait belt   Activity Tolerance Patient tolerated treatment well   Behavior During Therapy [facility] for tasks assessed/performed      Past Medical History:  Diagnosis Date  . ANEMIA-IRON DEFICIENCY 03/02/2006   H&H 8.5/25.9 5/09. On 5/13 12.8/39.7 with MCV 80.4.     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 Depression   . GERD (gastroesophageal reflux disease)   . Hypertension   . Osteopenia   . Seizures ([place])    secondary to ruptured berry aneurysm  . SLE (systemic lupus erythematosus) ([place])   . Stroke ([place]) 1991  . SVT (supraventricular tachycardia) ([place])    a. 03/2013 with mildly elevated troponin at that time.  . Venous insufficiency (chronic) (peripheral)     Past Surgical History:  Procedure Laterality Date  . CEREBRAL ANEURYSM REPAIR     1990's  . ESOPHAGOGASTRODUODENOSCOPY  2001   Varices and esophagitis  . TOTAL ABDOMINAL HYSTERECTOMY  5/09   Benign cervix, uterine fibroids on bx    There were no vitals filed for this visit.      Subjective Assessment - 04/01/16 1126    Subjective The patient arrived late today beginning session at 11:10.   Patient is accompained by: Family member  Production manager, nurse's aide and her  son   Patient Stated Goals Improving independence in home, teaching caregiver how to help at home.      THERAPEUTIC ACTIVITIES: Standing at sink x 1 minute x 2 reps and 1.5 minutes x 1 reps and 15 sec x 1 rep Patient needs mod A to transition sit>stand pulling up at sink and CGA to stay standing with verbal cues on upright posture.  Scooting in w/c ant/post with verbal cues Leaning to place sliding board with verbal cues today  Sliding board initial scoot is uphill due to w/c metal frame, so PT provided first scoot mod A and then patient able to complete transfer with CGA w/c<>mat.  Gait: Patient inquired about standing and son notes he walks short distances with her at home. PT had patient stand in parallel bars and placed standard chair in bars for rest breaks.  Ambulated 4 feet x 2 reps, then 2 feet with mod A using L UE and parallel bar for support with +2 assist moving chair. Then transferred using stand pivot back to w/c with mod A.         PT Short Term Goals - 03/12/16 1507      PT SHORT TERM GOAL #1   Title STG's = LTG's           PT Long Term Goals - 04/01/16 1504      PT LONG TERM GOAL #1   Title Pt  will effectively perform home stretching program with assist of caregiver (son or [facility] aide) with no cueing from this PT to maximize functional gains made in therapy.      Baseline Target date 04/26/2016   Time 4   Period Weeks     PT LONG TERM GOAL #2   Title Caregiver will verbalize understanding of frequency/duration of pressure relief and assist pt in effectively performing pressure relief to preserve skin integrity.   Baseline Target date 04/26/2016   Time 4   Period Weeks     PT LONG TERM GOAL #3   Title Pt will perform supine <> sit with min A, to demo increased independence getting into/out of bed.   Baseline Target date 04/26/2016   Time 4   Period Weeks     PT LONG TERM GOAL #4   Title Pt will perform supine <>sidelying rolling for improved assist  with dressing/ADL tasks and positioning in bed with supervision and verbal cues.    Baseline Target date 04/26/2016   Time 4   Period Weeks     PT LONG TERM GOAL #5   Title Pt will perform level transfers from w/c <> mat table with CGA to decrease caregiver burden.     Baseline Target date 04/26/2016   Time 4   Period Weeks     PT LONG TERM GOAL #6   Title Pt will perform static standing with LUE support at countertop x15 seconds with min guard to increase pt/caregiver safety with LB dressing.     Baseline MET on 11/15 with patient standing with min A x 1.5 minutes.  She needs mod A to transition to stand, but can maintain standing with min A.    Time 4   Period Weeks     PT LONG TERM GOAL #7   Title Assess current R AFO for appropriate fit and make recommendations to maximize pt safety with transfers, maintain joint positioning/alignment.     Baseline MET on 11/15.  Current AFO assists with ankle position and improves transfers and patient safety.   Time 4   Period Weeks               Plan - 04/01/16 1455    Clinical Impression Statement The patient tolerated increased standing today.  She has nonverbal signs of discomfort and points to right knee and calf area as painful when sitting after standing.   Patient's AFO provides good ankle stability during standing and provides greater balance during transfers (as caregiver notes).  Patient is also demonstrating improved leaning and scooting in w/c which assists with pressure relief.   PT Treatment/Interventions ADLs/Self Care Home Management;Functional mobility training;Therapeutic activities;Therapeutic exercise;Balance training;DME Instruction;Neuromuscular re-education;Cognitive remediation;Patient/family education;Orthotic Fit/Training   PT Next Visit Plan Review HEP for spasticity mgmt as needed, standing at sink with AFO (static progressive orthoses) donned, bed mobility, sliding board transfers   Consulted and Agree with Plan  of Care Patient;Family member/caregiver   Family Member Consulted Son, Beverely Low; and [facility] Aide      Patient will benefit from skilled therapeutic intervention in order to improve the following deficits and impairments:  Postural dysfunction, Impaired tone, Cardiopulmonary status limiting activity, Increased edema, Decreased balance, Decreased activity tolerance, Decreased endurance, Decreased skin integrity, Impaired sensation, Decreased strength, Impaired flexibility, Impaired UE functional use, Decreased mobility, Decreased coordination  Visit Diagnosis: Muscle weakness (generalized)  Unsteadiness  Other symptoms and signs involving the nervous system     Problem List Patient Active Problem List  Diagnosis Date Noted  . Seborrheic keratoses 03/29/2016  . Intertrigo 01/31/2016  . Posterior pain of left hip 01/31/2016  . Rash and nonspecific skin eruption 09/12/2015  . Constipation 07/05/2015  . Neck muscle strain 05/09/2015  . Diastolic dysfunction 58/01/9832  . Right hemiparesis ([place]) 07/18/2013  . SVT (supraventricular tachycardia) ([place]) 03/18/2013  . Need for prophylactic vaccination with combined diphtheria-tetanus-pertussis (DTP) vaccine 09/21/2012  . Seasonal allergies 09/21/2012  . Osteoporosis 09/21/2012  . Routine adult health maintenance 11/27/2010  . INSOMNIA 09/19/2009  . Bilateral lower extremity edema 12/25/2008  . Asthma 07/13/2006  . ANEMIA-IRON DEFICIENCY 03/02/2006  . DEPRESSION 03/02/2006  . Essential hypertension 03/02/2006  . GERD 03/02/2006  . SEIZURE DISORDER 03/02/2006  . CVA (cerebral infarction) 03/02/2006    Treina Arscott , PT 04/01/2016, 3:06 PM  [facility] [address] [place], [place], [address] Phone: [phone]   Fax:  [phone]  Name: GEORGEAN SPAINHOWER MRN: [id number] Date of Birth: March 01, 1956

## 2016-04-02 ENCOUNTER — Ambulatory Visit: Payer: Medicare Other | Admitting: Physical Therapy

## 2016-04-06 ENCOUNTER — Ambulatory Visit: Payer: Medicare Other | Admitting: Rehabilitative and Restorative Service Providers"

## 2016-04-06 DIAGNOSIS — R2681 Unsteadiness on feet: Secondary | ICD-10-CM | POA: Diagnosis not present

## 2016-04-06 DIAGNOSIS — M6281 Muscle weakness (generalized): Secondary | ICD-10-CM | POA: Diagnosis not present

## 2016-04-06 DIAGNOSIS — R29818 Other symptoms and signs involving the nervous system: Secondary | ICD-10-CM | POA: Diagnosis not present

## 2016-04-06 NOTE — Therapy (Signed)
[facility] [address]  [place], [place], [address] Phone: [phone]   Fax:  [phone]  Physical Therapy Treatment  Patient Details  Name: Wanda [name] MRN: [id number] Date of Birth: November 24, 1955 Referring Provider: Jule Ser, DO  Encounter Date: 04/06/2016      PT End of Session - 04/06/16 1450    Visit Number 5   Number of Visits 9   Date for PT Re-Evaluation 04/26/16   Authorization Type Medicare traditional primary; Medicaid secondary - G Codes and PN required every 10 visits   PT Start Time 1155   PT Stop Time 1228   PT Time Calculation (min) 33 min   Equipment Utilized During Treatment Gait belt   Activity Tolerance Patient tolerated treatment well;Patient limited by pain  pain occurs during standing   Behavior During Therapy [facility] for tasks assessed/performed      Past Medical History:  Diagnosis Date  . ANEMIA-IRON DEFICIENCY 03/02/2006   H&H 8.5/25.9 5/09. On 5/13 12.8/39.7 with MCV 80.4.     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 Depression   . GERD (gastroesophageal reflux disease)   . Hypertension   . Osteopenia   . Seizures ([place])    secondary to ruptured berry aneurysm  . SLE (systemic lupus erythematosus) ([place])   . Stroke ([place]) 1991  . SVT (supraventricular tachycardia) ([place])    a. 03/2013 with mildly elevated troponin at that time.  . Venous insufficiency (chronic) (peripheral)     Past Surgical History:  Procedure Laterality Date  . CEREBRAL ANEURYSM REPAIR     1990's  . ESOPHAGOGASTRODUODENOSCOPY  2001   Varices and esophagitis  . TOTAL ABDOMINAL HYSTERECTOMY  5/09   Benign cervix, uterine fibroids on bx    There were no vitals filed for this visit.      Subjective Assessment - 04/06/16 1449    Subjective The patient arrived late today beginning session at 11:55.  Her caregiver got help  from front desk to transfer patient from car>w/c.   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       THERAPEUTIC ACTIVITIES: W/c>mat transfer set up with PT removing R leg rest and locking R break.  PT initially placed sliding board, however due to tall w/c lock/brake, it was going to limit sliding board transfer.  PT instead educated patient on anterior lean for boost transfer and transferred with min to mod A.  Scooting edge of mat to practice head/hip for functional mobility  Returned to w/c with sliding board placed anteriorly to brake/wheel lock with CGA scooting and home aide holding w/c  W/c>car transfer initially beginning as a sliding board transfer, however extensive space between w/c and car set.  Therefore, had patient's L UE reach out to dashboard and then performed a squat pivot with PT facilitating anterior leaning x mod A *patient's caregiver was asked to hold w/c and did not engage during transfer.  Sit<>stand x 3 reps with mod A Once standing held x 30 seconds x 3 reps with some discomfort noted per non-verbals in R LE    THERAPEUTIC EXERCISE: Seated long arc quads x 10 reps R side Seated hamstring  stretch      PT Short Term Goals - 03/12/16 1507      PT SHORT TERM GOAL #1   Title STG's = LTG's           PT Long Term Goals - 04/01/16 1504      PT LONG TERM GOAL #1   Title Pt will effectively perform home stretching program with assist of caregiver (son or [facility] aide) with no cueing from this PT to maximize functional gains made in therapy.      Baseline Target date 04/26/2016   Time 4   Period Weeks     PT LONG TERM GOAL #2   Title Caregiver will verbalize understanding of frequency/duration of  pressure relief and assist pt in effectively performing pressure relief to preserve skin integrity.   Baseline Target date 04/26/2016   Time 4   Period Weeks     PT LONG TERM GOAL #3   Title Pt will perform supine <> sit with min A, to demo increased independence getting into/out of bed.   Baseline Target date 04/26/2016   Time 4   Period Weeks     PT LONG TERM GOAL #4   Title Pt will perform supine <>sidelying rolling for improved assist with dressing/ADL tasks and positioning in bed with supervision and verbal cues.    Baseline Target date 04/26/2016   Time 4   Period Weeks     PT LONG TERM GOAL #5   Title Pt will perform level transfers from w/c <> mat table with CGA to decrease caregiver burden.     Baseline Target date 04/26/2016   Time 4   Period Weeks     PT LONG TERM GOAL #6   Title Pt will perform static standing with LUE support at countertop x15 seconds with min guard to increase pt/caregiver safety with LB dressing.     Baseline MET on 11/15 with patient standing with min A x 1.5 minutes.  She needs mod A to transition to stand, but can maintain standing with min A.    Time 4   Period Weeks     PT LONG TERM GOAL #7   Title Assess current R AFO for appropriate fit and make recommendations to maximize pt safety with transfers, maintain joint positioning/alignment.     Baseline MET on 11/15.  Current AFO assists with ankle position and improves transfers and patient safety.   Time 4   Period Weeks               Plan - 04/06/16 1452    Clinical Impression Statement PT was initially attempting to train caregiver in car<>w/c transfers, however at today's session, the caregiver notes that she is not supposed to transport patient and that is why she cannot be responsible to transfer her at our clinic.    PT Treatment/Interventions ADLs/Self Care Home Management;Functional mobility training;Therapeutic activities;Therapeutic exercise;Balance training;DME  Instruction;Neuromuscular re-education;Cognitive remediation;Patient/family education;Orthotic Fit/Training   PT Next Visit Plan Begin checking LTGs, discuss carryover of HEP to home environment. Discuss with caregivers buying a sliding board   Consulted and Agree with Plan of Care Patient      Patient will benefit from skilled therapeutic intervention in order to improve the following deficits and impairments:     Visit Diagnosis: Muscle weakness (generalized)  Unsteadiness  Other symptoms and signs involving the nervous system     Problem List Patient Active Problem List   Diagnosis Date Noted  . Seborrheic keratoses 03/29/2016  .  Intertrigo 01/31/2016  . Posterior pain of left hip 01/31/2016  . Rash and nonspecific skin eruption 09/12/2015  . Constipation 07/05/2015  . Neck muscle strain 05/09/2015  . Diastolic dysfunction 00/86/7619  . Right hemiparesis ([place]) 07/18/2013  . SVT (supraventricular tachycardia) ([place]) 03/18/2013  . Need for prophylactic vaccination with combined diphtheria-tetanus-pertussis (DTP) vaccine 09/21/2012  . Seasonal allergies 09/21/2012  . Osteoporosis 09/21/2012  . Routine adult health maintenance 11/27/2010  . INSOMNIA 09/19/2009  . Bilateral lower extremity edema 12/25/2008  . Asthma 07/13/2006  . ANEMIA-IRON DEFICIENCY 03/02/2006  . DEPRESSION 03/02/2006  . Essential hypertension 03/02/2006  . GERD 03/02/2006  . SEIZURE DISORDER 03/02/2006  . CVA (cerebral infarction) 03/02/2006    [place], PT 04/06/2016, 2:54 PM  [facility] [address] [place], [place], [address] Phone: [phone]   Fax:  [phone]  Name: Wanda [name] MRN: [id number] Date of Birth: 03/14/1956

## 2016-04-08 ENCOUNTER — Other Ambulatory Visit: Payer: Self-pay | Admitting: Internal Medicine

## 2016-04-08 ENCOUNTER — Ambulatory Visit: Payer: Medicare Other | Admitting: Rehabilitative and Restorative Service Providers"

## 2016-04-08 DIAGNOSIS — R2681 Unsteadiness on feet: Secondary | ICD-10-CM

## 2016-04-08 DIAGNOSIS — I639 Cerebral infarction, unspecified: Secondary | ICD-10-CM

## 2016-04-08 DIAGNOSIS — M6281 Muscle weakness (generalized): Secondary | ICD-10-CM | POA: Diagnosis not present

## 2016-04-08 DIAGNOSIS — R29818 Other symptoms and signs involving the nervous system: Secondary | ICD-10-CM

## 2016-04-08 NOTE — Therapy (Signed)
[facility] [address] [place], [place], [address] Phone: [phone]   Fax:  [phone]  Physical Therapy Treatment  Patient Details  Name: Wanda [name] MRN: [id number] Date of Birth: 11/07/1955 Referring Provider: Jule Ser, DO  Encounter Date: 04/08/2016      PT End of Session - 04/08/16 1521    Visit Number 6   Number of Visits 9   Date for PT Re-Evaluation 04/26/16   Authorization Type Medicare traditional primary; Medicaid secondary - G Codes and PN required every 10 visits   PT Start Time 1245   PT Stop Time 1316   PT Time Calculation (min) 31 min   Equipment Utilized During Treatment Gait belt   Activity Tolerance Patient tolerated treatment well;Patient limited by pain  pain occurs during standing   Behavior During Therapy [facility] for tasks assessed/performed      Past Medical History:  Diagnosis Date  . ANEMIA-IRON DEFICIENCY 03/02/2006   H&H 8.5/25.9 5/09. On 5/13 12.8/39.7 with MCV 80.4.     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 Depression   . GERD (gastroesophageal reflux disease)   . Hypertension   . Osteopenia   . Seizures ([place])    secondary to ruptured berry aneurysm  . SLE (systemic lupus erythematosus) ([place])   . Stroke ([place]) 1991  . SVT (supraventricular tachycardia) ([place])    a. 03/2013 with mildly elevated troponin at that time.  . Venous insufficiency (chronic) (peripheral)     Past Surgical History:  Procedure Laterality Date  . CEREBRAL ANEURYSM REPAIR     1990's  . ESOPHAGOGASTRODUODENOSCOPY  2001   Varices and esophagitis  . TOTAL ABDOMINAL HYSTERECTOMY  5/09   Benign cervix, uterine fibroids on bx    There were no vitals filed for this visit.      Subjective Assessment - 04/08/16 1300    Subjective The patient arrived today without her son and with caregiver who states she is not  able to transfer her into/out of car--, therefore PT transferred car<>w/c as part of her session.   Patient Stated Goals Improving independence in home, teaching caregiver how to help at home.   Currently in Pain? No/denies     THERAPEUTIC ACTIVITIES: Squat pivot transfers chosen today due to brake on w/c being high for sliding board Car>w/c with squat pivot transfer min to mod A Sit<>stand x 2 reps x 30 seconds with mod A and then requiring min A to remain standing---patient noted to have shortness of breath today and BP=152/85 and HR=83 *caregiver notes patient has had cold W/c>mat with min to mod A squat pivot Sit>supine with min A and then mod A to return to sit due to shortness of breath Remained supine and attempted to get comfortable using wedge to lift chest--still short of breath with wedge, therefore returned to sitting Transferred back to w/c with mod A *patient sat mid way during boost transfer and caught wheel of chair, then completed transfer with assist into chair  SELF CARE/HOME MANAGEMENT: Discussed home program- patient shakes her head "no"--they are not performing.  PT reviewed goals of therapy with caregiver and recommended family attend our sessions to have carryover for home environment.       PT Education - 04/08/16 1520    Education provided Yes   Education Details Discussed with caregiver rescheduling visits until family can attend to optimize carryover to home.  Also discussed functional nature of car transfers---more meaningful if family can attend to carryover recommendations.    Person(s) Educated Theatre stage manager)   Methods Explanation   Comprehension Verbalized understanding          PT Short Term Goals - 03/12/16 1507      PT SHORT TERM GOAL #1   Title STG's = LTG's           PT Long Term Goals - 04/01/16 1504      PT LONG TERM GOAL #1   Title Pt will effectively perform home stretching program with assist of caregiver (son or [facility] aide) with no  cueing from this PT to maximize functional gains made in therapy.      Baseline Target date 04/26/2016   Time 4   Period Weeks     PT LONG TERM GOAL #2   Title Caregiver will verbalize understanding of frequency/duration of pressure relief and assist pt in effectively performing pressure relief to preserve skin integrity.   Baseline Target date 04/26/2016   Time 4   Period Weeks     PT LONG TERM GOAL #3   Title Pt will perform supine <> sit with min A, to demo increased independence getting into/out of bed.   Baseline Target date 04/26/2016   Time 4   Period Weeks     PT LONG TERM GOAL #4   Title Pt will perform supine <>sidelying rolling for improved assist with dressing/ADL tasks and positioning in bed with supervision and verbal cues.    Baseline Target date 04/26/2016   Time 4   Period Weeks     PT LONG TERM GOAL #5   Title Pt will perform level transfers from w/c <> mat table with CGA to decrease caregiver burden.     Baseline Target date 04/26/2016   Time 4   Period Weeks     PT LONG TERM GOAL #6   Title Pt will perform static standing with LUE support at countertop x15 seconds with min guard to increase pt/caregiver safety with LB dressing.     Baseline MET on 11/15 with patient standing with min A x 1.5 minutes.  She needs mod A to transition to stand, but can maintain standing with min A.    Time 4   Period Weeks     PT LONG TERM GOAL #7   Title Assess current R AFO for appropriate fit and make recommendations to maximize pt safety with transfers, maintain joint positioning/alignment.     Baseline MET on 11/15.  Current AFO assists with ankle position and improves transfers and patient safety.   Time 4   Period Weeks               Plan - 04/08/16 1521    Clinical Impression Statement The patient's caregiver and PT reviewed goals of therapy and need to improve current carryover to home to make sessions more meaningful recommending patient's son attend  sessions if able--current caregiver does not transfer into/out of car, or perform home standing, or home exercises.  PT to continue working towards remaining LTGs and d/c within next 3 sessions.    PT Treatment/Interventions ADLs/Self Care Home Management;Functional mobility training;Therapeutic activities;Therapeutic exercise;Balance training;DME Instruction;Neuromuscular re-education;Cognitive remediation;Patient/family education;Orthotic Fit/Training   PT Next Visit Plan Begin checking LTGs, discuss carryover of HEP to home environment. Discuss with caregivers buying a sliding board   Consulted and Agree with Plan of Care Patient   Family Member Consulted [facility] aide  Patient will benefit from skilled therapeutic intervention in order to improve the following deficits and impairments:  Postural dysfunction, Impaired tone, Cardiopulmonary status limiting activity, Increased edema, Decreased balance, Decreased activity tolerance, Decreased endurance, Decreased skin integrity, Impaired sensation, Decreased strength, Impaired flexibility, Impaired UE functional use, Decreased mobility, Decreased coordination  Visit Diagnosis: Muscle weakness (generalized)  Unsteadiness  Other symptoms and signs involving the nervous system     Problem List Patient Active Problem List   Diagnosis Date Noted  . Seborrheic keratoses 03/29/2016  . Intertrigo 01/31/2016  . Posterior pain of left hip 01/31/2016  . Rash and nonspecific skin eruption 09/12/2015  . Constipation 07/05/2015  . Neck muscle strain 05/09/2015  . Diastolic dysfunction 73/95/8441  . Right hemiparesis ([place]) 07/18/2013  . SVT (supraventricular tachycardia) ([place]) 03/18/2013  . Need for prophylactic vaccination with combined diphtheria-tetanus-pertussis (DTP) vaccine 09/21/2012  . Seasonal allergies 09/21/2012  . Osteoporosis 09/21/2012  . Routine adult health maintenance 11/27/2010  . INSOMNIA 09/19/2009  . Bilateral lower extremity  edema 12/25/2008  . Asthma 07/13/2006  . ANEMIA-IRON DEFICIENCY 03/02/2006  . DEPRESSION 03/02/2006  . Essential hypertension 03/02/2006  . GERD 03/02/2006  . SEIZURE DISORDER 03/02/2006  . CVA (cerebral infarction) 03/02/2006    [place], PT 04/08/2016, 3:23 PM  [facility] [address] [place], [place], [address] Phone: [phone]   Fax:  [phone]  Name: KEISHAWNA CARRANZA MRN: [id number] Date of Birth: [birth year]

## 2016-04-13 ENCOUNTER — Other Ambulatory Visit: Payer: Self-pay | Admitting: Internal Medicine

## 2016-04-13 ENCOUNTER — Ambulatory Visit: Payer: Medicare Other | Admitting: Physical Therapy

## 2016-04-15 ENCOUNTER — Telehealth: Payer: Self-pay

## 2016-04-15 ENCOUNTER — Ambulatory Visit: Payer: Medicare Other | Admitting: Rehabilitative and Restorative Service Providers"

## 2016-04-16 ENCOUNTER — Telehealth: Payer: Self-pay

## 2016-04-16 NOTE — Telephone Encounter (Signed)
Called pt's caregiver back, she states that pt was told at last visit just to call if she needed anything for a cough, she states pt does not need an appt. Denies fever.

## 2016-04-16 NOTE — Telephone Encounter (Signed)
Requesting cough med. Please call back.  

## 2016-04-20 ENCOUNTER — Other Ambulatory Visit: Payer: Self-pay | Admitting: Internal Medicine

## 2016-04-20 DIAGNOSIS — I471 Supraventricular tachycardia: Secondary | ICD-10-CM

## 2016-04-20 DIAGNOSIS — I10 Essential (primary) hypertension: Secondary | ICD-10-CM

## 2016-04-23 ENCOUNTER — Other Ambulatory Visit: Payer: Self-pay | Admitting: *Deleted

## 2016-04-23 DIAGNOSIS — I10 Essential (primary) hypertension: Secondary | ICD-10-CM

## 2016-04-23 MED ORDER — HYDROCHLOROTHIAZIDE 25 MG PO TABS: 25.0000 mg | ORAL_TABLET | Freq: Every day | ORAL | 3 refills | Status: DC

## 2016-04-24 NOTE — Telephone Encounter (Signed)
Since this has been 8 days I dont know if pt is still in need or not, please advise

## 2016-04-24 NOTE — Telephone Encounter (Signed)
Please have her follow up in the acute care clinic if she continues to have a cough

## 2016-04-27 NOTE — Telephone Encounter (Signed)
Caregiver states they are using robitussin, if it doesn't help they will call for an appt

## 2016-04-29 ENCOUNTER — Other Ambulatory Visit: Payer: Self-pay | Admitting: Internal Medicine

## 2016-05-04 ENCOUNTER — Ambulatory Visit: Payer: Medicare Other | Attending: Internal Medicine | Admitting: Rehabilitative and Restorative Service Providers"

## 2016-05-04 DIAGNOSIS — R2681 Unsteadiness on feet: Secondary | ICD-10-CM | POA: Insufficient documentation

## 2016-05-04 DIAGNOSIS — R29818 Other symptoms and signs involving the nervous system: Secondary | ICD-10-CM | POA: Insufficient documentation

## 2016-05-04 DIAGNOSIS — M6281 Muscle weakness (generalized): Secondary | ICD-10-CM | POA: Insufficient documentation

## 2016-05-05 ENCOUNTER — Ambulatory Visit (INDEPENDENT_AMBULATORY_CARE_PROVIDER_SITE_OTHER): Payer: Medicare Other | Admitting: Pulmonary Disease

## 2016-05-05 ENCOUNTER — Encounter: Payer: Self-pay | Admitting: Pulmonary Disease

## 2016-05-05 ENCOUNTER — Ambulatory Visit (HOSPITAL_COMMUNITY)
Admission: RE | Admit: 2016-05-05 | Discharge: 2016-05-05 | Disposition: A | Discharge status: Home / Self Care | Payer: Medicare Other | Source: Ambulatory Visit | Attending: Pulmonary Disease | Admitting: Pulmonary Disease

## 2016-05-05 VITALS — BP 129/80 | HR 90 | Temp 98.2°F | Wt 210.4 lb

## 2016-05-05 DIAGNOSIS — Z87891 Personal history of nicotine dependence: Secondary | ICD-10-CM

## 2016-05-05 DIAGNOSIS — R109 Unspecified abdominal pain: Secondary | ICD-10-CM | POA: Insufficient documentation

## 2016-05-05 DIAGNOSIS — R05 Cough: Secondary | ICD-10-CM

## 2016-05-05 DIAGNOSIS — R918 Other nonspecific abnormal finding of lung field: Secondary | ICD-10-CM | POA: Insufficient documentation

## 2016-05-05 DIAGNOSIS — R059 Cough, unspecified: Secondary | ICD-10-CM

## 2016-05-05 DIAGNOSIS — Z7982 Long term (current) use of aspirin: Secondary | ICD-10-CM | POA: Diagnosis not present

## 2016-05-05 DIAGNOSIS — R1032 Left lower quadrant pain: Secondary | ICD-10-CM | POA: Diagnosis not present

## 2016-05-05 MED ORDER — DOCUSATE SODIUM 100 MG PO CAPS: 100.0000 mg | ORAL_CAPSULE | Freq: Two times a day (BID) | ORAL | 0 refills | Status: DC

## 2016-05-05 MED ORDER — BENZONATATE 200 MG PO CAPS
200.0000 mg | ORAL_CAPSULE | Freq: Three times a day (TID) | ORAL | 0 refills | Status: DC | PRN
Start: 2016-05-05 — End: 2016-05-28

## 2016-05-05 MED ORDER — DOXYCYCLINE HYCLATE 100 MG PO CAPS: 100.0000 mg | ORAL_CAPSULE | Freq: Two times a day (BID) | ORAL | 0 refills | Status: DC

## 2016-05-05 NOTE — Assessment & Plan Note (Signed)
Assessment: Cough productive of thick green sputum for the past two weeks that is worsening. Concerning for acute bacterial bronchitis versus community acquired pneumonia. She may have underlying COPD given her smoking history and intermittent albuterol use. No wheezes on exam today though.  Plan: Doxycycline 100mg  BID for 5 days CXR Return precautions discussed.

## 2016-05-05 NOTE — Patient Instructions (Signed)
Please take your antibiotic twice a day for 5 days. You may take the cough medicine up to three times a day as needed for cough Follow up in 6 to 8 weeks

## 2016-05-05 NOTE — Assessment & Plan Note (Signed)
Assessment: Left lower quadrant abdominal pain for the past week. She admits to being constipated with irregular bowel movements. No melena or hematochezia. No nausea, vomiting. No diarrhea. Most consistent with constipation.  Plan: Docusate BID for now Discussed return precautions

## 2016-05-05 NOTE — Progress Notes (Signed)
   CC: cough  HPI:  [name] is a 60 y.o. woman with history as noted below presenting with cough.  Coughing for the past two weeks. Coughing up thick dark green mucous now. No sick contacts. No hemoptysis. No fevers but has had chills. No sinus pressure. She has rhinorrhea. She has dyspnea and wheezing. No chest pain. Tried Mucinex, Theraflu. Last had something similar less than a year ago. Quit smoking in the 1990s. 1 pack per day at the time.   Past Medical History:  Diagnosis Date  . ANEMIA-IRON DEFICIENCY 03/02/2006   H&H 8.5/25.9 5/09. On 5/13 12.8/39.7 with MCV 80.4.     Marland Kitchen. 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 Depression   . GERD (gastroesophageal reflux disease)   . Hypertension   . Osteopenia   . 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 SLE (systemic lupus erythematosus) ([facility])   . Stroke ([facility]) 1991  . SVT (supraventricular tachycardia) ([facility])    a. 03/2013 with mildly elevated troponin at that time.  . Venous insufficiency (chronic) (peripheral)     Review of Systems:   No nausea/vomiting No dysuria  Physical Exam:  Vitals:   05/05/16 0945  BP: 129/80  Pulse: 90  Temp: 98.2 F (36.8 C)  TempSrc: Oral  SpO2: 98%  Weight: 210 lb 6.4 oz (95.4 kg)   General Apperance: NAD HEENT: Normocephalic, atraumatic, anicteric sclera Neck: Supple, trachea midline Lungs: left sided rhonchi, no wheezes Heart: Regular rate and rhythm, no murmur/rub/gallop Abdomen: Soft, nontender, nondistended, no rebound/guarding Extremities: Warm and well perfused, no edema Skin: No rashes or lesions Neurologic: Alert and interactive. No gross deficits.  I independently reviewed the chest xray. No focal consolidation appreciated.  Assessment & Plan:   See Encounters Tab for problem based charting.  Patient discussed with Dr. Criselda PeachesMullen

## 2016-05-08 NOTE — Progress Notes (Signed)
Internal Medicine Clinic Attending  Case discussed with Dr. Krall soon after the resident saw the patient.  We reviewed the resident's history and exam and pertinent patient test results.  I agree with the assessment, diagnosis, and plan of care documented in the resident's note. 

## 2016-05-20 ENCOUNTER — Ambulatory Visit (HOSPITAL_COMMUNITY)
Admission: RE | Admit: 2016-05-20 | Discharge: 2016-05-20 | Disposition: A | Discharge status: Home / Self Care | Payer: Medicare Other | Source: Ambulatory Visit | Attending: Internal Medicine | Admitting: Internal Medicine

## 2016-05-20 ENCOUNTER — Other Ambulatory Visit: Payer: Self-pay | Admitting: Internal Medicine

## 2016-05-20 ENCOUNTER — Ambulatory Visit (INDEPENDENT_AMBULATORY_CARE_PROVIDER_SITE_OTHER): Payer: Medicare Other | Admitting: Internal Medicine

## 2016-05-20 VITALS — BP 125/65 | HR 78 | Temp 97.8°F | Wt 215.4 lb

## 2016-05-20 DIAGNOSIS — R1032 Left lower quadrant pain: Secondary | ICD-10-CM | POA: Diagnosis not present

## 2016-05-20 DIAGNOSIS — M5136 Other intervertebral disc degeneration, lumbar region: Secondary | ICD-10-CM | POA: Insufficient documentation

## 2016-05-20 DIAGNOSIS — M810 Age-related osteoporosis without current pathological fracture: Secondary | ICD-10-CM | POA: Diagnosis not present

## 2016-05-20 DIAGNOSIS — Z87891 Personal history of nicotine dependence: Secondary | ICD-10-CM | POA: Diagnosis not present

## 2016-05-20 DIAGNOSIS — M1612 Unilateral primary osteoarthritis, left hip: Secondary | ICD-10-CM | POA: Diagnosis not present

## 2016-05-20 DIAGNOSIS — K5900 Constipation, unspecified: Secondary | ICD-10-CM

## 2016-05-20 DIAGNOSIS — M8588 Other specified disorders of bone density and structure, other site: Secondary | ICD-10-CM | POA: Insufficient documentation

## 2016-05-20 DIAGNOSIS — M160 Bilateral primary osteoarthritis of hip: Secondary | ICD-10-CM | POA: Diagnosis not present

## 2016-05-20 DIAGNOSIS — I69351 Hemiplegia and hemiparesis following cerebral infarction affecting right dominant side: Secondary | ICD-10-CM | POA: Diagnosis not present

## 2016-05-20 DIAGNOSIS — M25552 Pain in left hip: Secondary | ICD-10-CM | POA: Diagnosis not present

## 2016-05-20 DIAGNOSIS — Z7982 Long term (current) use of aspirin: Secondary | ICD-10-CM | POA: Diagnosis not present

## 2016-05-20 DIAGNOSIS — I471 Supraventricular tachycardia: Secondary | ICD-10-CM

## 2016-05-20 DIAGNOSIS — M47816 Spondylosis without myelopathy or radiculopathy, lumbar region: Secondary | ICD-10-CM | POA: Diagnosis not present

## 2016-05-20 DIAGNOSIS — M545 Low back pain: Secondary | ICD-10-CM | POA: Diagnosis present

## 2016-05-20 DIAGNOSIS — I700 Atherosclerosis of aorta: Secondary | ICD-10-CM | POA: Diagnosis not present

## 2016-05-20 DIAGNOSIS — I10 Essential (primary) hypertension: Secondary | ICD-10-CM

## 2016-05-20 MED ORDER — POLYETHYLENE GLYCOL 3350 17 G PO PACK: PACK | ORAL | 0 refills | Status: DC

## 2016-05-20 NOTE — Assessment & Plan Note (Signed)
Patient reports she has continued left hip pain. She reports to the pain as being posterior hip and describes nonradiating aching pain. She reports that occasionally she does have sharp pain which radiates down her leg but this is not normal. She has tight muscles bilaterally and has some difficulty with range of motion. Pain does not radiate to her groin she has intact sensation bilaterally in the lower extremity. Patient does have right hemiparesis and may have some compensatory overuse of left lower extremity as result. She's been trying Voltaren gel for this without significant relief. Patient denies any significant traumatic history but does have osteoporosis currently on Prolia.  We will reassess her hip and low back with plain films today in order to rule out fragility fracture given persistence of pain that was unresponsive to physical therapy and nonsteroidal therapy. However feel that her pain may also be related to underlying chronic constipation which has worsened recently. Please see below for further details.

## 2016-05-20 NOTE — Assessment & Plan Note (Signed)
Patient is a history of constipation reports that she does not have regular bowel movements. Last, was Saturday. At this time she will required straining to pass her stool which was large and hard. She denies any blood in the stool or on the toilet tissue. No dark tarry stools. She reports that she just started taking MiraLAX yesterday, but has not had any bowel movement since. I recommend she increase her bowel regimen with MiraLAX 3 times a day and then de-escalate after bowel movement. I will follow up with her several days by phone in order to reassess whether we need to advance with bowel stimulant or enema at that time.  Given her vague abdominal pain may be contributing to this left-sided hip pain she is complaining about and would like to reassess hip pain after better control of constipation is achieved. If her symptoms resolve with bowel regimen would recommend she continue her regular schedule follow-up in May with her PCP.

## 2016-05-20 NOTE — Patient Instructions (Signed)
I think that your left lower abdominal pain may be related to constipation. I would recommend taking MiraLAX 3 times a day and to have a bowel movement and then backing down to 2 times a day then 1 time a day as needed for constipation. I would also recommend increasing the fiber you are taking in your diet.  Given her osteoporosis and continued left hip pain which is aggravated with movement I would like to evaluate your left hip and lower back with some simple x-rays. I will give you a call with these x-ray results are back and we can follow-up on progress with your constipation at that time.

## 2016-05-20 NOTE — Progress Notes (Signed)
CC: L side pain HPI: Ms. Wanda PrimesShirley A [name] is a 61 y.o. female with a h/o of siezure d/o, osteoporosis, asthma, HTN, GERD, constipation, and h/o CVA who presents for f/u and eval of LLQ abd pain and L post hip pain.  Please see Problem-based charting for HPI and the status of patient's chronic medical conditions.  Past Medical History:  Diagnosis Date  . ANEMIA-IRON DEFICIENCY 03/02/2006   H&H 8.5/25.9 5/09. On 5/13 12.8/39.7 with MCV 80.4.     Marland Kitchen. 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 Depression   . GERD (gastroesophageal reflux disease)   . Hypertension   . Osteopenia   . 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 SLE (systemic lupus erythematosus) (HCC)   . Stroke (HCC) 1991  . SVT (supraventricular tachycardia) (HCC)    a. 03/2013 with mildly elevated troponin at that time.  . Venous insufficiency (chronic) (peripheral)     Social Hx: Social History  Substance Use Topics  . Smoking status: Former Smoker    Packs/day: 1.50    Types: Cigarettes    Quit date: 05/30/2003  . Smokeless tobacco: Never Used     Comment: she smoked for 30 years before she quit  . Alcohol use No   Review of Systems: ROS in HPI. Otherwise: Review of Systems  Constitutional: Negative for chills, fever and weight loss.  Respiratory: Negative for cough and shortness of breath.   Cardiovascular: Negative for chest pain and leg swelling.  Gastrointestinal: Positive for abdominal pain, constipation and nausea. Negative for blood in stool, diarrhea, melena and vomiting.  Genitourinary: Negative for dysuria, frequency and urgency.  Musculoskeletal: Positive for joint pain.    Physical Exam: Vitals:   05/20/16 1041  BP: 125/65  Pulse: 78  Temp: 97.8 F (36.6 C)  TempSrc: Oral  Weight: 215 lb 6.4 oz (97.7 kg)   Physical Exam  Constitutional: She appears well-developed. She is cooperative. No distress.    Cardiovascular: Normal rate, regular rhythm, normal heart sounds and normal pulses.  Exam reveals no gallop.   No murmur heard. Pulmonary/Chest: Effort normal and breath sounds normal. No respiratory distress. She has no wheezes. She has no rhonchi. She has no rales. Breasts are symmetrical.  Abdominal: Soft. Bowel sounds are normal. There is tenderness.  Musculoskeletal: She exhibits tenderness (left hip laterally, mild pain w/ ROM). She exhibits no edema.    Assessment & Plan:  See encounters tab for problem based medical decision making. Patient discussed with Dr. Rogelia BogaButcher  Posterior pain of left hip Patient reports she has continued left hip pain. She reports to the pain as being posterior hip and describes nonradiating aching pain. She reports that occasionally she does have sharp pain which radiates down her leg but this is not normal. She has tight muscles bilaterally and has some difficulty with range of motion. Pain does not radiate to her groin she has intact sensation bilaterally in the lower extremity. Patient does have right hemiparesis and may have some compensatory overuse of left lower extremity as result. She's been trying Voltaren gel for this without significant relief. Patient denies any significant traumatic history but does have osteoporosis currently on Prolia.  We will reassess her hip and low back with plain films today in order to rule out fragility fracture given persistence of pain that was unresponsive to physical therapy and nonsteroidal therapy. However feel that her pain  may also be related to underlying chronic constipation which has worsened recently. Please see below for further details.  Constipation Patient is a history of constipation reports that she does not have regular bowel movements. Last, was Saturday. At this time she will required straining to pass her stool which was large and hard. She denies any blood in the stool or on the toilet tissue. No dark  tarry stools. She reports that she just started taking MiraLAX yesterday, but has not had any bowel movement since. I recommend she increase her bowel regimen with MiraLAX 3 times a day and then de-escalate after bowel movement. I will follow up with her several days by phone in order to reassess whether we need to advance with bowel stimulant or enema at that time.  Given her vague abdominal pain may be contributing to this left-sided hip pain she is complaining about and would like to reassess hip pain after better control of constipation is achieved. If her symptoms resolve with bowel regimen would recommend she continue her regular schedule follow-up in May with her PCP.   Signed: Carolynn Comment, MD 05/20/2016, 11:35 AM  Pager: [phone]

## 2016-05-22 NOTE — Progress Notes (Signed)
Internal Medicine Clinic Attending  Case discussed with Dr. Strelow at the time of the visit.  We reviewed the resident's history and exam and pertinent patient test results.  I agree with the assessment, diagnosis, and plan of care documented in the resident's note.  

## 2016-05-28 ENCOUNTER — Other Ambulatory Visit: Payer: Self-pay

## 2016-05-28 MED ORDER — BENZONATATE 200 MG PO CAPS
200.0000 mg | ORAL_CAPSULE | Freq: Three times a day (TID) | ORAL | 0 refills | Status: DC | PRN
Start: 2016-05-28 — End: 2017-04-21

## 2016-05-28 NOTE — Telephone Encounter (Signed)
benzonatate (TESSALON) 200 MG capsule,  Refill request.

## 2016-06-09 ENCOUNTER — Ambulatory Visit: Payer: Medicare Other | Admitting: Sports Medicine

## 2016-06-15 ENCOUNTER — Other Ambulatory Visit: Payer: Self-pay | Admitting: Internal Medicine

## 2016-06-23 ENCOUNTER — Other Ambulatory Visit: Payer: Self-pay | Admitting: Internal Medicine

## 2016-07-04 ENCOUNTER — Other Ambulatory Visit: Payer: Self-pay | Admitting: Internal Medicine

## 2016-07-11 ENCOUNTER — Other Ambulatory Visit: Payer: Self-pay | Admitting: Internal Medicine

## 2016-07-11 DIAGNOSIS — I471 Supraventricular tachycardia: Secondary | ICD-10-CM

## 2016-07-11 DIAGNOSIS — I10 Essential (primary) hypertension: Secondary | ICD-10-CM

## 2016-07-16 ENCOUNTER — Other Ambulatory Visit: Payer: Self-pay | Admitting: Internal Medicine

## 2016-07-21 ENCOUNTER — Encounter: Payer: Self-pay | Admitting: Internal Medicine

## 2016-07-21 ENCOUNTER — Ambulatory Visit: Payer: Medicare Other

## 2016-08-06 ENCOUNTER — Other Ambulatory Visit: Payer: Self-pay | Admitting: Internal Medicine

## 2016-08-07 DIAGNOSIS — H25813 Combined forms of age-related cataract, bilateral: Secondary | ICD-10-CM | POA: Diagnosis not present

## 2016-08-25 ENCOUNTER — Other Ambulatory Visit: Payer: Self-pay | Admitting: Internal Medicine

## 2016-08-25 DIAGNOSIS — Z1231 Encounter for screening mammogram for malignant neoplasm of breast: Secondary | ICD-10-CM

## 2016-08-27 ENCOUNTER — Other Ambulatory Visit: Payer: Self-pay | Admitting: Internal Medicine

## 2016-09-17 ENCOUNTER — Ambulatory Visit: Payer: Medicare Other

## 2016-09-19 ENCOUNTER — Other Ambulatory Visit: Payer: Self-pay | Admitting: Internal Medicine

## 2016-09-28 ENCOUNTER — Other Ambulatory Visit: Payer: Self-pay | Admitting: Internal Medicine

## 2016-09-28 DIAGNOSIS — I639 Cerebral infarction, unspecified: Secondary | ICD-10-CM

## 2016-10-01 ENCOUNTER — Other Ambulatory Visit: Payer: Self-pay | Admitting: Student in an Organized Health Care Education/Training Program

## 2016-10-21 ENCOUNTER — Other Ambulatory Visit: Payer: Self-pay | Admitting: Internal Medicine

## 2016-10-26 ENCOUNTER — Other Ambulatory Visit: Payer: Self-pay | Admitting: Internal Medicine

## 2016-10-26 DIAGNOSIS — I471 Supraventricular tachycardia: Secondary | ICD-10-CM

## 2016-10-26 DIAGNOSIS — I10 Essential (primary) hypertension: Secondary | ICD-10-CM

## 2016-10-28 ENCOUNTER — Ambulatory Visit (HOSPITAL_COMMUNITY)
Admission: RE | Admit: 2016-10-28 | Discharge: 2016-10-28 | Disposition: A | Discharge status: Home / Self Care | Payer: Medicare Other | Source: Ambulatory Visit | Attending: Internal Medicine | Admitting: Internal Medicine

## 2016-10-28 ENCOUNTER — Ambulatory Visit (INDEPENDENT_AMBULATORY_CARE_PROVIDER_SITE_OTHER): Payer: Medicare Other | Admitting: Internal Medicine

## 2016-10-28 ENCOUNTER — Encounter: Payer: Self-pay | Admitting: Internal Medicine

## 2016-10-28 VITALS — BP 126/80 | HR 93 | Temp 98.0°F | Ht 69.0 in

## 2016-10-28 DIAGNOSIS — I69351 Hemiplegia and hemiparesis following cerebral infarction affecting right dominant side: Secondary | ICD-10-CM

## 2016-10-28 DIAGNOSIS — I69320 Aphasia following cerebral infarction: Secondary | ICD-10-CM | POA: Diagnosis not present

## 2016-10-28 DIAGNOSIS — I10 Essential (primary) hypertension: Secondary | ICD-10-CM | POA: Diagnosis not present

## 2016-10-28 DIAGNOSIS — I872 Venous insufficiency (chronic) (peripheral): Secondary | ICD-10-CM | POA: Diagnosis not present

## 2016-10-28 DIAGNOSIS — Z79899 Other long term (current) drug therapy: Secondary | ICD-10-CM

## 2016-10-28 DIAGNOSIS — Z87891 Personal history of nicotine dependence: Secondary | ICD-10-CM | POA: Diagnosis not present

## 2016-10-28 DIAGNOSIS — M329 Systemic lupus erythematosus, unspecified: Secondary | ICD-10-CM | POA: Diagnosis not present

## 2016-10-28 DIAGNOSIS — R600 Localized edema: Secondary | ICD-10-CM

## 2016-10-28 NOTE — Progress Notes (Addendum)
   CC: Right foot swelling and rash  HPI:  [name] is a 61 y.o. with hypertension, history of stroke (with right sided weakness and productive aphasia), SLE, and chronic bilateral lower extremity edema who presents for rash of her right foot.  Collateral from son due to aphasia. She is able to answer yes and no questions, but otherwise have troubles interpreting her speech.  Past Medical History:  Diagnosis Date  . ANEMIA-IRON DEFICIENCY 03/02/2006   H&H 8.5/25.9 5/09. On 5/13 12.8/39.7 with MCV 80.4.     Marland Kitchen. 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 Depression   . GERD (gastroesophageal reflux disease)   . Hypertension   . Osteopenia   . 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 SLE (systemic lupus erythematosus) (HCC)   . Stroke (HCC) 1991  . SVT (supraventricular tachycardia) (HCC)    a. 03/2013 with mildly elevated troponin at that time.  . Venous insufficiency (chronic) (peripheral)     Review of Systems:   Review of Systems  Constitutional: Positive for chills. Negative for fever.  Respiratory: Negative for hemoptysis and shortness of breath.   Cardiovascular: Positive for leg swelling. Negative for chest pain.  Skin: Positive for rash.  Neurological: Positive for focal weakness. Negative for sensory change.    Physical Exam:  Vitals:   10/28/16 0912  BP: 126/80  Pulse: 93  Temp: 98 F (36.7 C)  TempSrc: Oral  SpO2: 100%  Height: 5\' 9"  (1.753 m)   Physical Exam  Constitutional: She appears well-developed and well-nourished. No distress.  Cardiovascular: Normal rate and regular rhythm.   Unable to palpate pedal pulses secondary to edema  Pulmonary/Chest: Effort normal and breath sounds normal.  Musculoskeletal:  Right greater than left lower extremity pitting edema Diameter of right calf about twice that of left calf No posterior calf tenderness or palpable  cord  Neurological:  Alert, incomprehensible speech Right hemiparalysis  Skin:  Dorsum of right foot with skin breakdown, ulceration, serosanguineous seepage, surrounded by hyperpigmentation and dry scaling consistent with chronic venous stasis. Left foot with hyperpigmentation consistent with chronic venous stasis Feet warm       Assessment & Plan:   See Encounters Tab for problem based charting.   ADDENDUM 10/28/2016 PM After her stat DVT ultrasound was completed and negative, she returned to clinic for bandaging of her right foot I just cellular foam occlusive bandage. At that time she mentioned rash of her right arm as well as worsening muscle spasms. She was instructed to make an appointment to return to clinic next week to address these problems.  Patient discussed with Dr. Heide SparkNarendra

## 2016-10-28 NOTE — Assessment & Plan Note (Signed)
Over the last several weeks she has had insidious worsening of her right lower extremity swelling and skin breakdown. She has painful rash and dryness foot, which has started losing recently. She and her son up in trying to keep it covered with gauze and socks, but it is been sticking to the material and is painful when removed.  She denies fevers, but has occasional chills. Has been eating and drinking normally. She uses a wheelchair for mobility, and has not had any wounds or injuries to the foot to her knowledge.  A/P Severe venous stasis dermatitis with skin breakdown. I doubt superimposed bacterial infection, with no systemic signs, erythema, odor, or purulence.  I think this is most likely due to venous insufficiency compounded by her hemiparalysis, but she also has risk factors for peripheral arterial disease and I was unable to palpate pedal pulses due to edema  -Wound care referral -ABIs

## 2016-10-28 NOTE — Patient Instructions (Signed)
I think the skin on your foot is breaking down because of swelling. I think this is most likely because of paralysis in that leg and the veins not working well because he can't move it. However it is also possible that a blood clot in that leg could cause the swelling to get worse.  I would like to get an ultrasound of that leg to make sure there is not a blood clot, and ask home health nurse to come and help you care for the wound on the foot.

## 2016-10-28 NOTE — Progress Notes (Signed)
*  Preliminary Results* Right lower extremity venous duplex completed. Visualized veins of the right lower extremity are negative for deep vein thrombosis. There is no evidence of right Baker's cyst.  10/28/2016 12:01 PM  Gertie FeyMichelle Melton Walls, BS, RVT, RDCS, RDMS

## 2016-10-28 NOTE — Assessment & Plan Note (Signed)
Chronic bilateral lower extremity edema right worse than left. She had a DVT ultrasound of her right leg about a year and a half ago which was negative for clot. Her son says that right now the asymmetry is actually a little bit better than at baseline.  A/P Lower extremity edema most likely due to venous insufficiency and restricted mobility, but she has risk factors for DVT including mobility and connective tissue disorder. -DVT ultrasound right leg

## 2016-11-02 ENCOUNTER — Encounter (HOSPITAL_BASED_OUTPATIENT_CLINIC_OR_DEPARTMENT_OTHER): Payer: Medicare Other | Attending: Internal Medicine

## 2016-11-02 DIAGNOSIS — L97819 Non-pressure chronic ulcer of other part of right lower leg with unspecified severity: Secondary | ICD-10-CM | POA: Diagnosis not present

## 2016-11-02 DIAGNOSIS — I87331 Chronic venous hypertension (idiopathic) with ulcer and inflammation of right lower extremity: Secondary | ICD-10-CM | POA: Insufficient documentation

## 2016-11-02 DIAGNOSIS — I69351 Hemiplegia and hemiparesis following cerebral infarction affecting right dominant side: Secondary | ICD-10-CM | POA: Insufficient documentation

## 2016-11-02 DIAGNOSIS — I10 Essential (primary) hypertension: Secondary | ICD-10-CM | POA: Insufficient documentation

## 2016-11-02 DIAGNOSIS — S91301A Unspecified open wound, right foot, initial encounter: Secondary | ICD-10-CM | POA: Diagnosis not present

## 2016-11-02 DIAGNOSIS — L97511 Non-pressure chronic ulcer of other part of right foot limited to breakdown of skin: Secondary | ICD-10-CM | POA: Diagnosis not present

## 2016-11-05 ENCOUNTER — Encounter: Payer: Self-pay | Admitting: Internal Medicine

## 2016-11-05 ENCOUNTER — Ambulatory Visit (INDEPENDENT_AMBULATORY_CARE_PROVIDER_SITE_OTHER): Payer: Medicare Other | Admitting: Internal Medicine

## 2016-11-05 VITALS — BP 138/78 | HR 87 | Temp 97.9°F

## 2016-11-05 DIAGNOSIS — I69351 Hemiplegia and hemiparesis following cerebral infarction affecting right dominant side: Secondary | ICD-10-CM | POA: Diagnosis not present

## 2016-11-05 DIAGNOSIS — I10 Essential (primary) hypertension: Secondary | ICD-10-CM

## 2016-11-05 DIAGNOSIS — I69320 Aphasia following cerebral infarction: Secondary | ICD-10-CM

## 2016-11-05 DIAGNOSIS — G8191 Hemiplegia, unspecified affecting right dominant side: Secondary | ICD-10-CM

## 2016-11-05 DIAGNOSIS — E669 Obesity, unspecified: Secondary | ICD-10-CM

## 2016-11-05 DIAGNOSIS — M329 Systemic lupus erythematosus, unspecified: Secondary | ICD-10-CM | POA: Diagnosis not present

## 2016-11-05 DIAGNOSIS — M6249 Contracture of muscle, multiple sites: Secondary | ICD-10-CM | POA: Diagnosis not present

## 2016-11-05 DIAGNOSIS — R600 Localized edema: Secondary | ICD-10-CM | POA: Diagnosis not present

## 2016-11-05 DIAGNOSIS — R21 Rash and other nonspecific skin eruption: Secondary | ICD-10-CM | POA: Diagnosis not present

## 2016-11-05 DIAGNOSIS — Z87891 Personal history of nicotine dependence: Secondary | ICD-10-CM

## 2016-11-05 MED ORDER — TRIAMCINOLONE ACETONIDE 0.1 % EX CREA: 1.0000 "application " | TOPICAL_CREAM | Freq: Two times a day (BID) | CUTANEOUS | 1 refills | Status: DC

## 2016-11-05 MED ORDER — BACLOFEN 10 MG PO TABS: 5.0000 mg | ORAL_TABLET | Freq: Three times a day (TID) | ORAL | 1 refills | Status: DC

## 2016-11-05 MED ORDER — TRIAMCINOLONE ACETONIDE 0.1 % EX CREA
TOPICAL_CREAM | Freq: Two times a day (BID) | CUTANEOUS | Status: DC
Start: 2016-11-05 — End: 2016-11-05

## 2016-11-05 NOTE — Assessment & Plan Note (Addendum)
[name] has worsening of her chronic intermittent painful muscle spasms.  They most affect her right thigh, with some unpredictable onset of intense pain which lasts for several seconds before gradually subsiding.  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 -Discontinue cyclobenzaprine -Start baclofen 5 mg 3 times a day for spasticity -Continue gabapentin venlafaxine -Refer to PM&R, may benefit from further management of antispasmodics and potentially botulinum toxin injections

## 2016-11-05 NOTE — Progress Notes (Signed)
   CC: painful muscle cramps and rash  HPI:  [name] is a 61 y.o. with hypertension, history of stroke (with right hemiparalysis and productive aphasia), SLE, and chronic bilateral lower extremity edema who presents for painful muscle spasms.  Collateral provided by son and aide due to aphasia.  She is able to answer yes or no questions, but my ability to understand her is limited.   Past Medical History:  Diagnosis Date  . ANEMIA-IRON DEFICIENCY 03/02/2006   H&H 8.5/25.9 5/09. On 5/13 12.8/39.7 with MCV 80.4.     Marland Kitchen. 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 Depression   . GERD (gastroesophageal reflux disease)   . Hypertension   . Osteopenia   . 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 SLE (systemic lupus erythematosus) (HCC)   . Stroke (HCC) 1991  . SVT (supraventricular tachycardia) (HCC)    a. 03/2013 with mildly elevated troponin at that time.  . Venous insufficiency (chronic) (peripheral)     Review of Systems:   Review of Systems  Constitutional: Negative for chills and fever.  Musculoskeletal: Positive for joint pain and myalgias.  Skin: Positive for itching and rash.    Physical Exam:  Vitals:   11/05/16 1122  BP: 138/78  Pulse: 87  Temp: 97.9 F (36.6 C)  TempSrc: Oral  SpO2: 94%     Physical Exam  Constitutional:  Obese woman with contracted right arm sitting in wheelchair in no distress  Cardiovascular: Normal rate and regular rhythm.   Pulmonary/Chest: Effort normal and breath sounds normal.  Neurological: She is alert.  Productive aphasia Right hemiparalysis Right arm contracted at elbow and wrist Increased tone in right upper and lower extremity  Skin:  Diffuse erythematous maculopapular rash with signs of excoriation on the right side and some flaking skin. The rash is worst on her forearms and abdomen, a few scattered erythematous papules are  present on her legs and back     Assessment & Plan:   See Encounters Tab for problem based charting.  Patient seen with Dr. Oswaldo DoneVincent

## 2016-11-05 NOTE — Assessment & Plan Note (Signed)
For the last 1-2 weeks he has had a worsening pruritic rash which started on her right arm and is now spread to involve much of her body. It is worst on her forearms and abdomen. She has tried over-the-counter 1% hydrocortisone cream and some other kind of lotion for the past week or so, but the rash has gotten worse.  She started using an new perfume daily about 2 or 3 weeks ago. Her aide sprays to perfume all over her body.  No other new soaps or products.  She has history of rashes, but this is the worst it is ever been. She has previously had a rash biopsied which showed spongiotic dermatitis.  A/P Diffuse pruritic erythematous maculopapular rash worst on exposed skin surfaces was onset correlates with use of a new product is most consistent with allergic contact dermatitis.  -Moderate intensive topical steroid with 0.1% triamcinolone -Discontinue perfusing the use -Wash with mild soap and water -Return to clinic if no improvement in one week

## 2016-11-05 NOTE — Progress Notes (Signed)
Internal Medicine Clinic Attending  I saw and evaluated the patient.  I personally confirmed the key portions of the history and exam documented by Dr. O'Sullivan and I reviewed pertinent patient test results.  The assessment, diagnosis, and plan were formulated together and I agree with the documentation in the resident's note.   

## 2016-11-05 NOTE — Patient Instructions (Addendum)
For your painful muscle spasms, please stop taking Flexeril and start taking Baclofen 3 times per day. I will also refer you to Physical Medicine and Rehabilitation for further treatment of your muscle spasms.  I think your rash is most likely from the new perfume. Please stop using all perfumes until the rash clears up. Wash with mild soap and water. I have prescribed you steroid cream to apply to the rash twice daily to try and help it heal up.  If the rash isn't starting to get better in the next week, please come back for another appointment.

## 2016-11-05 NOTE — Addendum Note (Signed)
Addended by: Erlinda HongVINCENT, Feras Gardella T on: 11/05/2016 03:06 PM   Modules accepted: Level of Service

## 2016-11-06 DIAGNOSIS — F329 Major depressive disorder, single episode, unspecified: Secondary | ICD-10-CM | POA: Diagnosis not present

## 2016-11-06 DIAGNOSIS — M810 Age-related osteoporosis without current pathological fracture: Secondary | ICD-10-CM | POA: Diagnosis not present

## 2016-11-06 DIAGNOSIS — Z86718 Personal history of other venous thrombosis and embolism: Secondary | ICD-10-CM | POA: Diagnosis not present

## 2016-11-06 DIAGNOSIS — M329 Systemic lupus erythematosus, unspecified: Secondary | ICD-10-CM | POA: Diagnosis not present

## 2016-11-06 DIAGNOSIS — F482 Pseudobulbar affect: Secondary | ICD-10-CM | POA: Diagnosis not present

## 2016-11-06 DIAGNOSIS — I69820 Aphasia following other cerebrovascular disease: Secondary | ICD-10-CM | POA: Diagnosis not present

## 2016-11-06 DIAGNOSIS — G40909 Epilepsy, unspecified, not intractable, without status epilepticus: Secondary | ICD-10-CM | POA: Diagnosis not present

## 2016-11-06 DIAGNOSIS — J45909 Unspecified asthma, uncomplicated: Secondary | ICD-10-CM | POA: Diagnosis not present

## 2016-11-06 DIAGNOSIS — Z7982 Long term (current) use of aspirin: Secondary | ICD-10-CM | POA: Diagnosis not present

## 2016-11-06 DIAGNOSIS — I119 Hypertensive heart disease without heart failure: Secondary | ICD-10-CM | POA: Diagnosis not present

## 2016-11-06 DIAGNOSIS — Z4800 Encounter for change or removal of nonsurgical wound dressing: Secondary | ICD-10-CM | POA: Diagnosis not present

## 2016-11-06 DIAGNOSIS — L97511 Non-pressure chronic ulcer of other part of right foot limited to breakdown of skin: Secondary | ICD-10-CM | POA: Diagnosis not present

## 2016-11-06 DIAGNOSIS — I69851 Hemiplegia and hemiparesis following other cerebrovascular disease affecting right dominant side: Secondary | ICD-10-CM | POA: Diagnosis not present

## 2016-11-06 DIAGNOSIS — Z87891 Personal history of nicotine dependence: Secondary | ICD-10-CM | POA: Diagnosis not present

## 2016-11-06 DIAGNOSIS — I87331 Chronic venous hypertension (idiopathic) with ulcer and inflammation of right lower extremity: Secondary | ICD-10-CM | POA: Diagnosis not present

## 2016-11-06 DIAGNOSIS — Z79899 Other long term (current) drug therapy: Secondary | ICD-10-CM | POA: Diagnosis not present

## 2016-11-06 DIAGNOSIS — I739 Peripheral vascular disease, unspecified: Secondary | ICD-10-CM | POA: Diagnosis not present

## 2016-11-06 DIAGNOSIS — I69898 Other sequelae of other cerebrovascular disease: Secondary | ICD-10-CM | POA: Diagnosis not present

## 2016-11-09 DIAGNOSIS — S81801A Unspecified open wound, right lower leg, initial encounter: Secondary | ICD-10-CM | POA: Diagnosis not present

## 2016-11-09 DIAGNOSIS — L97511 Non-pressure chronic ulcer of other part of right foot limited to breakdown of skin: Secondary | ICD-10-CM | POA: Diagnosis not present

## 2016-11-09 DIAGNOSIS — S91301A Unspecified open wound, right foot, initial encounter: Secondary | ICD-10-CM | POA: Diagnosis not present

## 2016-11-09 DIAGNOSIS — I87331 Chronic venous hypertension (idiopathic) with ulcer and inflammation of right lower extremity: Secondary | ICD-10-CM | POA: Diagnosis not present

## 2016-11-09 DIAGNOSIS — I872 Venous insufficiency (chronic) (peripheral): Secondary | ICD-10-CM | POA: Diagnosis not present

## 2016-11-09 DIAGNOSIS — I69351 Hemiplegia and hemiparesis following cerebral infarction affecting right dominant side: Secondary | ICD-10-CM | POA: Diagnosis not present

## 2016-11-09 DIAGNOSIS — L97819 Non-pressure chronic ulcer of other part of right lower leg with unspecified severity: Secondary | ICD-10-CM | POA: Diagnosis not present

## 2016-11-09 DIAGNOSIS — I10 Essential (primary) hypertension: Secondary | ICD-10-CM | POA: Diagnosis not present

## 2016-11-09 NOTE — Progress Notes (Signed)
Internal Medicine Clinic Attending  Case discussed with Dr. O'Sullivan at the time of the visit.  We reviewed the resident's history and exam and pertinent patient test results.  I agree with the assessment, diagnosis, and plan of care documented in the resident's note. 

## 2016-11-11 ENCOUNTER — Other Ambulatory Visit: Payer: Self-pay | Admitting: Internal Medicine

## 2016-11-11 DIAGNOSIS — G40909 Epilepsy, unspecified, not intractable, without status epilepticus: Secondary | ICD-10-CM

## 2016-11-12 ENCOUNTER — Ambulatory Visit (HOSPITAL_COMMUNITY)
Admission: RE | Admit: 2016-11-12 | Discharge: 2016-11-12 | Disposition: A | Discharge status: Home / Self Care | Payer: Medicare Other | Source: Ambulatory Visit | Attending: Internal Medicine | Admitting: Internal Medicine

## 2016-11-12 DIAGNOSIS — M329 Systemic lupus erythematosus, unspecified: Secondary | ICD-10-CM | POA: Diagnosis not present

## 2016-11-12 DIAGNOSIS — I119 Hypertensive heart disease without heart failure: Secondary | ICD-10-CM | POA: Diagnosis not present

## 2016-11-12 DIAGNOSIS — F329 Major depressive disorder, single episode, unspecified: Secondary | ICD-10-CM | POA: Diagnosis not present

## 2016-11-12 DIAGNOSIS — I872 Venous insufficiency (chronic) (peripheral): Secondary | ICD-10-CM | POA: Diagnosis not present

## 2016-11-12 DIAGNOSIS — I87331 Chronic venous hypertension (idiopathic) with ulcer and inflammation of right lower extremity: Secondary | ICD-10-CM | POA: Diagnosis not present

## 2016-11-12 DIAGNOSIS — I739 Peripheral vascular disease, unspecified: Secondary | ICD-10-CM | POA: Diagnosis not present

## 2016-11-12 DIAGNOSIS — L97511 Non-pressure chronic ulcer of other part of right foot limited to breakdown of skin: Secondary | ICD-10-CM | POA: Diagnosis not present

## 2016-11-12 NOTE — Progress Notes (Signed)
VASCULAR LAB PRELIMINARY  ARTERIAL  ABI completed:    RIGHT    LEFT    PRESSURE WAVEFORM  PRESSURE WAVEFORM  BRACHIAL Contracted extremity Unable to obtain BRACHIAL 163 Triphasic  DP 151 Biphasic DP 158 Triphasic  AT 173 Triphasic 168 168 Triphasic  PT 174 Triphasic PT 150 Triphasic    RIGHT LEFT  ABI 1.07 1.03   ABIs and Doppler waveforms indicate normal arerial flow at rest bilaterally   Ismeal Heider, RVS 11/12/2016, 1:33 PM

## 2016-11-14 ENCOUNTER — Other Ambulatory Visit: Payer: Self-pay | Admitting: Internal Medicine

## 2016-11-16 ENCOUNTER — Encounter (HOSPITAL_BASED_OUTPATIENT_CLINIC_OR_DEPARTMENT_OTHER): Payer: Medicare Other | Attending: Internal Medicine

## 2016-11-16 DIAGNOSIS — I10 Essential (primary) hypertension: Secondary | ICD-10-CM | POA: Insufficient documentation

## 2016-11-16 DIAGNOSIS — Z09 Encounter for follow-up examination after completed treatment for conditions other than malignant neoplasm: Secondary | ICD-10-CM | POA: Insufficient documentation

## 2016-11-16 DIAGNOSIS — Z872 Personal history of diseases of the skin and subcutaneous tissue: Secondary | ICD-10-CM | POA: Insufficient documentation

## 2016-11-16 DIAGNOSIS — I739 Peripheral vascular disease, unspecified: Secondary | ICD-10-CM | POA: Insufficient documentation

## 2016-11-16 DIAGNOSIS — L930 Discoid lupus erythematosus: Secondary | ICD-10-CM | POA: Insufficient documentation

## 2016-11-16 DIAGNOSIS — L309 Dermatitis, unspecified: Secondary | ICD-10-CM | POA: Insufficient documentation

## 2016-11-19 ENCOUNTER — Encounter: Payer: Self-pay | Admitting: Internal Medicine

## 2016-11-19 ENCOUNTER — Ambulatory Visit (INDEPENDENT_AMBULATORY_CARE_PROVIDER_SITE_OTHER): Payer: Medicare Other | Admitting: Internal Medicine

## 2016-11-19 VITALS — BP 128/77 | HR 87 | Temp 97.6°F | Wt 215.1 lb

## 2016-11-19 DIAGNOSIS — Z7982 Long term (current) use of aspirin: Secondary | ICD-10-CM | POA: Diagnosis not present

## 2016-11-19 DIAGNOSIS — Z79899 Other long term (current) drug therapy: Secondary | ICD-10-CM | POA: Diagnosis not present

## 2016-11-19 DIAGNOSIS — Z87891 Personal history of nicotine dependence: Secondary | ICD-10-CM

## 2016-11-19 DIAGNOSIS — I872 Venous insufficiency (chronic) (peripheral): Secondary | ICD-10-CM | POA: Diagnosis not present

## 2016-11-19 DIAGNOSIS — I87331 Chronic venous hypertension (idiopathic) with ulcer and inflammation of right lower extremity: Secondary | ICD-10-CM | POA: Diagnosis not present

## 2016-11-19 DIAGNOSIS — I10 Essential (primary) hypertension: Secondary | ICD-10-CM | POA: Diagnosis present

## 2016-11-19 DIAGNOSIS — F329 Major depressive disorder, single episode, unspecified: Secondary | ICD-10-CM | POA: Diagnosis not present

## 2016-11-19 DIAGNOSIS — Z0000 Encounter for general adult medical examination without abnormal findings: Secondary | ICD-10-CM

## 2016-11-19 DIAGNOSIS — M329 Systemic lupus erythematosus, unspecified: Secondary | ICD-10-CM | POA: Diagnosis not present

## 2016-11-19 DIAGNOSIS — I739 Peripheral vascular disease, unspecified: Secondary | ICD-10-CM | POA: Diagnosis not present

## 2016-11-19 DIAGNOSIS — I119 Hypertensive heart disease without heart failure: Secondary | ICD-10-CM | POA: Diagnosis not present

## 2016-11-19 DIAGNOSIS — L97511 Non-pressure chronic ulcer of other part of right foot limited to breakdown of skin: Secondary | ICD-10-CM | POA: Diagnosis not present

## 2016-11-19 LAB — BASIC METABOLIC PANEL
ANION GAP: 10 (ref 5–15)
BUN: 15 mg/dL (ref 6–20)
CALCIUM: 9.7 mg/dL (ref 8.9–10.3)
CO2: 31 mmol/L (ref 22–32)
CREATININE: 0.6 mg/dL (ref 0.44–1.00)
Chloride: 96 mmol/L — ABNORMAL LOW (ref 101–111)
GFR calc Af Amer: >60 mL/min (ref >60)
GLUCOSE: 137 mg/dL — AB (ref 65–99)
Potassium: 3.8 mmol/L (ref 3.5–5.1)
Sodium: 137 mmol/L (ref 135–145)

## 2016-11-19 LAB — CBC
HCT: 41.8 % (ref 36.0–46.0)
Hemoglobin: 12.9 g/dL (ref 12.0–15.0)
MCH: 26.4 pg (ref 26.0–34.0)
MCHC: 30.9 g/dL (ref 30.0–36.0)
MCV: 85.5 fL (ref 78.0–100.0)
PLATELETS: 442 10*3/uL — AB (ref 150–400)
RBC: 4.89 MIL/uL (ref 3.87–5.11)
RDW: 15.1 % (ref 11.5–15.5)
WBC: 7.2 10*3/uL (ref 4.0–10.5)

## 2016-11-19 LAB — FERRITIN: FERRITIN: 41 ng/mL (ref 11–307)

## 2016-11-19 MED ORDER — TRAMADOL HCL 50 MG PO TABS: 50.0000 mg | ORAL_TABLET | Freq: Four times a day (QID) | ORAL | 0 refills | Status: DC | PRN

## 2016-11-19 NOTE — Patient Instructions (Signed)
[name],  It is a pleasure seeing you today. Please take tramadol as needed for your leg pain. Please follow up with your wound care clinic on Monday. Please follow up in the acute care clinic for any acute medical needs. Please follow up with me in 6 months.

## 2016-11-20 ENCOUNTER — Other Ambulatory Visit: Payer: Self-pay | Admitting: Internal Medicine

## 2016-11-20 NOTE — Telephone Encounter (Signed)
[facility]cyclobenzaprine was d/c at that time.  Will send request to pt's pcp for review. Per pharmacy they filled medication today (11/20/2016) and was sending request for additional refills.  Please advise.Criss AlvineGoldston, Darlene Cassady7/6/201810:43 AM   Attempted to contact patient regarding request-no answer.  Message left on recorder.Kingsley SpittleGoldston, Darlene Cassady7/6/201810:47 AM

## 2016-11-20 NOTE — Telephone Encounter (Signed)
Received return call from pt's son Joseph PieriniJason Kuhnert and he confirmed that pt is NOT taking cyclobenzaprine.  Will refuse request and send to MD for cosign.Kingsley SpittleGoldston, Pierina Schuknecht Cassady7/6/201811:19 AM

## 2016-11-23 ENCOUNTER — Other Ambulatory Visit: Payer: Self-pay | Admitting: Internal Medicine

## 2016-11-23 DIAGNOSIS — I739 Peripheral vascular disease, unspecified: Secondary | ICD-10-CM | POA: Diagnosis not present

## 2016-11-23 DIAGNOSIS — L309 Dermatitis, unspecified: Secondary | ICD-10-CM | POA: Diagnosis not present

## 2016-11-23 DIAGNOSIS — L308 Other specified dermatitis: Secondary | ICD-10-CM | POA: Diagnosis not present

## 2016-11-23 DIAGNOSIS — L930 Discoid lupus erythematosus: Secondary | ICD-10-CM | POA: Diagnosis not present

## 2016-11-23 DIAGNOSIS — Z872 Personal history of diseases of the skin and subcutaneous tissue: Secondary | ICD-10-CM | POA: Diagnosis not present

## 2016-11-23 DIAGNOSIS — S91301D Unspecified open wound, right foot, subsequent encounter: Secondary | ICD-10-CM | POA: Diagnosis not present

## 2016-11-23 DIAGNOSIS — I10 Essential (primary) hypertension: Secondary | ICD-10-CM | POA: Diagnosis not present

## 2016-11-23 DIAGNOSIS — Z09 Encounter for follow-up examination after completed treatment for conditions other than malignant neoplasm: Secondary | ICD-10-CM | POA: Diagnosis not present

## 2016-11-24 ENCOUNTER — Other Ambulatory Visit (INDEPENDENT_AMBULATORY_CARE_PROVIDER_SITE_OTHER): Payer: Medicare Other

## 2016-11-24 DIAGNOSIS — D509 Iron deficiency anemia, unspecified: Secondary | ICD-10-CM | POA: Diagnosis not present

## 2016-11-24 LAB — IFOBT (OCCULT BLOOD): IMMUNOLOGICAL FECAL OCCULT BLOOD TEST: NEGATIVE

## 2016-11-25 NOTE — Progress Notes (Signed)
   CC: Follow up on Essential hypertension  HPI:  [name] is a 61 y.o. female with history noted below that presents to the Internal medicine clinic for follow up of essential hypertension.  Please see problem based charting for the status of patient's chronic medical conditions.    Past Medical History:  Diagnosis Date  . ANEMIA-IRON DEFICIENCY 03/02/2006   H&H 8.5/25.9 5/09. On 5/13 12.8/39.7 with MCV 80.4.     Marland Kitchen. 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 Depression   . GERD (gastroesophageal reflux disease)   . Hypertension   . Osteopenia   . 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 SLE (systemic lupus erythematosus) (HCC)   . Stroke (HCC) 1991  . SVT (supraventricular tachycardia) (HCC)    a. 03/2013 with mildly elevated troponin at that time.  . Venous insufficiency (chronic) (peripheral)     Review of Systems:  Review of Systems  Respiratory: Negative for shortness of breath.   Cardiovascular: Positive for leg swelling. Negative for chest pain.     Physical Exam:  Vitals:   11/19/16 1433  BP: 128/77  Pulse: 87  Temp: 97.6 F (36.4 C)  TempSrc: Oral  SpO2: 97%  Weight: 215 lb 1.6 oz (97.6 kg)   Physical Exam  Constitutional: She is well-developed, well-nourished, and in no distress.  Cardiovascular: Normal rate, regular rhythm and normal heart sounds.  Exam reveals no gallop and no friction rub.   No murmur heard. Pulmonary/Chest: Effort normal and breath sounds normal. No respiratory distress. She has no wheezes. She has no rales.  Skin:  Severe venous stasis dermatitis of right leg and dry scaling    Assessment & Plan:   See encounters tab for problem based medical decision making.   Patient discussed with Dr. Heide SparkNarendra

## 2016-11-25 NOTE — Assessment & Plan Note (Addendum)
Assessment:  Venous stasis dermatitis of right lower extremity  [facility] for worsening of her right lower extremity swelling and skin breakdown.  Since then she has been seen by wound care and had ABIs which showed right: 1.07 and left 1.03.  Son was present and states that the appearance of her right lower extremity today has improved with wound care and next appointment is 7/9.  Patient states she continues to have severe pain in her right leg.  Plan - encouraged follow up with wound care - short course of tramadol #20 [id number]

## 2016-11-25 NOTE — Assessment & Plan Note (Signed)
Assessment:  Essential hypertension  Patient reports compliance with lisinopril 10mg  daily and metoprolol 50mg  daily.  Patient's blood pressure in office was 128/77 stable and at goal  Plan Continue lisinopril and metoprolol

## 2016-11-25 NOTE — Assessment & Plan Note (Signed)
Assessment: health care maintenance  Patient is due for colonoscopy.  Offered patient fit test vs colonoscopy.  She decided to have the fit test.  Plan -fit test  Addendum:  Fit test negative

## 2016-11-26 DIAGNOSIS — L97511 Non-pressure chronic ulcer of other part of right foot limited to breakdown of skin: Secondary | ICD-10-CM | POA: Diagnosis not present

## 2016-11-26 DIAGNOSIS — I119 Hypertensive heart disease without heart failure: Secondary | ICD-10-CM | POA: Diagnosis not present

## 2016-11-26 DIAGNOSIS — M329 Systemic lupus erythematosus, unspecified: Secondary | ICD-10-CM | POA: Diagnosis not present

## 2016-11-26 DIAGNOSIS — I739 Peripheral vascular disease, unspecified: Secondary | ICD-10-CM | POA: Diagnosis not present

## 2016-11-26 DIAGNOSIS — I87331 Chronic venous hypertension (idiopathic) with ulcer and inflammation of right lower extremity: Secondary | ICD-10-CM | POA: Diagnosis not present

## 2016-11-26 DIAGNOSIS — F329 Major depressive disorder, single episode, unspecified: Secondary | ICD-10-CM | POA: Diagnosis not present

## 2016-11-26 NOTE — Progress Notes (Signed)
Internal Medicine Clinic Attending  Case discussed with Dr. Hoffman at the time of the visit.  We reviewed the resident's history and exam and pertinent patient test results.  I agree with the assessment, diagnosis, and plan of care documented in the resident's note.  

## 2016-12-01 DIAGNOSIS — M329 Systemic lupus erythematosus, unspecified: Secondary | ICD-10-CM | POA: Diagnosis not present

## 2016-12-01 DIAGNOSIS — I739 Peripheral vascular disease, unspecified: Secondary | ICD-10-CM | POA: Diagnosis not present

## 2016-12-01 DIAGNOSIS — I119 Hypertensive heart disease without heart failure: Secondary | ICD-10-CM | POA: Diagnosis not present

## 2016-12-01 DIAGNOSIS — F329 Major depressive disorder, single episode, unspecified: Secondary | ICD-10-CM | POA: Diagnosis not present

## 2016-12-01 DIAGNOSIS — I87331 Chronic venous hypertension (idiopathic) with ulcer and inflammation of right lower extremity: Secondary | ICD-10-CM | POA: Diagnosis not present

## 2016-12-01 DIAGNOSIS — L97511 Non-pressure chronic ulcer of other part of right foot limited to breakdown of skin: Secondary | ICD-10-CM | POA: Diagnosis not present

## 2016-12-08 ENCOUNTER — Other Ambulatory Visit: Payer: Self-pay | Admitting: Internal Medicine

## 2016-12-10 DIAGNOSIS — I739 Peripheral vascular disease, unspecified: Secondary | ICD-10-CM | POA: Diagnosis not present

## 2016-12-10 DIAGNOSIS — F329 Major depressive disorder, single episode, unspecified: Secondary | ICD-10-CM | POA: Diagnosis not present

## 2016-12-10 DIAGNOSIS — I119 Hypertensive heart disease without heart failure: Secondary | ICD-10-CM | POA: Diagnosis not present

## 2016-12-10 DIAGNOSIS — I87331 Chronic venous hypertension (idiopathic) with ulcer and inflammation of right lower extremity: Secondary | ICD-10-CM | POA: Diagnosis not present

## 2016-12-10 DIAGNOSIS — M329 Systemic lupus erythematosus, unspecified: Secondary | ICD-10-CM | POA: Diagnosis not present

## 2016-12-10 DIAGNOSIS — L97511 Non-pressure chronic ulcer of other part of right foot limited to breakdown of skin: Secondary | ICD-10-CM | POA: Diagnosis not present

## 2016-12-16 ENCOUNTER — Other Ambulatory Visit: Payer: Self-pay | Admitting: Internal Medicine

## 2016-12-16 ENCOUNTER — Encounter: Payer: Self-pay | Admitting: Rehabilitative and Restorative Service Providers"

## 2016-12-16 NOTE — Therapy (Signed)
[facility] [address] [place], [place], [address] Phone: [phone]   Fax:  [phone]  Patient Details  Name: Wanda [name] MRN: [id number] Date of Birth: 01/07/1956 Referring Provider: Jule Ser, DO  Encounter Date: last encounter 04/08/2017  PHYSICAL THERAPY DISCHARGE SUMMARY  Visits from Start of Care: 6  Current functional level related to goals / functional outcomes:     PT Short Term Goals - 03/12/16 1507      PT SHORT TERM GOAL #1   Title STG's = LTG's         PT Long Term Goals - 04/01/16 1504      PT LONG TERM GOAL #1   Title Pt will effectively perform home stretching program with assist of caregiver (son or [facility] aide) with no cueing from this PT to maximize functional gains made in therapy.      Baseline Target date 04/26/2016   Time 4   Period Weeks     PT LONG TERM GOAL #2   Title Caregiver will verbalize understanding of frequency/duration of pressure relief and assist pt in effectively performing pressure relief to preserve skin integrity.   Baseline Target date 04/26/2016   Time 4   Period Weeks     PT LONG TERM GOAL #3   Title Pt will perform supine <> sit with min A, to demo increased independence getting into/out of bed.   Baseline Target date 04/26/2016   Time 4   Period Weeks     PT LONG TERM GOAL #4   Title Pt will perform supine <>sidelying rolling for improved assist with dressing/ADL tasks and positioning in bed with supervision and verbal cues.    Baseline Target date 04/26/2016   Time 4   Period Weeks     PT LONG TERM GOAL #5   Title Pt will perform level transfers from w/c <> mat table with CGA to decrease caregiver burden.     Baseline Target date 04/26/2016   Time 4   Period Weeks     PT LONG TERM GOAL #6   Title Pt will perform static standing with LUE support at countertop x15 seconds with min guard to increase pt/caregiver safety with LB dressing.     Baseline MET on 11/15 with patient standing with min A x 1.5 minutes.  She needs mod A to transition to stand, but can maintain standing with min A.    Time 4   Period Weeks     PT LONG TERM GOAL #7   Title Assess current R AFO for appropriate fit and make recommendations to maximize pt safety with transfers, maintain joint positioning/alignment.     Baseline MET on 11/15.  Current AFO assists with ankle position and improves transfers and patient safety.   Time 4   Period Weeks        Remaining deficits: Patient did not return after visit on 04/08/17--see daily note for patient functional status at time of last session.   Education / Equipment: Home exercise, family training, home safety.  Plan: Patient agrees to discharge.  Patient goals were not met. Patient is being discharged due to not returning since the last visit.  ?????      Thank you for the referral of this patient. Rudell Cobb, MPT   Margy Sumler 12/16/2016, 9:56 AM  [facility] [address] [place], [place], [address] Phone: [phone]   Fax:  [phone]

## 2016-12-17 DIAGNOSIS — M329 Systemic lupus erythematosus, unspecified: Secondary | ICD-10-CM | POA: Diagnosis not present

## 2016-12-17 DIAGNOSIS — I119 Hypertensive heart disease without heart failure: Secondary | ICD-10-CM | POA: Diagnosis not present

## 2016-12-17 DIAGNOSIS — I739 Peripheral vascular disease, unspecified: Secondary | ICD-10-CM | POA: Diagnosis not present

## 2016-12-17 DIAGNOSIS — I87331 Chronic venous hypertension (idiopathic) with ulcer and inflammation of right lower extremity: Secondary | ICD-10-CM | POA: Diagnosis not present

## 2016-12-17 DIAGNOSIS — L97511 Non-pressure chronic ulcer of other part of right foot limited to breakdown of skin: Secondary | ICD-10-CM | POA: Diagnosis not present

## 2016-12-17 DIAGNOSIS — F329 Major depressive disorder, single episode, unspecified: Secondary | ICD-10-CM | POA: Diagnosis not present

## 2016-12-18 ENCOUNTER — Other Ambulatory Visit: Payer: Self-pay | Admitting: Internal Medicine

## 2016-12-26 ENCOUNTER — Other Ambulatory Visit: Payer: Self-pay | Admitting: Internal Medicine

## 2016-12-31 ENCOUNTER — Other Ambulatory Visit: Payer: Self-pay | Admitting: *Deleted

## 2017-01-06 MED ORDER — BACLOFEN 10 MG PO TABS: 5.0000 mg | ORAL_TABLET | Freq: Three times a day (TID) | ORAL | 1 refills | Status: DC

## 2017-01-07 ENCOUNTER — Other Ambulatory Visit: Payer: Self-pay | Admitting: Internal Medicine

## 2017-01-07 DIAGNOSIS — I10 Essential (primary) hypertension: Secondary | ICD-10-CM

## 2017-01-13 ENCOUNTER — Encounter: Payer: Self-pay | Admitting: Internal Medicine

## 2017-01-13 ENCOUNTER — Encounter: Payer: Medicare Other | Admitting: Physical Medicine & Rehabilitation

## 2017-01-13 ENCOUNTER — Ambulatory Visit (INDEPENDENT_AMBULATORY_CARE_PROVIDER_SITE_OTHER): Payer: Medicare Other | Admitting: Internal Medicine

## 2017-01-13 VITALS — BP 125/65 | HR 85 | Temp 98.0°F | Ht 70.0 in | Wt 178.7 lb

## 2017-01-13 DIAGNOSIS — E669 Obesity, unspecified: Secondary | ICD-10-CM | POA: Diagnosis not present

## 2017-01-13 DIAGNOSIS — Z7982 Long term (current) use of aspirin: Secondary | ICD-10-CM

## 2017-01-13 DIAGNOSIS — Z79899 Other long term (current) drug therapy: Secondary | ICD-10-CM | POA: Diagnosis not present

## 2017-01-13 DIAGNOSIS — Z87891 Personal history of nicotine dependence: Secondary | ICD-10-CM

## 2017-01-13 DIAGNOSIS — Z6825 Body mass index (BMI) 25.0-25.9, adult: Secondary | ICD-10-CM | POA: Diagnosis not present

## 2017-01-13 DIAGNOSIS — I872 Venous insufficiency (chronic) (peripheral): Secondary | ICD-10-CM

## 2017-01-13 DIAGNOSIS — R300 Dysuria: Secondary | ICD-10-CM | POA: Diagnosis present

## 2017-01-13 DIAGNOSIS — Z87440 Personal history of urinary (tract) infections: Secondary | ICD-10-CM

## 2017-01-13 MED ORDER — TRIAMCINOLONE ACETONIDE 0.1 % EX CREA: 1.0000 "application " | TOPICAL_CREAM | Freq: Two times a day (BID) | CUTANEOUS | 2 refills | Status: DC

## 2017-01-13 MED ORDER — NITROFURANTOIN MONOHYD MACRO 100 MG PO CAPS: 100.0000 mg | ORAL_CAPSULE | Freq: Two times a day (BID) | ORAL | 0 refills | Status: DC

## 2017-01-13 MED ORDER — NITROFURANTOIN MONOHYD MACRO 100 MG PO CAPS: 100.0000 mg | ORAL_CAPSULE | Freq: Two times a day (BID) | ORAL | 0 refills | Status: AC

## 2017-01-13 NOTE — Assessment & Plan Note (Addendum)
Patient accompanied by son and nurse. Per son, patient has been complaining of burning with urination for the past 3 days. Denies fevers, chills, abdominal pain, and flank pain. No hematuria or vaginal discharge. She has a history of UTIs in the past that present with similar symptoms. Last UTI documented in 2015 treated with Bactrim. No history of infection with MDR organisms.   Likely UTI given similar symptomatic presentation in the past. Low suspicion for an ascending infection causing pyelonephritis given no flank pain or systemic symptoms. Patient was unable to give us a urine sample today. However, given likelihood of UTI will treat with antibiotic therapy.   Dysuria:  - Macrobid 100mg  x5 days

## 2017-01-13 NOTE — Patient Instructions (Signed)
It was nice to meet you today, [name].   Please start taking nitrofurantoin 100mg . Take 1 tablet two times a day for 5 days.   I refilled your triamcinolone cream for rash in your legs.   Please call [facility] if you have any questions.

## 2017-01-13 NOTE — Progress Notes (Signed)
   CC: Dysuria   HPI:  [name] is a 61 y.o. female with PMH listed below who presents to clinic with son and nurse for dysuria. Please see problem based assessment and plan for further details.   Past Medical History:  Diagnosis Date  . ANEMIA-IRON DEFICIENCY 03/02/2006   H&H 8.5/25.9 5/09. On 5/13 12.8/39.7 with MCV 80.4.     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 Depression   . GERD (gastroesophageal reflux disease)   . Hypertension   . Osteopenia   . 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 SLE (systemic lupus erythematosus) (HCC)   . Stroke (HCC) 1991  . SVT (supraventricular tachycardia) (HCC)    a. 03/2013 with mildly elevated troponin at that time.  . Venous insufficiency (chronic) (peripheral)    Review of Systems:   Review of Systems  Constitutional: Negative for chills, fever, malaise/fatigue and weight loss.  Genitourinary: Positive for dysuria. Negative for flank pain, frequency, hematuria and urgency.    Physical Exam:  Vitals:   01/13/17 1051  BP: 125/65  Pulse: 85  Temp: 98 F (36.7 C)  TempSrc: Oral  SpO2: 100%  Weight: 178 lb 11.2 oz (81.1 kg)  Height: 5\' 10"  (1.778 m)   General: pleasant female, obese, in no acute distress, unable to voice needs  HENT: NCAT, neck supple and FROM, MMM, OP clear without exudates or erythema  Cardiac: regular rate and rhythm, nl S1/S2, no murmurs, rubs or gallops  Pulm: CTAB, no wheezes or crackles, no increased work of breathing  Abd: soft, NTND, bowel sounds present, no CVA tenderness  Ext: warm and well perfused, no peripheral edema  Derm: skin changes consistent with chronic venous stasis dermatitis associated with dry scaling, several healing wounds on R foot    Assessment & Plan:   See Encounters Tab for problem based charting.  Patient seen with Dr. Rogelia Boga

## 2017-01-13 NOTE — Assessment & Plan Note (Signed)
Patient referred to wound care 11/2016 and was discharged recently. Per her nurse, rash is stable and wounds are healing. Rash worse on RLE. No visible visible wounds on exam. Patient denies pain.  - Refilled triamcinolone 0.1%

## 2017-01-15 NOTE — Progress Notes (Signed)
Internal Medicine Clinic Attending  I saw and evaluated the patient.  I personally confirmed the key portions of the history and exam documented by Dr. Santos-Sanchez and I reviewed pertinent patient test results.  The assessment, diagnosis, and plan were formulated together and I agree with the documentation in the resident's note. 

## 2017-01-22 ENCOUNTER — Other Ambulatory Visit: Payer: Self-pay | Admitting: Internal Medicine

## 2017-01-22 DIAGNOSIS — I10 Essential (primary) hypertension: Secondary | ICD-10-CM

## 2017-01-22 DIAGNOSIS — I471 Supraventricular tachycardia: Secondary | ICD-10-CM

## 2017-02-03 ENCOUNTER — Ambulatory Visit (INDEPENDENT_AMBULATORY_CARE_PROVIDER_SITE_OTHER): Payer: Medicare Other | Admitting: Internal Medicine

## 2017-02-03 VITALS — BP 113/76 | HR 78 | Temp 97.9°F

## 2017-02-03 DIAGNOSIS — I69318 Other symptoms and signs involving cognitive functions following cerebral infarction: Secondary | ICD-10-CM

## 2017-02-03 DIAGNOSIS — I69322 Dysarthria following cerebral infarction: Secondary | ICD-10-CM

## 2017-02-03 DIAGNOSIS — Z79899 Other long term (current) drug therapy: Secondary | ICD-10-CM

## 2017-02-03 DIAGNOSIS — Z8249 Family history of ischemic heart disease and other diseases of the circulatory system: Secondary | ICD-10-CM | POA: Diagnosis not present

## 2017-02-03 DIAGNOSIS — Z7982 Long term (current) use of aspirin: Secondary | ICD-10-CM

## 2017-02-03 DIAGNOSIS — Z23 Encounter for immunization: Secondary | ICD-10-CM

## 2017-02-03 DIAGNOSIS — I10 Essential (primary) hypertension: Secondary | ICD-10-CM

## 2017-02-03 DIAGNOSIS — L89319 Pressure ulcer of right buttock, unspecified stage: Secondary | ICD-10-CM | POA: Diagnosis present

## 2017-02-03 DIAGNOSIS — L89309 Pressure ulcer of unspecified buttock, unspecified stage: Secondary | ICD-10-CM | POA: Insufficient documentation

## 2017-02-03 DIAGNOSIS — Z87891 Personal history of nicotine dependence: Secondary | ICD-10-CM | POA: Diagnosis not present

## 2017-02-03 DIAGNOSIS — R21 Rash and other nonspecific skin eruption: Secondary | ICD-10-CM

## 2017-02-03 DIAGNOSIS — L89302 Pressure ulcer of unspecified buttock, stage 2: Secondary | ICD-10-CM

## 2017-02-03 MED ORDER — BARRIER CREAM NON-SPECIFIED: 1.0000 "application " | TOPICAL_CREAM | Freq: Two times a day (BID) | TOPICAL | 3 refills | Status: AC | PRN

## 2017-02-03 MED ORDER — BARRIER CREAM NON-SPECIFIED: 1.0000 "application " | TOPICAL_CREAM | Freq: Two times a day (BID) | TOPICAL | 3 refills | Status: DC | PRN

## 2017-02-03 NOTE — Assessment & Plan Note (Addendum)
See photo in note for detailed view of ulcerations.  Per family they developed around one week ago. Area cleaned and duoderm bandages placed on ulcers today .    -wound care nurse consulted and will come to the home to follow these ulcers and help change bandage.  Pt sent home with extra bandages. -prescribed pt barrier cream as well for skin irritation around diaper.

## 2017-02-03 NOTE — Progress Notes (Signed)
CC: sore on buttocks, rash between thighs, decreased appetite  HPI:  [name] is a 61 y.o. comes in for a sore on her right buttocks that began 1-2 weeks ago the family has been placing barrier cream on it with little result.  She seems to favor the other side when seated or laying down due to the sore. She also has a rash between her thighs that has been present for around one week non pruritic per pt.  Pt has had no systemic symptoms : fevers, chills, SOB, hypotension and feels fine otherwise.   Please see A&P for status of the patient's chronic medical conditions  Past Medical History:  Diagnosis Date  . ANEMIA-IRON DEFICIENCY 03/02/2006   H&H 8.5/25.9 5/09. On 5/13 12.8/39.7 with MCV 80.4.     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 Depression   . GERD (gastroesophageal reflux disease)   . Hypertension   . Osteopenia   . 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 SLE (systemic lupus erythematosus) (HCC)   . Stroke (HCC) 1991  . SVT (supraventricular tachycardia) (HCC)    a. 03/2013 with mildly elevated troponin at that time.  . Venous insufficiency (chronic) (peripheral)    Review of Systems:  ROS limited by pt due to prior CVA pt has cognitive deficits and dysarthria.    Physical Exam:  Vitals:   02/03/17 1110  BP: 113/76  Pulse: 78  Temp: 97.9 F (36.6 C)  TempSrc: Oral  SpO2: 97%   Physical Exam  Constitutional: She appears well-developed and well-nourished.  Eyes: Right eye exhibits no discharge. Left eye exhibits no discharge.  Neck: No JVD present. No thyromegaly present.  Cardiovascular: Normal rate, regular rhythm and normal heart sounds.  Exam reveals no gallop and no friction rub.   No murmur heard. Pulmonary/Chest: Effort normal and breath sounds normal. No respiratory distress. She has no wheezes. She has no rales.  Abdominal: Soft. Bowel sounds are  normal.  Neurological: She is alert.   Media Information     Document Information   Photos    02/03/2017 11:24  Attached To:  [name]  Source Information   Angelita Ingles, MD  [facility]   Media Information     Document Information   Photos    02/03/2017 11:31  Attached To:  [name]  Source Information   Monte Bronder, Kimberlee Nearing, MD  [facility]     Social History   Social History  . Marital status: Widowed    Spouse name: N/A  . Number of children: N/A  . Years of education: N/A   Occupational History  . Not on file.   Social History Main Topics  . Smoking status: Former Smoker    Packs/day: 1.50    Types: Cigarettes    Quit date: 05/30/2003  . Smokeless tobacco: Never Used     Comment: she smoked for 30 years before she quit  . Alcohol use No  . Drug use: No  . Sexual activity: Not on file   Other Topics Concern  . Not on file   Social History Narrative   Has a home nurse,    Lives with her 2 sons,    Former smoker   Widow          Family History  Problem Relation Age of Onset  .  Heart attack Brother 60    Assessment & Plan:   See Encounters Tab for problem based charting.  Patient seen with Dr. Heide Spark

## 2017-02-03 NOTE — Patient Instructions (Signed)
We have placed duoderm patches on the ulcers and have ordered you barrier cream for the inner thighs, please keep the area where the diaper rubs against the skin as protected and dry as possible with that barrier cream.  We will be placing a referral for a wound care nurse to come out to the house in the next few days to follow up on these ulcers and the skin irritation on the inner thigh.

## 2017-02-03 NOTE — Assessment & Plan Note (Signed)
Patient takes lisinopril and metoprolol for hypertension her bp today was 113/76.  Last visit it was 128/77.  Continue lisinopril  daily and metoprolol  daily.

## 2017-02-08 NOTE — Progress Notes (Signed)
Internal Medicine Clinic Attending  I saw and evaluated the patient.  I personally confirmed the key portions of the history and exam documented by Dr. Winfrey and I reviewed pertinent patient test results.  The assessment, diagnosis, and plan were formulated together and I agree with the documentation in the resident's note. 

## 2017-02-09 DIAGNOSIS — Z7982 Long term (current) use of aspirin: Secondary | ICD-10-CM | POA: Diagnosis not present

## 2017-02-09 DIAGNOSIS — G40909 Epilepsy, unspecified, not intractable, without status epilepticus: Secondary | ICD-10-CM | POA: Diagnosis not present

## 2017-02-09 DIAGNOSIS — R21 Rash and other nonspecific skin eruption: Secondary | ICD-10-CM | POA: Diagnosis not present

## 2017-02-09 DIAGNOSIS — F329 Major depressive disorder, single episode, unspecified: Secondary | ICD-10-CM | POA: Diagnosis not present

## 2017-02-09 DIAGNOSIS — Z87891 Personal history of nicotine dependence: Secondary | ICD-10-CM | POA: Diagnosis not present

## 2017-02-09 DIAGNOSIS — Z993 Dependence on wheelchair: Secondary | ICD-10-CM | POA: Diagnosis not present

## 2017-02-09 DIAGNOSIS — G4700 Insomnia, unspecified: Secondary | ICD-10-CM | POA: Diagnosis not present

## 2017-02-09 DIAGNOSIS — Z7952 Long term (current) use of systemic steroids: Secondary | ICD-10-CM | POA: Diagnosis not present

## 2017-02-09 DIAGNOSIS — I872 Venous insufficiency (chronic) (peripheral): Secondary | ICD-10-CM | POA: Diagnosis not present

## 2017-02-09 DIAGNOSIS — L89312 Pressure ulcer of right buttock, stage 2: Secondary | ICD-10-CM | POA: Diagnosis not present

## 2017-02-09 DIAGNOSIS — D509 Iron deficiency anemia, unspecified: Secondary | ICD-10-CM | POA: Diagnosis not present

## 2017-02-09 DIAGNOSIS — J45909 Unspecified asthma, uncomplicated: Secondary | ICD-10-CM | POA: Diagnosis not present

## 2017-02-09 DIAGNOSIS — I10 Essential (primary) hypertension: Secondary | ICD-10-CM | POA: Diagnosis not present

## 2017-02-09 DIAGNOSIS — K5900 Constipation, unspecified: Secondary | ICD-10-CM | POA: Diagnosis not present

## 2017-02-09 DIAGNOSIS — M810 Age-related osteoporosis without current pathological fracture: Secondary | ICD-10-CM | POA: Diagnosis not present

## 2017-02-09 DIAGNOSIS — I69351 Hemiplegia and hemiparesis following cerebral infarction affecting right dominant side: Secondary | ICD-10-CM | POA: Diagnosis not present

## 2017-02-11 DIAGNOSIS — R21 Rash and other nonspecific skin eruption: Secondary | ICD-10-CM | POA: Diagnosis not present

## 2017-02-11 DIAGNOSIS — G40909 Epilepsy, unspecified, not intractable, without status epilepticus: Secondary | ICD-10-CM | POA: Diagnosis not present

## 2017-02-11 DIAGNOSIS — J45909 Unspecified asthma, uncomplicated: Secondary | ICD-10-CM | POA: Diagnosis not present

## 2017-02-11 DIAGNOSIS — I69351 Hemiplegia and hemiparesis following cerebral infarction affecting right dominant side: Secondary | ICD-10-CM | POA: Diagnosis not present

## 2017-02-11 DIAGNOSIS — I10 Essential (primary) hypertension: Secondary | ICD-10-CM | POA: Diagnosis not present

## 2017-02-11 DIAGNOSIS — L89312 Pressure ulcer of right buttock, stage 2: Secondary | ICD-10-CM | POA: Diagnosis not present

## 2017-02-12 ENCOUNTER — Encounter: Payer: Self-pay | Admitting: *Deleted

## 2017-02-12 DIAGNOSIS — G40909 Epilepsy, unspecified, not intractable, without status epilepticus: Secondary | ICD-10-CM | POA: Diagnosis not present

## 2017-02-12 DIAGNOSIS — L89312 Pressure ulcer of right buttock, stage 2: Secondary | ICD-10-CM | POA: Diagnosis not present

## 2017-02-12 DIAGNOSIS — I10 Essential (primary) hypertension: Secondary | ICD-10-CM | POA: Diagnosis not present

## 2017-02-12 DIAGNOSIS — R21 Rash and other nonspecific skin eruption: Secondary | ICD-10-CM | POA: Diagnosis not present

## 2017-02-12 DIAGNOSIS — J45909 Unspecified asthma, uncomplicated: Secondary | ICD-10-CM | POA: Diagnosis not present

## 2017-02-12 DIAGNOSIS — I69351 Hemiplegia and hemiparesis following cerebral infarction affecting right dominant side: Secondary | ICD-10-CM | POA: Diagnosis not present

## 2017-02-16 DIAGNOSIS — I69351 Hemiplegia and hemiparesis following cerebral infarction affecting right dominant side: Secondary | ICD-10-CM | POA: Diagnosis not present

## 2017-02-16 DIAGNOSIS — G40909 Epilepsy, unspecified, not intractable, without status epilepticus: Secondary | ICD-10-CM | POA: Diagnosis not present

## 2017-02-16 DIAGNOSIS — I10 Essential (primary) hypertension: Secondary | ICD-10-CM | POA: Diagnosis not present

## 2017-02-16 DIAGNOSIS — J45909 Unspecified asthma, uncomplicated: Secondary | ICD-10-CM | POA: Diagnosis not present

## 2017-02-16 DIAGNOSIS — L89312 Pressure ulcer of right buttock, stage 2: Secondary | ICD-10-CM | POA: Diagnosis not present

## 2017-02-16 DIAGNOSIS — R21 Rash and other nonspecific skin eruption: Secondary | ICD-10-CM | POA: Diagnosis not present

## 2017-02-19 ENCOUNTER — Telehealth: Payer: Self-pay | Admitting: Internal Medicine

## 2017-02-19 NOTE — Telephone Encounter (Signed)
Stephaine from [facility] wanted to inform the physician that the patient missed her home visit today due she was unable to get in touch with her caregiver she will try next week

## 2017-02-19 NOTE — Telephone Encounter (Signed)
noted 

## 2017-02-20 ENCOUNTER — Other Ambulatory Visit: Payer: Self-pay | Admitting: Internal Medicine

## 2017-02-23 DIAGNOSIS — I69351 Hemiplegia and hemiparesis following cerebral infarction affecting right dominant side: Secondary | ICD-10-CM | POA: Diagnosis not present

## 2017-02-23 DIAGNOSIS — I10 Essential (primary) hypertension: Secondary | ICD-10-CM | POA: Diagnosis not present

## 2017-02-23 DIAGNOSIS — J45909 Unspecified asthma, uncomplicated: Secondary | ICD-10-CM | POA: Diagnosis not present

## 2017-02-23 DIAGNOSIS — R21 Rash and other nonspecific skin eruption: Secondary | ICD-10-CM | POA: Diagnosis not present

## 2017-02-23 DIAGNOSIS — L89312 Pressure ulcer of right buttock, stage 2: Secondary | ICD-10-CM | POA: Diagnosis not present

## 2017-02-23 DIAGNOSIS — G40909 Epilepsy, unspecified, not intractable, without status epilepticus: Secondary | ICD-10-CM | POA: Diagnosis not present

## 2017-02-26 ENCOUNTER — Telehealth: Payer: Self-pay

## 2017-02-26 NOTE — Telephone Encounter (Signed)
Faxed [facility] -[phone].

## 2017-03-01 ENCOUNTER — Other Ambulatory Visit: Payer: Self-pay | Admitting: Internal Medicine

## 2017-03-02 DIAGNOSIS — R21 Rash and other nonspecific skin eruption: Secondary | ICD-10-CM | POA: Diagnosis not present

## 2017-03-02 DIAGNOSIS — L89312 Pressure ulcer of right buttock, stage 2: Secondary | ICD-10-CM | POA: Diagnosis not present

## 2017-03-02 DIAGNOSIS — I10 Essential (primary) hypertension: Secondary | ICD-10-CM | POA: Diagnosis not present

## 2017-03-02 DIAGNOSIS — J45909 Unspecified asthma, uncomplicated: Secondary | ICD-10-CM | POA: Diagnosis not present

## 2017-03-02 DIAGNOSIS — I69351 Hemiplegia and hemiparesis following cerebral infarction affecting right dominant side: Secondary | ICD-10-CM | POA: Diagnosis not present

## 2017-03-02 DIAGNOSIS — G40909 Epilepsy, unspecified, not intractable, without status epilepticus: Secondary | ICD-10-CM | POA: Diagnosis not present

## 2017-03-15 NOTE — Addendum Note (Signed)
Addended by: Neomia DearPOWERS, Tyyonna Soucy E on: 03/15/2017 06:09 PM   Modules accepted: Orders

## 2017-03-23 ENCOUNTER — Telehealth: Payer: Self-pay

## 2017-03-23 NOTE — Telephone Encounter (Signed)
Faxed department of health and Human services [phone] on 03/22/2017.

## 2017-04-21 ENCOUNTER — Encounter: Payer: Self-pay | Admitting: Internal Medicine

## 2017-04-21 ENCOUNTER — Ambulatory Visit (INDEPENDENT_AMBULATORY_CARE_PROVIDER_SITE_OTHER): Payer: Medicare Other | Admitting: Internal Medicine

## 2017-04-21 DIAGNOSIS — I69320 Aphasia following cerebral infarction: Secondary | ICD-10-CM | POA: Diagnosis not present

## 2017-04-21 DIAGNOSIS — J988 Other specified respiratory disorders: Principal | ICD-10-CM

## 2017-04-21 DIAGNOSIS — F339 Major depressive disorder, recurrent, unspecified: Secondary | ICD-10-CM | POA: Diagnosis not present

## 2017-04-21 DIAGNOSIS — Z87891 Personal history of nicotine dependence: Secondary | ICD-10-CM

## 2017-04-21 DIAGNOSIS — I69351 Hemiplegia and hemiparesis following cerebral infarction affecting right dominant side: Secondary | ICD-10-CM

## 2017-04-21 DIAGNOSIS — Z7982 Long term (current) use of aspirin: Secondary | ICD-10-CM | POA: Diagnosis not present

## 2017-04-21 DIAGNOSIS — Z79899 Other long term (current) drug therapy: Secondary | ICD-10-CM

## 2017-04-21 DIAGNOSIS — J069 Acute upper respiratory infection, unspecified: Secondary | ICD-10-CM | POA: Diagnosis present

## 2017-04-21 DIAGNOSIS — B9789 Other viral agents as the cause of diseases classified elsewhere: Secondary | ICD-10-CM | POA: Insufficient documentation

## 2017-04-21 MED ORDER — LORATADINE 10 MG PO TABS: ORAL_TABLET | ORAL | 0 refills | Status: DC

## 2017-04-21 MED ORDER — BENZONATATE 200 MG PO CAPS: 200.0000 mg | ORAL_CAPSULE | Freq: Three times a day (TID) | ORAL | 0 refills | Status: DC | PRN

## 2017-04-21 NOTE — Progress Notes (Signed)
CC: "bad cough"  HPI:  Wanda [name] is a 61 y.o. woman with PMHx as below here for evaluation of cough.  Onset of symptoms was about Friday.  Associated with production of yellow mucus, runny nose, sinus congestion, fatigue, and sick contact over the weekend.  Denies fever, chills, nausea, vomiting, diarrhea, constipation.  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  Had similar episode last year that responded well to supportive care.  Family also raises concern for unintended weight loss.  Pt reports loss of appetite and decreased mood.  Denies any abdominal pain with eating, constipation or other aversion to food.  Weight in Jan 2018 was 215#.  August 2018 down to 178#.  Today she is 172#.  Had labs in July 2018 with normal CBC, normal ferritin, negative FOBT, and unremarkable BMET.  PHQ today is consistently elevated indicating some degree of depression contributing to weight loss potentially.  Past Medical History:  Diagnosis Date  . ANEMIA-IRON DEFICIENCY 03/02/2006   H&H 8.5/25.9 5/09. On 5/13 12.8/39.7 with MCV 80.4.     Marland Kitchen. 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 Depression   . GERD (gastroesophageal reflux disease)   . Hypertension   . Osteopenia   . 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 SLE (systemic lupus erythematosus) (HCC)   . Stroke (HCC) 1991  . SVT (supraventricular tachycardia) (HCC)    a. 03/2013 with mildly elevated troponin at that time.  . Venous insufficiency (chronic) (peripheral)    Review of Systems:  Please see pertinent ROS reviewed in HPI and problem based charting.   Physical Exam:  Vitals:   04/21/17 1058  BP: (!) 111/95  Pulse: 76  Temp: 97.7 F (36.5 C)  TempSrc: Oral  SpO2: 97%  Weight: 172 lb 3.2 oz (78.1 kg)  Height: 5\' 10"  (1.778  m)   General: sitting up in chair, no distress HEENT: NCAT, EOMI, no scleral icterus Cardiac: RRR Pulm: clear to auscultation bilaterally, moving normal volumes of air, no wheeze or crackles ZOX:WRUEAExt:right arm contracted and unable to move her right arm or leg Neuro: alert and oriented X3, she exhibits expressive aphasia and dysarthria but does respond appropriately  Assessment & Plan:   See Encounters Tab for problem based charting.  Patient discussed with Dr. Heide SparkNarendra.  Viral respiratory infection Assessment: Symptoms consistent with viral URI without current evidence to suspect a bacterial infection.    Plan: - will continue robitussin OTC as needed - add claritin 10mg  daily for antihistamine (she was previously on this for seasonal allergies) - add liberal saline nasal irrigation as needed - add tessalon pearls to help with cough - RTC if symptoms fail to improve  DEPRESSION Assessment; PHQ is consistently elevated and she endorses depressed mood which her family members and care givers seems to corroborate.  Currently on venlafexine daily.  Has had about a 25 pound weight loss in last year associated with decreased appetite.  Denies any symptoms that may averse her to food such as pain or constipation.  During this interval she has had fairly unremarkable blood work including negative FOBT but no further imaging.  Her family and patient are agreeable to follow up with PCP at next available for continued discussion regarding weight loss and depression.  Plan: - f/u with  PCP.  Could consider addition of Remeron to help with mood and appetite if further evaluation of weight loss unremarkable.

## 2017-04-21 NOTE — Assessment & Plan Note (Signed)
Assessment: Symptoms consistent with viral URI without current evidence to suspect a bacterial infection.    Plan: - will continue robitussin OTC as needed - add claritin 10mg  daily for antihistamine (she was previously on this for seasonal allergies) - add liberal saline nasal irrigation as needed - add tessalon pearls to help with cough - RTC if symptoms fail to improve

## 2017-04-21 NOTE — Patient Instructions (Signed)
FOLLOW-UP INSTRUCTIONS When: at next available appointment with PCP  For:  To discuss weight loss, depression and appetite What to bring:  Current medications   I think [name] has a viral URI and we will attempt to treat her symptoms supportively.  I have prescribed the following: Claritin to take daily for allergies and congestion.  Tessalon pearls to take as needed for cough.  Continue to use the Robitussin Cough Syrup as needed as well.  Additionally, consider over the counter saline nasal spray to irrigate the sinuses.  You can use this as frequently as you would like.

## 2017-04-21 NOTE — Progress Notes (Signed)
Internal Medicine Clinic Attending  Case discussed with Dr. Wallace at the time of the visit.  We reviewed the resident's history and exam and pertinent patient test results.  I agree with the assessment, diagnosis, and plan of care documented in the resident's note.  

## 2017-04-21 NOTE — Assessment & Plan Note (Signed)
Assessment; PHQ is consistently elevated and she endorses depressed mood which her family members and care givers seems to corroborate.  Currently on venlafexine daily.  Has had about a 25 pound weight loss in last year associated with decreased appetite.  Denies any symptoms that may averse her to food such as pain or constipation.  During this interval she has had fairly unremarkable blood work including negative FOBT but no further imaging.  Her family and patient are agreeable to follow up with PCP at next available for continued discussion regarding weight loss and depression.  Plan: - f/u with PCP.  Could consider addition of Remeron to help with mood and appetite if further evaluation of weight loss unremarkable.

## 2017-05-03 ENCOUNTER — Other Ambulatory Visit: Payer: Self-pay | Admitting: Internal Medicine

## 2017-05-03 DIAGNOSIS — G40909 Epilepsy, unspecified, not intractable, without status epilepticus: Secondary | ICD-10-CM

## 2017-05-19 ENCOUNTER — Telehealth: Payer: Self-pay

## 2017-05-19 NOTE — Telephone Encounter (Signed)
ERROR

## 2017-05-20 ENCOUNTER — Encounter: Payer: Self-pay | Admitting: Internal Medicine

## 2017-05-20 ENCOUNTER — Ambulatory Visit (INDEPENDENT_AMBULATORY_CARE_PROVIDER_SITE_OTHER): Payer: Medicare Other | Admitting: Internal Medicine

## 2017-05-20 VITALS — BP 113/68 | HR 66 | Temp 97.8°F | Ht 70.0 in | Wt 167.6 lb

## 2017-05-20 DIAGNOSIS — F339 Major depressive disorder, recurrent, unspecified: Secondary | ICD-10-CM | POA: Diagnosis not present

## 2017-05-20 DIAGNOSIS — Z6824 Body mass index (BMI) 24.0-24.9, adult: Secondary | ICD-10-CM | POA: Diagnosis not present

## 2017-05-20 DIAGNOSIS — R634 Abnormal weight loss: Secondary | ICD-10-CM

## 2017-05-20 DIAGNOSIS — R7303 Prediabetes: Secondary | ICD-10-CM | POA: Diagnosis not present

## 2017-05-20 DIAGNOSIS — Z79899 Other long term (current) drug therapy: Secondary | ICD-10-CM

## 2017-05-20 DIAGNOSIS — Z87891 Personal history of nicotine dependence: Secondary | ICD-10-CM | POA: Diagnosis not present

## 2017-05-20 DIAGNOSIS — Z90710 Acquired absence of both cervix and uterus: Secondary | ICD-10-CM

## 2017-05-20 DIAGNOSIS — I10 Essential (primary) hypertension: Secondary | ICD-10-CM

## 2017-05-20 DIAGNOSIS — I872 Venous insufficiency (chronic) (peripheral): Secondary | ICD-10-CM

## 2017-05-20 DIAGNOSIS — Z7982 Long term (current) use of aspirin: Secondary | ICD-10-CM | POA: Diagnosis not present

## 2017-05-20 LAB — POCT GLYCOSYLATED HEMOGLOBIN (HGB A1C): Hemoglobin A1C: >14

## 2017-05-20 LAB — GLUCOSE, CAPILLARY: Glucose-Capillary: 264 mg/dL — ABNORMAL HIGH (ref 65–99)

## 2017-05-20 MED ORDER — MIRTAZAPINE 15 MG PO TABS: 15.0000 mg | ORAL_TABLET | Freq: Every day | ORAL | 2 refills | Status: DC

## 2017-05-20 NOTE — Progress Notes (Signed)
   CC: Weight loss  HPI:  [name] is a 62 y.o. female with history noted below that presents to the internal medicine clinic for follow-up on weight loss. Please see problem based charting for the status of patient's chronic medical conditions.  Past Medical History:  Diagnosis Date  . ANEMIA-IRON DEFICIENCY 03/02/2006   H&H 8.5/25.9 5/09. On 5/13 12.8/39.7 with MCV 80.4.     Marland Kitchen. 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 Depression   . GERD (gastroesophageal reflux disease)   . Hypertension   . Osteopenia   . 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 SLE (systemic lupus erythematosus) (HCC)   . Stroke (HCC) 1991  . SVT (supraventricular tachycardia) (HCC)    a. 03/2013 with mildly elevated troponin at that time.  . Venous insufficiency (chronic) (peripheral)     Review of Systems:  Review of Systems  Constitutional: Negative for chills and fever.  Respiratory: Negative for cough and shortness of breath.   Cardiovascular: Negative for chest pain.  Gastrointestinal: Negative for abdominal pain, nausea and vomiting.  Musculoskeletal: Negative for myalgias.  Psychiatric/Behavioral: Positive for depression.     Physical Exam:  Vitals:   05/20/17 1402  BP: 113/68  Pulse: 66  Temp: 97.8 F (36.6 C)  TempSrc: Oral  SpO2: 97%  Weight: 167 lb 9.6 oz (76 kg)  Height: 5\' 10"  (1.778 m)   Physical Exam  Constitutional: She is well-developed, well-nourished, and in no distress.  Cardiovascular: Normal rate, regular rhythm and normal heart sounds. Exam reveals no gallop and no friction rub.  No murmur heard. Pulmonary/Chest: Effort normal and breath sounds normal. No respiratory distress. She has no wheezes. She has no rales.  Skin:  Venous stasis dermatitis of right lower extremity.  No erythema, no calf tenderness, dry and warm to the touch     Assessment & Plan:   See  encounters tab for problem based medical decision making.    Patient discussed with Dr. Josem KaufmannKlima

## 2017-05-20 NOTE — Patient Instructions (Signed)
[name]   It was a pleasure seeing you today. Please start taking mirtazapine 15 mg daily and stop taking venlafaxine. Please follow-up in 2 months

## 2017-05-21 ENCOUNTER — Telehealth: Payer: Self-pay | Admitting: Internal Medicine

## 2017-05-21 ENCOUNTER — Other Ambulatory Visit: Payer: Self-pay | Admitting: Internal Medicine

## 2017-05-21 DIAGNOSIS — R634 Abnormal weight loss: Secondary | ICD-10-CM | POA: Insufficient documentation

## 2017-05-21 DIAGNOSIS — R7303 Prediabetes: Secondary | ICD-10-CM

## 2017-05-21 DIAGNOSIS — E119 Type 2 diabetes mellitus without complications: Secondary | ICD-10-CM | POA: Insufficient documentation

## 2017-05-21 LAB — CMP14 + ANION GAP
A/G RATIO: 1 — AB (ref 1.2–2.2)
ALT: 14 IU/L (ref 0–32)
AST: 18 IU/L (ref 0–40)
Albumin: 4.4 g/dL (ref 3.5–5.5)
Alkaline Phosphatase: 111 IU/L (ref 39–117)
Anion Gap: 20 mmol/L — ABNORMAL HIGH (ref 10.0–18.0)
BUN/Creatinine Ratio: 9 (ref 9–23)
BUN: 6 mg/dL (ref 6–24)
Bilirubin Total: 0.5 mg/dL (ref 0.0–1.2)
CALCIUM: 10 mg/dL (ref 8.7–10.2)
CO2: 25 mmol/L (ref 20–29)
Chloride: 91 mmol/L — ABNORMAL LOW (ref 96–106)
Creatinine, Ser: 0.66 mg/dL (ref 0.57–1.00)
GFR, EST AFRICAN AMERICAN: 112 mL/min/{1.73_m2} (ref >59)
GFR, EST NON AFRICAN AMERICAN: 97 mL/min/{1.73_m2} (ref >59)
GLUCOSE: 267 mg/dL — AB (ref 65–99)
Globulin, Total: 4.2 g/dL (ref 1.5–4.5)
POTASSIUM: 3.9 mmol/L (ref 3.5–5.2)
Sodium: 136 mmol/L (ref 134–144)
TOTAL PROTEIN: 8.6 g/dL — AB (ref 6.0–8.5)

## 2017-05-21 LAB — TSH: TSH: 1.37 u[IU]/mL (ref 0.450–4.500)

## 2017-05-21 MED ORDER — ACCU-CHEK SOFTCLIX LANCETS MISC: 12 refills | Status: DC

## 2017-05-21 MED ORDER — ACCU-CHEK AVIVA DEVI: 0 refills | Status: DC

## 2017-05-21 MED ORDER — METFORMIN HCL ER 500 MG PO TB24: 500.0000 mg | ORAL_TABLET | Freq: Every day | ORAL | 0 refills | Status: DC

## 2017-05-21 MED ORDER — GLUCOSE BLOOD VI STRP: ORAL_STRIP | 12 refills | Status: DC

## 2017-05-21 MED ORDER — INSULIN GLARGINE 100 UNIT/ML SOLOSTAR PEN: 10.0000 [IU] | PEN_INJECTOR | Freq: Every day | SUBCUTANEOUS | 11 refills | Status: DC

## 2017-05-21 NOTE — Assessment & Plan Note (Signed)
Assessment:  Essential hypertension  Patient reports compliance with lisinopril 10mg  daily and metoprolol 50mg  daily.  Patient's blood pressure in office was 113/68 stable and at goal.  Plan Continue lisinopril and metoprolol

## 2017-05-21 NOTE — Telephone Encounter (Signed)
Discussed results of elevated hemoglobin A1c with son, Berna SpareMarcus.  I let them know that supplies for meter have been sent to the pharmacy. Also explained that patient will be started on metformin and Lantus. Told patient's son to record CBGs for the next week and to come back to the clinic in one week with a log. At that time will likely increase metformin.

## 2017-05-21 NOTE — Assessment & Plan Note (Signed)
Assessment: Weight loss  Patient has had a 40 pound weight loss in the past 6 months.  Patient's malignancy workup includes a fit test which was negative in the past year. She does not need cervical cancer screening due to having a hysterectomy. I offered a mammogram as she is overdue however patient declines and states she does not want to have that done. She states that in the past 6 months to 1 year she has had decreased appetite. She denies any abdominal pain, cough, fever/chills, or nausea/vomiting. She states that she is more depressed due to the fact that her family has stopped visiting her as often as they used to. Her pHQ9 is 12 today. Will get a TSH, hemoglobin A1c and CMP to complete weight loss workup. Patient is on venlafaxine 75 mg daily. Will switch this over to mirtazapine 15 mg daily. Will have patient follow-up in 2 months and titrate medication as needed.  Plan -Discontinued venlafaxine -Start mirtazapine 15 mg daily -Follow-up in 2 months to titrate mirtazapine  Addendum:  Patient's hemoglobin A1C was elevated to >14.This may better explain her weight loss.  Called patient and discussed results with patient's son, Wanda [name].  Told to start metformin and lantus 10 units at night and to start recording CBGs.  [facility] to further make adjustments to her medications.  Son expressed understanding

## 2017-05-21 NOTE — Assessment & Plan Note (Addendum)
Assessment:  Prediabetes Patient had an elevated hemoglobin A1C at 6.4 in 2014 and 6.9 in 2016.  Will check hemoglobin A1C today.  Plan -hemoglobin A1C

## 2017-05-26 NOTE — Telephone Encounter (Addendum)
Received call from pt's son Wanda [name] stating [facility] for DM testing supplies and family asks that [facility] [address]. Family also states that they will need rx for pen needles to be sent to pharmacy .  Wanda [name] also gave permission to speak with pt's cna Wanda Carrow([name]) as she will be assisting the patient.  CNA would like to know when to check pt's blood sugar, how many times a day should pt test, if it's low-does she still get lantus at night, and when to call if blood glucose is a certain number. [name] asked to be contacted directly on her cell# [phone].  Will send to pcp for review, please advise.Wanda [name], Wanda Almanzar Cassady1/9/201912:18 PM

## 2017-05-26 NOTE — Addendum Note (Signed)
Addended by: Maura CrandallGOLDSTON, Lauramae Kneisley C on: 05/26/2017 12:29 PM   Modules accepted: Orders

## 2017-05-27 ENCOUNTER — Other Ambulatory Visit: Payer: Self-pay | Admitting: *Deleted

## 2017-05-27 ENCOUNTER — Other Ambulatory Visit: Payer: Self-pay | Admitting: Internal Medicine

## 2017-05-27 MED ORDER — ACCU-CHEK AVIVA DEVI: 0 refills | Status: DC

## 2017-05-27 MED ORDER — ACCU-CHEK SOFTCLIX LANCETS MISC: 12 refills | Status: DC

## 2017-05-27 MED ORDER — GLUCOSE BLOOD VI STRP: ORAL_STRIP | 12 refills | Status: DC

## 2017-05-27 MED ORDER — INSULIN PEN NEEDLE 31G X 5 MM MISC: 0 refills | Status: DC

## 2017-05-27 MED ORDER — ACCU-CHEK AVIVA DEVI: 0 refills | Status: AC

## 2017-05-27 NOTE — Telephone Encounter (Signed)
[facility]  Let me know if there are any issues

## 2017-05-27 NOTE — Telephone Encounter (Signed)
Ok done

## 2017-05-27 NOTE — Telephone Encounter (Signed)
Rxs need to include how often pt checking her blood sugars and dx code in direction section.  Please re-send rx. Thanks

## 2017-05-27 NOTE — Addendum Note (Signed)
Addended by: Maura CrandallGOLDSTON, Yordi Krager C on: 05/27/2017 09:17 AM   Modules accepted: Orders

## 2017-05-31 NOTE — Progress Notes (Signed)
Case discussed with Dr. Hoffman at the time of the visit.  We reviewed the resident's history and exam and pertinent patient test results.  I agree with the assessment, diagnosis, and plan of care documented in the resident's note. 

## 2017-06-01 ENCOUNTER — Telehealth: Payer: Self-pay | Admitting: Dietician

## 2017-06-01 DIAGNOSIS — E119 Type 2 diabetes mellitus without complications: Secondary | ICD-10-CM

## 2017-06-01 NOTE — Telephone Encounter (Signed)
Agree with referral to diabetes self management training and medical nutrition therapy for newly diagnosed diabetes

## 2017-06-01 NOTE — Telephone Encounter (Signed)
Called Wanda [name]. Because I could not get through to Ms. Mcever. She was upset that they do not have pen needles or test strips yet. Assured her that Pen needles, Test strips and  Lancets prescriptions would [facility] [address] [facility]. (They have the meter and lantus and she is taking metformin.) [facility] and waiting for patient's part B Medicare card and pick up.   Appointment set up with certified diabetes educator for next Thursday 06/08/17. Request referral for MNT and DSMT please.

## 2017-06-03 ENCOUNTER — Other Ambulatory Visit: Payer: Self-pay | Admitting: Internal Medicine

## 2017-06-10 ENCOUNTER — Ambulatory Visit: Payer: Medicare Other | Admitting: Dietician

## 2017-06-10 MED ORDER — ACCU-CHEK FASTCLIX LANCETS MISC: 5 refills | Status: DC

## 2017-06-10 NOTE — Telephone Encounter (Signed)
Wanda [name] called to cancel [name] appointment for today and rescheduled for 06/17/17. Wanda [name] says [name] has not begun taking her insulin yet because they cannot get the test strips or the lancets to check her blood sugar. I called the pharmacy: the strips come back as uncovered ($201.98)because the pharmacy has the wrong numbers on the Medicare card. Called Wanda [name] and asked her per the pharmacy to take [name]'s most recent red, white and blue Medicare card to [facility] so they can try running it with that card. I also asked Wanda [name] to call me if after that she still cannot get the strips and lancets

## 2017-06-16 ENCOUNTER — Other Ambulatory Visit: Payer: Self-pay | Admitting: Internal Medicine

## 2017-06-17 ENCOUNTER — Encounter: Payer: Self-pay | Admitting: Dietician

## 2017-06-17 ENCOUNTER — Ambulatory Visit (INDEPENDENT_AMBULATORY_CARE_PROVIDER_SITE_OTHER): Payer: Medicare Other | Admitting: Dietician

## 2017-06-17 DIAGNOSIS — Z993 Dependence on wheelchair: Secondary | ICD-10-CM | POA: Diagnosis not present

## 2017-06-17 DIAGNOSIS — E119 Type 2 diabetes mellitus without complications: Secondary | ICD-10-CM | POA: Diagnosis not present

## 2017-06-17 DIAGNOSIS — Z713 Dietary counseling and surveillance: Secondary | ICD-10-CM | POA: Diagnosis not present

## 2017-06-17 DIAGNOSIS — Z6824 Body mass index (BMI) 24.0-24.9, adult: Secondary | ICD-10-CM | POA: Diagnosis not present

## 2017-06-17 NOTE — Patient Instructions (Signed)
Inject 10 units lantus each evening  Check blood sugar each morning before eating  Bring meter to office appointments, write in book and bring that to office also.   [name] [phone]

## 2017-06-17 NOTE — Progress Notes (Signed)
   Diabetes Self-Management Education  Visit Type: First/Initial  Appt. Start Time: 1030 Appt. End Time: 1130  06/17/2017  [name], identified by name and date of birth, is a 62 y.o. female with a diagnosis of Diabetes: Type 2.   She is wheelchair bound and not appropriate for group. Will do all her diabetes self management training 1:1 here int he office.   ASSESSMENT CBG today 187 fasting She has not been started on insulin yet because they were unable to get the test strips to check her blood sugar. Follow up with me in 1 month.  Weight loss is significant. She  dropped a documented 48# in 6 months, 5 # in the past month.  Her current BMI is normal at 24 and patient reports no woulnds and good appetite. Goal is no more weight loss and maintain current weight. Encouraged family to be sure her good choices are of high nutrient density. Two Boost glucose shakes provided today.  MNt and NFPE at next or future visit.  Diabetes Self-Management Education - 06/17/17 1200      Visit Information   Visit Type  First/Initial      Initial Visit   Diabetes Type  Type 2    Are you currently following a meal plan?  Yes  (Pended)     Date Diagnosed  -- 05/2017      Health Coping   How would you rate your overall health?  Fair       Individualized Plan for Diabetes Self-Management Training:   Learning Objective:  Patient will have a greater understanding of diabetes self-management. Patient education plan is to attend individual and/or group sessions per assessed needs and concerns.   Plan:   Patient Instructions  Inject 10 units lantus each evening  Check blood sugar each morning before eating  Bring meter to office appointments, write in book and bring that to office also.   Wanda [name] [phone]   Expected Outcomes:     Education material provided: Insulin pen needle starter pack, sharps container, accu chek guide meter If problems or questions, patient to contact  team via:  Phone  Future DSME appointment:  4 weeks Wanda [name], RD 06/17/2017 1:42 PM.

## 2017-06-25 ENCOUNTER — Telehealth: Payer: Self-pay | Admitting: Dietician

## 2017-06-25 DIAGNOSIS — E119 Type 2 diabetes mellitus without complications: Secondary | ICD-10-CM

## 2017-06-25 MED ORDER — GLUCOSE BLOOD VI STRP: ORAL_STRIP | 3 refills | Status: DC

## 2017-06-25 NOTE — Telephone Encounter (Signed)
Needs testing supplies for new meter. His mom's blood sugars have ben in the 100s so far.

## 2017-07-12 ENCOUNTER — Other Ambulatory Visit: Payer: Self-pay | Admitting: Internal Medicine

## 2017-07-16 ENCOUNTER — Ambulatory Visit (INDEPENDENT_AMBULATORY_CARE_PROVIDER_SITE_OTHER): Payer: Medicare Other | Admitting: Dietician

## 2017-07-16 ENCOUNTER — Encounter: Payer: Self-pay | Admitting: Internal Medicine

## 2017-07-16 ENCOUNTER — Ambulatory Visit (INDEPENDENT_AMBULATORY_CARE_PROVIDER_SITE_OTHER): Payer: Medicare Other | Admitting: Internal Medicine

## 2017-07-16 ENCOUNTER — Other Ambulatory Visit: Payer: Self-pay

## 2017-07-16 VITALS — BP 102/51 | HR 78 | Temp 97.7°F | Ht 70.0 in | Wt 178.2 lb

## 2017-07-16 DIAGNOSIS — Z6825 Body mass index (BMI) 25.0-25.9, adult: Secondary | ICD-10-CM | POA: Diagnosis not present

## 2017-07-16 DIAGNOSIS — E119 Type 2 diabetes mellitus without complications: Secondary | ICD-10-CM

## 2017-07-16 DIAGNOSIS — Z794 Long term (current) use of insulin: Secondary | ICD-10-CM

## 2017-07-16 DIAGNOSIS — Z993 Dependence on wheelchair: Secondary | ICD-10-CM | POA: Diagnosis not present

## 2017-07-16 DIAGNOSIS — Z7982 Long term (current) use of aspirin: Secondary | ICD-10-CM | POA: Diagnosis not present

## 2017-07-16 DIAGNOSIS — Z713 Dietary counseling and surveillance: Secondary | ICD-10-CM

## 2017-07-16 DIAGNOSIS — Z87891 Personal history of nicotine dependence: Secondary | ICD-10-CM | POA: Diagnosis not present

## 2017-07-16 DIAGNOSIS — I872 Venous insufficiency (chronic) (peripheral): Secondary | ICD-10-CM

## 2017-07-16 DIAGNOSIS — I8311 Varicose veins of right lower extremity with inflammation: Secondary | ICD-10-CM

## 2017-07-16 LAB — POCT GLYCOSYLATED HEMOGLOBIN (HGB A1C): Hemoglobin A1C: 9.9

## 2017-07-16 LAB — GLUCOSE, CAPILLARY: GLUCOSE-CAPILLARY: 116 mg/dL — AB (ref 65–99)

## 2017-07-16 MED ORDER — METFORMIN HCL ER 500 MG PO TB24: 500.0000 mg | ORAL_TABLET | Freq: Every day | ORAL | 3 refills | Status: DC

## 2017-07-16 MED ORDER — INSULIN PEN NEEDLE 31G X 5 MM MISC: 0 refills | Status: DC

## 2017-07-16 MED ORDER — INSULIN PEN NEEDLE 32G X 4 MM MISC: 3 refills | Status: DC

## 2017-07-16 MED ORDER — ACCU-CHEK FASTCLIX LANCETS MISC: 5 refills | Status: AC

## 2017-07-16 NOTE — Progress Notes (Signed)
Diabetes Self-Management Education  Visit Type: Follow-up  Appt. Start Time: 1030 Appt. End Time: 1115  07/16/2017  [name], identified by name and date of birth, is a 62 y.o. female with a diagnosis of Diabetes: Type 2.   ASSESSMENT  Estimated body mass index is 25.57 kg/m as calculated from the following:   Height as of an earlier encounter on 07/16/17: 5\' 10"  (1.778 m).   Weight as of an earlier encounter on 07/16/17: 178 lb 3.2 oz (80.8 kg).  Lab Results  Component Value Date   HGBA1C 9.9 07/16/2017     Diabetes Self-Management Education - 07/16/17 1100      Visit Information   Visit Type  Follow-up      Initial Visit   Diabetes Type  Type 2    Are you taking your medications as prescribed?  Yes      Health Coping   How would you rate your overall health?  Good      Dietary Intake   Breakfast          Subsequent Visit   Since your last visit have you continued or begun to take your medications as prescribed?  Yes    Since your last visit have you had your blood pressure checked?  Yes    Since your last visit have you experienced any weight changes?  Gain       Individualized Plan for Diabetes Self-Management Training:   Learning Objective:  Patient will have a greater understanding of diabetes self-management. Patient education plan is to attend individual and/or group sessions per assessed needs and concerns.   My plan to support myself in continuing these changes to care for my diabetes is to attend or contact:   Her two sons and her aide are her main supports  -doctor's office, CDE, Data processing managerDietitian, pharmacist, church  .  Plan:   Patient Instructions  Good job  caring for your diabetes!  Please try to eat one fruit and one vegetable each day.  Please make a follow up in 3 months on the same day your see the doctor.   Wanda LeashDonna [phone]     Expected Outcomes:   lowered A1C and verbalizes diabetes knowledge  Education material provided: yes-  meal planning and high blood sugar If problems or questions, patient to contact team via:  Phone  Future DSME appointment:   3 months Wanda [name], RD 07/16/2017 12:48 PM.

## 2017-07-16 NOTE — Patient Instructions (Addendum)
Good job  caring for your diabetes!  Please try to eat one fruit and one vegetable each day.  Please make a follow up in 3 months on the same day your see the doctor.   [name] [phone]

## 2017-07-16 NOTE — Progress Notes (Signed)
   CC: Diabetes, right lower extremity swelling  HPI:  Wanda [name] is a 62 y.o. F with PMHx listed below presenting for Diabetes, right lower extremity swelling. Please see the A&P for the status of the patient's chronic medical problems.   Past Medical History:  Diagnosis Date  . ANEMIA-IRON DEFICIENCY 03/02/2006   H&H 8.5/25.9 5/09. On 5/13 12.8/39.7 with MCV 80.4.     Marland Kitchen. 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 Depression   . GERD (gastroesophageal reflux disease)   . Hypertension   . Osteopenia   . 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 SLE (systemic lupus erythematosus) (HCC)   . Stroke (HCC) 1991  . SVT (supraventricular tachycardia) (HCC)    a. 03/2013 with mildly elevated troponin at that time.  . Venous insufficiency (chronic) (peripheral)    Review of Systems: Performed and all others negative.  Physical Exam:  Vitals:   07/16/17 1017  BP: (!) 102/51  Pulse: 78  Temp: 97.7 F (36.5 C)  SpO2: 100%  Weight: 178 lb 3.2 oz (80.8 kg)   Physical Exam  Constitutional: She appears well-developed.  Wheelchair bound  HENT:  Head: Normocephalic and atraumatic.  Cardiovascular: Normal rate, regular rhythm, normal heart sounds and intact distal pulses.  Pulmonary/Chest: Effort normal and breath sounds normal. No respiratory distress.  Abdominal: Soft. Bowel sounds are normal. She exhibits no distension. There is no tenderness.  Musculoskeletal:  Contracture of extremities 1+ RLE edema, minimal erythema significantly improved from 6 months ago Warmth subsided with removal of tight slippers for a period of time  Skin: Skin is warm and dry.    Assessment & Plan:   See Encounters Tab for problem based charting.  Patient discussed with Dr. Rogelia BogaButcher

## 2017-07-16 NOTE — Assessment & Plan Note (Addendum)
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 She has been seen in the past for this. On 6/13 she had significant worsening of her right lower extremity swelling and skin breakdown, she was referred to wound care and had ABIs Performed (right: 1.07 and left 1.03) Her right lower extremity improved significantly since that time. Son states that she has some waxing and waning swelling and waxing and waning of skin redness. 1+ edema and mild erythema today. Do not suspect cellulitis at his time. Patient denies fever, pain at the site, or changes out of the ordinary. Warm initially on exam, but this improved after a couple minutes of having her sock and tight house slipper off.  - Continue to monitor - Return precautions given

## 2017-07-16 NOTE — Assessment & Plan Note (Addendum)
Hgb A1c today is 9.9. This is much improved from Jan/2019 when A1c was >14. This elevated A1c was discovered during workup for weight loss as described in that note from 05/21/2017. She was started on Metformin 500 daily ans Lantus 10 units qhs. Her weight has begun to recover since initiating her medications. She did run out of her Metformin per the patients son. Son and patients aide report blood sugar in the 90-130 range at home though they did not bring the meter. - Refill Metformin 500mg  Daily - Continue Lantus 10mg  qhs - Refill lancets and pen needles - Follow up in 3 months

## 2017-07-16 NOTE — Patient Instructions (Addendum)
Thank you for allowing us to care for you  For your diabetes: - Your A1c today was 9.9, much improved from >14 at last visit - Continue taking Metformin and Lantus as prescribed - You received a foot exam today - You have been provided with a referral to an eye doctor - You have been provided refills of metformin, lancets, and pen needles  For your Venous stasis: - Your right foot does not appear to be infected at this time - Continue to monitor and if your experience signs of infection as below including persistent fever, pain, and worsening redness at the site; please return to clinic for reevaluation.  Please return in 3 months for follow up.   Cellulitis, Adult Cellulitis is a skin infection. The infected area is usually red and sore. This condition occurs most often in the arms and lower legs. It is very important to get treated for this condition. Follow these instructions at home:  Take over-the-counter and prescription medicines only as told by your doctor.  If you were prescribed an antibiotic medicine, take it as told by your doctor. Do not stop taking the antibiotic even if you start to feel better.  Drink enough fluid to keep your pee (urine) clear or pale yellow.  Do not touch or rub the infected area.  Raise (elevate) the infected area above the level of your heart while you are sitting or lying down.  Place warm or cold wet cloths (warm or cold compresses) on the infected area. Do this as told by your doctor.  Keep all follow-up visits as told by your doctor. This is important. These visits let your doctor make sure your infection is not getting worse. Contact a doctor if:  You have a fever.  Your symptoms do not get better after 1-2 days of treatment.  Your bone or joint under the infected area starts to hurt after the skin has healed.  Your infection comes back. This can happen in the same area or another area.  You have a swollen bump in the infected  area.  You have new symptoms.  You feel ill and also have muscle aches and pains. Get help right away if:  Your symptoms get worse.  You feel very sleepy.  You throw up (vomit) or have watery poop (diarrhea) for a long time.  There are red streaks coming from the infected area.  Your red area gets larger.  Your red area turns darker. This information is not intended to replace advice given to you by your health care provider. Make sure you discuss any questions you have with your health care provider. Document Released: 10/21/2007 Document Revised: 10/10/2015 Document Reviewed: 03/13/2015 Elsevier Interactive Patient Education  2018 [employer]

## 2017-07-20 NOTE — Progress Notes (Signed)
Internal Medicine Clinic Attending  Case discussed with Dr. Melvin  at the time of the visit.  We reviewed the resident's history and exam and pertinent patient test results.  I agree with the assessment, diagnosis, and plan of care documented in the resident's note.  

## 2017-07-28 ENCOUNTER — Other Ambulatory Visit: Payer: Self-pay | Admitting: Internal Medicine

## 2017-07-28 DIAGNOSIS — G40909 Epilepsy, unspecified, not intractable, without status epilepticus: Secondary | ICD-10-CM

## 2017-08-02 ENCOUNTER — Telehealth: Payer: Self-pay | Admitting: Dietician

## 2017-08-02 NOTE — Telephone Encounter (Deleted)
-----   Message from Burns SpainElizabeth A Butcher, MD sent at 07/21/2017  2:22 PM EST ----- She was sch 3 month F/U bc CBG at goal. IF not at goal when you call, pls feel free to sch earlier appt ----- Message ----- From: Erikson Danzy, Cecil Crankeronna M, RD Sent: 07/20/2017   4:46 PM To: Burns SpainElizabeth A Butcher, MD  Yes. I will. This will give me opportunity to reinforce why I asked son to begin getting her to have at least 1  Fruit & veggie each day  She is eating a high fat and starchy diet- problably running her sugars up during the day.   Lupita Leashonna  ----- Message ----- From: Burns SpainButcher, Elizabeth A, MD Sent: 07/20/2017   3:26 PM To: Cecil Crankeronna M Shermika Balthaser, RD  [name] Would you pls call pt in a few weeks to ensure CBG still at goal despite high A1C? Thanks

## 2017-08-02 NOTE — Telephone Encounter (Signed)
Spoke with her son Berna Sparemarcus who says her Blood sugar mostly 80-90s, only checks in the  Mornings. She is eating a fruit and vegetable every day and BMs are okay. I asked him to check her blood sugars 1-2 time a week later in the day either before dinner or bedtime and if more than 200 to give us a call. He verbalized understanding.

## 2017-08-03 NOTE — Telephone Encounter (Signed)
Agree. Thanks

## 2017-08-13 ENCOUNTER — Other Ambulatory Visit: Payer: Self-pay | Admitting: Internal Medicine

## 2017-08-25 DIAGNOSIS — H25812 Combined forms of age-related cataract, left eye: Secondary | ICD-10-CM | POA: Diagnosis not present

## 2017-08-25 DIAGNOSIS — H25811 Combined forms of age-related cataract, right eye: Secondary | ICD-10-CM | POA: Diagnosis not present

## 2017-08-31 ENCOUNTER — Other Ambulatory Visit: Payer: Self-pay

## 2017-08-31 NOTE — Telephone Encounter (Signed)
Confirmed with Pharmacist at [facility] that test strips are all ready for pick up. No need for refill at this time. Attempt[facility] to [facility] to let him know. No answer and no VM set up. Kinnie FeilL. Ducatte, RN, BSN

## 2017-08-31 NOTE — Telephone Encounter (Signed)
glucose blood (ACCU-CHEK GUIDE) test strip, Refill request @ [facility] on [address].

## 2017-09-16 ENCOUNTER — Telehealth: Payer: Self-pay | Admitting: Internal Medicine

## 2017-09-21 ENCOUNTER — Other Ambulatory Visit: Payer: Self-pay

## 2017-09-21 ENCOUNTER — Ambulatory Visit (INDEPENDENT_AMBULATORY_CARE_PROVIDER_SITE_OTHER): Payer: Medicare Other | Admitting: Internal Medicine

## 2017-09-21 VITALS — BP 180/85 | HR 87 | Temp 97.8°F | Ht 69.0 in | Wt 183.0 lb

## 2017-09-21 DIAGNOSIS — L859 Epidermal thickening, unspecified: Secondary | ICD-10-CM | POA: Diagnosis not present

## 2017-09-21 DIAGNOSIS — I69351 Hemiplegia and hemiparesis following cerebral infarction affecting right dominant side: Secondary | ICD-10-CM | POA: Diagnosis not present

## 2017-09-21 DIAGNOSIS — Z7982 Long term (current) use of aspirin: Secondary | ICD-10-CM | POA: Diagnosis not present

## 2017-09-21 DIAGNOSIS — I639 Cerebral infarction, unspecified: Secondary | ICD-10-CM

## 2017-09-21 DIAGNOSIS — Z87891 Personal history of nicotine dependence: Secondary | ICD-10-CM

## 2017-09-21 DIAGNOSIS — I471 Supraventricular tachycardia, unspecified: Secondary | ICD-10-CM

## 2017-09-21 DIAGNOSIS — Z79899 Other long term (current) drug therapy: Secondary | ICD-10-CM | POA: Diagnosis not present

## 2017-09-21 DIAGNOSIS — I872 Venous insufficiency (chronic) (peripheral): Secondary | ICD-10-CM

## 2017-09-21 DIAGNOSIS — I10 Essential (primary) hypertension: Secondary | ICD-10-CM

## 2017-09-21 DIAGNOSIS — G40909 Epilepsy, unspecified, not intractable, without status epilepticus: Secondary | ICD-10-CM

## 2017-09-21 DIAGNOSIS — E119 Type 2 diabetes mellitus without complications: Secondary | ICD-10-CM

## 2017-09-21 MED ORDER — INSULIN GLARGINE 100 UNIT/ML SOLOSTAR PEN: 10.0000 [IU] | PEN_INJECTOR | Freq: Every day | SUBCUTANEOUS | 1 refills | Status: DC

## 2017-09-21 MED ORDER — HYDROCHLOROTHIAZIDE 25 MG PO TABS: 25.0000 mg | ORAL_TABLET | Freq: Every day | ORAL | 2 refills | Status: DC

## 2017-09-21 MED ORDER — TRAMADOL HCL 50 MG PO TABS: 50.0000 mg | ORAL_TABLET | Freq: Four times a day (QID) | ORAL | 0 refills | Status: AC | PRN

## 2017-09-21 MED ORDER — GABAPENTIN 300 MG PO CAPS: 300.0000 mg | ORAL_CAPSULE | Freq: Three times a day (TID) | ORAL | 2 refills | Status: DC

## 2017-09-21 MED ORDER — OMEPRAZOLE 20 MG PO CPDR: 20.0000 mg | DELAYED_RELEASE_CAPSULE | Freq: Every day | ORAL | 2 refills | Status: DC

## 2017-09-21 MED ORDER — METOPROLOL TARTRATE 50 MG PO TABS: 75.0000 mg | ORAL_TABLET | Freq: Two times a day (BID) | ORAL | 2 refills | Status: DC

## 2017-09-21 MED ORDER — LISINOPRIL 10 MG PO TABS: 10.0000 mg | ORAL_TABLET | Freq: Every day | ORAL | 2 refills | Status: DC

## 2017-09-21 MED ORDER — TRIAMCINOLONE 0.1 % CREAM:EUCERIN CREAM 1:1: 1.0000 "application " | TOPICAL_CREAM | Freq: Two times a day (BID) | CUTANEOUS | 0 refills | Status: AC

## 2017-09-21 MED ORDER — POTASSIUM CHLORIDE CRYS ER 10 MEQ PO TBCR: 10.0000 meq | EXTENDED_RELEASE_TABLET | Freq: Two times a day (BID) | ORAL | 2 refills | Status: AC

## 2017-09-21 MED ORDER — SENNOSIDES-DOCUSATE SODIUM 8.6-50 MG PO TABS: 2.0000 | ORAL_TABLET | Freq: Two times a day (BID) | ORAL | 2 refills | Status: AC

## 2017-09-21 MED ORDER — METFORMIN HCL ER 500 MG PO TB24: 500.0000 mg | ORAL_TABLET | Freq: Every day | ORAL | 2 refills | Status: DC

## 2017-09-21 MED ORDER — ATORVASTATIN CALCIUM 40 MG PO TABS: 40.0000 mg | ORAL_TABLET | Freq: Every day | ORAL | 2 refills | Status: DC

## 2017-09-21 MED ORDER — VENLAFAXINE HCL ER 75 MG PO CP24: 75.0000 mg | ORAL_CAPSULE | Freq: Every day | ORAL | 2 refills | Status: DC

## 2017-09-21 MED ORDER — MIRTAZAPINE 15 MG PO TABS: 15.0000 mg | ORAL_TABLET | Freq: Every day | ORAL | 2 refills | Status: DC

## 2017-09-21 MED ORDER — LEVETIRACETAM 500 MG PO TABS: 500.0000 mg | ORAL_TABLET | Freq: Two times a day (BID) | ORAL | 2 refills | Status: DC

## 2017-09-21 NOTE — Progress Notes (Signed)
CC: Foot swelling  HPI:  [name] is a 62 y.o. 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  She has long-standing edema with associated erythema and skin changes in the right foot due to her right sided hemiplegia from previous CVA.  This is treated at home using topical emollients and intermittently has worn compression socks.  She has not noticed any skin tears or drainage associated with this.  She has had previous vascular study showing chronic venous insufficiency with normal ABIs.  She is on numerous medications that she needs refilled notably antihypertensives which she could not take today having already run out.  Numerous other problems were addressed tangentially for medicine reconciliation and [facility] on [address] today.  See problem based assessment and plan below for additional details.  Venous insufficiency (chronic) (peripheral) Her foot pain today appears highly consistent with stasis dermatitis.  She has no fevers, there is no purulence or new skin ulcers suggesting an infectious cellulitis today.  It is difficult to maintain foot elevation at rest due to her hemiparesis.  She is wearing compression socks intermittently at home.  She takes hydrochlorothiazide 25 mg daily until running out.  She reports mild intolerance to Lasix and does not have evidence of overall volume overload or bilateral edema needing diuresis. P: Recommend starting topical Eucerin to help improve skin integrity and hyperkeratosis Recommend starting daily use of compression socks as tolerable If symptoms are getting worse despite this or cannot tolerate the compression socks she may need to be evaluated for starting wraps, not a candidate for Unna boot at this time given relatively mild skin damage at this time She was previously taking low-dose tramadol  as needed for this pain, refill this for a short duration to help tolerating more aggressive compression therapy   Past Medical History:  Diagnosis Date  . ANEMIA-IRON DEFICIENCY 03/02/2006   H&H 8.5/25.9 5/09. On 5/13 12.8/39.7 with MCV 80.4.     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 Depression   . GERD (gastroesophageal reflux disease)   . Hypertension   . Osteopenia   . 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 SLE (systemic lupus erythematosus) (HCC)   . Stroke (HCC) 1991  . SVT (supraventricular tachycardia) (HCC)    a. 03/2013 with mildly elevated troponin at that time.  . Venous insufficiency (chronic) (peripheral)     Review of Systems: Review of Systems  Constitutional: Negative for fever.  Respiratory: Negative for shortness of breath.   Cardiovascular: Positive for leg swelling. Negative for chest pain.  Gastrointestinal: Negative for abdominal pain.  Skin: Positive for rash.  Neurological: Negative for sensory change.     Physical Exam: Vitals:   09/21/17 1132  BP: (!) 180/85  Pulse: 87  Temp: 97.8 F (36.6 C)  TempSrc: Oral  SpO2: 96%  Weight: 183 lb (83 kg)  Height:  (1.753 m)   GENERAL- alert, co-operative, resting in wheelchair HEENT- Atraumatic, oral mucosa appears moist CARDIAC- RRR, no murmurs, rubs or gallops. RESP- CTAB, no wheezes or crackles. NEURO- right upper and lower extremity weakness with partial contracture in flexed position EXTREMITIES-right foot is edematous 1+ pitting with erythema extending up to the ankle and above that hyperpigmentation consistent with chronic venous stasis.  Skin integrity is  fair with some peeling and hyperkeratosis on the medial and posterior sides, there is a small area of skin breakdown versus abrasion on the superior aspect of the foot, with no ulceration SKIN-erythematous as above PSYCH- Normal mood and  affect, appropriate thought content and speech.   Assessment & Plan:   See encounters tab for problem based medical decision making.   Patient discussed with Dr. Cleda Daub

## 2017-09-21 NOTE — Patient Instructions (Signed)
I think your right foot inflammation is due to stasis dermatitis, or pooling of fluid in the foot for too long. Important treatments for this are topical emollients (moisturizing medicine). And compression when able. You can use compression socks but if these are not working we could consider wound care/home health compression wraps.  If the swelling does not improve despite use of compression daily we can try an oral fluid pill but I do not see a strong reason to start a systemic medicine at this time.  I have reordered all of your active medications to [facility] on [address]

## 2017-09-21 NOTE — Assessment & Plan Note (Addendum)
Her foot pain today appears highly consistent with stasis dermatitis.  She has no fevers, there is no purulence or new skin ulcers suggesting an infectious cellulitis today.  It is difficult to maintain foot elevation at rest due to her hemiparesis.  She is wearing compression socks intermittently at home.  She takes hydrochlorothiazide 25 mg daily until running out.  She reports mild intolerance to Lasix and does not have evidence of overall volume overload or bilateral edema needing diuresis. P: Recommend starting topical Eucerin to help improve skin integrity and hyperkeratosis Recommend starting daily use of compression socks as tolerable If symptoms are getting worse despite this or cannot tolerate the compression socks she may need to be evaluated for starting wraps, not a candidate for Unna boot at this time given relatively mild skin damage at this time She was previously taking low-dose tramadol as needed for this pain, refill this for a short duration to help tolerating more aggressive compression therapy

## 2017-09-22 NOTE — Progress Notes (Signed)
Internal Medicine Clinic Attending  Case discussed with Dr. Rice at the time of the visit.  We reviewed the resident's history and exam and pertinent patient test results.  I agree with the assessment, diagnosis, and plan of care documented in the resident's note.  

## 2017-09-28 ENCOUNTER — Telehealth: Payer: Self-pay | Admitting: *Deleted

## 2017-09-28 ENCOUNTER — Encounter: Payer: Self-pay | Admitting: Sports Medicine

## 2017-09-28 ENCOUNTER — Ambulatory Visit (INDEPENDENT_AMBULATORY_CARE_PROVIDER_SITE_OTHER): Payer: Medicare Other | Admitting: Sports Medicine

## 2017-09-28 DIAGNOSIS — M79672 Pain in left foot: Secondary | ICD-10-CM

## 2017-09-28 DIAGNOSIS — R0989 Other specified symptoms and signs involving the circulatory and respiratory systems: Secondary | ICD-10-CM

## 2017-09-28 DIAGNOSIS — I739 Peripheral vascular disease, unspecified: Secondary | ICD-10-CM | POA: Diagnosis not present

## 2017-09-28 DIAGNOSIS — M79671 Pain in right foot: Secondary | ICD-10-CM

## 2017-09-28 DIAGNOSIS — B351 Tinea unguium: Secondary | ICD-10-CM | POA: Diagnosis not present

## 2017-09-28 NOTE — Progress Notes (Signed)
Patient ID: Wanda [name], female   DOB: June 03, 1955, 62 y.o.   MRN: [id number] Subjective: Wanda [name] is a 62 y.o. female patient seen today in office with complaint of painful thickened and elongated toenails; unable to trim. Patient has history of stroke and has been dealing with increased pain on right. Skin has gotten better but pain has worsened. Admits has venous test done and test done many years ago. Patient is assisted by son and family caregiver who help to report this history.   Patient Active Problem List   Diagnosis Date Noted  . Venous insufficiency (chronic) (peripheral) 07/16/2017  . Type II diabetes mellitus (HCC) 05/21/2017  . Weight loss 05/21/2017  . Diastolic dysfunction 08/31/2013  . Right hemiparesis (HCC) 07/18/2013  . SVT (supraventricular tachycardia) (HCC) 03/18/2013  . Seasonal allergies 09/21/2012  . Osteoporosis 09/21/2012  . Health care maintenance 11/27/2010  . INSOMNIA 09/19/2009  . Asthma 07/13/2006  . DEPRESSION 03/02/2006  . Essential hypertension 03/02/2006  . GERD 03/02/2006  . SEIZURE DISORDER 03/02/2006  . CVA (cerebral infarction) 03/02/2006    Current Outpatient Medications on File Prior to Visit  Medication Sig Dispense Refill  . ACCU-CHEK FASTCLIX LANCETS MISC Check blood sugar 3 times a day 102 each 5  . aspirin 81 MG tablet Take 81 mg by mouth daily.      Marland Kitchen atorvastatin (LIPITOR) 40 MG tablet Take 1 tablet (40 mg total) by mouth daily. 30 tablet 2  . baclofen (LIORESAL) 10 MG tablet Take 0.5 tablets (5 mg total) by mouth 3 (three) times daily. 135 tablet 1  . Blood Glucose Monitoring Suppl (ACCU-CHEK AVIVA) device Diagnosis code: E11.9, needs to check blood sugars 3 times a day 1 each 0  . Blood Pressure Monitoring (BLOOD PRESSURE CUFF) MISC Does not apply route 1 each 0  . calcium-vitamin D (OSCAL) 250-125 MG-UNIT per tablet Take 1 tablet by mouth daily.      . Cholecalciferol (REPLESTA) 50000 UNITS WAFR Take 50,000 Units by mouth  every 7 (seven) days. On wednesdays    . denosumab (PROLIA) 60 MG/ML SOLN injection Inject 60 mg into the skin every 6 (six) months. Administer in upper arm, thigh, or abdomen  0  . EASY COMFORT PEN NEEDLES 31G X 8 MM MISC for use with lantus EVERY DAY  12  . gabapentin (NEURONTIN) 300 MG capsule Take 1 capsule (300 mg total) by mouth 3 (three) times daily. 90 capsule 2  . glucose blood (ACCU-CHEK GUIDE) test strip Use as instructed to check blood sugar one time a day 100 each 3  . hydrochlorothiazide (HYDRODIURIL) 25 MG tablet Take 1 tablet (25 mg total) by mouth daily. 30 tablet 2  . Insulin Glargine (LANTUS) 100 UNIT/ML Solostar Pen Inject 10 Units into the skin daily at 10 pm. 15 mL 1  . levETIRAcetam (KEPPRA) 500 MG tablet Take 1 tablet (500 mg total) by mouth 2 (two) times daily. 60 tablet 2  . lisinopril (PRINIVIL,ZESTRIL) 10 MG tablet Take 1 tablet (10 mg total) by mouth daily. 30 tablet 2  . metFORMIN (GLUCOPHAGE XR) 500 MG 24 hr tablet Take 1 tablet (500 mg total) by mouth daily with breakfast. 30 tablet 2  . metoprolol tartrate (LOPRESSOR) 50 MG tablet Take 1.5 tablets (75 mg total) by mouth 2 (two) times daily. 90 tablet 2  . mirtazapine (REMERON) 15 MG tablet Take 1 tablet (15 mg total) by mouth at bedtime. 30 tablet 2  . Multiple Vitamins-Minerals (ADULT ONE DAILY GUMMIES)  CHEW Chew 2 each by mouth daily.    Marland Kitchen omeprazole (PRILOSEC) 20 MG capsule Take 1 capsule (20 mg total) by mouth daily. 30 capsule 2  . potassium chloride (K-DUR,KLOR-CON) 10 MEQ tablet Take 1 tablet (10 mEq total) by mouth 2 (two) times daily. 60 tablet 2  . senna-docusate (SENOKOT-S) 8.6-50 MG tablet Take 2 tablets by mouth 2 (two) times daily. 120 tablet 2  . traMADol (ULTRAM) 50 MG tablet Take 1 tablet (50 mg total) by mouth every 6 (six) hours as needed for moderate pain. 30 tablet 0  . Triamcinolone Acetonide (TRIAMCINOLONE 0.1 % CREAM : EUCERIN) CREA Apply 1 application topically 2 (two) times daily.  0   No  current facility-administered medications on file prior to visit.     Allergies  Allergen Reactions  . Lasix [Furosemide] Swelling    mild L facial swelling  . Penicillins Swelling  . Sulfur Itching    Objective: Physical Exam  General: Well developed, nourished, no acute distress, awake, alert and oriented x 3  Vascular: Dorsalis pedis artery 0/4 bilateral, Posterior tibial artery 1/4 bilateral, skin temperature warm to warm proximal to distal bilateral lower extremities, no varicosities. Mild venous rash to right dorsal lateral foot with hyperpigmentation. No acute signs of infection, no pedal hair present bilateral. Trace edema right>left.  Neurological: Gross sensation present via light touch bilateral. Patient is hyper-sensitive bilateral.   Dermatological: Skin is warm, dry, and supple bilateral, Nails 1-10 are tender, long, thick, and discolored with moderate subungal debris, no webspace macerations present bilateral, no open lesions present bilateral, no callus/corns/hyperkeratotic tissue present bilateral. No signs of infection bilateral.  Musculoskeletal:Bunion and hammertoe boney deformities noted bilateral. Muscular strength within normal limits without pain on range of motion on left, 4/5 on right. No pain with calf compression bilateral.  Assessment and Plan:  Problem List Items Addressed This Visit    None    Visit Diagnoses    Dermatophytosis of nail    -  Primary   Foot pain, bilateral       PVD (peripheral vascular disease) (HCC)         -Examined patient.  -Discussed treatment options for painful mycotic nails. -Mechanically debrided and reduced mycotic nails with sterile nail nipper and dremel nail file without incident. -ABIs performed in office 0.87 bilateral, consult placed for further eval of pain especially on right in the setting of mixed venous skin changes   -Patient to return in 3 months for follow up evaluation or sooner if symptoms  worsen.  Asencion Islam, DPM

## 2017-09-28 NOTE — Telephone Encounter (Signed)
-----   Message from Asencion Islam, [place] sent at 09/28/2017 12:11 PM EDT ----- Regarding: ABIs in office  0.87 bil Make sure patient sees vascular

## 2017-09-28 NOTE — Telephone Encounter (Signed)
Faxed referral, clinicals and demographics to [facility]. 

## 2017-09-29 NOTE — Addendum Note (Signed)
Addended by: Fritz Pickerel A on: 09/29/2017 04:51 PM   Modules accepted: Orders

## 2017-10-04 NOTE — Telephone Encounter (Signed)
[place] - MEDICAID NOTIFICATION STATES, "

## 2017-10-13 ENCOUNTER — Other Ambulatory Visit: Payer: Self-pay | Admitting: Internal Medicine

## 2017-10-21 ENCOUNTER — Ambulatory Visit: Payer: Medicare Other

## 2017-11-02 ENCOUNTER — Other Ambulatory Visit: Payer: Self-pay | Admitting: Vascular Surgery

## 2017-11-02 ENCOUNTER — Ambulatory Visit (INDEPENDENT_AMBULATORY_CARE_PROVIDER_SITE_OTHER): Payer: Medicare Other | Admitting: Vascular Surgery

## 2017-11-02 ENCOUNTER — Encounter: Payer: Self-pay | Admitting: Vascular Surgery

## 2017-11-02 ENCOUNTER — Other Ambulatory Visit: Payer: Self-pay

## 2017-11-02 VITALS — BP 138/86 | HR 85 | Temp 97.7°F | Resp 20 | Ht 69.0 in | Wt 183.0 lb

## 2017-11-02 DIAGNOSIS — I872 Venous insufficiency (chronic) (peripheral): Secondary | ICD-10-CM

## 2017-11-02 DIAGNOSIS — I639 Cerebral infarction, unspecified: Secondary | ICD-10-CM | POA: Diagnosis not present

## 2017-11-02 MED ORDER — SILVER SULFADIAZINE 1 % EX CREA: 1.0000 "application " | TOPICAL_CREAM | CUTANEOUS | 0 refills | Status: AC

## 2017-11-02 MED ORDER — SILVER SULFADIAZINE 1 % EX CREA: 1.0000 "application " | TOPICAL_CREAM | CUTANEOUS | 0 refills | Status: DC

## 2017-11-02 NOTE — Progress Notes (Signed)
[facility]  Patient name: Wanda [name] MRN: [id number] DOB: 1955-08-11 Sex: female  REASON FOR CONSULT: Right foot erythema and swelling  HPI: Wanda [name] is a 62 y.o. female, who is here today evaluation of her right foot.  She is a very unfortunate 62 year old female who had ruptured intracranial aneurysm and dense stroke.  His right hemiparesis and expressive aphasia.  She is here today with her family in a wheelchair.  She has had progressive swelling and discoloration and had a open wound in her right foot in the past which was eventually healed at a wound center.  She continues to have pain and swelling and excoriation over the dorsum of her foot.  Is seen here for evaluation of potential arterial or venous causes of this.  She does not walk and is wheelchair-bound.  Past Medical History:  Diagnosis Date  . ANEMIA-IRON DEFICIENCY 03/02/2006   H&H 8.5/25.9 5/09. On 5/13 12.8/39.7 with MCV 80.4.     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 Depression   . GERD (gastroesophageal reflux disease)   . Hypertension   . Osteopenia   . 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 SLE (systemic lupus erythematosus) (HCC)   . Stroke (HCC) 1991  . SVT (supraventricular tachycardia) (HCC)    a. 03/2013 with mildly elevated troponin at that time.  . Venous insufficiency (chronic) (peripheral)     Family History  Problem Relation Age of Onset  . Heart attack Brother 95    SOCIAL HISTORY: Social History   Socioeconomic History  . Marital status: Widowed    Spouse name: Not on file  . Number of children: Not on file  . Years of education: Not on file  . Highest education level: Not on file  Occupational History  . Not on file  Social Needs  . Financial resource strain: Not on file  . Food insecurity:    Worry: Not on  file    Inability: Not on file  . Transportation needs:    Medical: Not on file    Non-medical: Not on file  Tobacco Use  . Smoking status: Former Smoker    Packs/day: 1.50    Types: Cigarettes    Last attempt to quit: 05/29/1978    Years since quitting: 39.4  . Smokeless tobacco: Never Used  . Tobacco comment: she smoked for 30 years before she quit  Substance and Sexual Activity  . Alcohol use: No    Alcohol/week: 0.0 oz  . Drug use: No  . Sexual activity: Not on file  Lifestyle  . Physical activity:    Days per week: Not on file    Minutes per session: Not on file  . Stress: Not on file  Relationships  . Social connections:    Talks on phone: Not on file    Gets together: Not on file    Attends religious service: Not on file    Active member of club or organization: Not on file    Attends meetings of clubs or organizations: Not on file    Relationship status: Not on file  . Intimate partner violence:    Fear of current or ex partner: Not on file    Emotionally abused: Not on file    Physically abused: Not on file    Forced sexual activity: Not on file  Other Topics Concern  . Not on file  Social History Narrative   Has a home nurse,    Lives with her 2 sons,    Former smoker   Widow          Allergies  Allergen Reactions  . Lasix [Furosemide] Swelling    mild L facial swelling  . Penicillins Swelling  . Sulfur Itching    Current Outpatient Medications  Medication Sig Dispense Refill  . ACCU-CHEK FASTCLIX LANCETS MISC Check blood sugar 3 times a day 102 each 5  . aspirin 81 MG tablet Take 81 mg by mouth daily.      Marland Kitchen atorvastatin (LIPITOR) 40 MG tablet Take 1 tablet (40 mg total) by mouth daily. 30 tablet 2  . baclofen (LIORESAL) 10 MG tablet Take 0.5 tablets (5 mg total) by mouth 3 (three) times daily. 135 tablet 1  . Blood Glucose Monitoring Suppl (ACCU-CHEK AVIVA) device Diagnosis code: E11.9, needs to check blood sugars 3 times a day 1 each 0  .  Blood Pressure Monitoring (BLOOD PRESSURE CUFF) MISC Does not apply route 1 each 0  . calcium-vitamin D (OSCAL) 250-125 MG-UNIT per tablet Take 1 tablet by mouth daily.      . Cholecalciferol (REPLESTA) 50000 UNITS WAFR Take 50,000 Units by mouth every 7 (seven) days. On wednesdays    . denosumab (PROLIA) 60 MG/ML SOLN injection Inject 60 mg into the skin every 6 (six) months. Administer in upper arm, thigh, or abdomen  0  . EASY COMFORT PEN NEEDLES 31G X 8 MM MISC for use with lantus EVERY DAY  12  . gabapentin (NEURONTIN) 300 MG capsule Take 1 capsule (300 mg total) by mouth 3 (three) times daily. 90 capsule 2  . glucose blood (ACCU-CHEK GUIDE) test strip Use as instructed to check blood sugar one time a day 100 each 3  . hydrochlorothiazide (HYDRODIURIL) 25 MG tablet Take 1 tablet (25 mg total) by mouth daily. 30 tablet 2  . Insulin Glargine (LANTUS) 100 UNIT/ML Solostar Pen Inject 10 Units into the skin daily at 10 pm. 15 mL 1  . levETIRAcetam (KEPPRA) 500 MG tablet Take 1 tablet (500 mg total) by mouth 2 (two) times daily. 60 tablet 2  . lisinopril (PRINIVIL,ZESTRIL) 10 MG tablet Take 1 tablet (10 mg total) by mouth daily. 30 tablet 2  . metFORMIN (GLUCOPHAGE XR) 500 MG 24 hr tablet Take 1 tablet (500 mg total) by mouth daily with breakfast. 30 tablet 2  . metoprolol tartrate (LOPRESSOR) 50 MG tablet Take 1.5 tablets (75 mg total) by mouth 2 (two) times daily. 90 tablet 2  . mirtazapine (REMERON) 15 MG tablet Take 1 tablet (15 mg total) by mouth at bedtime. 30 tablet 2  . Multiple Vitamins-Minerals (ADULT ONE DAILY GUMMIES) CHEW Chew 2 each by mouth daily.    Marland Kitchen omeprazole (PRILOSEC) 20 MG capsule Take 1 capsule (20 mg total) by mouth daily. 30 capsule 2  . potassium chloride (K-DUR,KLOR-CON) 10 MEQ tablet Take 1 tablet (10 mEq total) by mouth 2 (two) times daily. 60 tablet 2  . senna-docusate (SENOKOT-S) 8.6-50 MG tablet Take 2 tablets by mouth 2 (two) times daily. 120 tablet 2  . traMADol  (ULTRAM) 50 MG tablet Take 1 tablet (50 mg total) by mouth every 6 (six) hours as needed for moderate pain. 30 tablet 0  . Triamcinolone Acetonide (TRIAMCINOLONE 0.1 % CREAM : EUCERIN) CREA Apply 1 application topically 2 (two) times daily.  0   No  current facility-administered medications for this visit.     REVIEW OF SYSTEMS:  [X]  denotes positive finding, [ ]  denotes negative finding Cardiac  Comments:  Chest pain or chest pressure:    Shortness of breath upon exertion: x   Short of breath when lying flat:    Irregular heart rhythm:        Vascular    Pain in calf, thigh, or hip brought on by ambulation:    Pain in feet at night that wakes you up from your sleep:  x   Blood clot in your veins:    Leg swelling:  x       Pulmonary    Oxygen at home:    Productive cough:     Wheezing:  x       Neurologic    Sudden weakness in arms or legs:     Sudden numbness in arms or legs:     Sudden onset of difficulty speaking or slurred speech:    Temporary loss of vision in one eye:     Problems with dizziness:         Gastrointestinal    Blood in stool:     Vomited blood:         Genitourinary    Burning when urinating:     Blood in urine:        Psychiatric    Major depression:         Hematologic    Bleeding problems:    Problems with blood clotting too easily:        Skin    Rashes or ulcers:        Constitutional    Fever or chills:      PHYSICAL EXAM: Vitals:   11/02/17 1011  BP: 138/86  Pulse: 85  Resp: 20  Temp: 97.7 F (36.5 C)  TempSrc: Oral  SpO2: 95%  Weight: 183 lb (83 kg)  Height: 5\' 9"  (1.753 m)    GENERAL: The patient is a well-nourished female, in no acute distress. The vital signs are documented above. CARDIOVASCULAR: Easily palpable left dorsalis pedis pulse.  She does have a great deal of swelling and thickening and I cannot feel the dorsalis pedis on the right. PULMONARY: There is good air exchange  ABDOMEN: Soft and non-tender    MUSCULOSKELETAL: There are no major deformities or cyanosis. NEUROLOGIC: No focal weakness or paresthesias are detected. SKIN: Circumferential hemosiderin deposits from her right mid calf down to her foot.  No open wounds but very fragile excoriated skin over the dorsum of her foot PSYCHIATRIC: The patient has a normal affect.  DATA:  She is undergone formal arterial and venous studies in May 2018.  This showed completely normal ankle arm index and triphasic waveforms bilaterally.  Her duplex of her veins was 4 DVT only and this was normal.  Recent screening outpatient ankle arm index was 0.87 bilaterally  Saphenous vein with SonoSite ultrasound in our office and this is dilated and does have reflux at the level of the thigh and knee.  MEDICAL ISSUES: I long discussion with the patient and her family present.  I did explain the critical need for compression.  She has tried compression garments in the past and had extreme pain associated with these and was not possible for her to wear these.  I did explain the option of wrapping with Ace wrap and she has tolerated this.  I have recommended that we begin Silvadene ointment to  the excoriated areas on her foot and then wrapped this with Kerlix and Ace wrap to be changed every several days.  We have done this wrap for her today and have a prescription for Silvadene.  She will be seen back in our office in the vein center with Dr. Durwin Noraixon in 3 months to determine if this is effectively treating her venous hypertension.  She will also have a formal venous reflux study on her right leg at that time to determine if she would be a candidate for ablation of her saphenous vein for improvement in her venous hypertension   Larina Earthlyodd F. Adreana Coull, MD [facility] Office Tel [phone] Pager [phone]

## 2017-11-03 ENCOUNTER — Other Ambulatory Visit: Payer: Self-pay

## 2017-11-03 DIAGNOSIS — I872 Venous insufficiency (chronic) (peripheral): Secondary | ICD-10-CM

## 2017-11-03 DIAGNOSIS — R600 Localized edema: Secondary | ICD-10-CM

## 2017-11-15 ENCOUNTER — Other Ambulatory Visit: Payer: Self-pay | Admitting: Internal Medicine

## 2017-11-15 NOTE — Telephone Encounter (Signed)
Next appt scheduled 8/13 with PCP. 

## 2017-12-01 ENCOUNTER — Other Ambulatory Visit: Payer: Self-pay | Admitting: Internal Medicine

## 2017-12-01 DIAGNOSIS — I10 Essential (primary) hypertension: Secondary | ICD-10-CM

## 2017-12-02 NOTE — Telephone Encounter (Signed)
Next appt scheduled 8/13 with PCP. 

## 2017-12-15 ENCOUNTER — Other Ambulatory Visit: Payer: Self-pay | Admitting: Internal Medicine

## 2017-12-16 ENCOUNTER — Other Ambulatory Visit: Payer: Self-pay | Admitting: Internal Medicine

## 2017-12-16 DIAGNOSIS — E119 Type 2 diabetes mellitus without complications: Secondary | ICD-10-CM

## 2017-12-16 DIAGNOSIS — I10 Essential (primary) hypertension: Secondary | ICD-10-CM

## 2017-12-16 DIAGNOSIS — I471 Supraventricular tachycardia: Secondary | ICD-10-CM

## 2017-12-17 NOTE — Telephone Encounter (Signed)
Next appt scheduled 8/13 with PCP. 

## 2017-12-27 NOTE — Progress Notes (Signed)
   CC: Follow up on type II diabetes and essential hypertension  HPI:  Wanda [name] is a 62 y.o.female with history noted below that presents to the [facility] for follow up on type II diabetes and essential hypertension.  Please see problem based charting for the status of patient's chronic medical condition.  Past Medical History:  Diagnosis Date  . ANEMIA-IRON DEFICIENCY 03/02/2006   H&H 8.5/25.9 5/09. On 5/13 12.8/39.7 with MCV 80.4.     Marland Kitchen. 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 Depression   . GERD (gastroesophageal reflux disease)   . Hypertension   . Osteopenia   . 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 SLE (systemic lupus erythematosus) (HCC)   . Stroke (HCC) 1991  . SVT (supraventricular tachycardia) (HCC)    a. 03/2013 with mildly elevated troponin at that time.  . Venous insufficiency (chronic) (peripheral)     Review of Systems:  Review of Systems  Constitutional: Negative for weight loss.  Respiratory: Negative for shortness of breath.   Cardiovascular: Negative for chest pain.  Psychiatric/Behavioral: Negative for depression.     Physical Exam:  Vitals:   12/28/17 1337 12/28/17 1421  BP: (!) 151/75 127/75  Pulse: 78   Temp: 98 F (36.7 C)   TempSrc: Oral    Physical Exam  Constitutional: She is well-developed, well-nourished, and in no distress.  Cardiovascular: Normal rate, regular rhythm and normal heart sounds. Exam reveals no gallop and no friction rub.  No murmur heard. Pulmonary/Chest: Effort normal and breath sounds normal. No respiratory distress. She has no wheezes. She has no rales.     Assessment & Plan:   See encounters tab for problem based medical decision making.   Patient discussed with Dr. Heide SparkNarendra

## 2017-12-28 ENCOUNTER — Ambulatory Visit (INDEPENDENT_AMBULATORY_CARE_PROVIDER_SITE_OTHER): Payer: Medicare Other | Admitting: Internal Medicine

## 2017-12-28 ENCOUNTER — Other Ambulatory Visit: Payer: Self-pay

## 2017-12-28 ENCOUNTER — Encounter: Payer: Self-pay | Admitting: Internal Medicine

## 2017-12-28 ENCOUNTER — Other Ambulatory Visit: Payer: Self-pay | Admitting: Internal Medicine

## 2017-12-28 VITALS — BP 127/75 | HR 78 | Temp 98.0°F

## 2017-12-28 DIAGNOSIS — Z0000 Encounter for general adult medical examination without abnormal findings: Secondary | ICD-10-CM

## 2017-12-28 DIAGNOSIS — E119 Type 2 diabetes mellitus without complications: Secondary | ICD-10-CM

## 2017-12-28 DIAGNOSIS — Z7982 Long term (current) use of aspirin: Secondary | ICD-10-CM | POA: Diagnosis not present

## 2017-12-28 DIAGNOSIS — I10 Essential (primary) hypertension: Secondary | ICD-10-CM | POA: Diagnosis not present

## 2017-12-28 DIAGNOSIS — Z79899 Other long term (current) drug therapy: Secondary | ICD-10-CM

## 2017-12-28 DIAGNOSIS — Z1231 Encounter for screening mammogram for malignant neoplasm of breast: Secondary | ICD-10-CM

## 2017-12-28 DIAGNOSIS — Z87891 Personal history of nicotine dependence: Secondary | ICD-10-CM

## 2017-12-28 DIAGNOSIS — Z794 Long term (current) use of insulin: Secondary | ICD-10-CM | POA: Diagnosis not present

## 2017-12-28 LAB — GLUCOSE, CAPILLARY: Glucose-Capillary: 103 mg/dL — ABNORMAL HIGH (ref 70–99)

## 2017-12-28 LAB — POCT GLYCOSYLATED HEMOGLOBIN (HGB A1C): HEMOGLOBIN A1C: 6.5 % — AB (ref 4.0–5.6)

## 2017-12-28 NOTE — Patient Instructions (Signed)
[name],  It was a pleasure seeing you today. Please decrease your Lantus to 5 units at night. Please increase your metformin to 500 mg twice a day for 1 week. Follow up in 1 month

## 2017-12-29 DIAGNOSIS — Z0000 Encounter for general adult medical examination without abnormal findings: Secondary | ICD-10-CM | POA: Diagnosis not present

## 2017-12-30 ENCOUNTER — Encounter: Payer: Medicare Other | Admitting: Internal Medicine

## 2017-12-30 NOTE — Assessment & Plan Note (Signed)
Assessment: Essential hypertension  Patient reports compliance with lisinopril 10mg  daily and metoprolol 50mg  daily. Patient's blood pressure in office was 127/75 stable and at goal.  Plan Continue lisinopril and metoprolol

## 2017-12-30 NOTE — Assessment & Plan Note (Addendum)
Assessment:  Healthcare maintenance Patient due for Mammogram and fit test vs colonoscopy.  Patient opted for the fit test.  Mammogram referral placed  Plan -fit test -mammogram screening

## 2017-12-30 NOTE — Assessment & Plan Note (Signed)
HPI: Patient reports taking metformin 500mg  daily and lantus 10 units.  Glucose meter shows cbg average of 121 with highest cbg reading of 148 and lowest of 99.  Majority of readings are from the afternoon after lunch.   Hemoglobin A1C is 6.5 today.  Assessment:  Controlled Type II diabetes mellitus Patient's hemoglobin A1C has improved greatly since last recording of 9.9 in march.  Patient was diagnosed in Jan 2019 with diabetes after workup for weightloss.  At that time hgb A1C was >14.  At this time will lower lantus from 10 units to 5 units in anticipation of discontinuing long acting insulin all together.  Will increase metformin from 500mg  daily to twice daily.    Plan -lantus 5 units -metformin 500mg  BID - follow up in one month

## 2017-12-31 NOTE — Progress Notes (Signed)
Internal Medicine Clinic Attending  Case discussed with Dr. Hoffman at the time of the visit.  We reviewed the resident's history and exam and pertinent patient test results.  I agree with the assessment, diagnosis, and plan of care documented in the resident's note.  

## 2018-01-04 ENCOUNTER — Ambulatory Visit (INDEPENDENT_AMBULATORY_CARE_PROVIDER_SITE_OTHER): Payer: Medicare Other | Admitting: Sports Medicine

## 2018-01-04 ENCOUNTER — Encounter: Payer: Self-pay | Admitting: Sports Medicine

## 2018-01-04 DIAGNOSIS — R0989 Other specified symptoms and signs involving the circulatory and respiratory systems: Secondary | ICD-10-CM | POA: Diagnosis not present

## 2018-01-04 DIAGNOSIS — B351 Tinea unguium: Secondary | ICD-10-CM | POA: Diagnosis not present

## 2018-01-04 DIAGNOSIS — Z8673 Personal history of transient ischemic attack (TIA), and cerebral infarction without residual deficits: Secondary | ICD-10-CM

## 2018-01-04 DIAGNOSIS — M79671 Pain in right foot: Secondary | ICD-10-CM | POA: Diagnosis not present

## 2018-01-04 DIAGNOSIS — I739 Peripheral vascular disease, unspecified: Secondary | ICD-10-CM

## 2018-01-04 DIAGNOSIS — M79672 Pain in left foot: Secondary | ICD-10-CM | POA: Diagnosis not present

## 2018-01-04 NOTE — Progress Notes (Signed)
Patient ID: Wanda PrimesShirley A Zawadzki, female   DOB: 05/25/1955, 62 y.o.   MRN: [id number] Subjective: Wanda [name] is a 62 y.o. female patient seen today in office with complaint of painful thickened and elongated toenails; unable to trim. Patient has history of stroke. Reports vascular surgery on 01-26-18. Patient is assisted by son and family caregiver who help to report this history.   Patient Active Problem List   Diagnosis Date Noted  . Venous insufficiency (chronic) (peripheral) 07/16/2017  . Type II diabetes mellitus (HCC) 05/21/2017  . Weight loss 05/21/2017  . Diastolic dysfunction 08/31/2013  . Right hemiparesis (HCC) 07/18/2013  . SVT (supraventricular tachycardia) (HCC) 03/18/2013  . Seasonal allergies 09/21/2012  . Osteoporosis 09/21/2012  . Health care maintenance 11/27/2010  . INSOMNIA 09/19/2009  . Asthma 07/13/2006  . DEPRESSION 03/02/2006  . Essential hypertension 03/02/2006  . GERD 03/02/2006  . SEIZURE DISORDER 03/02/2006  . CVA (cerebral infarction) 03/02/2006    Current Outpatient Medications on File Prior to Visit  Medication Sig Dispense Refill  . ACCU-CHEK FASTCLIX LANCETS MISC Check blood sugar 3 times a day 102 each 5  . aspirin 81 MG tablet Take 81 mg by mouth daily.      Marland Kitchen. atorvastatin (LIPITOR) 40 MG tablet Take 1 tablet (40 mg total) by mouth daily. 30 tablet 2  . baclofen (LIORESAL) 10 MG tablet TAKE 0.5 TABLETS (5 MG TOTAL) BY MOUTH 3 (THREE) TIMES DAILY. 135 tablet 1  . Blood Glucose Monitoring Suppl (ACCU-CHEK AVIVA) device Diagnosis code: E11.9, needs to check blood sugars 3 times a day 1 each 0  . Blood Pressure Monitoring (BLOOD PRESSURE CUFF) MISC Does not apply route 1 each 0  . calcium-vitamin D (OSCAL) 250-125 MG-UNIT per tablet Take 1 tablet by mouth daily.      . Cholecalciferol (REPLESTA) 50000 UNITS WAFR Take 50,000 Units by mouth every 7 (seven) days. On wednesdays    . denosumab (PROLIA) 60 MG/ML SOLN injection Inject 60 mg into the skin  every 6 (six) months. Administer in upper arm, thigh, or abdomen  0  . EASY COMFORT PEN NEEDLES 31G X 8 MM MISC for use with lantus EVERY DAY  12  . gabapentin (NEURONTIN) 300 MG capsule TAKE 1 CAPSULE BY MOUTH THREE TIMES A DAY 90 capsule 2  . glucose blood (ACCU-CHEK GUIDE) test strip Use as instructed to check blood sugar one time a day 100 each 3  . hydrochlorothiazide (HYDRODIURIL) 25 MG tablet TAKE 1 TABLET BY MOUTH EVERY DAY 90 tablet 0  . Insulin Glargine (LANTUS) 100 UNIT/ML Solostar Pen Inject 10 Units into the skin daily at 10 pm. 15 mL 1  . levETIRAcetam (KEPPRA) 500 MG tablet Take 1 tablet (500 mg total) by mouth 2 (two) times daily. 60 tablet 2  . lisinopril (PRINIVIL,ZESTRIL) 10 MG tablet Take 1 tablet (10 mg total) by mouth daily. 30 tablet 2  . metFORMIN (GLUCOPHAGE-XR) 500 MG 24 hr tablet TAKE 1 TABLET BY MOUTH EVERY DAY WITH BREAKFAST 90 tablet 0  . metoprolol tartrate (LOPRESSOR) 50 MG tablet TAKE 1.5 TABLETS (75 MG TOTAL) BY MOUTH 2 (TWO) TIMES DAILY. 270 tablet 0  . mirtazapine (REMERON) 15 MG tablet Take 1 tablet (15 mg total) by mouth at bedtime. 30 tablet 2  . Multiple Vitamins-Minerals (ADULT ONE DAILY GUMMIES) CHEW Chew 2 each by mouth daily.    Marland Kitchen. omeprazole (PRILOSEC) 20 MG capsule TAKE 1 CAPSULE BY MOUTH EVERY DAY 90 capsule 0  . potassium chloride (K-DUR,KLOR-CON)  10 MEQ tablet Take 1 tablet (10 mEq total) by mouth 2 (two) times daily. 60 tablet 2  . senna-docusate (SENOKOT-S) 8.6-50 MG tablet Take 2 tablets by mouth 2 (two) times daily. 120 tablet 2  . silver sulfADIAZINE (SILVADENE) 1 % cream Apply 1 application topically as directed. Apply to the top of right foot.  Then wrap with Kerlix and Ace wrap.  To be changed every several days. 50 g 0  . traMADol (ULTRAM) 50 MG tablet Take 1 tablet (50 mg total) by mouth every 6 (six) hours as needed for moderate pain. 30 tablet 0  . Triamcinolone Acetonide (TRIAMCINOLONE 0.1 % CREAM : EUCERIN) CREA Apply 1 application  topically 2 (two) times daily.  0   No current facility-administered medications on file prior to visit.     Allergies  Allergen Reactions  . Lasix [Furosemide] Swelling    mild L facial swelling  . Penicillins Swelling  . Sulfur Itching    Objective: Physical Exam  General: Well developed, nourished, no acute distress, awake, alert and oriented x 3  Vascular: Dorsalis pedis artery 0/4 bilateral, Posterior tibial artery 1/4 bilateral, skin temperature warm to warm proximal to distal bilateral lower extremities, no varicosities. Mild venous rash to right dorsal lateral foot with hyperpigmentation. No acute signs of infection, no pedal hair present bilateral. Trace edema right>left.  Neurological: Gross sensation present via light touch bilateral. Patient is hyper-sensitive bilateral.   Dermatological: Skin is warm, dry, and supple bilateral, Nails 1-10 are tender, long, thick, and discolored with moderate subungal debris, no webspace macerations present bilateral, no open lesions present bilateral, no callus/corns/hyperkeratotic tissue present bilateral. No signs of infection bilateral.  Musculoskeletal:Bunion and hammertoe boney deformities noted bilateral. Muscular strength within normal limits without pain on range of motion on left, 4/5 on right. No pain with calf compression bilateral.  Assessment and Plan:  Problem List Items Addressed This Visit    None    Visit Diagnoses    Dermatophytosis of nail    -  Primary   PAD (peripheral artery disease) (HCC)       Diminished pulses in lower extremity       Foot pain, bilateral       PVD (peripheral vascular disease) (HCC)       History of stroke         -Examined patient.  -Discussed treatment options for painful mycotic nails. -Mechanically debrided and reduced mycotic nails with sterile nail nipper and dremel nail file without incident. -Awaiting vascular procedure on 01-26-18 -Patient to return in 3 months for follow up  evaluation or sooner if symptoms worsen.  Asencion Islamitorya Edwyna Dangerfield, DPM

## 2018-01-07 ENCOUNTER — Other Ambulatory Visit: Payer: Medicare Other

## 2018-01-07 DIAGNOSIS — Z0000 Encounter for general adult medical examination without abnormal findings: Secondary | ICD-10-CM

## 2018-01-09 ENCOUNTER — Other Ambulatory Visit: Payer: Self-pay | Admitting: Internal Medicine

## 2018-01-09 LAB — FECAL OCCULT BLOOD, IMMUNOCHEMICAL: FECAL OCCULT BLD: NEGATIVE

## 2018-01-26 ENCOUNTER — Ambulatory Visit (HOSPITAL_COMMUNITY)
Admission: RE | Admit: 2018-01-26 | Discharge: 2018-01-26 | Disposition: A | Discharge status: Home / Self Care | Payer: Medicare Other | Source: Ambulatory Visit | Attending: Vascular Surgery | Admitting: Vascular Surgery

## 2018-01-26 ENCOUNTER — Encounter: Payer: Self-pay | Admitting: Vascular Surgery

## 2018-01-26 ENCOUNTER — Ambulatory Visit (INDEPENDENT_AMBULATORY_CARE_PROVIDER_SITE_OTHER): Payer: Medicare Other | Admitting: Vascular Surgery

## 2018-01-26 VITALS — BP 115/76 | HR 80 | Temp 97.6°F | Resp 18 | Ht 69.0 in | Wt 183.0 lb

## 2018-01-26 DIAGNOSIS — I872 Venous insufficiency (chronic) (peripheral): Secondary | ICD-10-CM | POA: Diagnosis not present

## 2018-01-26 DIAGNOSIS — I639 Cerebral infarction, unspecified: Secondary | ICD-10-CM | POA: Diagnosis not present

## 2018-01-26 DIAGNOSIS — R600 Localized edema: Secondary | ICD-10-CM

## 2018-01-26 NOTE — Progress Notes (Signed)
Patient name: Wanda [name] MRN: [id number] DOB: 1955-12-10 Sex: female  REASON FOR VISIT:   Follow-up of chronic venous insufficiency.  HPI:   Wanda [name] is a pleasant 62 y.o. female who was seen by Dr. Tawanna Cooler Early on 11/02/2017.  She had chronic venous insufficiency.  She had some deep venous reflux involving the common femoral vein and also reflux in an anterior accessory vein.  She is tried thigh-high compression stockings and comes in for a 18-month follow-up visit.  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  She is nonambulatory.  She spends most of the day sitting up in her wheelchair according to the family.  She is had significant leg swelling especially on the right.   Current Outpatient Medications  Medication Sig Dispense Refill  . ACCU-CHEK FASTCLIX LANCETS MISC Check blood sugar 3 times a day 102 each 5  . aspirin 81 MG tablet Take 81 mg by mouth daily.      Marland Kitchen atorvastatin (LIPITOR) 40 MG tablet Take 1 tablet (40 mg total) by mouth daily. 30 tablet 2  . baclofen (LIORESAL) 10 MG tablet TAKE 0.5 TABLETS (5 MG TOTAL) BY MOUTH 3 (THREE) TIMES DAILY. 135 tablet 1  . Blood Glucose Monitoring Suppl (ACCU-CHEK AVIVA) device Diagnosis code: E11.9, needs to check blood sugars 3 times a day 1 each 0  . Blood Pressure Monitoring (BLOOD PRESSURE CUFF) MISC Does not apply route 1 each 0  . calcium-vitamin D (OSCAL) 250-125 MG-UNIT per tablet Take 1 tablet by mouth daily.      . Cholecalciferol (REPLESTA) 50000 UNITS WAFR Take 50,000 Units by mouth every 7 (seven) days. On wednesdays    . denosumab (PROLIA) 60 MG/ML SOLN injection Inject 60 mg into the skin every 6 (six) months. Administer in upper arm, thigh, or abdomen  0  . EASY COMFORT PEN NEEDLES 31G X 8 MM MISC for use with lantus EVERY DAY  12  . gabapentin (NEURONTIN) 300 MG capsule TAKE 1 CAPSULE BY MOUTH THREE TIMES A DAY 90 capsule 2   . glucose blood (ACCU-CHEK GUIDE) test strip Use as instructed to check blood sugar one time a day 100 each 3  . hydrochlorothiazide (HYDRODIURIL) 25 MG tablet TAKE 1 TABLET BY MOUTH EVERY DAY 90 tablet 0  . Insulin Glargine (LANTUS) 100 UNIT/ML Solostar Pen Inject 10 Units into the skin daily at 10 pm. 15 mL 1  . levETIRAcetam (KEPPRA) 500 MG tablet Take 1 tablet (500 mg total) by mouth 2 (two) times daily. 60 tablet 2  . lisinopril (PRINIVIL,ZESTRIL) 10 MG tablet Take 1 tablet (10 mg total) by mouth daily. 30 tablet 2  . metFORMIN (GLUCOPHAGE-XR) 500 MG 24 hr tablet TAKE 1 TABLET BY MOUTH EVERY DAY WITH BREAKFAST 90 tablet 0  . metoprolol tartrate (LOPRESSOR) 50 MG tablet TAKE 1.5 TABLETS (75 MG TOTAL) BY MOUTH 2 (TWO) TIMES DAILY. 270 tablet 0  . mirtazapine (REMERON) 15 MG tablet TAKE 1 TABLET BY MOUTH EVERYDAY AT BEDTIME 30 tablet 2  . Multiple Vitamins-Minerals (ADULT ONE DAILY GUMMIES) CHEW Chew 2 each by mouth daily.    Marland Kitchen omeprazole (PRILOSEC) 20 MG capsule TAKE 1 CAPSULE BY MOUTH EVERY DAY 90 capsule 0  . potassium chloride (K-DUR,KLOR-CON) 10 MEQ tablet Take 1 tablet (10 mEq total) by mouth 2 (two) times daily. 60 tablet 2  . senna-docusate (SENOKOT-S) 8.6-50 MG tablet Take 2 tablets by mouth 2 (two)  times daily. 120 tablet 2  . silver sulfADIAZINE (SILVADENE) 1 % cream Apply 1 application topically as directed. Apply to the top of right foot.  Then wrap with Kerlix and Ace wrap.  To be changed every several days. 50 g 0  . traMADol (ULTRAM) 50 MG tablet Take 1 tablet (50 mg total) by mouth every 6 (six) hours as needed for moderate pain. 30 tablet 0  . Triamcinolone Acetonide (TRIAMCINOLONE 0.1 % CREAM : EUCERIN) CREA Apply 1 application topically 2 (two) times daily.  0   No current facility-administered medications for this visit.     REVIEW OF SYSTEMS:  [X]  denotes positive finding, [ ]  denotes negative finding Vascular    Leg swelling    Cardiac    Chest pain or chest  pressure:    Shortness of breath upon exertion:    Short of breath when lying flat:    Irregular heart rhythm:    Constitutional    Fever or chills:     PHYSICAL EXAM:   Vitals:   01/26/18 1207  BP: 115/76  Pulse: 80  Resp: 18  Temp: 97.6 F (36.4 C)  SpO2: 98%  Weight: 183 lb (83 kg)  Height: 5\' 9"  (1.753 m)    GENERAL: The patient is a well-nourished female, in no acute distress. The vital signs are documented above. CARDIOVASCULAR: There is a regular rate and rhythm. PULMONARY: There is good air exchange bilaterally without wheezing or rales. VENOUS EXAM: The patient does have some stasis dermatitis of the right leg with hyperpigmentation.  There is no significantly large dilated varicose veins.  DATA:   VENOUS REFLUX STUDY: I reviewed her previous venous reflux study which showed evidence of incompetence of the common femoral vein and also an anterior sensory saphenous vein on the right.  ARTERIAL DOPPLER STUDY: Patient also had a previous arterial Doppler study done which showed biphasic Doppler signals in both feet with an ABI of 100% on the right and 100% on the left.  MEDICAL ISSUES:   CHRONIC VENOUS INSUFFICIENCY: This patient does have some stasis dermatitis of the right leg.  She has CEAP clinical class IVb disease.  I do not think a laser ablation of her anterior accessory saphenous vein would significantly impact her venous hypertension.  We have discussed the importance of elevating her legs during the day rather than sitting in her wheelchair with her legs hanging down all day.  I written her for a knee-high compression stocking with a gradient of 15 to 20 mmHg.  I encouraged the family to keep the skin well lubricated.  Certainly I be happy to see her back in any time if her venous stasis progresses.  Waverly Ferrari [facility] [phone]

## 2018-01-27 ENCOUNTER — Ambulatory Visit (INDEPENDENT_AMBULATORY_CARE_PROVIDER_SITE_OTHER): Payer: Medicare Other | Admitting: Internal Medicine

## 2018-01-27 DIAGNOSIS — Z7984 Long term (current) use of oral hypoglycemic drugs: Secondary | ICD-10-CM | POA: Diagnosis not present

## 2018-01-27 DIAGNOSIS — M24531 Contracture, right wrist: Secondary | ICD-10-CM | POA: Diagnosis not present

## 2018-01-27 DIAGNOSIS — G8929 Other chronic pain: Secondary | ICD-10-CM

## 2018-01-27 DIAGNOSIS — I69351 Hemiplegia and hemiparesis following cerebral infarction affecting right dominant side: Secondary | ICD-10-CM

## 2018-01-27 DIAGNOSIS — E119 Type 2 diabetes mellitus without complications: Secondary | ICD-10-CM

## 2018-01-27 DIAGNOSIS — Z7982 Long term (current) use of aspirin: Secondary | ICD-10-CM

## 2018-01-27 DIAGNOSIS — Z87891 Personal history of nicotine dependence: Secondary | ICD-10-CM | POA: Diagnosis not present

## 2018-01-27 DIAGNOSIS — G8191 Hemiplegia, unspecified affecting right dominant side: Secondary | ICD-10-CM

## 2018-01-27 DIAGNOSIS — Z993 Dependence on wheelchair: Secondary | ICD-10-CM | POA: Diagnosis not present

## 2018-01-27 NOTE — Progress Notes (Signed)
   CC: Follow-up on type 2 diabetes  HPI:  Wanda [name] is a 62 y.o. female with history noted below that presents to the internal medicine clinic for follow-up on type 2 diabetes.  Please see problem based charting for the status of patient's chronic medical conditions.  Past Medical History:  Diagnosis Date  . ANEMIA-IRON DEFICIENCY 03/02/2006   H&H 8.5/25.9 5/09. On 5/13 12.8/39.7 with MCV 80.4.     Marland Kitchen. 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 Depression   . GERD (gastroesophageal reflux disease)   . Hypertension   . Osteopenia   . 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 SLE (systemic lupus erythematosus) (HCC)   . Stroke (HCC) 1991  . SVT (supraventricular tachycardia) (HCC)    a. 03/2013 with mildly elevated troponin at that time.  . Venous insufficiency (chronic) (peripheral)     Review of Systems:  Review of Systems  Respiratory: Negative for shortness of breath.   Cardiovascular: Negative for chest pain.  Gastrointestinal: Negative for abdominal pain, nausea and vomiting.  Neurological: Negative for dizziness.     Physical Exam:  Wt 193.6, BP 128/81, Pulse: 79, O2 97%, temp: 98.3  Physical Exam  Constitutional: She is well-developed, well-nourished, and in no distress.  Wheel chair  Cardiovascular: Normal rate, regular rhythm and normal heart sounds. Exam reveals no gallop and no friction rub.  No murmur heard. Pulmonary/Chest: Effort normal and breath sounds normal. No respiratory distress. She has no wheezes. She has no rales.  Musculoskeletal: She exhibits no edema.  Skin: Skin is warm and dry.     Assessment & Plan:   See encounters tab for problem based medical decision making.   Patient discussed with Dr. Cyndie ChimeGranfortuna

## 2018-01-27 NOTE — Patient Instructions (Signed)
[name],  It was a pleasure seeing you today.  Please discontinue your insulin and increase metformin 2 pills in the morning (1000mg ) and two pills in the evening (1000mg )  Please follow up in 3 months.  If you notice your sugars are increasing please call the clinic.

## 2018-01-28 MED ORDER — METFORMIN HCL 1000 MG PO TABS: 1000.0000 mg | ORAL_TABLET | Freq: Two times a day (BID) | ORAL | 3 refills | Status: DC

## 2018-01-28 NOTE — Assessment & Plan Note (Addendum)
HPI: Patient reports taking metformin 500mg  BID and lantus 5 units.  Last hemoglobin A1C was 6.5 on 8/15.  She denies any GI upset with the increase of metformin from previous office visit.  She did not have her meter but reports cbg range of 90-120 and denies any hypo or hyperglycemic episodes.  Assessment:  Controlled Type II diabetes mellitus CBGs appear to continue to be well controlled.  At this time will discontinue insulin and titrate up metformin to 1000 twice daily.  Will have patient follow-up in 3 months.  Told patient to call clinic in CBGs start to increase.  Plan -discontinue lantus 5 units -increase metformin to 1000mg  BID - follow up in one month

## 2018-01-28 NOTE — Assessment & Plan Note (Signed)
HPI: [facility] has [facility] to be [facility] out for hospital bed.  She states she currently has a hospital bed that is a semi-electric bed and she has a Nurse, adultHoyer lift.  She is trying to renew this equipment.  Assessment Patient has multiple health conditions that a normal bed but cannot accommodate.  She has right hemiparesis from CVA and requires her right side to be positioned in ways not feasible with a normal bed.  She requires frequent and immediate changes in body position which cannot be achieved within normal bed due to the contractures she has from her prior stroke.  She also has chronic pain in her right wrist and arm from the contractures.  A hospital bed will help with alleviating this pain by allowing these body parts to be positioned in ways not feasible with a normal bed.  Plan -DME order for hospital bed

## 2018-01-28 NOTE — Progress Notes (Signed)
Medicine attending: Medical history, presenting problems, physical findings, and medications, reviewed with resident physician Dr Jessica Hoffman on the day of the patient visit and I concur with her evaluation and management plan. 

## 2018-02-03 ENCOUNTER — Other Ambulatory Visit: Payer: Self-pay | Admitting: Internal Medicine

## 2018-02-03 ENCOUNTER — Ambulatory Visit
Admission: RE | Admit: 2018-02-03 | Discharge: 2018-02-03 | Disposition: A | Discharge status: Home / Self Care | Payer: Medicare Other | Source: Ambulatory Visit | Attending: Internal Medicine | Admitting: Internal Medicine

## 2018-02-03 DIAGNOSIS — Z1231 Encounter for screening mammogram for malignant neoplasm of breast: Secondary | ICD-10-CM

## 2018-02-05 ENCOUNTER — Other Ambulatory Visit: Payer: Self-pay | Admitting: Internal Medicine

## 2018-02-09 ENCOUNTER — Other Ambulatory Visit: Payer: Self-pay | Admitting: Internal Medicine

## 2018-02-09 DIAGNOSIS — I10 Essential (primary) hypertension: Secondary | ICD-10-CM

## 2018-03-10 ENCOUNTER — Other Ambulatory Visit: Payer: Self-pay | Admitting: Internal Medicine

## 2018-03-10 DIAGNOSIS — G40909 Epilepsy, unspecified, not intractable, without status epilepticus: Secondary | ICD-10-CM

## 2018-03-10 DIAGNOSIS — I471 Supraventricular tachycardia: Secondary | ICD-10-CM

## 2018-03-10 DIAGNOSIS — I10 Essential (primary) hypertension: Secondary | ICD-10-CM

## 2018-03-13 ENCOUNTER — Other Ambulatory Visit: Payer: Self-pay | Admitting: Internal Medicine

## 2018-03-13 DIAGNOSIS — E119 Type 2 diabetes mellitus without complications: Secondary | ICD-10-CM

## 2018-04-05 ENCOUNTER — Ambulatory Visit (INDEPENDENT_AMBULATORY_CARE_PROVIDER_SITE_OTHER): Payer: Medicare Other | Admitting: Sports Medicine

## 2018-04-05 ENCOUNTER — Encounter: Payer: Self-pay | Admitting: Sports Medicine

## 2018-04-05 DIAGNOSIS — R0989 Other specified symptoms and signs involving the circulatory and respiratory systems: Secondary | ICD-10-CM

## 2018-04-05 DIAGNOSIS — M79671 Pain in right foot: Secondary | ICD-10-CM | POA: Diagnosis not present

## 2018-04-05 DIAGNOSIS — M79672 Pain in left foot: Secondary | ICD-10-CM | POA: Diagnosis not present

## 2018-04-05 DIAGNOSIS — I739 Peripheral vascular disease, unspecified: Secondary | ICD-10-CM

## 2018-04-05 DIAGNOSIS — B351 Tinea unguium: Secondary | ICD-10-CM | POA: Diagnosis not present

## 2018-04-05 NOTE — Progress Notes (Signed)
Patient ID: Wanda [name], female   DOB: 13-May-1956, 62 y.o.   MRN: [id number] Subjective: Wanda [name] is a 62 y.o. female patient seen today in office with complaint of painful thickened and elongated toenails; unable to trim. Patient has history of stroke. Reports that she wants to know about options for shoes. Patient is assisted by son and family caregiver who help to report this history as well.   Patient Active Problem List   Diagnosis Date Noted  . Venous insufficiency (chronic) (peripheral) 07/16/2017  . Type II diabetes mellitus (HCC) 05/21/2017  . Weight loss 05/21/2017  . Diastolic dysfunction 08/31/2013  . Right hemiparesis (HCC) 07/18/2013  . SVT (supraventricular tachycardia) (HCC) 03/18/2013  . Seasonal allergies 09/21/2012  . Osteoporosis 09/21/2012  . Health care maintenance 11/27/2010  . INSOMNIA 09/19/2009  . Asthma 07/13/2006  . DEPRESSION 03/02/2006  . Essential hypertension 03/02/2006  . GERD 03/02/2006  . SEIZURE DISORDER 03/02/2006  . CVA (cerebral infarction) 03/02/2006    Current Outpatient Medications on File Prior to Visit  Medication Sig Dispense Refill  . ACCU-CHEK FASTCLIX LANCETS MISC Check blood sugar 3 times a day 102 each 5  . aspirin 81 MG tablet Take 81 mg by mouth daily.      Marland Kitchen atorvastatin (LIPITOR) 40 MG tablet Take 1 tablet (40 mg total) by mouth daily. 30 tablet 2  . baclofen (LIORESAL) 10 MG tablet TAKE 0.5 TABLETS (5 MG TOTAL) BY MOUTH 3 (THREE) TIMES DAILY. 135 tablet 1  . Blood Glucose Monitoring Suppl (ACCU-CHEK AVIVA) device Diagnosis code: E11.9, needs to check blood sugars 3 times a day 1 each 0  . Blood Pressure Monitoring (BLOOD PRESSURE CUFF) MISC Does not apply route 1 each 0  . calcium-vitamin D (OSCAL) 250-125 MG-UNIT per tablet Take 1 tablet by mouth daily.      . Cholecalciferol (REPLESTA) 50000 UNITS WAFR Take 50,000 Units by mouth every 7 (seven) days. On wednesdays    . denosumab (PROLIA) 60 MG/ML SOLN injection  Inject 60 mg into the skin every 6 (six) months. Administer in upper arm, thigh, or abdomen  0  . EASY COMFORT PEN NEEDLES 31G X 8 MM MISC for use with lantus EVERY DAY  12  . gabapentin (NEURONTIN) 300 MG capsule TAKE 1 CAPSULE BY MOUTH THREE TIMES A DAY 90 capsule 2  . glucose blood (ACCU-CHEK GUIDE) test strip Use as instructed to check blood sugar one time a day 100 each 3  . hydrochlorothiazide (HYDRODIURIL) 25 MG tablet TAKE 1 TABLET BY MOUTH EVERY DAY 90 tablet 0  . levETIRAcetam (KEPPRA) 500 MG tablet TAKE 1 TABLET BY MOUTH TWICE A DAY 180 tablet 0  . lisinopril (PRINIVIL,ZESTRIL) 10 MG tablet TAKE 1 TABLET BY MOUTH EVERY DAY 90 tablet 0  . metFORMIN (GLUCOPHAGE) 1000 MG tablet Take 1 tablet (1,000 mg total) by mouth 2 (two) times daily with a meal. 180 tablet 3  . metoprolol tartrate (LOPRESSOR) 50 MG tablet TAKE 1.5 TABLETS (75 MG TOTAL) BY MOUTH 2 (TWO) TIMES DAILY. 270 tablet 1  . mirtazapine (REMERON) 15 MG tablet TAKE 1 TABLET BY MOUTH EVERYDAY AT BEDTIME 90 tablet 1  . Multiple Vitamins-Minerals (ADULT ONE DAILY GUMMIES) CHEW Chew 2 each by mouth daily.    Marland Kitchen omeprazole (PRILOSEC) 20 MG capsule TAKE 1 CAPSULE BY MOUTH EVERY DAY 90 capsule 0  . potassium chloride (K-DUR,KLOR-CON) 10 MEQ tablet Take 1 tablet (10 mEq total) by mouth 2 (two) times daily. 60 tablet 2  .  senna-docusate (SENOKOT-S) 8.6-50 MG tablet Take 2 tablets by mouth 2 (two) times daily. 120 tablet 2  . silver sulfADIAZINE (SILVADENE) 1 % cream Apply 1 application topically as directed. Apply to the top of right foot.  Then wrap with Kerlix and Ace wrap.  To be changed every several days. 50 g 0  . traMADol (ULTRAM) 50 MG tablet Take 1 tablet (50 mg total) by mouth every 6 (six) hours as needed for moderate pain. 30 tablet 0  . Triamcinolone Acetonide (TRIAMCINOLONE 0.1 % CREAM : EUCERIN) CREA Apply 1 application topically 2 (two) times daily.  0   No current facility-administered medications on file prior to visit.      Allergies  Allergen Reactions  . Lasix [Furosemide] Swelling    mild L facial swelling  . Penicillins Swelling  . Sulfur Itching    Objective: Physical Exam  General: Well developed, nourished, no acute distress, awake, alert and oriented x 3  Vascular: Dorsalis pedis artery 0/4 bilateral, Posterior tibial artery 1/4 bilateral, skin temperature warm to warm proximal to distal bilateral lower extremities, no varicosities. Mild venous rash to right dorsal lateral foot with hyperpigmentation. No acute signs of infection, no pedal hair present bilateral. Trace edema right>left.  Neurological: Gross sensation present via light touch bilateral. Patient is hyper-sensitive bilateral.   Dermatological: Skin is warm, dry, and supple bilateral, Nails 1-10 are tender, long, thick, and discolored with moderate subungal debris, no webspace macerations present bilateral, no open lesions present bilateral, no callus/corns/hyperkeratotic tissue present bilateral. No signs of infection bilateral.  Musculoskeletal:Bunion and hammertoe boney deformities noted bilateral. Muscular strength within normal limits without pain on range of motion on left, 4/5 on right. No pain with calf compression bilateral.  Assessment and Plan:  Problem List Items Addressed This Visit    None    Visit Diagnoses    Dermatophytosis of nail    -  Primary   Diminished pulses in lower extremity       Foot pain, bilateral       PAD (peripheral artery disease) (HCC)         -Examined patient.  -Discussed treatment options for painful mycotic nails. -Mechanically debrided and reduced mycotic nails with sterile nail nipper and dremel nail file without incident. -Vascular results reviewed and continue with recs from vascular  -Advised patient on using soft shoes since she has swelling in her legs especially on the right -Patient to return in 3 months for follow up evaluation or sooner if symptoms worsen.  Asencion Islamitorya Abir Craine,  DPM

## 2018-04-06 ENCOUNTER — Other Ambulatory Visit: Payer: Self-pay | Admitting: Internal Medicine

## 2018-04-06 DIAGNOSIS — G8191 Hemiplegia, unspecified affecting right dominant side: Secondary | ICD-10-CM

## 2018-04-06 NOTE — Progress Notes (Signed)
Home care needed a new order for DME hospital bed for Wanda [name] with an attending NPI attached per [place].  Ordered as per Dr. Shela CommonsJ Hoffman's previous order.    Debe CoderEmily Calisa Luckenbaugh, MD

## 2018-04-07 NOTE — Telephone Encounter (Signed)
Next appt scheduled  12/12/ with PCP. 

## 2018-04-19 ENCOUNTER — Other Ambulatory Visit: Payer: Self-pay | Admitting: Internal Medicine

## 2018-04-26 ENCOUNTER — Other Ambulatory Visit: Payer: Self-pay | Admitting: Internal Medicine

## 2018-04-26 DIAGNOSIS — I10 Essential (primary) hypertension: Secondary | ICD-10-CM

## 2018-04-28 ENCOUNTER — Encounter: Payer: Self-pay | Admitting: Internal Medicine

## 2018-04-28 ENCOUNTER — Ambulatory Visit (INDEPENDENT_AMBULATORY_CARE_PROVIDER_SITE_OTHER): Payer: Medicare Other | Admitting: Internal Medicine

## 2018-04-28 ENCOUNTER — Other Ambulatory Visit: Payer: Self-pay

## 2018-04-28 VITALS — BP 137/70 | HR 85 | Temp 98.1°F | Wt 188.1 lb

## 2018-04-28 DIAGNOSIS — Z79899 Other long term (current) drug therapy: Secondary | ICD-10-CM

## 2018-04-28 DIAGNOSIS — Z87891 Personal history of nicotine dependence: Secondary | ICD-10-CM

## 2018-04-28 DIAGNOSIS — E119 Type 2 diabetes mellitus without complications: Secondary | ICD-10-CM

## 2018-04-28 DIAGNOSIS — I10 Essential (primary) hypertension: Secondary | ICD-10-CM

## 2018-04-28 DIAGNOSIS — R197 Diarrhea, unspecified: Secondary | ICD-10-CM

## 2018-04-28 DIAGNOSIS — Z7982 Long term (current) use of aspirin: Secondary | ICD-10-CM

## 2018-04-28 DIAGNOSIS — Z7984 Long term (current) use of oral hypoglycemic drugs: Secondary | ICD-10-CM

## 2018-04-28 LAB — POCT GLYCOSYLATED HEMOGLOBIN (HGB A1C): HEMOGLOBIN A1C: 6 % — AB (ref 4.0–5.6)

## 2018-04-28 LAB — GLUCOSE, CAPILLARY: Glucose-Capillary: 81 mg/dL (ref 70–99)

## 2018-04-28 MED ORDER — METFORMIN HCL ER 500 MG PO TB24: 500.0000 mg | ORAL_TABLET | Freq: Two times a day (BID) | ORAL | 1 refills | Status: DC

## 2018-04-28 NOTE — Patient Instructions (Signed)
[name],  It was a pleasure seeing you today.  Please start taking 1000mg  of metformin once a day.  With your new prescription you need to take 500mg  twice a day.

## 2018-04-28 NOTE — Progress Notes (Signed)
   CC: follow-up on type 2 diabetes and essential hypertension  HPI:  Wanda [name] is a [age] y.o. female with history noted below that presents to the internal medicine clinic for follow-up on type 2 diabetes and essential hypertension. Please see problem based charting for the status of patient's chronic medical conditions.  Past Medical History:  Diagnosis Date  . ANEMIA-IRON DEFICIENCY 03/02/2006   H&H 8.5/25.9 5/09. On 5/13 12.8/39.7 with MCV 80.4.     Marland Kitchen. 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 Depression   . GERD (gastroesophageal reflux disease)   . Hypertension   . Osteopenia   . 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 SLE (systemic lupus erythematosus) (HCC)   . Stroke (HCC) 1991  . SVT (supraventricular tachycardia) (HCC)    a. 03/2013 with mildly elevated troponin at that time.  . Venous insufficiency (chronic) (peripheral)     Review of Systems:  Review of Systems  Respiratory: Negative for shortness of breath.   Cardiovascular: Negative for chest pain.  Gastrointestinal: Positive for diarrhea.     Physical Exam:  Vitals:   04/28/18 1334  BP: 137/70  Pulse: 85  Temp: 98.1 F (36.7 C)  TempSrc: Oral  SpO2: 96%  Weight: 188 lb 1.6 oz (85.3 kg)   Physical Exam  Constitutional: She is well-developed, well-nourished, and in no distress.  Cardiovascular: Normal rate, regular rhythm and normal heart sounds. Exam reveals no gallop and no friction rub.  No murmur heard. Pulmonary/Chest: Effort normal and breath sounds normal. No respiratory distress. She has no wheezes. She has no rales.     Assessment & Plan:   See encounters tab for problem based medical decision making.     Patient discussed with Dr. Heide SparkNarendra

## 2018-04-29 NOTE — Progress Notes (Signed)
Internal Medicine Clinic Attending  Case discussed with Dr. Hoffman at the time of the visit.  We reviewed the resident's history and exam and pertinent patient test results.  I agree with the assessment, diagnosis, and plan of care documented in the resident's note.  

## 2018-04-29 NOTE — Assessment & Plan Note (Signed)
Assessment: Essential hypertension  Patient reports compliance with lisinopril 10mg  daily and metoprolol 50mg  daily.  Patient's blood pressure in office is 137/70, Stable and close to goal of <130/80  Plan Continue lisinopril and metoprolol

## 2018-04-29 NOTE — Assessment & Plan Note (Signed)
History of present illness:Patient is currently taking metformin 1000 twice a day and reports diarrhea daily since starting the medication despite titrating the medication over time to the current dose Assessment: controlled Type 2 diabetes Hemoglobin A1c is 6.0 today.  Will decrease the dose of metformin as patient's diabetes is well controlled but below goal of 6.5-7.0 and is having side effects from medication. Patient is due for eye exam and agreeable for referral.  Plan -metformin 500 mg extended release twice a day opthalmology referral

## 2018-05-16 DIAGNOSIS — H25813 Combined forms of age-related cataract, bilateral: Secondary | ICD-10-CM | POA: Diagnosis not present

## 2018-05-16 DIAGNOSIS — E119 Type 2 diabetes mellitus without complications: Secondary | ICD-10-CM | POA: Diagnosis not present

## 2018-05-16 LAB — HM DIABETES EYE EXAM

## 2018-05-18 ENCOUNTER — Other Ambulatory Visit: Payer: Self-pay | Admitting: Internal Medicine

## 2018-05-19 NOTE — Telephone Encounter (Signed)
Next appt scheduled 08/04/18 with PCP. 

## 2018-05-24 ENCOUNTER — Encounter: Payer: Self-pay | Admitting: *Deleted

## 2018-06-07 ENCOUNTER — Other Ambulatory Visit: Payer: Self-pay | Admitting: Internal Medicine

## 2018-06-20 ENCOUNTER — Other Ambulatory Visit: Payer: Self-pay | Admitting: Internal Medicine

## 2018-06-20 DIAGNOSIS — E119 Type 2 diabetes mellitus without complications: Secondary | ICD-10-CM

## 2018-06-20 DIAGNOSIS — G40909 Epilepsy, unspecified, not intractable, without status epilepticus: Secondary | ICD-10-CM

## 2018-07-12 ENCOUNTER — Ambulatory Visit: Payer: Medicare Other | Admitting: Sports Medicine

## 2018-07-15 ENCOUNTER — Other Ambulatory Visit: Payer: Self-pay | Admitting: Internal Medicine

## 2018-07-15 NOTE — Telephone Encounter (Signed)
Next appt scheduled  3/19 with PCP. 

## 2018-07-17 ENCOUNTER — Other Ambulatory Visit: Payer: Self-pay | Admitting: Internal Medicine

## 2018-07-19 ENCOUNTER — Ambulatory Visit: Payer: Medicare Other | Admitting: Sports Medicine

## 2018-07-29 ENCOUNTER — Other Ambulatory Visit: Payer: Self-pay | Admitting: Internal Medicine

## 2018-07-29 DIAGNOSIS — I10 Essential (primary) hypertension: Secondary | ICD-10-CM

## 2018-08-02 ENCOUNTER — Ambulatory Visit: Payer: Medicare Other | Admitting: Sports Medicine

## 2018-08-04 ENCOUNTER — Other Ambulatory Visit: Payer: Self-pay | Admitting: Pharmacist

## 2018-08-04 ENCOUNTER — Encounter: Payer: Medicare Other | Admitting: Internal Medicine

## 2018-08-04 DIAGNOSIS — Z0000 Encounter for general adult medical examination without abnormal findings: Secondary | ICD-10-CM

## 2018-08-04 MED ORDER — INFLUENZA VAC SPLIT QUAD 0.5 ML IM SUSY: 0.5000 mL | PREFILLED_SYRINGE | Freq: Once | INTRAMUSCULAR | 0 refills | Status: DC

## 2018-08-20 ENCOUNTER — Other Ambulatory Visit: Payer: Self-pay | Admitting: Internal Medicine

## 2018-08-22 NOTE — Telephone Encounter (Signed)
Pt is requesting 90 days supply. Thanks 

## 2018-08-26 ENCOUNTER — Other Ambulatory Visit: Payer: Self-pay | Admitting: Internal Medicine

## 2018-08-26 DIAGNOSIS — G40909 Epilepsy, unspecified, not intractable, without status epilepticus: Secondary | ICD-10-CM

## 2018-09-15 ENCOUNTER — Ambulatory Visit (INDEPENDENT_AMBULATORY_CARE_PROVIDER_SITE_OTHER): Payer: Medicare Other | Admitting: Internal Medicine

## 2018-09-15 ENCOUNTER — Other Ambulatory Visit: Payer: Self-pay

## 2018-09-15 DIAGNOSIS — Z87891 Personal history of nicotine dependence: Secondary | ICD-10-CM | POA: Diagnosis not present

## 2018-09-15 DIAGNOSIS — Z7982 Long term (current) use of aspirin: Secondary | ICD-10-CM | POA: Diagnosis not present

## 2018-09-15 DIAGNOSIS — Z79899 Other long term (current) drug therapy: Secondary | ICD-10-CM | POA: Diagnosis not present

## 2018-09-15 DIAGNOSIS — Z7984 Long term (current) use of oral hypoglycemic drugs: Secondary | ICD-10-CM

## 2018-09-15 DIAGNOSIS — E119 Type 2 diabetes mellitus without complications: Secondary | ICD-10-CM

## 2018-09-15 DIAGNOSIS — I10 Essential (primary) hypertension: Secondary | ICD-10-CM

## 2018-09-18 NOTE — Assessment & Plan Note (Signed)
HPI:  Patient currently takes lisinopril, hctz and metoprolol for blood pressure control.  She denies any side effects from the medication.  She has a nurse that comes to the house and checks her blood pressure.  Son states he will let [facility] know if it becomes elevated.  Assessment:  Essential hypertension Unable to obtain vitals due to virtual visit.  Currently stable on current management.  Plan -follow up in 2 months -continue current BP meds

## 2018-09-18 NOTE — Assessment & Plan Note (Signed)
HPI:  Patient currently takes metformin 500mg  twice a day without side effects or hypoglycemic episodes  Assessment:  Controlled type II diabetes Metformin was decreased at last visit from 1000mg  BID to 500mg  BID due to hemoglobin A1C of 6 and hypoglycemic episode.    Plan -obtain hemoglobin A1C at next visit - follow up in 2 months

## 2018-09-18 NOTE — Progress Notes (Signed)
  [facility] Internal Medicine Residency Telephone Encounter Continuity Care Appointment  HPI:   This telephone encounter was created for [name] on 09/15/2018 for the following purpose/cc follow up on type II diabetes mellitus.   Past Medical History:  Past Medical History:  Diagnosis Date  . ANEMIA-IRON DEFICIENCY 03/02/2006   H&H 8.5/25.9 5/09. On 5/13 12.8/39.7 with MCV 80.4.     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 Depression   . GERD (gastroesophageal reflux disease)   . Hypertension   . Osteopenia   . 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 SLE (systemic lupus erythematosus) (HCC)   . Stroke (HCC) 1991  . SVT (supraventricular tachycardia) (HCC)    a. 03/2013 with mildly elevated troponin at that time.  . Venous insufficiency (chronic) (peripheral)       ROS:  Review of Systems  Constitutional: Negative for weight loss.  Respiratory: Negative for shortness of breath.   Cardiovascular: Negative for chest pain.  Gastrointestinal: Negative for abdominal pain, nausea and vomiting.     Assessment / Plan / Recommendations:   Please see A&P under problem oriented charting for assessment of the patient's acute and chronic medical conditions.   As always, pt is advised that if symptoms worsen or new symptoms arise, they should go to an urgent care facility or to to ER for further evaluation.   Consent and Medical Decision Making:   Patient discussed with Dr. Heide Spark  This is a telephone encounter between Wanda [name] and Aldean Baker on 09/15/2018 for follow up on type II diabetes mellitus. The visit was conducted with the patient located at home and Aldean Baker at [facility]. The patient's identity was confirmed using their DOB. The patient has consented to being evaluated through a telephone encounter and also consented for her son to help with history  and understands the associated risks (an examination cannot be done and the patient may need to come in for an appointment) / benefits (allows the patient to remain at home, decreasing exposure to coronavirus). I personally spent on medical discussion.

## 2018-09-19 NOTE — Progress Notes (Signed)
Internal Medicine Clinic Attending  Case discussed with Dr. Hoffman at the time of the visit.  We reviewed the resident's history and exam and pertinent patient test results.  I agree with the assessment, diagnosis, and plan of care documented in the resident's note.  

## 2018-09-20 ENCOUNTER — Ambulatory Visit: Payer: Medicare Other | Admitting: Sports Medicine

## 2018-09-22 ENCOUNTER — Other Ambulatory Visit: Payer: Self-pay | Admitting: Internal Medicine

## 2018-09-22 DIAGNOSIS — I10 Essential (primary) hypertension: Secondary | ICD-10-CM

## 2018-09-22 DIAGNOSIS — I471 Supraventricular tachycardia: Secondary | ICD-10-CM

## 2018-11-02 ENCOUNTER — Other Ambulatory Visit: Payer: Self-pay | Admitting: Internal Medicine

## 2018-11-02 DIAGNOSIS — I10 Essential (primary) hypertension: Secondary | ICD-10-CM

## 2018-11-14 ENCOUNTER — Encounter: Payer: Self-pay | Admitting: *Deleted

## 2018-12-15 ENCOUNTER — Other Ambulatory Visit: Payer: Self-pay | Admitting: *Deleted

## 2018-12-15 DIAGNOSIS — G40909 Epilepsy, unspecified, not intractable, without status epilepticus: Secondary | ICD-10-CM

## 2018-12-15 MED ORDER — LEVETIRACETAM 500 MG PO TABS: 500.0000 mg | ORAL_TABLET | Freq: Two times a day (BID) | ORAL | 0 refills | Status: DC

## 2018-12-15 NOTE — Telephone Encounter (Signed)
Last visit 09/15/18 (Telehealth) No future appts scheduled Will send appt request to front office.Regenia Skeeter, Lotoya Casella Cassady7/30/202011:26 AM

## 2018-12-23 ENCOUNTER — Other Ambulatory Visit: Payer: Self-pay | Admitting: *Deleted

## 2018-12-23 DIAGNOSIS — I10 Essential (primary) hypertension: Secondary | ICD-10-CM

## 2018-12-23 MED ORDER — HYDROCHLOROTHIAZIDE 25 MG PO TABS: 25.0000 mg | ORAL_TABLET | Freq: Every day | ORAL | 0 refills | Status: DC

## 2018-12-23 NOTE — Telephone Encounter (Signed)
Next appt scheduled 9/3 with PCP. 

## 2019-01-10 ENCOUNTER — Other Ambulatory Visit: Payer: Self-pay | Admitting: *Deleted

## 2019-01-10 MED ORDER — MIRTAZAPINE 15 MG PO TABS: ORAL_TABLET | ORAL | 1 refills | Status: DC

## 2019-01-10 NOTE — Telephone Encounter (Signed)
Next appt scheduled 9/3 with PCP. 

## 2019-01-12 ENCOUNTER — Other Ambulatory Visit: Payer: Self-pay | Admitting: *Deleted

## 2019-01-12 MED ORDER — LISINOPRIL 10 MG PO TABS: 10.0000 mg | ORAL_TABLET | Freq: Every day | ORAL | 3 refills | Status: AC

## 2019-01-12 NOTE — Telephone Encounter (Signed)
Next appt scheduled 9/3 with PCP. 

## 2019-01-19 ENCOUNTER — Other Ambulatory Visit: Payer: Self-pay

## 2019-01-19 ENCOUNTER — Ambulatory Visit (INDEPENDENT_AMBULATORY_CARE_PROVIDER_SITE_OTHER): Payer: Medicare Other | Admitting: Internal Medicine

## 2019-01-19 ENCOUNTER — Encounter: Payer: Self-pay | Admitting: Internal Medicine

## 2019-01-19 VITALS — BP 145/92 | HR 73 | Temp 98.9°F | Wt 199.4 lb

## 2019-01-19 DIAGNOSIS — I69351 Hemiplegia and hemiparesis following cerebral infarction affecting right dominant side: Secondary | ICD-10-CM | POA: Diagnosis not present

## 2019-01-19 DIAGNOSIS — Z7951 Long term (current) use of inhaled steroids: Secondary | ICD-10-CM | POA: Diagnosis not present

## 2019-01-19 DIAGNOSIS — E119 Type 2 diabetes mellitus without complications: Secondary | ICD-10-CM

## 2019-01-19 DIAGNOSIS — Z79899 Other long term (current) drug therapy: Secondary | ICD-10-CM | POA: Diagnosis not present

## 2019-01-19 DIAGNOSIS — Z7984 Long term (current) use of oral hypoglycemic drugs: Secondary | ICD-10-CM | POA: Diagnosis not present

## 2019-01-19 DIAGNOSIS — I10 Essential (primary) hypertension: Secondary | ICD-10-CM

## 2019-01-19 DIAGNOSIS — Z0000 Encounter for general adult medical examination without abnormal findings: Secondary | ICD-10-CM

## 2019-01-19 DIAGNOSIS — J45909 Unspecified asthma, uncomplicated: Secondary | ICD-10-CM

## 2019-01-19 DIAGNOSIS — I69320 Aphasia following cerebral infarction: Secondary | ICD-10-CM | POA: Diagnosis not present

## 2019-01-19 LAB — POCT GLYCOSYLATED HEMOGLOBIN (HGB A1C): Hemoglobin A1C: 6.3 % — AB (ref 4.0–5.6)

## 2019-01-19 LAB — GLUCOSE, CAPILLARY: Glucose-Capillary: 92 mg/dL (ref 70–99)

## 2019-01-19 MED ORDER — ALBUTEROL SULFATE (2.5 MG/3ML) 0.083% IN NEBU: 2.5000 mg | INHALATION_SOLUTION | Freq: Once | RESPIRATORY_TRACT | Status: AC

## 2019-01-19 MED ORDER — BENZONATATE 100 MG PO CAPS: 100.0000 mg | ORAL_CAPSULE | Freq: Four times a day (QID) | ORAL | 1 refills | Status: AC | PRN

## 2019-01-19 MED ORDER — ALBUTEROL SULFATE HFA 108 (90 BASE) MCG/ACT IN AERS: 2.0000 | INHALATION_SPRAY | Freq: Four times a day (QID) | RESPIRATORY_TRACT | 1 refills | Status: DC | PRN

## 2019-01-19 NOTE — Patient Instructions (Addendum)
Wanda [name],  It was nice meeting you today! I hope the breathing treatment in the office helps your shortness of breath. I have sent a cough suppressant and inhaler to [facility] as well. If you develop fevers, worsening shortness of breath, or the cough gets worse or doesn't go away, then please call the office.  Continue taking your blood pressure medication as you were, and we will follow-up your pressures at the next visit. I will call you if anything is abnormal on your lab results from today.  Please follow-up with me in 3 months.

## 2019-01-19 NOTE — Assessment & Plan Note (Signed)
Pt having cough. Endorses feeling like she is wheezing. Son states it's been going on for 1 week. Denies fevers, chills, or shortness of breath at home. No sick contacts at home. Pt endorses having asthma for a long time, though has not needed an inhaler for awhile. She is not currently on any rescue or maintenance inhalers.  Assessment - Bronchitis Low suspicion for pneumonia at this time without fever or focal findings on ascultation.  - will do a duoneb treatment in the clinic today - prescribed albuterol inhaler for wheezing and tessalon for cough - instructed pt and son to have her call the office if she begins having fevers, worsening shortness of breath, or worsening/unremitting cough

## 2019-01-19 NOTE — Progress Notes (Signed)
   CC: follow-up for type II diabetes  HPI:  Wanda [name] is a 63 y.o. F with significant PMH as stated below. Please see problem based charting for follow-up on pt's chronic conditions.  Past Medical History:  Diagnosis Date  . ANEMIA-IRON DEFICIENCY 03/02/2006   H&H 8.5/25.9 5/09. On 5/13 12.8/39.7 with MCV 80.4.     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 Depression   . GERD (gastroesophageal reflux disease)   . Hypertension   . Osteopenia   . 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 SLE (systemic lupus erythematosus) ([place])   . Stroke ([place]) 1991  . SVT (supraventricular tachycardia) ([place])    a. 03/2013 with mildly elevated troponin at that time.  . Venous insufficiency (chronic) (peripheral)    Review of Systems:  Review of Systems  Constitutional: Negative for chills and fever.  Eyes: Negative.   Respiratory: Positive for cough and wheezing. Negative for sputum production and shortness of breath.   Cardiovascular: Negative for chest pain.  Neurological: Negative for headaches.  Endo/Heme/Allergies: Negative for polydipsia.   Physical Exam:  Vitals:   01/19/19 1111  BP: (!) 189/84  Pulse: 73  Temp: 98.9 F (37.2 C)  TempSrc: Oral  SpO2: 97%  Weight: 199 lb 6.4 oz (90.4 kg)   Physical Exam Vitals signs and nursing note reviewed.  Constitutional:      General: She is not in acute distress.    Comments: Aphasic, though will respond minimally to questions.  Cardiovascular:     Rate and Rhythm: Normal rate and regular rhythm.     Heart sounds: Normal heart sounds.  Pulmonary:     Effort: Pulmonary effort is normal. No respiratory distress.     Breath sounds: Wheezing present.  Neurological:     Mental Status: She is alert.     Comments: Chronic R sided-contractures and hemiplegia.    Assessment & Plan:   See Encounters Tab for problem based charting.   Patient seen with Dr. Philipp Ovens.

## 2019-01-19 NOTE — Assessment & Plan Note (Signed)
Pt has been taking metformin 500mg  BID. Denies any medication side effects.  Assessment - Type II DM, well-controlled - A1c today 6.3 - will continue current regimen and continue to monitor

## 2019-01-19 NOTE — Assessment & Plan Note (Signed)
Pt taking lisinopril, HCTZ, and metoprolol for HTN. Endorses taking these medications every day, and denies any side effects or issues getting her medications. Pt's BP today initially 189/84 with automatic cuff. On manual recheck BP 145/92. Her blood pressure over the the last two visits has been above her goal of less than 130/80.  BP Readings from Last 3 Encounters:  01/19/19 (!) 145/92  04/28/18 137/70  01/26/18 115/76   Assessment - Essential HTN  - will continue current medication regimen - continue to monitor - can consider increasing lisinopril from 10mg  to 20mg  in the future if BP readings remain high

## 2019-01-19 NOTE — Assessment & Plan Note (Signed)
Pt due for colonoscopy. She did the FIT testing last year and is interested in doing that again this year. Has no history of positive FIT tests.  Plan - FIT test ordered - pt agreed to flu shot today

## 2019-01-20 LAB — BMP8+ANION GAP
Anion Gap: 15 mmol/L (ref 10.0–18.0)
BUN/Creatinine Ratio: 9 — ABNORMAL LOW (ref 12–28)
BUN: 5 mg/dL — ABNORMAL LOW (ref 8–27)
CO2: 31 mmol/L — ABNORMAL HIGH (ref 20–29)
Calcium: 9.8 mg/dL (ref 8.7–10.3)
Chloride: 87 mmol/L — ABNORMAL LOW (ref 96–106)
Creatinine, Ser: 0.56 mg/dL — ABNORMAL LOW (ref 0.57–1.00)
GFR calc Af Amer: 117 mL/min/{1.73_m2} (ref >59)
GFR calc non Af Amer: 102 mL/min/{1.73_m2} (ref >59)
Glucose: 98 mg/dL (ref 65–99)
Potassium: 4.1 mmol/L (ref 3.5–5.2)
Sodium: 133 mmol/L — ABNORMAL LOW (ref 134–144)

## 2019-01-20 NOTE — Progress Notes (Signed)
Internal Medicine Clinic Attending  I saw and evaluated the patient.  I personally confirmed the key portions of the history and exam documented by Dr. Jones and I reviewed pertinent patient test results.  The assessment, diagnosis, and plan were formulated together and I agree with the documentation in the resident's note.     

## 2019-01-20 NOTE — Addendum Note (Signed)
Addended by: Jodean Lima on: 01/20/2019 02:16 PM   Modules accepted: Level of Service

## 2019-01-24 DIAGNOSIS — Z0000 Encounter for general adult medical examination without abnormal findings: Secondary | ICD-10-CM | POA: Diagnosis not present

## 2019-01-31 ENCOUNTER — Other Ambulatory Visit: Payer: Medicare Other

## 2019-01-31 ENCOUNTER — Other Ambulatory Visit: Payer: Self-pay

## 2019-01-31 DIAGNOSIS — Z0000 Encounter for general adult medical examination without abnormal findings: Secondary | ICD-10-CM

## 2019-02-01 LAB — FECAL OCCULT BLOOD, IMMUNOCHEMICAL: Fecal Occult Bld: NEGATIVE

## 2019-02-16 ENCOUNTER — Other Ambulatory Visit: Payer: Self-pay | Admitting: *Deleted

## 2019-02-16 MED ORDER — BACLOFEN 10 MG PO TABS: 5.0000 mg | ORAL_TABLET | Freq: Three times a day (TID) | ORAL | 1 refills | Status: AC

## 2019-02-28 ENCOUNTER — Other Ambulatory Visit: Payer: Self-pay | Admitting: *Deleted

## 2019-03-01 MED ORDER — GABAPENTIN 300 MG PO CAPS: ORAL_CAPSULE | ORAL | 2 refills | Status: AC

## 2019-03-08 ENCOUNTER — Other Ambulatory Visit: Payer: Self-pay | Admitting: Internal Medicine

## 2019-03-08 DIAGNOSIS — G40909 Epilepsy, unspecified, not intractable, without status epilepticus: Secondary | ICD-10-CM

## 2019-03-13 ENCOUNTER — Other Ambulatory Visit: Payer: Self-pay | Admitting: *Deleted

## 2019-03-13 DIAGNOSIS — I10 Essential (primary) hypertension: Secondary | ICD-10-CM

## 2019-03-14 ENCOUNTER — Other Ambulatory Visit: Payer: Self-pay | Admitting: Internal Medicine

## 2019-03-14 DIAGNOSIS — J45909 Unspecified asthma, uncomplicated: Secondary | ICD-10-CM

## 2019-03-14 MED ORDER — HYDROCHLOROTHIAZIDE 25 MG PO TABS: 25.0000 mg | ORAL_TABLET | Freq: Every day | ORAL | 2 refills | Status: AC

## 2019-03-31 ENCOUNTER — Telehealth: Payer: Self-pay | Admitting: Internal Medicine

## 2019-03-31 NOTE — Telephone Encounter (Signed)
Pt son needs a copy of pt plan of care for Social Services, pt contact [phone]

## 2019-03-31 NOTE — Telephone Encounter (Signed)
RTC to patient and spoke with pt's son, Corene Cornea.  States he needs CAP recertification and CAP is asking for POC.  Son states Pt was certified w/ CAP through [facility] last certification period. Will forward to PCP. SChaplin, RN,BSN

## 2019-04-04 NOTE — Telephone Encounter (Signed)
Have left VM for Wanda [name] with [facility] CAP [facility] to have CAP recert form faxed to [place]. Hubbard Hartshorn, BSN, RN-BC

## 2019-04-12 NOTE — Telephone Encounter (Signed)
Spoke with Alicia Amel at [facility] CAP who states CAP recert would be done through patient's Case Worker, Teachey. Spoke with M[place]. States she provided Plan Of Care to re-certify medicaid. It was due by 04/07/2019. She is awaiting review and approval from Medicaid. Nothing needed by PCP at this time. Hubbard Hartshorn, BSN, RN-BC

## 2019-05-03 ENCOUNTER — Other Ambulatory Visit: Payer: Self-pay | Admitting: *Deleted

## 2019-05-03 DIAGNOSIS — I10 Essential (primary) hypertension: Secondary | ICD-10-CM

## 2019-05-03 DIAGNOSIS — I471 Supraventricular tachycardia: Secondary | ICD-10-CM

## 2019-05-05 MED ORDER — METOPROLOL TARTRATE 50 MG PO TABS: 75.0000 mg | ORAL_TABLET | Freq: Two times a day (BID) | ORAL | 1 refills | Status: AC

## 2019-05-05 MED ORDER — ATORVASTATIN CALCIUM 40 MG PO TABS: 40.0000 mg | ORAL_TABLET | Freq: Every day | ORAL | 3 refills | Status: AC

## 2019-05-15 ENCOUNTER — Other Ambulatory Visit: Payer: Self-pay | Admitting: Internal Medicine

## 2019-05-16 ENCOUNTER — Other Ambulatory Visit: Payer: Self-pay | Admitting: Internal Medicine

## 2019-05-16 NOTE — Telephone Encounter (Signed)
Needs refill on metFORMIN (GLUCOPHAGE-XR) 500 MG 24 hr tablet  gabapentin (NEURONTIN) 300 MG capsule ;pt contact [phone]   [id number] - [place], [place] [address]  Pt has been out of metformin for 4days

## 2019-05-16 NOTE — Telephone Encounter (Signed)
Pt has refills of both at pharmacy, metformin sent today, gabapentin sent 10/14 for 90 day plus 1 additional

## 2019-05-20 ENCOUNTER — Other Ambulatory Visit: Payer: Self-pay | Admitting: Internal Medicine

## 2019-05-20 DIAGNOSIS — J45909 Unspecified asthma, uncomplicated: Secondary | ICD-10-CM

## 2019-06-27 ENCOUNTER — Other Ambulatory Visit: Payer: Self-pay | Admitting: Internal Medicine

## 2019-07-05 IMAGING — MG DIGITAL SCREENING UNILATERAL LEFT MAMMOGRAM WITH CAD AND TOMO
4 series · 4 of 12 positions shown · non-contrast
Comparison: Previous exam(s).

CLINICAL DATA: Screening. Mammographic images of the right breast
could not be obtained due to severe mobility limitations and pain.
Two technologists attempted to position the patient but were
unsuccessful.

EXAM:
DIGITAL SCREENING UNILATERAL LEFT MAMMOGRAM WITH CAD AND TOMO

[L MLO synth-2D]
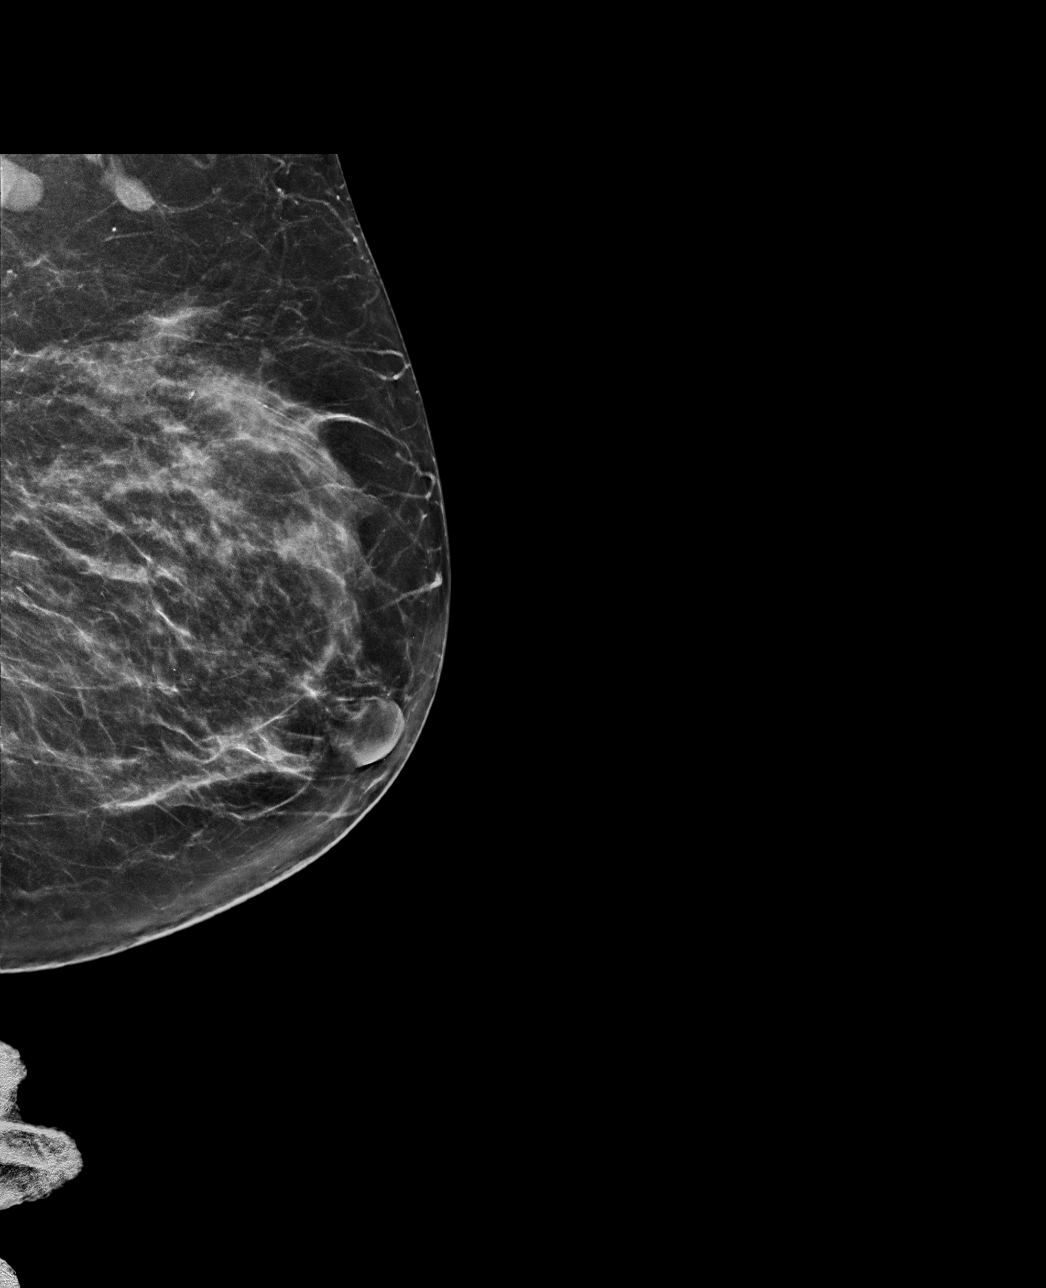

[L CC synth-2D]
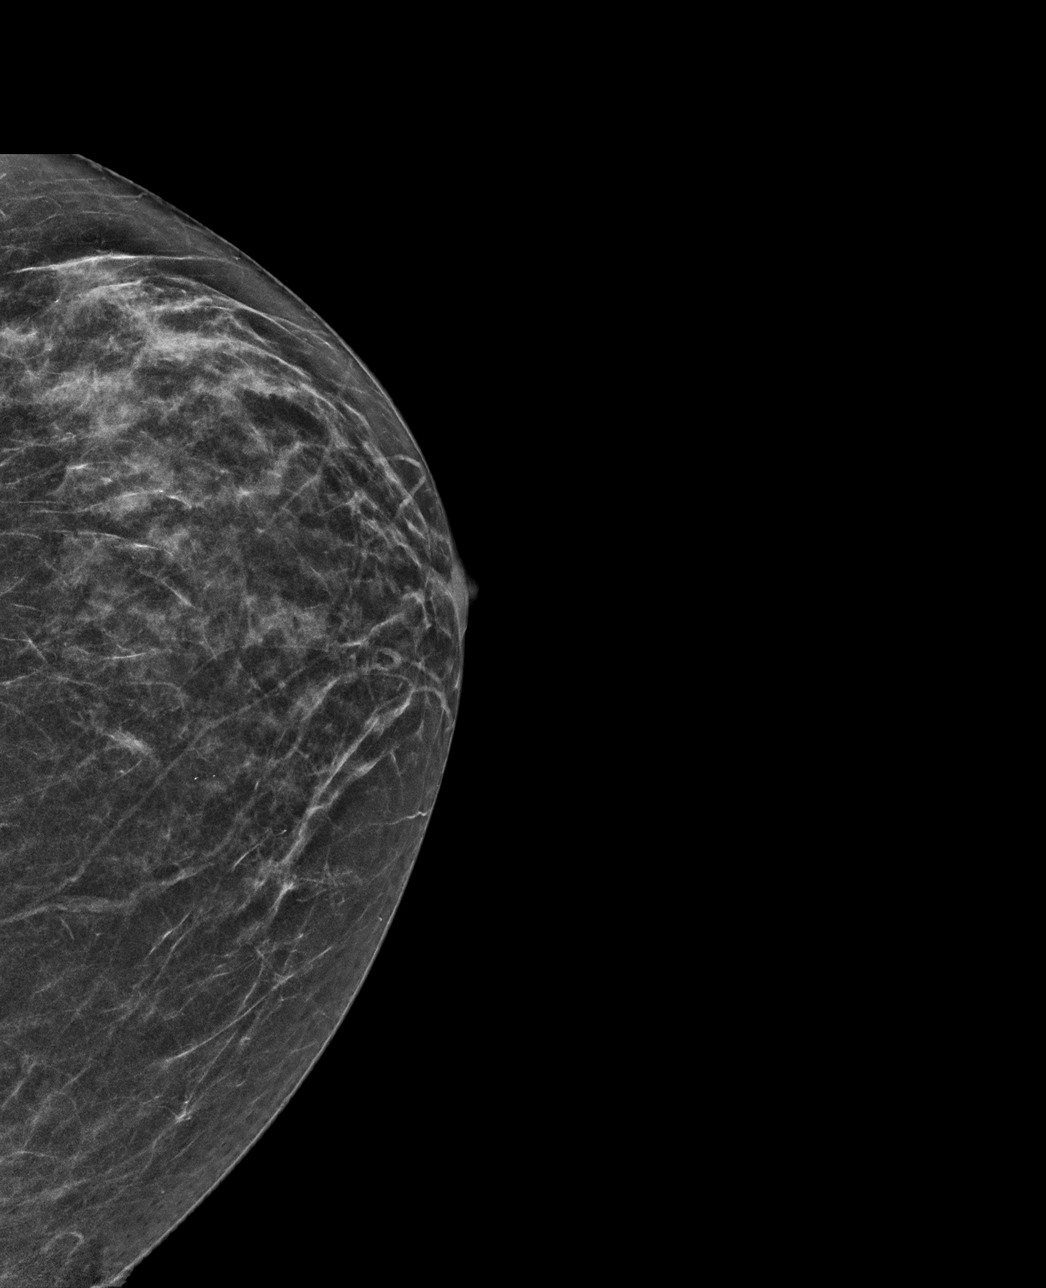

[L CC tomo · tomo slice 29/56.0]
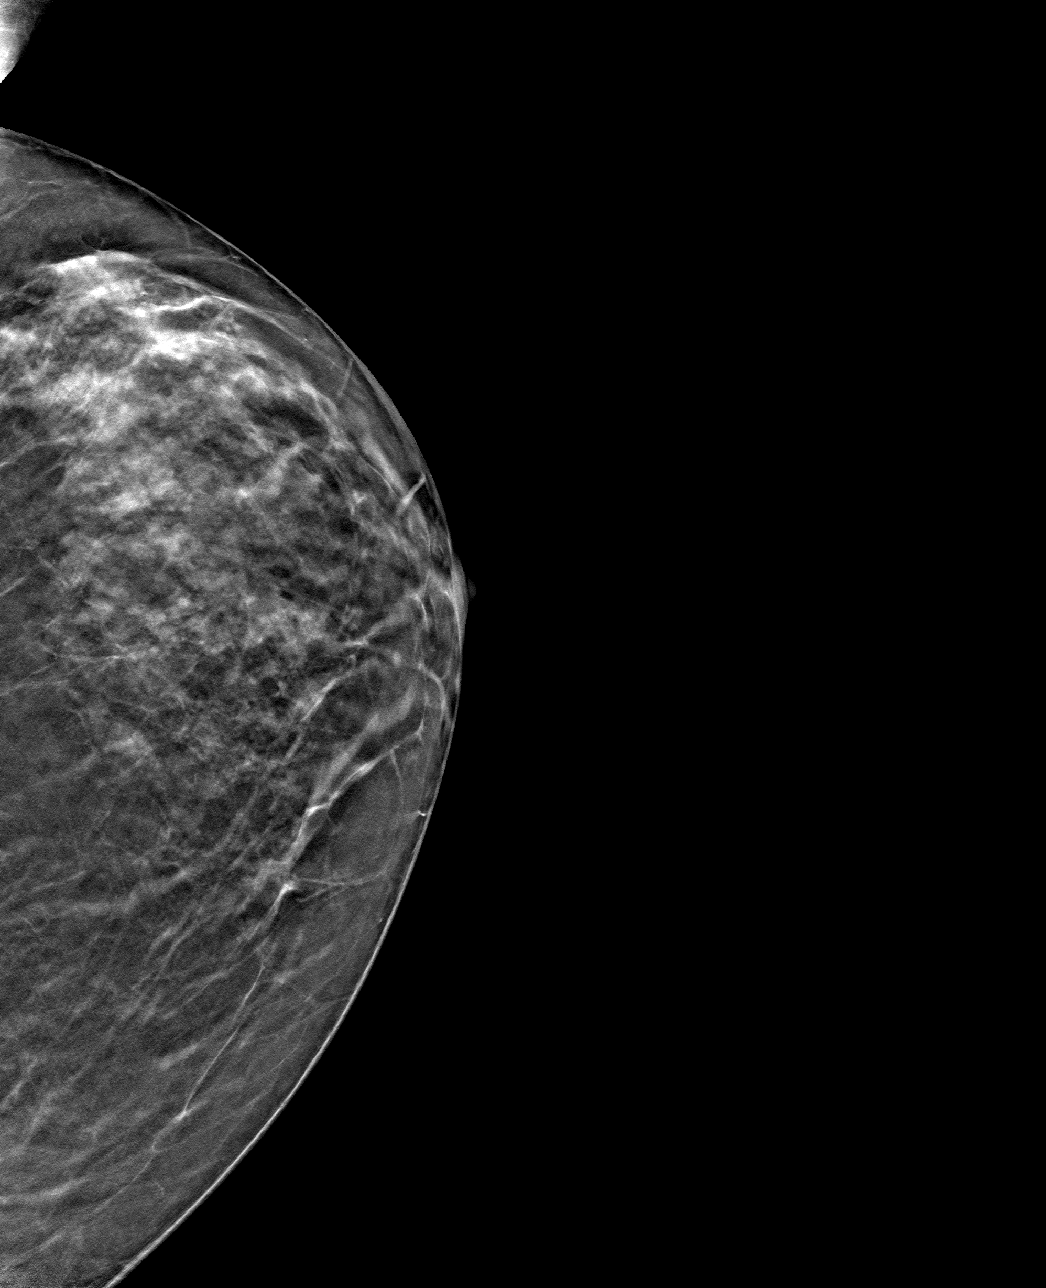

[L MLO tomo · tomo slice 37/74.0]
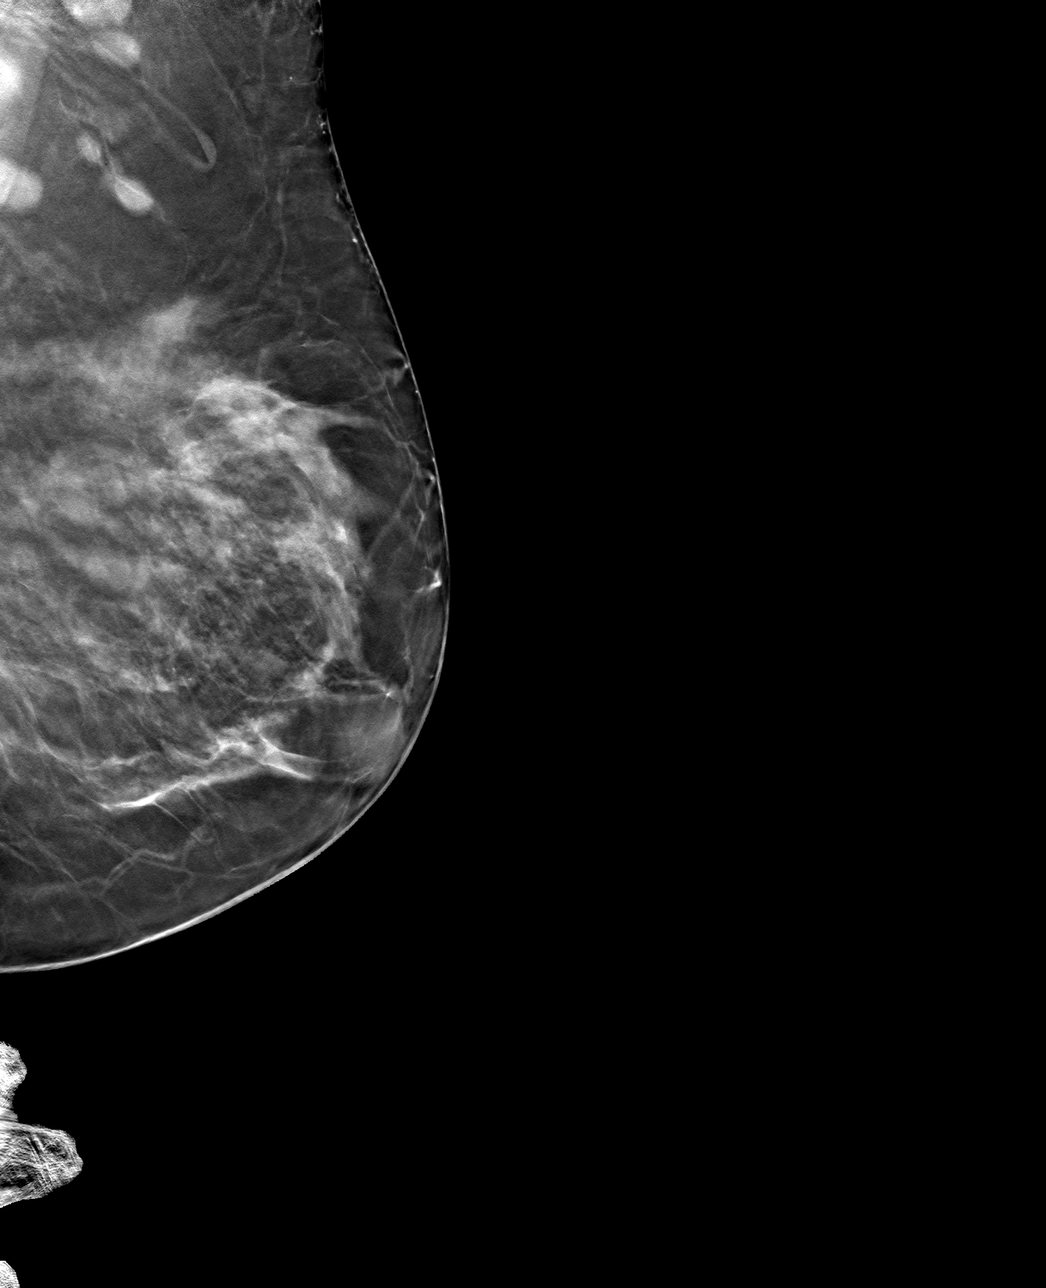

[4 of 12 positions shown; findings below may reference images not displayed]

ACR Breast Density Category b: There are scattered areas of
fibroglandular density.
FINDINGS: There are no findings suspicious for malignancy. Images were
processed with CAD.
IMPRESSION: No mammographic evidence of malignancy on the left. A result letter
of this screening mammogram will be mailed directly to the patient.

Evaluation of the right breast was not possible due to severe
mobility limitations.

RECOMMENDATION:
Screening mammogram in one year. (Code:3J-2-XFU)

BI-RADS CATEGORY  1: Negative.

## 2019-07-16 DIAGNOSIS — I469 Cardiac arrest, cause unspecified: Secondary | ICD-10-CM | POA: Diagnosis not present

## 2019-07-16 DIAGNOSIS — R404 Transient alteration of awareness: Secondary | ICD-10-CM | POA: Diagnosis not present

## 2019-07-16 DIAGNOSIS — I499 Cardiac arrhythmia, unspecified: Secondary | ICD-10-CM | POA: Diagnosis not present

## 2019-07-16 DIAGNOSIS — E1165 Type 2 diabetes mellitus with hyperglycemia: Secondary | ICD-10-CM | POA: Diagnosis not present

## 2019-07-17 DIAGNOSIS — 419620001 Death: Secondary | SNOMED CT | POA: Diagnosis not present

## 2019-07-17 DEATH — deceased

## 2019-07-25 ENCOUNTER — Ambulatory Visit: Payer: Medicare Other | Admitting: Sports Medicine
